# Patient Record
Sex: Male | Born: 1950 | Race: White | Hispanic: No | Marital: Married | Smoking: Never smoker
Health system: Southern US, Community
[De-identification: ages and names within clinical notes are randomized; demographics above are authoritative.]

## PROBLEM LIST (undated history)

## (undated) DIAGNOSIS — G709 Myoneural disorder, unspecified: Secondary | ICD-10-CM

## (undated) DIAGNOSIS — I10 Essential (primary) hypertension: Secondary | ICD-10-CM

## (undated) DIAGNOSIS — E871 Hypo-osmolality and hyponatremia: Secondary | ICD-10-CM

## (undated) DIAGNOSIS — M1990 Unspecified osteoarthritis, unspecified site: Secondary | ICD-10-CM

## (undated) DIAGNOSIS — M21371 Foot drop, right foot: Secondary | ICD-10-CM

## (undated) DIAGNOSIS — G912 (Idiopathic) normal pressure hydrocephalus: Secondary | ICD-10-CM

## (undated) DIAGNOSIS — E7800 Pure hypercholesterolemia, unspecified: Secondary | ICD-10-CM

## (undated) DIAGNOSIS — M21372 Foot drop, left foot: Secondary | ICD-10-CM

## (undated) DIAGNOSIS — H269 Unspecified cataract: Secondary | ICD-10-CM

## (undated) DIAGNOSIS — K219 Gastro-esophageal reflux disease without esophagitis: Secondary | ICD-10-CM

## (undated) DIAGNOSIS — N400 Enlarged prostate without lower urinary tract symptoms: Secondary | ICD-10-CM

## (undated) DIAGNOSIS — C9110 Chronic lymphocytic leukemia of B-cell type not having achieved remission: Secondary | ICD-10-CM

## (undated) DIAGNOSIS — E109 Type 1 diabetes mellitus without complications: Secondary | ICD-10-CM

## (undated) DIAGNOSIS — E119 Type 2 diabetes mellitus without complications: Secondary | ICD-10-CM

## (undated) HISTORY — PX: CYSTOSCOPY WITH INSERTION OF UROLIFT: SHX6678

## (undated) HISTORY — PX: BACK SURGERY: SHX140

## (undated) HISTORY — PX: EYE SURGERY: SHX253

## (undated) HISTORY — PX: LUMBAR FUSION: SHX111

## (undated) HISTORY — DX: Unspecified cataract: H26.9

## (undated) HISTORY — PX: APPENDECTOMY: SHX54

## (undated) HISTORY — DX: Myoneural disorder, unspecified: G70.9

## (undated) HISTORY — PX: SPINE SURGERY: SHX786

## (undated) HISTORY — PX: CATARACT EXTRACTION: SUR2

## (undated) HISTORY — PX: BRAIN SURGERY: SHX531

## (undated) HISTORY — PX: CYST REMOVAL NECK: SHX6281

## (undated) HISTORY — PX: VENTRICULOPERITONEAL SHUNT: SHX204

---

## 2000-09-17 ENCOUNTER — Encounter: Admission: RE | Admit: 2000-09-17 | Discharge: 2000-12-16 | Payer: Self-pay | Admitting: *Deleted

## 2001-06-26 ENCOUNTER — Emergency Department (HOSPITAL_COMMUNITY): Admission: EM | Admit: 2001-06-26 | Discharge: 2001-06-27 | Payer: Self-pay | Admitting: Emergency Medicine

## 2002-09-04 ENCOUNTER — Encounter: Payer: Self-pay | Admitting: Emergency Medicine

## 2002-09-04 ENCOUNTER — Emergency Department (HOSPITAL_COMMUNITY): Admission: EM | Admit: 2002-09-04 | Discharge: 2002-09-05 | Payer: Self-pay | Admitting: Emergency Medicine

## 2002-09-27 ENCOUNTER — Encounter: Admission: RE | Admit: 2002-09-27 | Discharge: 2002-12-26 | Payer: Self-pay | Admitting: Family Medicine

## 2002-10-09 ENCOUNTER — Ambulatory Visit (HOSPITAL_COMMUNITY): Admission: RE | Admit: 2002-10-09 | Discharge: 2002-10-09 | Payer: Self-pay | Admitting: Neurological Surgery

## 2002-10-09 ENCOUNTER — Encounter: Payer: Self-pay | Admitting: Neurological Surgery

## 2003-01-09 ENCOUNTER — Ambulatory Visit (HOSPITAL_COMMUNITY): Admission: RE | Admit: 2003-01-09 | Discharge: 2003-01-09 | Payer: Self-pay | Admitting: Neurological Surgery

## 2003-01-09 ENCOUNTER — Encounter: Payer: Self-pay | Admitting: Neurological Surgery

## 2004-04-20 DIAGNOSIS — G912 (Idiopathic) normal pressure hydrocephalus: Secondary | ICD-10-CM

## 2004-04-20 HISTORY — DX: (Idiopathic) normal pressure hydrocephalus: G91.2

## 2004-04-20 HISTORY — PX: VENTRICULOPERITONEAL SHUNT: SHX204

## 2004-05-21 ENCOUNTER — Ambulatory Visit (HOSPITAL_COMMUNITY): Admission: RE | Admit: 2004-05-21 | Discharge: 2004-05-21 | Payer: Self-pay | Admitting: Gastroenterology

## 2004-10-29 ENCOUNTER — Inpatient Hospital Stay (HOSPITAL_COMMUNITY): Admission: EM | Admit: 2004-10-29 | Discharge: 2004-11-01 | Payer: Self-pay | Admitting: Emergency Medicine

## 2004-11-09 ENCOUNTER — Inpatient Hospital Stay (HOSPITAL_COMMUNITY): Admission: EM | Admit: 2004-11-09 | Discharge: 2004-11-18 | Payer: Self-pay | Admitting: Emergency Medicine

## 2006-09-13 ENCOUNTER — Emergency Department (HOSPITAL_COMMUNITY): Admission: EM | Admit: 2006-09-13 | Discharge: 2006-09-13 | Payer: Self-pay | Admitting: Emergency Medicine

## 2009-04-05 ENCOUNTER — Inpatient Hospital Stay (HOSPITAL_COMMUNITY): Admission: RE | Admit: 2009-04-05 | Discharge: 2009-04-09 | Payer: Self-pay | Admitting: Neurological Surgery

## 2009-04-10 ENCOUNTER — Inpatient Hospital Stay (HOSPITAL_COMMUNITY): Admission: EM | Admit: 2009-04-10 | Discharge: 2009-04-12 | Payer: Self-pay | Admitting: Emergency Medicine

## 2010-02-10 ENCOUNTER — Emergency Department (HOSPITAL_COMMUNITY): Admission: EM | Admit: 2010-02-10 | Discharge: 2010-02-11 | Payer: Self-pay | Admitting: Emergency Medicine

## 2010-05-23 ENCOUNTER — Emergency Department (HOSPITAL_COMMUNITY)
Admission: EM | Admit: 2010-05-23 | Discharge: 2010-05-24 | Disposition: A | Discharge status: Home / Self Care | Payer: 59 | Attending: Emergency Medicine | Admitting: Emergency Medicine

## 2010-05-23 ENCOUNTER — Emergency Department (HOSPITAL_COMMUNITY): Payer: 59

## 2010-05-23 DIAGNOSIS — R6883 Chills (without fever): Secondary | ICD-10-CM | POA: Insufficient documentation

## 2010-05-23 DIAGNOSIS — I10 Essential (primary) hypertension: Secondary | ICD-10-CM | POA: Insufficient documentation

## 2010-05-23 DIAGNOSIS — E7800 Pure hypercholesterolemia, unspecified: Secondary | ICD-10-CM | POA: Insufficient documentation

## 2010-05-23 DIAGNOSIS — R112 Nausea with vomiting, unspecified: Secondary | ICD-10-CM | POA: Insufficient documentation

## 2010-05-23 DIAGNOSIS — E119 Type 2 diabetes mellitus without complications: Secondary | ICD-10-CM | POA: Insufficient documentation

## 2010-05-23 DIAGNOSIS — Z982 Presence of cerebrospinal fluid drainage device: Secondary | ICD-10-CM | POA: Insufficient documentation

## 2010-05-23 DIAGNOSIS — R42 Dizziness and giddiness: Secondary | ICD-10-CM | POA: Insufficient documentation

## 2010-05-23 LAB — DIFFERENTIAL
Basophils Absolute: 0 10*3/uL (ref 0.0–0.1)
Lymphocytes Relative: 14 % (ref 12–46)

## 2010-05-23 LAB — POCT CARDIAC MARKERS
CKMB, poc: 6.2 ng/mL (ref 1.0–8.0)
Myoglobin, poc: 91.4 ng/mL (ref 12–200)
Troponin i, poc: <0.05 ng/mL (ref 0.00–0.09)

## 2010-05-23 LAB — GLUCOSE, CAPILLARY
Glucose-Capillary: 170 mg/dL — ABNORMAL HIGH (ref 70–99)
Glucose-Capillary: 100 mg/dL — ABNORMAL HIGH (ref 70–99)

## 2010-05-23 LAB — COMPREHENSIVE METABOLIC PANEL
Alkaline Phosphatase: 43 U/L (ref 39–117)
Chloride: 101 mEq/L (ref 96–112)
Potassium: 4 mEq/L (ref 3.5–5.1)
Sodium: 135 mEq/L (ref 135–145)
Total Bilirubin: 0.4 mg/dL (ref 0.3–1.2)

## 2010-05-23 LAB — CBC
MCV: 90.3 fL (ref 78.0–100.0)
RDW: 12.3 % (ref 11.5–15.5)
WBC: 7.7 10*3/uL (ref 4.0–10.5)

## 2010-05-26 LAB — POCT I-STAT, CHEM 8
Chloride: 102 mEq/L (ref 96–112)
HCT: 39 % (ref 39.0–52.0)
Hemoglobin: 13.3 g/dL (ref 13.0–17.0)

## 2010-07-02 LAB — CSF CULTURE W GRAM STAIN

## 2010-07-02 LAB — PROTEIN, CSF: Total  Protein, CSF: 142 mg/dL — ABNORMAL HIGH (ref 15–45)

## 2010-07-02 LAB — CSF CELL COUNT WITH DIFFERENTIAL
RBC Count, CSF: 330 /mm3 — ABNORMAL HIGH
WBC, CSF: 1 /mm3 (ref 0–5)
WBC, CSF: 4 /mm3 (ref 0–5)

## 2010-07-02 LAB — BASIC METABOLIC PANEL
CO2: 25 mEq/L (ref 19–32)
Chloride: 100 mEq/L (ref 96–112)
GFR calc Af Amer: >60 mL/min (ref >60)
Potassium: 4.2 mEq/L (ref 3.5–5.1)
Sodium: 132 mEq/L — ABNORMAL LOW (ref 135–145)

## 2010-07-02 LAB — GLUCOSE, CSF: Glucose, CSF: 59 mg/dL (ref 43–76)

## 2010-07-02 LAB — CBC
Hemoglobin: 12.6 g/dL — ABNORMAL LOW (ref 13.0–17.0)
MCH: 31.6 pg (ref 26.0–34.0)
MCV: 91.5 fL (ref 78.0–100.0)
RBC: 3.99 MIL/uL — ABNORMAL LOW (ref 4.22–5.81)

## 2010-07-02 LAB — DIFFERENTIAL
Eosinophils Absolute: 0 10*3/uL (ref 0.0–0.7)
Eosinophils Relative: 0 % (ref 0–5)
Lymphocytes Relative: 4 % — ABNORMAL LOW (ref 12–46)
Lymphs Abs: 0.6 10*3/uL — ABNORMAL LOW (ref 0.7–4.0)
Monocytes Relative: 3 % (ref 3–12)

## 2010-07-02 LAB — GLUCOSE, CAPILLARY: Glucose-Capillary: 124 mg/dL — ABNORMAL HIGH (ref 70–99)

## 2010-07-21 LAB — COMPREHENSIVE METABOLIC PANEL
ALT: 25 U/L (ref 0–53)
AST: 25 U/L (ref 0–37)
AST: 30 U/L (ref 0–37)
Albumin: 2.6 g/dL — ABNORMAL LOW (ref 3.5–5.2)
Alkaline Phosphatase: 41 U/L (ref 39–117)
CO2: 29 mEq/L (ref 19–32)
Calcium: 8 mg/dL — ABNORMAL LOW (ref 8.4–10.5)
Chloride: 89 mEq/L — ABNORMAL LOW (ref 96–112)
Creatinine, Ser: 1 mg/dL (ref 0.4–1.5)
GFR calc Af Amer: >60 mL/min (ref >60)
GFR calc Af Amer: >60 mL/min (ref >60)
Glucose, Bld: 160 mg/dL — ABNORMAL HIGH (ref 70–99)
Potassium: 4.1 mEq/L (ref 3.5–5.1)
Potassium: 4.2 mEq/L (ref 3.5–5.1)
Sodium: 138 mEq/L (ref 135–145)
Total Bilirubin: 1.1 mg/dL (ref 0.3–1.2)
Total Protein: 4.7 g/dL — ABNORMAL LOW (ref 6.0–8.3)
Total Protein: 5.4 g/dL — ABNORMAL LOW (ref 6.0–8.3)

## 2010-07-21 LAB — BASIC METABOLIC PANEL
CO2: 28 mEq/L (ref 19–32)
Calcium: 7.7 mg/dL — ABNORMAL LOW (ref 8.4–10.5)
Creatinine, Ser: 1 mg/dL (ref 0.4–1.5)
GFR calc Af Amer: >60 mL/min (ref >60)
GFR calc non Af Amer: >60 mL/min (ref >60)
Sodium: 131 mEq/L — ABNORMAL LOW (ref 135–145)

## 2010-07-21 LAB — GLUCOSE, CAPILLARY
Glucose-Capillary: 119 mg/dL — ABNORMAL HIGH (ref 70–99)
Glucose-Capillary: 119 mg/dL — ABNORMAL HIGH (ref 70–99)
Glucose-Capillary: 130 mg/dL — ABNORMAL HIGH (ref 70–99)
Glucose-Capillary: 133 mg/dL — ABNORMAL HIGH (ref 70–99)
Glucose-Capillary: 136 mg/dL — ABNORMAL HIGH (ref 70–99)
Glucose-Capillary: 138 mg/dL — ABNORMAL HIGH (ref 70–99)
Glucose-Capillary: 139 mg/dL — ABNORMAL HIGH (ref 70–99)
Glucose-Capillary: 147 mg/dL — ABNORMAL HIGH (ref 70–99)
Glucose-Capillary: 149 mg/dL — ABNORMAL HIGH (ref 70–99)
Glucose-Capillary: 154 mg/dL — ABNORMAL HIGH (ref 70–99)
Glucose-Capillary: 167 mg/dL — ABNORMAL HIGH (ref 70–99)
Glucose-Capillary: 167 mg/dL — ABNORMAL HIGH (ref 70–99)
Glucose-Capillary: 179 mg/dL — ABNORMAL HIGH (ref 70–99)
Glucose-Capillary: 187 mg/dL — ABNORMAL HIGH (ref 70–99)
Glucose-Capillary: 253 mg/dL — ABNORMAL HIGH (ref 70–99)
Glucose-Capillary: 301 mg/dL — ABNORMAL HIGH (ref 70–99)
Glucose-Capillary: 317 mg/dL — ABNORMAL HIGH (ref 70–99)
Glucose-Capillary: 323 mg/dL — ABNORMAL HIGH (ref 70–99)

## 2010-07-21 LAB — CBC
Hemoglobin: 7.6 g/dL — ABNORMAL LOW (ref 13.0–17.0)
MCHC: 34.4 g/dL (ref 30.0–36.0)
MCHC: 34.4 g/dL (ref 30.0–36.0)
MCV: 94.8 fL (ref 78.0–100.0)
MCV: 95.5 fL (ref 78.0–100.0)
Platelets: 155 10*3/uL (ref 150–400)
Platelets: 171 10*3/uL (ref 150–400)
RBC: 2.32 MIL/uL — ABNORMAL LOW (ref 4.22–5.81)
RDW: 12.2 % (ref 11.5–15.5)
WBC: 9 10*3/uL (ref 4.0–10.5)
WBC: 9.2 10*3/uL (ref 4.0–10.5)

## 2010-07-21 LAB — CARDIAC PANEL(CRET KIN+CKTOT+MB+TROPI)
CK, MB: 2.7 ng/mL (ref 0.3–4.0)
Relative Index: 1.2 (ref 0.0–2.5)
Troponin I: 0.02 ng/mL (ref 0.00–0.06)
Troponin I: 0.03 ng/mL (ref 0.00–0.06)

## 2010-07-21 LAB — DIFFERENTIAL
Basophils Absolute: 0 10*3/uL (ref 0.0–0.1)
Eosinophils Relative: 0 % (ref 0–5)
Lymphocytes Relative: 5 % — ABNORMAL LOW (ref 12–46)
Monocytes Absolute: 0.6 10*3/uL (ref 0.1–1.0)
Monocytes Relative: 7 % (ref 3–12)

## 2010-07-21 LAB — POCT I-STAT GLUCOSE
Glucose, Bld: 84 mg/dL (ref 70–99)
Glucose, Bld: 86 mg/dL (ref 70–99)
Operator id: 173791
Operator id: 173791
Operator id: 177181

## 2010-07-21 LAB — POCT I-STAT 4, (NA,K, GLUC, HGB,HCT)
Glucose, Bld: 294 mg/dL — ABNORMAL HIGH (ref 70–99)
HCT: 28 % — ABNORMAL LOW (ref 39.0–52.0)
Hemoglobin: 9.5 g/dL — ABNORMAL LOW (ref 13.0–17.0)
Potassium: 4.4 mEq/L (ref 3.5–5.1)
Sodium: 133 mEq/L — ABNORMAL LOW (ref 135–145)

## 2010-07-21 LAB — CK TOTAL AND CKMB (NOT AT ARMC): CK, MB: 3.5 ng/mL (ref 0.3–4.0)

## 2010-07-21 LAB — CULTURE, BLOOD (ROUTINE X 2): Culture: NO GROWTH

## 2010-07-21 LAB — URINALYSIS, ROUTINE W REFLEX MICROSCOPIC
Ketones, ur: >80 mg/dL — AB
Nitrite: NEGATIVE
Protein, ur: NEGATIVE mg/dL

## 2010-07-22 LAB — BASIC METABOLIC PANEL
CO2: 29 mEq/L (ref 19–32)
Chloride: 99 mEq/L (ref 96–112)
Creatinine, Ser: 0.99 mg/dL (ref 0.4–1.5)
GFR calc Af Amer: >60 mL/min (ref >60)
Potassium: 4.4 mEq/L (ref 3.5–5.1)

## 2010-07-22 LAB — CBC
HCT: 40.2 % (ref 39.0–52.0)
Hemoglobin: 14.1 g/dL (ref 13.0–17.0)
MCHC: 35 g/dL (ref 30.0–36.0)
MCV: 94.7 fL (ref 78.0–100.0)
RBC: 4.24 MIL/uL (ref 4.22–5.81)
WBC: 7.3 10*3/uL (ref 4.0–10.5)

## 2010-07-22 LAB — ABO/RH: ABO/RH(D): A POS

## 2010-09-05 NOTE — Consult Note (Signed)
NAME:  MALCOMB, GANGEMI NO.:  [id number]   MEDICAL RECORD NO.:  [id number]          PATIENT TYPE:  INP   LOCATION:  3021                         FACILITY:  [facility]   PHYSICIAN:  Cristi Loron, M.D.DATE OF BIRTH:  [birth year]   DATE OF CONSULTATION:  DATE OF DISCHARGE:                                   CONSULTATION   CHIEF COMPLAINT:  Headaches, nausea and vomiting.   HISTORY OF PRESENT ILLNESS:  The patient is a 60 year old white male who has  a history of migraine headaches.  He had an almost typical migraine headache  today, but it seems somewhat more severe.  He has had nausea and vomiting,  so much so that he presented to the emergency department.  He was treated  with medications and feels somewhat better now.  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  Presently,  the patient tells me he feels a little better but he still has a significant  headache.  He has had some dry heaves, no seizures.  The patient has a  known history of hydrocephalus and this has been followed by [name].  He had a scan back in September of 2004.   PAST MEDICAL HISTORY:  1.  Hypertension.  2.  Lumbar surgery.  3.  Diabetes mellitus.  4.  What sounds like a peroneal neuropathy.  5.  Remote history of appendicitis.  6.  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   PAST SURGICAL HISTORY:  1.  Two level in L4-5 and 5-1 ray cage fusion by Dr. Danielle Dess between five and      10 years ago.  2.  He has also had surgery.  3.  Appendectomy.  4.  Vitrectomy for detached retina.   MEDICATIONS PRIOR TO ADMISSION:  1.  Insulin pump.  2.  Minipress daily.  3.  Altace daily.  4.  Zocor daily.  He does not know the doses of these medications.   ALLERGIES:  No known drug allergies.   FAMILY HISTORY:  Noncontributory.   SOCIAL HISTORY:  Patient is  married.   REVIEW OF SYSTEMS:  Negative except as above.   PHYSICAL EXAMINATION:  GENERAL APPEARANCE:  A pleasant 60 year old white  male who complains of a headache.  HEENT:  Normocephalic and atraumatic.  He has a prostatic left eye.  His  right pupil is round and reactive.  His right extraocular muscle is intact.  Oropharynx benign.  Uvula benign.  NECK:  Supple.  There are no masses, deformities, JVD, tracheal deviation.  CHEST:  Thorax is symmetric.  LUNGS:  Clear to auscultation.  CARDIOVASCULAR:  Regular rate and rhythm.  ABDOMEN:  Soft and nontender.  EXTREMITIES:  No obvious deformities.  NEUROLOGIC:  The patient is alert and oriented x3.  Cranial nerves II-XII  are examined bilaterally and grossly normal except he is blind in his left  eye secondary to his vitrectomy as above.  His motor strength is 5/5  in  biceps, triceps hand grip, psoas, quadriceps, gastrocnemius.  He has some  weakness of extensor hallucis longus at approximately 3 to 4+/5.  Sensory  exam demonstrates no gross abnormalities.  Cerebellar function is intact to  rapid alternating movements of the upper extremities bilaterally.  Deep  tendon reflexes are symmetric.   IMAGING STUDIES:  I reviewed the patient's cranial CT scan performed without  contrast at 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   ASSESSMENT/PLAN:  1.  Hydrocephalus.  I discussed procedure with the patient and his wife and      I have answered their questions.  I have recommended that we admit him      and I will let Dr. Danielle Dess know that he has been admitted and he can      decide what the best course of action for hydrocephalus is; i.e., when      to put a shunt in.  2.  Migraine headaches.  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      Cristi Loron, M.D.  Electronically Signed     JDJ/MEDQ  D:  10/30/2004  T:  10/30/2004  Job:  [id number]

## 2010-09-05 NOTE — Op Note (Signed)
NAME:  Christopher Conner, HUMBER NO.:  [id number]   MEDICAL RECORD NO.:  [id number]          PATIENT TYPE:  INP   LOCATION:  3110                         FACILITY:  [facility]   PHYSICIAN:  [name], M.D.DATE OF BIRTH:  [birth year]   DATE OF PROCEDURE:  11/09/2004  DATE OF DISCHARGE:                                 OPERATIVE REPORT   PREOPERATIVE DIAGNOSIS:  Hydrocephalus.   POSTOPERATIVE DIAGNOSIS:  Hydrocephalus.   PROCEDURE:  Reprogramming of Codman programmable ventriculoperitoneal shunt.   SURGEON:  The patient is a 60 year old man 10 days status post creation of a  right occipital ventriculoperitoneal shunt by [name]. It was set  at an opening pressure of 110 mmHg. The patient presented today with shunt  malfunction. CT scan revealed hydrocephalus and a decision was made to  reprogram the shunt. The patient earlier had undergone ventriculoperitoneal  shunt tapping.   PROCEDURE IN DETAIL:  At the patient's bedside, the reservoir valve assembly  of the shunt was identified. The shunt reprogrammer was set to reprogram the  shunt to open at 90 mmHg. The device was positioned over the valve in the  proper direction of flow and reprogrammed to open at 90 mmHg. The procedure  was tolerated well.       RWN/MEDQ  D:  11/09/2004  T:  11/10/2004  Job:  [id number]

## 2010-09-05 NOTE — Op Note (Signed)
NAME:  Christopher Conner, POUND NO.:  [id number]   MEDICAL RECORD NO.:  [id number]          PATIENT TYPE:  INP   LOCATION:  3110                         FACILITY:  [facility]   PHYSICIAN:  [name], M.D.DATE OF BIRTH:  [birth year]   DATE OF PROCEDURE:  11/09/2004  DATE OF DISCHARGE:                                 OPERATIVE REPORT   PREOPERATIVE DIAGNOSIS:  Hydrocephalus.   POSTOPERATIVE DIAGNOSIS:  Hydrocephalus.   DATE OF PROCEDURE:  Tapping of ventriculoperitoneal shunt.   SURGEON:  [name], M.D.   INDICATIONS:  The patient is a 60 year old status post right occipital  ventriculoperitoneal shunting by [name]. The patient has developed  headaches, nausea, and vomiting over the past 18 hours. CT scan shows  prominent ventricular system with hydrocephalus. Decision was made to  proceed with tapping of the shunt to decompress the ventricular system and  also obtain CSF for laboratories.   PROCEDURE:  After the patient's bedside is placed in the left side down  lateral position, the valve was identified and the overlying scalp was  prepped with Betadine solution and draped in a sterile fashion. A 23-gauge  butterfly was gently passed into the shunt reservoir and we were able to  easily aspirate cerebrospinal fluid. A total of 40 mL of cerebrospinal fluid  was gently aspirated. It had a light pink and mildly xanthochromic  appearance. The fluid was sent for glucose, protein, cell count, Gram stain,  and culture and sensitivity, and the results are pending. Accu-Chek done at  the time of the procedure was 268. The procedure was tolerated well and  subsequently the patient's condition improved dramatically with relief of  both his headache and resolution of his vomiting.       RWN/MEDQ  D:  11/09/2004  T:  11/10/2004  Job:  [id number]

## 2010-09-05 NOTE — Op Note (Signed)
NAME:  Christopher Conner, Christopher Conner NO.:  [id number]   MEDICAL RECORD NO.:  [id number]          PATIENT TYPE:  INP   LOCATION:  3110                         FACILITY:  [facility]   PHYSICIAN:  [name], M.D.DATE OF BIRTH:  [birth year]   DATE OF PROCEDURE:  11/10/2004  DATE OF DISCHARGE:                                 OPERATIVE REPORT   PREOPERATIVE DIAGNOSIS:  Ventriculoperitoneal shunt malfunction.   POSTOPERATIVE DIAGNOSIS:  Ventriculoperitoneal shunt malfunction.   PROCEDURE:  Revision of distal ventriculoperitoneal shunt catheter.   CO-SURGEONS:  [name], M.D. and Violeta Gelinas, M.D.   ANESTHESIA:  General endotracheal.   INDICATIONS:  The patient is a 60 year old man 10 days status post creation  of a right occipital ventriculoperitoneal shunt by [name]. The  patient had been doing well up until about 30 hours prior to surgery when he  began to have difficulties with headaches, nausea, and vomiting. The patient  presented and was evaluated in the emergency room. The shunt was tapped and  good flow was encountered. CSF showed no white blood cells. Gram stain  showed no organisms. Abdominal x-ray showed the distal catheter to be coiled  up near the abdominal incision and it is suspected that the catheter had  migrated out of the peritoneal cavity and into the extraperitoneal space  which lead to the shunt failure.   Dr. Danielle Dess and Dr. Franky Macho who had done the creation of the  ventriculoperitoneal shunt had found a lot of scarring at the initial  surgery and, therefore, I consulted Dr. Violeta Gelinas from the general  surgery service for assistance in this shunt revision and, in particular,  exposure of the peritoneal cavity.   PROCEDURE IN DETAIL:  The patient was brought to the operating room and  placed under general endotracheal anesthesia. The right scalp, neck, chest,  and abdomen were prepped with Betadine soap and solution and draped  in a  sterile fashion. The previous right abdominal incision was reopened  carefully dissecting through the subcuticular and subcutaneous tissue. The  shunt catheter was identified. We opened the Vicryl sutures that had closed  the anterior rectus sheath and we identified the posterior rectus sheath and  we could see the coiled distal tubing. This was brought out through our  exposure. Good CSF flow was seen from the end of the distal catheter. We  then closed the posterior rectus sheath opening with interrupted 2-0 Vicryl  sutures and then a new opening through the posterior rectus sheath was  created. Dissection was carried down to the peritoneum which was carefully  opened and the testis identified we clearly had the peritoneal cavity  exposed. A 3-0 silk pursestring suture was placed around the opening and  then we gently passed the distal catheter into the peritoneal cavity. The  pursestring suture was gently tied and secured. Then, we closed the anterior  rectus sheath with interrupted 2-0 undyed Vicryl sutures. The subcutaneous  layer was closed with interrupted inverted 2-0 Vicryl sutures. The  subcuticular was closed with interrupted inverted 3-0 Vicryl sutures. The  skin was  closed with surgical staples. The wound was dressed with Adaptic  and sterile gauze  and Hypafix. The procedure was tolerated well.  The estimated blood loss was  less than 25 mL. Sponge count was correct. Following surgery, the patient is  to be reversed from anesthetic, extubated, and transferred to the intensive  care unit for further care.       RWN/MEDQ  D:  11/10/2004  T:  11/10/2004  Job:  [id number]

## 2010-09-05 NOTE — Op Note (Signed)
NAME:  Christopher Conner, Christopher Conner                ACCOUNT NO.:  [id number]   MEDICAL RECORD NO.:  [id number]          PATIENT TYPE:  AMB   LOCATION:  ENDO                         FACILITY:  [facility]   PHYSICIAN:  Anselmo Rod, M.D.  DATE OF BIRTH:  Jan 13, 1951   DATE OF PROCEDURE:  05/21/2004  DATE OF DISCHARGE:                                 OPERATIVE REPORT   PROCEDURE PERFORMED:  Screening colonoscopy.   ENDOSCOPIST:  Charna Elizabeth, M.D.   INSTRUMENT USED:  Olympus video colonoscope.   INDICATIONS FOR PROCEDURE:  The patient is a 60 year old white male with a  history of brittle diabetes undergoing a screening colonoscopy.  The patient  has had recent change in bowel habits with worsening constipation and  occasional bright red blood per rectum.  Rule out colonic polyps, masses,  etc.   PREPROCEDURE PREPARATION:  Informed consent was procured from the patient.  The patient was fasted for eight hours prior to the procedure and prepped  with a bottle of magnesium citrate and a gallon of GoLYTELY the night prior  to the procedure.  The risks and benefits of the procedure including a 10%  miss rate for colon polyps or cancers was discussed with the patient as  well.   PREPROCEDURE PHYSICAL:  The patient had stable vital signs.  Neck supple.  Chest clear to auscultation.  S1 and S2 regular.  Abdomen soft with port  present for insulin pump on the anterior abdominal wall.   DESCRIPTION OF PROCEDURE:  The patient was placed in left lateral decubitus  position and sedated with 75 mg of Demerol and 7.5 mg of Versed in slow  incremental doses.  Once the patient was adequately sedated and maintained  on low flow oxygen and continuous cardiac monitoring, the Olympus video  colonoscope was advanced from the rectum to the cecum with extreme  difficulty as the patient had a very poor prep.  The patient's position was  changed from the left lateral to the supine and the right lateral position  with gentle  application of abdominal pressure to reach the cecum.  Multiple  washes were done.  However, there was a large amount of solid stool in the  cecal base and right side of the colon and the procedure had to be aborted  at this point with plans to reprep and redo the procedure in the morning.  Today the patient will be maintained on clear liquids all day and reprepped  tonight for Dr. Elnoria Howard to repeat a colonoscopy tomorrow.  The patient tolerated the procedure well without complication.   IMPRESSION:  Incomplete colonoscopy due to a poor prep.   RECOMMENDATIONS:  Reprep and redo in AM [to be done by Dr. Elnoria Howard      JNM/MEDQ  D:  05/21/2004  T:  05/21/2004  Job:  [id number]   cc:   Teena Irani. Arlyce Dice, M.D.  [address]  [place]  [place] [address]  Fax: [phone]

## 2010-09-05 NOTE — H&P (Signed)
NAME:  Christopher Conner, Christopher Conner NO.:  [id number]   MEDICAL RECORD NO.:  [id number]          PATIENT TYPE:  INP   LOCATION:  3110                         FACILITY:  [facility]   PHYSICIAN:  [name], M.D.DATE OF BIRTH:  April 24, 1950   DATE OF ADMISSION:  11/09/2004  DATE OF DISCHARGE:                                HISTORY & PHYSICAL   HISTORY OF PRESENT ILLNESS:  The patient is a 60 year old right-handed white  male who is a patient of Stefani Dama, M.D.  He is 10 days status post  creation of a right occipital ventricular peritoneal shunt by Dr. Danielle Dess.  He had seen Dr. Danielle Dess 4 days ago for suture removal and had been doing  well.   Last night, the patient developed bitemporal headache.  The pain was worse  this morning and he developed nausea and vomiting.  His wife contacted me  and I recommended an evaluation in the emergency room.  Orders were called  in.   The patient was found in the emergency room, writhing in pain and vomiting.  After a quick evaluation, his shunt was tapped for 40 mL of cerebrospinal  fluid and the headache and vomiting resolved.   Details of his history preceding the ventricular peritoneal shunting are  included in Cristi Loron, M.D. admission note from October 29, 2004.   PAST MEDICAL HISTORY:  Notable for insulin-dependent diabetes mellitus since  age 59 (41 years ago), history of hypertension for 20 years, history of  hypercholesterolemia for 10 years.  There is apparently no history of  myocardial infarction, cancer, stroke, peptic ulcer disease, or lung  disease.   PAST SURGICAL HISTORY:  Ventricular peritoneal shunting done by Dr. Danielle Dess  10 days ago.  Also right shoulder surgery.  Appendectomy and an L5 Bronson Curb  procedure with an L5-S1 fusion by Dr. Danielle Dess in 1997.   ALLERGIES:  He denies allergies to medications.   ADMISSION MEDICATIONS:  1.  Insulin pump which the patient regulates.  2.  Norvasc 10 mg q.p.m.  3.   Minipress 5 mg b.i.d.  4.  Altace 10 mg q.p.m.  5.  Zocor 40 mg q.p.m.  6.  Ibuprofen three tablets three to four times a day.  7.  Aspirin 81 mg daily.   FAMILY HISTORY:  His mother is age 11 with lung disease, she is a heavy  smoker.  Father died at age 31 of complications of non-insulin dependent  diabetes mellitus.   SOCIAL HISTORY:  The patient is married.  He works as an Art gallery manager for [employer].  He does not smoke, drink alcoholic beverages, or have a  history of substance abuse.   REVIEW OF SYSTEMS:  Notable for those described in his history of present  illness and past medical history, but is notable for a prosthetic left eye.  Otherwise unremarkable.   PHYSICAL EXAMINATION:  GENERAL:  A well-developed, well-nourished, white  male who is much more comfortable after his shunt tap.  VITAL SIGNS:  Temperature 97.8, pulse 88, blood pressure 166/76, respiratory  rate 18.  LUNGS:  Clear to auscultation.  He has symmetrical expiratory excursion.  HEART:  Regular rate and rhythm with S1 and S2.  There is no murmur.  ABDOMEN:  Soft, nondistended, nontender.  Bowel sounds are present.  INTEGUMENT:  A well-healed right occipital as well as right upper quadrant  abdominal incisions.  EXTREMITIES:  No cyanosis, clubbing, or edema.  NEUROLOGY:  Mental status; the patient is awake, alert, fully oriented.  Following commands.  Speech is full and she has good comprehension.  Cranial  nerves show prosthetic left eye.  Right pupil is 1.5 mm, round and reactive  to light.  The extraocular movements of the right eye are intact.  Facial  movement is symmetrical.  Hearing is present bilaterally.  Palate movement  is symmetrical.  Shoulder shrug is symmetrical and tongue is midline.  Motor  examination shows 5/5 strength in the upper and lower extremities.  He has  no drift of the upper extremities.  Sensation is intact to pinprick  throughout the extremities.  Reflexes are  symmetrical, bit diminished.  Toes  are downgoing.  Gait and stance were not tested.   DIAGNOSTIC STUDIES:  CT scan of the brain without contrast shows  ventriculomegaly with right occipital ventricular catheter.  The overall  appearance is similar to the CT scan done on October 29, 2004.   LABORATORY DATA:  Done on arrival to the emergency room today showed white  blood cell count of 7.5, hemoglobin 13.9, hematocrit 40.3, platelet count  362.  Sodium 132, potassium 4.5, and glucose 232.   IMPRESSION:  Shunt malfunction, rule out shunt infection.   PLAN:  Shunt tap (done).  Reprogram of the shunt.  It is currently set at an  opening pressure of 110 mmHg.  The patient will be admitted to the  neurosurgical intensive care unit for neuro checks. He will be kept NPO  until his status stabilizes.  The patient will manage his own insulin pump.       RWN/MEDQ  D:  11/09/2004  T:  11/09/2004  Job:  [id number]

## 2010-09-05 NOTE — Op Note (Signed)
NAME:  Christopher Conner, Christopher Conner NO.:  [id number]   MEDICAL RECORD NO.:  [id number]          PATIENT TYPE:  INP   LOCATION:  3015                         FACILITY:  [facility]   PHYSICIAN:  Stefani Dama, M.D.  DATE OF BIRTH:  04-Dec-1950   DATE OF PROCEDURE:  11/17/2004  DATE OF DISCHARGE:  11/18/2004                                 OPERATIVE REPORT   PREOPERATIVE DIAGNOSIS:  Communicating hydrocephalus, status post  ventriculoperitoneal shunt placement.   POSTOPERATIVE DIAGNOSIS:  Communicating hydrocephalus, status post  ventriculoperitoneal shunt placement.   PROCEDURE:  Adjustment of ventriculoperitoneal shunt pressure.   PROCEDURE:  The patient had a Codman ventriculoperitoneal shunt placed on  July 23.  His shunt pressure was adjusted initially at 90 mmH2O.  This was  readjusted externally to 110 mmH2O without difficulty.  No confirmatory x-rays were obtained.       HJE/MEDQ  D:  11/18/2004  T:  11/19/2004  Job:  [id number]

## 2010-09-05 NOTE — Discharge Summary (Signed)
NAMEMarland Conner  [name], RASH NO.:  [id number]   MEDICAL RECORD NO.:  [id number]          PATIENT TYPE:  INP   LOCATION:  3021                         FACILITY:  [facility]   PHYSICIAN:  Stefani Dama, M.D.  DATE OF BIRTH:  06-28-1950   DATE OF ADMISSION:  10/29/2004  DATE OF DISCHARGE:                                 DISCHARGE SUMMARY   ANTICIPATED DATE OF DISCHARGE:  November 01, 2004.   ADMITTING DIAGNOSIS:  Acute communicating hydrocephalus.   ASSESSMENT:  Acute communicating hydrocephalus.   DISCHARGE CONDITION:  The condition on discharge was improving.   OPERATION PERFORMED:  Insertion of right parietal ventriculoperitoneal  shunt.   HOSPITAL COURSE:  [name] is a 60 year old individual who has had  significant problems with headaches.  He previously had a finding of  hydrocephalus, which caused significant headache, and seemed to resolve on  its own.  He was watched carefully.  He had some ventriculomegaly, but was  asymptomatic.  The patient had done well in the recent past until he  developed substantial nausea and vomiting; and, he was found to have acute  hydrocephalus again.  There is no known history of any underlying infection.  CT scan confirmed the presence of hydrocephalus.   Then the patient was taken to the operating room on the evening October 30, 2004 where he underwent placement of right parietal ventriculoperitoneal  shunt.  This was undertaken without difficulty.  Postoperatively the patient  has felt well.  He is ambulatory.  He is not having any substantial  difficulties.   DISPOSITION AND DISCHARGE MEDICATION:  The patient is discharged to home a  with prescription for Vicodin as needed for pain.   FOLLOW UP:  The patient will be seen in the office in a week's time for some  suture removal from the posterior parietal scalp.   Of note, the patient also has a history of diabetes mellitus.  He manages  his insulin quite well.  He also  has some underlying hypertension.   As noted the condition on discharge is improving.   DISCHARGE DIAGNOSES:  1.  Acute communicating hydrocephalus.  2.  Diabetes mellitus.  3.  Hypertension.       HJE/MEDQ  D:  10/31/2004  T:  11/01/2004  Job:  [id number]

## 2010-09-05 NOTE — Discharge Summary (Signed)
NAMEMarland Kitchen  ZARIF, RATHJE NO.:  [id number]   MEDICAL RECORD NO.:  [id number]          PATIENT TYPE:  INP   LOCATION:  3015                         FACILITY:  [facility]   PHYSICIAN:  Stefani Dama, M.D.  DATE OF BIRTH:  11-Feb-1951   DATE OF ADMISSION:  11/09/2004  DATE OF DISCHARGE:  11/18/2004                                 DISCHARGE SUMMARY   ADMISSION DIAGNOSIS:  Ventriculoperitoneal shunt failure.   DISCHARGE/FINAL DIAGNOSIS:  Ventriculoperitoneal shunt failure.   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 He underwent placement of  ventriculoperitoneal shunt with distal catheter placement in the right upper  quadrant. The patient seemed to do well for a period of about two weeks and  then fairly acutely he developed recurrent symptoms of headache, nausea, and  vomiting, and CT scan again demonstrated that he had a markedly distended  ventricular system with a well-placed right parietal ventriculoperitoneal  shunt catheter.  Shunt series, however, demonstrated that he had coiling of  the distal catheter in the right upper quadrant. It had suspected that the  catheter had migrated its way out of the peritoneum. The shunt had revealed  no evidence of any white cells, protein and glucose levels were completely  within the limits of normal. The patient underwent revision of the distal  catheter on the 24th with the help of general surgery. Through the same skin  incision in the right upper quadrant a second opening into the peritoneal  cavity was performed by Dr. Gabrielle Dare. Janee Morn. A catheter  was inserted into  that area. Fluid from the second wound was obtained and this was cultured  out and did not reveal growth of any organisms. Postoperatively the patient  complained of substantial amounts of pain both in his abdomen and across his  left shoulder. He was nauseated for the first three days postoperatively.  Follow-up scan demonstrated the fact that the patient has well decompressed  ventricular system. It should be noted that prior to the surgical  intervention the pressure in his shunt valve was decreased to 90 mm of  water. During the postoperative period he continued to complain of  substantial episodic headaches and intermittent pain in the abdomen and also  in the region of the left shoulder. He was ambulated during this period of  time. He had some significant problems with constipation which were  difficult to overcome with the use of laxatives, but over a period of time  he seemed to be improving slowly. Two days prior to his discharge, however,  it was felt that his pressure may be a little bit too low for him and his  pressure in the valve was changed to 110 mm of water. This did  seem to  improve his situation such that by the date of discharge the patient had  been ambulating well, was tolerating a regular diet, complaining of less  abdominal pain, and complained also minimally of headache symptoms which  were not nearly as severe as they had been a few days earlier. His incisions  were clean and dry. His staples had been removed from his abdominal wound  and at the current time he is  discharged home with a prescription for Darvocet p.r.n. pain.  He has been  advised regarding a bowel regimen, to be seen in the office in three weeks  time for further follow-up. CT scan of the brain performed on November 17, 2004,  revealed the presence of well-decompressed ventricular system with well-  placed catheter.       HJE/MEDQ  D:  11/18/2004  T:  11/18/2004  Job:   [id number]

## 2010-09-05 NOTE — Op Note (Signed)
NAME:  Christopher Conner, BOTTGER NO.:  [id number]   MEDICAL RECORD NO.:  [id number]          PATIENT TYPE:  INP   LOCATION:  3021                         FACILITY:  [facility]   PHYSICIAN:  Stefani Dama, M.D.  DATE OF BIRTH:  24-Apr-1950   DATE OF PROCEDURE:  10/30/2004  DATE OF DISCHARGE:                                 OPERATIVE REPORT   PREOPERATIVE DIAGNOSIS:  Communicating hydrocephalus.   POSTOPERATIVE DIAGNOSIS:  Communicating hydrocephalus.   PROCEDURE:  Right parieto-occipital ventriculoperitoneal shunt.   SURGEON:  Stefani Dama, M.D.   INDICATIONS:  [name] is a 60 year old individual who has had episodic  communicating hydrocephalus.  He had an episode a few years ago; however,  this seemed to resolve spontaneously and the patient had done well.  He  developed nausea and vomiting, and was found to have enlarged ventricles on  his most recent CT scan.  He is taken to the operating room to undergo  placement of a right parieto-occipital ventriculoperitoneal shunt.   PROCEDURE:  The patient was brought to the operating room and placed on the  table in a supine position.  After a smooth induction of general  endotracheal anesthesia, he had his head shaved on the right parieto-  occipital region.  The area 4 cm posterior to the ear and 3 cm above the ear  was marked and this area was infiltrated with lidocaine without epinephrine.  A vertical incision was created in this region and dissection was taken down  to the bone.  The bone was perforated using a high-speed air drill.  Then an  incision was created in the right upper quadrant and this was taken down to  the rectus sheath.  The rectus sheath was opened, the rectus muscle was  divided and then the posterior rectus sheath was opened.  The underlying  peritoneal cavity was noted be significantly loculated and dissection in the  cavity ultimately revealed the peritoneal contents.  The 2 openings were  then  connected by a subcutaneously tunneling catheter down into the anterior  chest wall.  A second incision was created on the anterior chest wall and  the ventriculoperitoneal catheter was passed from the cephalad portion down  to the chest wall; the second shunt passing was performed down to the  peritoneal opening and shunt catheter was passed into this area.  The dura  was then perforated with a 15 blade and a ventricular catheter was inserted  to a depth of 5 cm, at which point spinal fluid was obtained under  significant pressure.  The catheter was threaded into a total depth of 10 cm  and then the system was connected to the shunt valve.  The valve was pumped  to be primed and then the distal catheter was allowed to drain and it  drained cerebrospinal fluid spontaneously.  The system was then secured with  a 3-0 silk suture to secure the ventricular catheter to the shunt valve and  then the distal catheter was placed into the peritoneal cavity and the  posterior peritoneal wall was closed with silk  sutures.  The subcutaneous  tissues were closed with 2-0 Vicryl in an interrupted fashion, 3-0 Vicryl  was used subcuticularly and the skin was closed with nylon in the scalp  over the shunt insertion site.  The wounds were then dressed with Dermabond  in the right upper quadrant incision and Tegaderm dressings were placed on  the neck and on the cranial incisions.  The patient was returned to the  recovery room in stable condition.       HJE/MEDQ  D:  10/30/2004  T:  10/31/2004  Job:  [id number]

## 2011-02-06 ENCOUNTER — Other Ambulatory Visit: Payer: Self-pay | Admitting: Internal Medicine

## 2011-02-06 DIAGNOSIS — R42 Dizziness and giddiness: Secondary | ICD-10-CM

## 2011-02-12 ENCOUNTER — Other Ambulatory Visit: Payer: 59

## 2011-02-26 ENCOUNTER — Other Ambulatory Visit: Payer: 59

## 2011-03-04 ENCOUNTER — Other Ambulatory Visit: Payer: Self-pay | Admitting: Neurological Surgery

## 2011-03-04 DIAGNOSIS — R42 Dizziness and giddiness: Secondary | ICD-10-CM

## 2011-03-04 DIAGNOSIS — M47812 Spondylosis without myelopathy or radiculopathy, cervical region: Secondary | ICD-10-CM

## 2011-03-04 DIAGNOSIS — G919 Hydrocephalus, unspecified: Secondary | ICD-10-CM

## 2011-03-11 ENCOUNTER — Ambulatory Visit
Admission: RE | Admit: 2011-03-11 | Discharge: 2011-03-11 | Disposition: A | Discharge status: Home / Self Care | Payer: 59 | Source: Ambulatory Visit | Attending: Neurological Surgery | Admitting: Neurological Surgery

## 2011-03-11 ENCOUNTER — Other Ambulatory Visit: Payer: 59

## 2011-03-11 DIAGNOSIS — G919 Hydrocephalus, unspecified: Secondary | ICD-10-CM

## 2011-03-11 DIAGNOSIS — M47812 Spondylosis without myelopathy or radiculopathy, cervical region: Secondary | ICD-10-CM

## 2011-03-11 DIAGNOSIS — R42 Dizziness and giddiness: Secondary | ICD-10-CM

## 2014-04-09 ENCOUNTER — Observation Stay (HOSPITAL_COMMUNITY)
Admission: EM | Admit: 2014-04-09 | Discharge: 2014-04-10 | Disposition: A | Discharge status: Home / Self Care | Payer: 59 | Attending: Internal Medicine | Admitting: Internal Medicine

## 2014-04-09 ENCOUNTER — Encounter (HOSPITAL_COMMUNITY): Payer: Self-pay | Admitting: *Deleted

## 2014-04-09 ENCOUNTER — Emergency Department (HOSPITAL_COMMUNITY): Payer: 59

## 2014-04-09 DIAGNOSIS — K219 Gastro-esophageal reflux disease without esophagitis: Secondary | ICD-10-CM | POA: Insufficient documentation

## 2014-04-09 DIAGNOSIS — Z794 Long term (current) use of insulin: Secondary | ICD-10-CM | POA: Insufficient documentation

## 2014-04-09 DIAGNOSIS — R112 Nausea with vomiting, unspecified: Secondary | ICD-10-CM

## 2014-04-09 DIAGNOSIS — I10 Essential (primary) hypertension: Secondary | ICD-10-CM | POA: Insufficient documentation

## 2014-04-09 DIAGNOSIS — E785 Hyperlipidemia, unspecified: Secondary | ICD-10-CM | POA: Insufficient documentation

## 2014-04-09 DIAGNOSIS — E871 Hypo-osmolality and hyponatremia: Secondary | ICD-10-CM | POA: Diagnosis present

## 2014-04-09 DIAGNOSIS — M542 Cervicalgia: Secondary | ICD-10-CM

## 2014-04-09 DIAGNOSIS — R42 Dizziness and giddiness: Secondary | ICD-10-CM | POA: Insufficient documentation

## 2014-04-09 DIAGNOSIS — E119 Type 2 diabetes mellitus without complications: Secondary | ICD-10-CM | POA: Diagnosis not present

## 2014-04-09 DIAGNOSIS — G912 (Idiopathic) normal pressure hydrocephalus: Secondary | ICD-10-CM | POA: Diagnosis not present

## 2014-04-09 DIAGNOSIS — Z982 Presence of cerebrospinal fluid drainage device: Secondary | ICD-10-CM | POA: Diagnosis not present

## 2014-04-09 HISTORY — DX: Pure hypercholesterolemia, unspecified: E78.00

## 2014-04-09 HISTORY — DX: Gastro-esophageal reflux disease without esophagitis: K21.9

## 2014-04-09 HISTORY — DX: (Idiopathic) normal pressure hydrocephalus: G91.2

## 2014-04-09 HISTORY — DX: Type 2 diabetes mellitus without complications: E11.9

## 2014-04-09 HISTORY — DX: Essential (primary) hypertension: I10

## 2014-04-09 HISTORY — DX: Hypo-osmolality and hyponatremia: E87.1

## 2014-04-09 LAB — COMPREHENSIVE METABOLIC PANEL
ALT: 27 U/L (ref 0–53)
ANION GAP: 14 (ref 5–15)
AST: 34 U/L (ref 0–37)
Albumin: 4.1 g/dL (ref 3.5–5.2)
Alkaline Phosphatase: 58 U/L (ref 39–117)
BUN: 26 mg/dL — ABNORMAL HIGH (ref 6–23)
CO2: 24 mEq/L (ref 19–32)
Calcium: 9.4 mg/dL (ref 8.4–10.5)
Chloride: 90 mEq/L — ABNORMAL LOW (ref 96–112)
Creatinine, Ser: 0.83 mg/dL (ref 0.50–1.35)
GFR calc non Af Amer: >90 mL/min (ref >90)
GLUCOSE: 167 mg/dL — AB (ref 70–99)
Potassium: 4.3 mEq/L (ref 3.7–5.3)
SODIUM: 128 meq/L — AB (ref 137–147)
TOTAL PROTEIN: 7.1 g/dL (ref 6.0–8.3)
Total Bilirubin: 0.5 mg/dL (ref 0.3–1.2)

## 2014-04-09 LAB — LIPASE, BLOOD: Lipase: 20 U/L (ref 11–59)

## 2014-04-09 LAB — CBC WITH DIFFERENTIAL/PLATELET
BASOS PCT: 0 % (ref 0–1)
Basophils Absolute: 0 10*3/uL (ref 0.0–0.1)
EOS ABS: 0.1 10*3/uL (ref 0.0–0.7)
Eosinophils Relative: 1 % (ref 0–5)
HCT: 37.9 % — ABNORMAL LOW (ref 39.0–52.0)
Hemoglobin: 13.2 g/dL (ref 13.0–17.0)
Lymphocytes Relative: 34 % (ref 12–46)
Lymphs Abs: 5.1 10*3/uL — ABNORMAL HIGH (ref 0.7–4.0)
MCH: 31.8 pg (ref 26.0–34.0)
MCHC: 34.8 g/dL (ref 30.0–36.0)
MCV: 91.3 fL (ref 78.0–100.0)
MONOS PCT: 5 % (ref 3–12)
Monocytes Absolute: 0.7 10*3/uL (ref 0.1–1.0)
NEUTROS PCT: 60 % (ref 43–77)
Neutro Abs: 9 10*3/uL — ABNORMAL HIGH (ref 1.7–7.7)
PLATELETS: 268 10*3/uL (ref 150–400)
RBC: 4.15 MIL/uL — ABNORMAL LOW (ref 4.22–5.81)
RDW: 12.3 % (ref 11.5–15.5)
WBC: 14.8 10*3/uL — ABNORMAL HIGH (ref 4.0–10.5)

## 2014-04-09 LAB — URINALYSIS, ROUTINE W REFLEX MICROSCOPIC
Bilirubin Urine: NEGATIVE
Glucose, UA: NEGATIVE mg/dL
Hgb urine dipstick: NEGATIVE
Ketones, ur: 40 mg/dL — AB
Leukocytes, UA: NEGATIVE
Nitrite: NEGATIVE
PROTEIN: NEGATIVE mg/dL
Specific Gravity, Urine: 1.04 — ABNORMAL HIGH (ref 1.005–1.030)
Urobilinogen, UA: 0.2 mg/dL (ref 0.0–1.0)
pH: 5 (ref 5.0–8.0)

## 2014-04-09 LAB — TROPONIN I: Troponin I: <0.3 ng/mL (ref <0.30)

## 2014-04-09 LAB — CBG MONITORING, ED: Glucose-Capillary: 147 mg/dL — ABNORMAL HIGH (ref 70–99)

## 2014-04-09 MED ORDER — IOHEXOL 350 MG/ML SOLN
80.0000 mL | Freq: Once | INTRAVENOUS | Status: AC | PRN
  Administered 2014-04-09: 80 mL via INTRAVENOUS

## 2014-04-09 MED ORDER — ONDANSETRON HCL 4 MG/2ML IJ SOLN
4.0000 mg | Freq: Once | INTRAMUSCULAR | Status: AC
  Administered 2014-04-09: 4 mg via INTRAVENOUS
  Filled 2014-04-09: qty 2

## 2014-04-09 MED ORDER — MECLIZINE HCL 25 MG PO TABS
25.0000 mg | ORAL_TABLET | Freq: Once | ORAL | Status: AC
  Administered 2014-04-09: 25 mg via ORAL
  Filled 2014-04-09: qty 1

## 2014-04-09 MED ORDER — LORAZEPAM 2 MG/ML IJ SOLN
1.0000 mg | Freq: Once | INTRAMUSCULAR | Status: AC
  Administered 2014-04-09: 1 mg via INTRAVENOUS
  Filled 2014-04-09: qty 1

## 2014-04-09 MED ORDER — SODIUM CHLORIDE 0.9 % IV BOLUS (SEPSIS)
1000.0000 mL | Freq: Once | INTRAVENOUS | Status: AC
  Administered 2014-04-09: 1000 mL via INTRAVENOUS

## 2014-04-09 MED ORDER — ONDANSETRON 4 MG PO TBDP
8.0000 mg | ORAL_TABLET | Freq: Once | ORAL | Status: AC
  Administered 2014-04-09: 8 mg via ORAL
  Filled 2014-04-09: qty 2

## 2014-04-09 NOTE — ED Notes (Signed)
Pt completed home blood glucose testing.CBG 192; pt self administered insulin via pump. Zofran given for nausea

## 2014-04-09 NOTE — ED Notes (Signed)
Pt ambulated in room. Pt able to make 4-5 steps with assistance. Pt unsteady when ambulating. Assisted back to bed.

## 2014-04-09 NOTE — ED Notes (Signed)
Pt reports not feeling any better after fluids. Awaiting CT results.

## 2014-04-09 NOTE — Progress Notes (Signed)
Patient is aware he needs to make an appointment in AM with [facility]. Pt has a Codman shunt that will require being re calibrated. This was ok'ed by Dr Joya Salm and all this information has been conveyed to pt and ordering PAC , Marissa in ER.

## 2014-04-09 NOTE — ED Notes (Signed)
Pt in MRI; unable to lay on back due to worsening nausea. Ativan given per order in MRI. MRI tech attempting to complete scan at this time. Pt appeared pale and clammy

## 2014-04-09 NOTE — ED Notes (Signed)
Pt reports onset this am of n/v. Denies diarrhea or abd pain. Pt reports feeling lightheaded and has sensitivity to light. Has fever/chills. cbg 147 at triage.

## 2014-04-09 NOTE — ED Notes (Signed)
Dr. Goldston at bedside.  

## 2014-04-09 NOTE — ED Notes (Signed)
Marissa PA aware of need for removal of insulin pump and sensor; Dr.Goldston to speak to patient in regards to equipment being removed

## 2014-04-09 NOTE — ED Notes (Signed)
[name], pt's wife, updated on plan of care. Pt provided permission to discuss care with patient

## 2014-04-09 NOTE — ED Notes (Signed)
Pt was actively vomiting at triage, given zofran, warm blanket and tissues.

## 2014-04-09 NOTE — ED Provider Notes (Signed)
CSN: [id number]     Arrival date & time 04/09/14  1554 History   First MD Initiated Contact with Patient 04/09/14 1651     Chief Complaint  Patient presents with  . Emesis  . Fever     (Consider location/radiation/quality/duration/timing/severity/associated sxs/prior Treatment) The history is provided by the patient. No language interpreter was used.  Christopher Conner is a 63 year old male with past medical history of type 1 diabetes, cholesterol, hypertension presenting to the ED with dizziness and vomiting that started today. Patient reported that he felt just fine yesterday. Reported that approximately 10:00 AM this morning he started to have chills, reported that he was unable to focus. 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 Reported that when he is walking he feels completely unbalanced as if he is going to fall, reports that he needs to grab onto something to keep him steady. Stated that approximately 12:00 PM he was laying down and started to have nausea-reported that he started to have episodes of emesis approximately 5-6. Stated he had stomach contents the beginning but started to have dark brown emesis the last 3 times an episode of emesis. Reported that he feels unsteady and unstable, reported lightheaded. Reported generalized weakness. Stated he has been experiencing neck pain that started this morning. Denied blurred vision, sudden loss of vision, fever, unilateral weakness, numbness, tingling, difficulty breathing, chest pain, shortness of breath, back pain, headache, abdominal pain, hematochezia, melena, hematuria, dysuria, diarrhea, head injury, travels. PCP Dr. Alyson Ingles  Past Medical History  Diagnosis Date  . Diabetes mellitus without complication   . Hypertension   . High cholesterol   . Normal pressure hydrocephalus   . Hyponatremia   . GERD (gastroesophageal reflux disease)    Past Surgical History  Procedure Laterality Date   . Ventriculoperitoneal shunt    . Back surgery    . Lumbar fusion    . Appendectomy    . Cyst removal neck    . Cataract extraction      left eye   Family History  Problem Relation Age of Onset  . Hypertension Mother   . Hyperlipidemia Mother   . Diabetes Mother   . Diabetes Father    History  Substance Use Topics  . Smoking status: Never Smoker   . Smokeless tobacco: Not on file  . Alcohol Use: No    Review of Systems  Constitutional: Positive for chills and fatigue. Negative for fever and diaphoresis.  Eyes: Positive for visual disturbance.  Respiratory: Negative for cough, chest tightness and shortness of breath.   Cardiovascular: Negative for chest pain.  Gastrointestinal: Positive for nausea and vomiting. Negative for abdominal pain, diarrhea, constipation, blood in stool and anal bleeding.  Musculoskeletal: Positive for neck pain. Negative for back pain and neck stiffness.  Neurological: Positive for dizziness and light-headedness. Negative for weakness and headaches.      Allergies  Review of patient's allergies indicates no known allergies.  Home Medications   Prior to Admission medications   Medication Sig Start Date End Date Taking? Authorizing Provider  amLODipine (NORVASC) 10 MG tablet Take 10 mg by mouth daily.   Yes Historical Provider, MD  amoxicillin (AMOXIL) 500 MG capsule Take 500 mg by mouth 2 (two) times daily. For 3 days. On 2 of therapy   Yes Historical Provider, MD  HYDROcodone-acetaminophen (NORCO/VICODIN) 5-325 MG per tablet Take 1 tablet by mouth every 6 (six) hours as needed. pain 04/07/14  Yes Historical Provider,  MD  insulin lispro (HUMALOG) 100 UNIT/ML injection Inject into the skin 3 (three) times daily before meals. Cont pump   Yes Historical Provider, MD  lisinopril-hydrochlorothiazide (PRINZIDE,ZESTORETIC) 20-12.5 MG per tablet Take 2 tablets by mouth daily.   Yes Historical Provider, MD  pravastatin (PRAVACHOL) 40 MG tablet Take 40 mg  by mouth daily.   Yes Historical Provider, MD   BP 129/61 mmHg  Pulse 79  Temp(Src) 98.4 F (36.9 C) (Oral)  Resp 17  SpO2 95% Physical Exam  Constitutional: He is oriented to person, place, and time. He appears well-developed and well-nourished. No distress.  HENT:  Head: Normocephalic and atraumatic.  Negative trismus  Eyes: Conjunctivae and EOM are normal. Pupils are equal, round, and reactive to light. Right eye exhibits no discharge. Left eye exhibits no discharge.  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 Neck supple. No tracheal deviation present.  Negative neck stiffness Negative nuchal rigidity  Negative cervical lymphadenopathy Negative meningeal signs   Cardiovascular: Normal rate, regular rhythm and normal heart sounds.   Pulses:      Radial pulses are 2+ on the right side, and 2+ on the left side.       Dorsalis pedis pulses are 2+ on the right side, and 2+ on the left side.  Pulmonary/Chest: Effort normal and breath sounds normal. No respiratory distress. He has no wheezes. He has no rales.  Abdominal: Soft. Bowel sounds are normal. He exhibits no distension. There is no tenderness. There is no rebound and no guarding.  Musculoskeletal: Normal range of motion. He exhibits no edema or tenderness.  Full ROM to upper and lower extremities without difficulty noted, negative ataxia noted.  Lymphadenopathy:    He has no cervical adenopathy.  Neurological: He is alert and oriented to person, place, and time. No cranial nerve deficit. He exhibits normal muscle tone. Coordination normal.  Cranial nerves III-XII grossly intact Strength 5+/5+ to upper and lower extremities bilaterally with resistance applied, equal distribution noted Equal grip strength bilaterally Negative facial droop Negative slurred speech Negative aphasia Negative arm drift Fine motor skills intact Patient is able to bring  finger to nose bilaterally without difficulty or ataxia Heel to knee down shin normal bilaterally Gait noted to have sway, unsteady Patient follows commands well Patient responds to questions appropriately  Skin: Skin is warm and dry. No rash noted. He is not diaphoretic. No erythema.  Psychiatric: He has a normal mood and affect. His behavior is normal. Thought content normal.  Nursing note and vitals reviewed.   ED Course  Procedures (including critical care time)  Results for orders placed or performed during the hospital encounter of 04/09/14  CBC with Differential  Result Value Ref Range   WBC 14.8 (H) 4.0 - 10.5 K/uL   RBC 4.15 (L) 4.22 - 5.81 MIL/uL   Hemoglobin 13.2 13.0 - 17.0 g/dL   HCT 37.9 (L) 39.0 - 52.0 %   MCV 91.3 78.0 - 100.0 fL   MCH 31.8 26.0 - 34.0 pg   MCHC 34.8 30.0 - 36.0 g/dL   RDW 12.3 11.5 - 15.5 %   Platelets 268 150 - 400 K/uL   Neutrophils Relative % 60 43 - 77 %   Neutro Abs 9.0 (H) 1.7 - 7.7 K/uL   Lymphocytes Relative 34 12 - 46 %   Lymphs Abs 5.1 (H) 0.7 - 4.0 K/uL   Monocytes Relative 5 3 - 12 %  Monocytes Absolute 0.7 0.1 - 1.0 K/uL   Eosinophils Relative 1 0 - 5 %   Eosinophils Absolute 0.1 0.0 - 0.7 K/uL   Basophils Relative 0 0 - 1 %   Basophils Absolute 0.0 0.0 - 0.1 K/uL  Comprehensive metabolic panel  Result Value Ref Range   Sodium 128 (L) 137 - 147 mEq/L   Potassium 4.3 3.7 - 5.3 mEq/L   Chloride 90 (L) 96 - 112 mEq/L   CO2 24 19 - 32 mEq/L   Glucose, Bld 167 (H) 70 - 99 mg/dL   BUN 26 (H) 6 - 23 mg/dL   Creatinine, Ser 0.83 0.50 - 1.35 mg/dL   Calcium 9.4 8.4 - 10.5 mg/dL   Total Protein 7.1 6.0 - 8.3 g/dL   Albumin 4.1 3.5 - 5.2 g/dL   AST 34 0 - 37 U/L   ALT 27 0 - 53 U/L   Alkaline Phosphatase 58 39 - 117 U/L   Total Bilirubin 0.5 0.3 - 1.2 mg/dL   GFR calc non Af Amer >90 >90 mL/min   GFR calc Af Amer >90 >90 mL/min   Anion gap 14 5 - 15  Lipase, blood  Result Value Ref Range   Lipase 20 11 - 59 U/L  Urinalysis,  Routine w reflex microscopic  Result Value Ref Range   Color, Urine YELLOW YELLOW   APPearance CLEAR CLEAR   Specific Gravity, Urine 1.040 (H) 1.005 - 1.030   pH 5.0 5.0 - 8.0   Glucose, UA NEGATIVE NEGATIVE mg/dL   Hgb urine dipstick NEGATIVE NEGATIVE   Bilirubin Urine NEGATIVE NEGATIVE   Ketones, ur 40 (A) NEGATIVE mg/dL   Protein, ur NEGATIVE NEGATIVE mg/dL   Urobilinogen, UA 0.2 0.0 - 1.0 mg/dL   Nitrite NEGATIVE NEGATIVE   Leukocytes, UA NEGATIVE NEGATIVE  Troponin I  Result Value Ref Range   Troponin I <0.30 <0.30 ng/mL  CBG monitoring, ED  Result Value Ref Range   Glucose-Capillary 147 (H) 70 - 99 mg/dL   Comment 1 Notify RN     Labs Review Labs Reviewed  CBC WITH DIFFERENTIAL - Abnormal; Notable for the following:    WBC 14.8 (*)    RBC 4.15 (*)    HCT 37.9 (*)    Neutro Abs 9.0 (*)    Lymphs Abs 5.1 (*)    All other components within normal limits  COMPREHENSIVE METABOLIC PANEL - Abnormal; Notable for the following:    Sodium 128 (*)    Chloride 90 (*)    Glucose, Bld 167 (*)    BUN 26 (*)    All other components within normal limits  URINALYSIS, ROUTINE W REFLEX MICROSCOPIC - Abnormal; Notable for the following:    Specific Gravity, Urine 1.040 (*)    Ketones, ur 40 (*)    All other components within normal limits  CBG MONITORING, ED - Abnormal; Notable for the following:    Glucose-Capillary 147 (*)    All other components within normal limits  LIPASE, BLOOD  TROPONIN I  TROPONIN I  TROPONIN I  TROPONIN I    Imaging Review Ct Angio Head W/cm &/or Wo Cm  04/09/2014   CLINICAL DATA:  63 year old hypertensive diabetic male with history of hydrocephalus post shunt presenting with dizziness and generalized weakness with nausea and vomiting since this morning. Initial encounter.  EXAM: CT ANGIOGRAPHY HEAD AND NECK  TECHNIQUE: Multidetector CT imaging of the head and neck was performed using the standard protocol during bolus administration of  intravenous  contrast. Multiplanar CT image reconstructions and MIPs were obtained to evaluate the vascular anatomy. Carotid stenosis measurements (when applicable) are obtained utilizing NASCET criteria, using the distal internal carotid diameter as the denominator.  CONTRAST:  24mL OMNIPAQUE IOHEXOL 350 MG/ML SOLN  COMPARISON:  03/11/2011 MR brain and 05/23/2010 CT.  FINDINGS: CT HEAD  Brain: No intracranial hemorrhage.  Ventricular shunt catheter enters from the right parietal-occipital region extending through the right lateral ventricle with the tip at the level of the anterior septum pellucidum to the right of midline. The right lateral ventricle is slit-like but unchanged from the prior examination. Size of the left ventricles unchanged.  No intracranial enhancing lesion.  No CT evidence of large acute infarct. If infarct is of high clinical concern (particularly posterior fossa infarct) MR may be considered.  Paranasal sinuses: Clear.  Orbits: Left ptysis bulbi.  CTA NECK  Aortic arch: Aberrant right subclavian artery with calcification and mild to moderate narrowing proximal aspect of the right subclavian artery. Origin of the great vessels not entirely included on present exam.  Right carotid system: Moderate calcified plaque right carotid bifurcation with less than 50% diameter stenosis proximal right internal carotid artery.  Left carotid system: Mild calcified plaque left carotid bifurcation without significant stenosis.  Vertebral arteries: No significant stenosis dominant left vertebral artery.  The right vertebral artery origin appears to arise from lateral aspect of the right common carotid artery. Marked narrowing proximal right vertebral artery.  No neck mass or adenopathy noted. No apical lung lesion detected. Cervical spondylotic changes most prominent C4-5.  CTA HEAD  Anterior circulation: Calcified internal carotid artery cavernous segment bilaterally with mild narrowing. No significant stenosis of medium  or large size vessels. Branch vessel irregularity.  Posterior circulation: Moderate narrowing right vertebral artery. Left vertebral artery dominant in size. Narrowed irregular basilar artery. The small caliber of the basilar artery is partially explained by the fetal type contribution to the posterior cerebral arteries. Branch vessel irregularity.  Venous sinuses: Patent  IMPRESSION:  CT HEAD  No intracranial hemorrhage.  Ventricular shunt catheter enters extending through the right lateral ventricle. The right lateral ventricle is slit-like but unchanged from the prior examination. Size of the left ventricle is unchanged.  No intracranial enhancing lesion.  No CT evidence of large acute infarct. If infarct is of high clinical concern (particularly posterior fossa infarct) MR may be considered.  CTA NECK  Aberrant right subclavian artery with calcification and mild to moderate narrowing proximal aspect of the right subclavian artery.  Right carotid system: Moderate calcified plaque right carotid bifurcation with less than 50% diameter stenosis proximal right internal carotid artery.  Left carotid system: Mild calcified plaque left carotid bifurcation without significant stenosis.  No significant stenosis of the dominant left vertebral artery.  The right vertebral artery origin appears to arise from lateral aspect of the right common carotid artery. Marked narrowing proximal right vertebral artery.  CTA HEAD  Anterior circulation: Calcified internal carotid artery cavernous segment bilaterally with mild narrowing. No significant stenosis of medium or large size vessels. Branch vessel irregularity.  Posterior circulation: Moderate narrowing right vertebral artery. Left vertebral artery dominant in size. Narrowed irregular basilar artery. The small caliber of the basilar artery is partially explained by the fetal type contribution to the posterior cerebral arteries. Branch vessel irregularity.   Electronically Signed    By: Chauncey Cruel M.D.   On: 04/09/2014 20:01   Ct Angio Neck W/cm &/or Wo/cm  04/09/2014   CLINICAL  DATA:  63 year old hypertensive diabetic male with history of hydrocephalus post shunt presenting with dizziness and generalized weakness with nausea and vomiting since this morning. Initial encounter.  EXAM: CT ANGIOGRAPHY HEAD AND NECK  TECHNIQUE: Multidetector CT imaging of the head and neck was performed using the standard protocol during bolus administration of intravenous contrast. Multiplanar CT image reconstructions and MIPs were obtained to evaluate the vascular anatomy. Carotid stenosis measurements (when applicable) are obtained utilizing NASCET criteria, using the distal internal carotid diameter as the denominator.  CONTRAST:  84mL OMNIPAQUE IOHEXOL 350 MG/ML SOLN  COMPARISON:  03/11/2011 MR brain and 05/23/2010 CT.  FINDINGS: CT HEAD  Brain: No intracranial hemorrhage.  Ventricular shunt catheter enters from the right parietal-occipital region extending through the right lateral ventricle with the tip at the level of the anterior septum pellucidum to the right of midline. The right lateral ventricle is slit-like but unchanged from the prior examination. Size of the left ventricles unchanged.  No intracranial enhancing lesion.  No CT evidence of large acute infarct. If infarct is of high clinical concern (particularly posterior fossa infarct) MR may be considered.  Paranasal sinuses: Clear.  Orbits: Left ptysis bulbi.  CTA NECK  Aortic arch: Aberrant right subclavian artery with calcification and mild to moderate narrowing proximal aspect of the right subclavian artery. Origin of the great vessels not entirely included on present exam.  Right carotid system: Moderate calcified plaque right carotid bifurcation with less than 50% diameter stenosis proximal right internal carotid artery.  Left carotid system: Mild calcified plaque left carotid bifurcation without significant stenosis.  Vertebral  arteries: No significant stenosis dominant left vertebral artery.  The right vertebral artery origin appears to arise from lateral aspect of the right common carotid artery. Marked narrowing proximal right vertebral artery.  No neck mass or adenopathy noted. No apical lung lesion detected. Cervical spondylotic changes most prominent C4-5.  CTA HEAD  Anterior circulation: Calcified internal carotid artery cavernous segment bilaterally with mild narrowing. No significant stenosis of medium or large size vessels. Branch vessel irregularity.  Posterior circulation: Moderate narrowing right vertebral artery. Left vertebral artery dominant in size. Narrowed irregular basilar artery. The small caliber of the basilar artery is partially explained by the fetal type contribution to the posterior cerebral arteries. Branch vessel irregularity.  Venous sinuses: Patent  IMPRESSION:  CT HEAD  No intracranial hemorrhage.  Ventricular shunt catheter enters extending through the right lateral ventricle. The right lateral ventricle is slit-like but unchanged from the prior examination. Size of the left ventricle is unchanged.  No intracranial enhancing lesion.  No CT evidence of large acute infarct. If infarct is of high clinical concern (particularly posterior fossa infarct) MR may be considered.  CTA NECK  Aberrant right subclavian artery with calcification and mild to moderate narrowing proximal aspect of the right subclavian artery.  Right carotid system: Moderate calcified plaque right carotid bifurcation with less than 50% diameter stenosis proximal right internal carotid artery.  Left carotid system: Mild calcified plaque left carotid bifurcation without significant stenosis.  No significant stenosis of the dominant left vertebral artery.  The right vertebral artery origin appears to arise from lateral aspect of the right common carotid artery. Marked narrowing proximal right vertebral artery.  CTA HEAD  Anterior circulation:  Calcified internal carotid artery cavernous segment bilaterally with mild narrowing. No significant stenosis of medium or large size vessels. Branch vessel irregularity.  Posterior circulation: Moderate narrowing right vertebral artery. Left vertebral artery dominant in size. Narrowed irregular basilar  artery. The small caliber of the basilar artery is partially explained by the fetal type contribution to the posterior cerebral arteries. Branch vessel irregularity.   Electronically Signed   By: Chauncey Cruel M.D.   On: 04/09/2014 20:01   Mr Brain Wo Contrast (neuro Protocol)  04/09/2014   CLINICAL DATA:  Acute onset dizziness and vomiting, history of hydrocephalus and shunt.  EXAM: MRI HEAD WITHOUT CONTRAST  TECHNIQUE: Multiplanar, multiecho pulse sequences of the brain and surrounding structures were obtained without intravenous contrast.  COMPARISON:  CT of the head April 09, 2014 at 1831 hr and MRI of the brain March 11, 2011  FINDINGS: No reduced diffusion to suggest acute ischemia. No susceptibility artifact to suggest hemorrhage. No midline shift or mass effect. Ventriculoperitoneal shunt via RIGHT occipital burr hole, distal tip at the level the RIGHT lateral ventricle/ foramen of Monro, slightly RIGHT lateral ventricle is unchanged. No hydrocephalus. A few scattered subcentimeter supratentorial white matter T2 hyperintensities are similar.  No abnormal extra-axial fluid collections. Normal major intracranial vascular flow voids at the skull.  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 Status post RIGHT ocular on lens implant. Mild paranasal sinus mucosal thickening without air-fluid levels. Mastoid air cells are well aerated. No abnormal sellar expansion. No cerebellar tonsillar ectopia.  IMPRESSION: No acute intracranial process, specifically no acute ischemia nor hydrocephalus, similar appearance of ventricles. Ventriculoperitoneal shunt in situ.  Mild white matter changes can be seen with  chronic small vessel ischemic disease.   Electronically Signed   By: Elon Alas   On: 04/09/2014 22:57     EKG Interpretation   Date/Time:  Monday April 09 2014 17:38:22 EST Ventricular Rate:  93 PR Interval:  182 QRS Duration: 74 QT Interval:  350 QTC Calculation: 435 R Axis:   44 Text Interpretation:  Sinus rhythm Anterior infarct, old Confirmed by  Jeneen Rinks  MD, [place] ([id number]) on 04/09/2014 5:50:51 PM       5:43 PM This provider spoke with attending physician, Dr. Tyron Russell, discussed case in great detail. As per physician, recommended CT angiogram of head and neck to be performed with to rule out vertebral dissection.   8:08 PM CT angiograms of head and neck reviewed with attending physician. Patient seen and assessed by attending physician who recommends MRI of head to be performed.   8:29 PM Patient to be taken to MRI. Patient has pump - stated that there is nothing metal in the body.  Patient reported that he is not feeling any better after the Zofran, fluids and Antivert. Patient reported that the pump and the sensor are not metal and that he is able to attach and reattach these equipments.   8:57 PM This provider ordered MRI and was made aware that the patient had a VP shunt. This provider spoke with patient, secondary to this information not being placed in patient's history. Patient reported that had a VP shunt placed by Dr. Ellene Route in 2006. MRI tech spoke with Dr. Joya Salm who is on-call for neurosurgery who cleared patient for MRI, reported the patient will need the shunt to be recalibrated with an approximately 72 hours. Reported that patient, if discharged can have recalibration performed as an outpatient.   9:22 PM This provider was made aware that the patient cannot lay down flat secondary to nausea - stated that patient cannot lay down flat for MRI. Discussed case with attending physician, Dr. Tyron Russell - recommended ativan 1 mg to be administered via IV.   9:29  PM This provider and attending physician spoke with Dr. Janann Colonel regarding case. As per physician, reported that since CT angiogram of neck and head are negative and if MRI is negative patient to be admitted to internal medicinists regarding continuous dizziness and emesis without relief while in ED setting. Stated that if neurology is needed they can be consulted and happy to help.   11:35 PM Patient reassessed. Discussed imaging in great detail. Patient reported that he does not feel any better. Ambulated patient and continues to have ataxic gait and staggering with walking. Discussed examination with attending who agrees to admission for treatment.   11:47 PM This provider spoke with Dr. Georges Mouse, [facility] - discussed case, labs, imaging, vitals, ED course in great detail. Patient to be admitted to telemetry observation.  11:59 PM This provider spoke with Dr. Joya Salm, Neurosurgery on-call. Discussed that patient is to be admitted for dizziness and that MRI was performed and VP shunt needs to be recalibrated. As per physician, reported that Dr. Ellene Route will need to be called in the morning tomorrow.   12:21 AM This provider spoke with Dr. Georges Mouse, discussed with physician that Dr. Joya Salm recommended that Dr. Ellene Route be called in the morning for the shunt to be recalibrated. Dr. Georges Mouse understood.   MDM   Final diagnoses:  Neck pain  Dizziness  S/P VP shunt  Non-intractable vomiting with nausea, vomiting of unspecified type    Medications  aspirin EC tablet 325 mg (not administered)  atorvastatin (LIPITOR) tablet 80 mg (not administered)  pantoprazole (PROTONIX) EC tablet 40 mg (not administered)  lisinopril (PRINIVIL,ZESTRIL) tablet 40 mg (not administered)  atenolol (TENORMIN) tablet 12.5 mg (not administered)  ondansetron (ZOFRAN-ODT) disintegrating tablet 8 mg (8 mg Oral Given 04/09/14 1609)  sodium chloride 0.9 % bolus 1,000 mL (0 mLs Intravenous Stopped 04/09/14 1920)  ondansetron  (ZOFRAN) injection 4 mg (4 mg Intravenous Given 04/09/14 1756)  iohexol (OMNIPAQUE) 350 MG/ML injection 80 mL (80 mLs Intravenous Contrast Given 04/09/14 1829)  meclizine (ANTIVERT) tablet 25 mg (25 mg Oral Given 04/09/14 2007)  sodium chloride 0.9 % bolus 1,000 mL (0 mLs Intravenous Stopped 04/09/14 2233)  LORazepam (ATIVAN) injection 1 mg (1 mg Intravenous Given 04/09/14 2146)    Filed Vitals:   04/09/14 2234 04/09/14 2300 04/09/14 2330 04/09/14 2345  BP: 137/58 127/52 117/57 129/61  Pulse: 85 83 81 79  Temp:      TempSrc:      Resp: 18 18 16 17   SpO2: 95% 96% 92% 95%   EKG noted sinus rhythm with a heart rate of 93 bpm. Troponin negative elevation. CBC noted elevated white blood cell count of 14.8 with elevated neutrophils of 9.0. CMP noted low sodium of 128, Loretta of 90. Glucose 167. BUN elevated at 26. Negative elevated anion gap-14.0 mg/L. Lipase negative elevation. CBG 147. Urinalysis negative for infection. CTA neck unremarkable. CTA head unremarkable. MRI of brain negative for acute intracranial process-no acute ischemia nor hydrocephalus. VP shunt in place. Patient presenting to the ED with sudden onset of dizziness. CT angiogram of head and neck unremarkable. MRI of brain unremarkable. Negative findings of stroke, acute hydrocephalus, hemorrhage. Patient given IV fluids and IV medications while in the ED setting without relief. Patient seen and assessed in the ED setting by attending physician. As per physician agrees to plan of admission for symptomatic treatment. Discussed case with [facility] - patient to be admitted to Telemetry floor for observation. Neurosurgery consulted regarding recalibration of VP  shunt - Dr. Ellene Route to be consulted for recalibration to be performed. Discussed plan for admission with patient who agrees to plan of care. Patient stable for transfer.  Jamse Mead, PA-C 04/10/14 0041  Jamse Mead, PA-C 04/10/14 6606  Ephraim Hamburger,  MD 04/18/14 [phone]

## 2014-04-09 NOTE — ED Notes (Signed)
PA at bedside.

## 2014-04-09 NOTE — ED Notes (Signed)
Pt reports movement increases dizziness. Has small frontal pressure headache. Pt ambulated with PA in room; pt appears off balance. Assisted to bed without difficulty. Pt also reports having two teeth extractions on Friday. On Saturday, pt had some dizziness when ambulating; reports falling at home and hit L leg on stairs. Abrasion noted. Denies LOC. Dark red/brown emesis noted in emesis bag. Denies black or tarry stools. Denies GU issues

## 2014-04-09 NOTE — ED Notes (Signed)
Pt c/o NV since 10 am. Denies diarrhea. Reports generalized weakness, lightheadedness and chills since onset of nausea. Hypoactive bowel sounds noted. Pt is wearing insulin pump on R side and blood glucose sensor on the L side of abdomen. Pt reports blood glucose levels have been controlled; expressed concerns of hyperglycemia. Pt shivering on assessment.

## 2014-04-09 NOTE — ED Notes (Signed)
Pt remains in MRI 

## 2014-04-09 NOTE — ED Notes (Signed)
Pt expressing concerns for removing insulin pump and sensor attached to pump. MRI contacted in regards to situation, states pump and sensor must be removed prior to MRI

## 2014-04-09 NOTE — ED Notes (Signed)
Urinal at bedside. Pt aware of need for urine sample 

## 2014-04-10 ENCOUNTER — Encounter (HOSPITAL_COMMUNITY): Payer: Self-pay | Admitting: Internal Medicine

## 2014-04-10 DIAGNOSIS — I10 Essential (primary) hypertension: Secondary | ICD-10-CM | POA: Diagnosis present

## 2014-04-10 DIAGNOSIS — R42 Dizziness and giddiness: Secondary | ICD-10-CM

## 2014-04-10 DIAGNOSIS — E871 Hypo-osmolality and hyponatremia: Secondary | ICD-10-CM | POA: Diagnosis present

## 2014-04-10 DIAGNOSIS — G912 (Idiopathic) normal pressure hydrocephalus: Secondary | ICD-10-CM

## 2014-04-10 DIAGNOSIS — R072 Precordial pain: Secondary | ICD-10-CM

## 2014-04-10 DIAGNOSIS — E119 Type 2 diabetes mellitus without complications: Secondary | ICD-10-CM

## 2014-04-10 LAB — COMPREHENSIVE METABOLIC PANEL
ALT: 23 U/L (ref 0–53)
ANION GAP: 7 (ref 5–15)
AST: 26 U/L (ref 0–37)
Albumin: 3.2 g/dL — ABNORMAL LOW (ref 3.5–5.2)
Alkaline Phosphatase: 43 U/L (ref 39–117)
BILIRUBIN TOTAL: 0.8 mg/dL (ref 0.3–1.2)
BUN: 18 mg/dL (ref 6–23)
CHLORIDE: 97 meq/L (ref 96–112)
CO2: 24 mmol/L (ref 19–32)
CREATININE: 0.85 mg/dL (ref 0.50–1.35)
Calcium: 8.3 mg/dL — ABNORMAL LOW (ref 8.4–10.5)
GFR calc Af Amer: >90 mL/min (ref >90)
GFR calc non Af Amer: >90 mL/min (ref >90)
Glucose, Bld: 187 mg/dL — ABNORMAL HIGH (ref 70–99)
Potassium: 3.9 mmol/L (ref 3.5–5.1)
Sodium: 128 mmol/L — ABNORMAL LOW (ref 135–145)
TOTAL PROTEIN: 5.3 g/dL — AB (ref 6.0–8.3)

## 2014-04-10 LAB — CBC
HEMATOCRIT: 33.4 % — AB (ref 39.0–52.0)
Hemoglobin: 11.5 g/dL — ABNORMAL LOW (ref 13.0–17.0)
MCH: 31.2 pg (ref 26.0–34.0)
MCHC: 34.4 g/dL (ref 30.0–36.0)
MCV: 90.5 fL (ref 78.0–100.0)
PLATELETS: 239 10*3/uL (ref 150–400)
RBC: 3.69 MIL/uL — ABNORMAL LOW (ref 4.22–5.81)
RDW: 12.3 % (ref 11.5–15.5)
WBC: 10.7 10*3/uL — ABNORMAL HIGH (ref 4.0–10.5)

## 2014-04-10 LAB — TROPONIN I
TROPONIN I: 0.04 ng/mL — AB (ref <0.031)
TROPONIN I: 0.05 ng/mL — AB (ref <0.031)
Troponin I: 0.04 ng/mL — ABNORMAL HIGH (ref <0.031)
Troponin I: 0.05 ng/mL — ABNORMAL HIGH (ref <0.031)

## 2014-04-10 LAB — HEMOGLOBIN A1C
Hgb A1c MFr Bld: 6.7 % — ABNORMAL HIGH (ref <5.7)
MEAN PLASMA GLUCOSE: 146 mg/dL — AB (ref <117)

## 2014-04-10 LAB — GLUCOSE, CAPILLARY
GLUCOSE-CAPILLARY: 164 mg/dL — AB (ref 70–99)
GLUCOSE-CAPILLARY: 201 mg/dL — AB (ref 70–99)
Glucose-Capillary: 182 mg/dL — ABNORMAL HIGH (ref 70–99)
Glucose-Capillary: 210 mg/dL — ABNORMAL HIGH (ref 70–99)
Glucose-Capillary: 183 mg/dL — ABNORMAL HIGH (ref 70–99)
Glucose-Capillary: 148 mg/dL — ABNORMAL HIGH (ref 70–99)
Glucose-Capillary: 133 mg/dL — ABNORMAL HIGH (ref 70–99)

## 2014-04-10 LAB — SODIUM, URINE, RANDOM: Sodium, Ur: 76 mmol/L

## 2014-04-10 LAB — OSMOLALITY, URINE: Osmolality, Ur: 867 mOsm/kg (ref 390–1090)

## 2014-04-10 LAB — TSH: TSH: 1.193 u[IU]/mL (ref 0.350–4.500)

## 2014-04-10 LAB — OSMOLALITY: OSMOLALITY: 277 mosm/kg (ref 275–300)

## 2014-04-10 MED ORDER — AMLODIPINE BESYLATE 10 MG PO TABS
10.0000 mg | ORAL_TABLET | Freq: Every day | ORAL | Status: DC
  Administered 2014-04-10: 10 mg via ORAL
  Filled 2014-04-10: qty 1

## 2014-04-10 MED ORDER — LISINOPRIL 40 MG PO TABS: 40.0000 mg | ORAL_TABLET | Freq: Every day | ORAL | Status: DC

## 2014-04-10 MED ORDER — AMOXICILLIN 500 MG PO CAPS
500.0000 mg | ORAL_CAPSULE | Freq: Two times a day (BID) | ORAL | Status: AC
  Administered 2014-04-10 (×2): 500 mg via ORAL
  Filled 2014-04-10 (×2): qty 1

## 2014-04-10 MED ORDER — PNEUMOCOCCAL VAC POLYVALENT 25 MCG/0.5ML IJ INJ: 0.5000 mL | INJECTION | INTRAMUSCULAR | Status: DC

## 2014-04-10 MED ORDER — ATORVASTATIN CALCIUM 80 MG PO TABS
80.0000 mg | ORAL_TABLET | Freq: Every day | ORAL | Status: DC
  Administered 2014-04-10: 80 mg via ORAL
  Filled 2014-04-10: qty 1

## 2014-04-10 MED ORDER — SODIUM CHLORIDE 0.9 % IJ SOLN: 3.0000 mL | Freq: Two times a day (BID) | INTRAMUSCULAR | Status: DC

## 2014-04-10 MED ORDER — ATENOLOL 25 MG PO TABS
12.5000 mg | ORAL_TABLET | Freq: Every day | ORAL | Status: DC
  Administered 2014-04-10: 12.5 mg via ORAL
  Filled 2014-04-10: qty 1

## 2014-04-10 MED ORDER — ASPIRIN EC 325 MG PO TBEC
325.0000 mg | DELAYED_RELEASE_TABLET | Freq: Every day | ORAL | Status: DC
  Administered 2014-04-10: 325 mg via ORAL
  Filled 2014-04-10: qty 1

## 2014-04-10 MED ORDER — ACETAMINOPHEN 650 MG RE SUPP: 650.0000 mg | Freq: Four times a day (QID) | RECTAL | Status: DC | PRN

## 2014-04-10 MED ORDER — PRAVASTATIN SODIUM 40 MG PO TABS: 40.0000 mg | ORAL_TABLET | Freq: Every day | ORAL | Status: DC

## 2014-04-10 MED ORDER — SODIUM CHLORIDE 0.9 % IV SOLN
INTRAVENOUS | Status: DC
Start: 2014-04-10 — End: 2014-04-10
  Administered 2014-04-10: 03:00:00 via INTRAVENOUS

## 2014-04-10 MED ORDER — ACETAMINOPHEN 325 MG PO TABS
650.0000 mg | ORAL_TABLET | Freq: Four times a day (QID) | ORAL | Status: DC | PRN
  Administered 2014-04-10: 650 mg via ORAL
  Filled 2014-04-10: qty 2

## 2014-04-10 MED ORDER — HYDROCODONE-ACETAMINOPHEN 5-325 MG PO TABS: 1.0000 | ORAL_TABLET | Freq: Four times a day (QID) | ORAL | Status: DC | PRN

## 2014-04-10 MED ORDER — INSULIN ASPART 100 UNIT/ML ~~LOC~~ SOLN: 0.0000 [IU] | Freq: Every day | SUBCUTANEOUS | Status: DC

## 2014-04-10 MED ORDER — ENOXAPARIN SODIUM 40 MG/0.4ML ~~LOC~~ SOLN
40.0000 mg | SUBCUTANEOUS | Status: DC
  Filled 2014-04-10: qty 0.4

## 2014-04-10 MED ORDER — PANTOPRAZOLE SODIUM 40 MG PO TBEC
40.0000 mg | DELAYED_RELEASE_TABLET | Freq: Every day | ORAL | Status: DC
  Administered 2014-04-10 (×2): 40 mg via ORAL
  Filled 2014-04-10 (×2): qty 1

## 2014-04-10 MED ORDER — LISINOPRIL 20 MG PO TABS
40.0000 mg | ORAL_TABLET | Freq: Every day | ORAL | Status: DC
  Administered 2014-04-10: 40 mg via ORAL
  Filled 2014-04-10: qty 2

## 2014-04-10 MED ORDER — INSULIN ASPART 100 UNIT/ML ~~LOC~~ SOLN: 0.0000 [IU] | Freq: Three times a day (TID) | SUBCUTANEOUS | Status: DC

## 2014-04-10 NOTE — ED Notes (Signed)
Admitting MD at bedside.

## 2014-04-10 NOTE — Progress Notes (Signed)
UR completed 

## 2014-04-10 NOTE — Progress Notes (Signed)
D/C orders received, pt for D/C home today.  IV and telemetry D/C.  Rx and D/C instructions given with verbalized understanding.  Family at bedside to assist with D/C.  Staff brought pt downstairs via wheelchair.  

## 2014-04-10 NOTE — Discharge Summary (Addendum)
Discharge Summary  Christopher Conner VPX:[id number] DOB: 1950/12/26  PCP: Thressa Sheller, MD  Admit date: 04/09/2014 Discharge date: 04/10/2014  Time spent: 25 minutes  Recommendations for Outpatient Follow-up:  1. Medication change: Lisinopril/HCTZ being discontinued and replaced with lisinopril 40 mg by mouth daily 2. Patient will follow-up with his primary physician, Dr. Trilby Drummer in a few weeks as scheduled. At that time, a repeat sodium level will be checked to see if there is improvement and if this is correlated with improvement in symptoms.  Also recommend follow-up troponin level to be checked. See below.  Discharge Diagnoses:  Active Hospital Problems   Diagnosis Date Noted  . 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 Hyponatremia 04/10/2014  . Diabetes 04/10/2014  . Hypertension 04/10/2014  . Dizziness 04/09/2014    Resolved Hospital Problems   Diagnosis Date Noted Date Resolved  No resolved problems to display.    Discharge Condition: Improved, being discharged home  Diet recommendation: Low-sodium  Filed Weights   04/10/14 0128 04/10/14 0500  Weight: 82.6 kg (182 lb 1.6 oz) 82.6 kg (182 lb 1.6 oz)    History of present illness:  Patient is a 63 year old male with past history of diabetes mellitus and hyperlipidemia and hyponatremia in the past he started having episode of dizziness and gait instability on the morning of 12/21. He came into the emergency room for evaluation and CT and MRI were negative for acute process. Patient was admitted for further workup. He also has a history of a  VP shunt which after MRI there was concern whether it needs to be recalibrated. Neurosurgery was called with plans to see him the following day.   Hospital Course:  Active Problems:   Dizziness: Patient's dizziness seem to be somewhat better. He was not having any abnormality of gait and symptoms seemed to be somewhat improved. His sodium was noted to be slightly low at  128 and despite IV fluids, will change the following day. Suspect that this may be related to patient's hydrochlorothiazide, a medication that has been known to do this. HCTZ has been discontinued and plan we did continue patient on lisinopril. He'll follow up with his primary care physician which has [place] scheduled in the next few weeks that time repeat sodium level can be checked and see if an improvement in sodium as well as an improvement in symptoms are correlated. If not, patient may benefit from a outpatient neurology evaluation for these spells. In the interim, echocardiogram done was normal   Normal pressure hydrocephalus: Patient seen by neurosurgery who performed recalibration without issue   Hyponatremia: As above   Diabetes: Blood sugars have remained stable, patient continued on insulin pump   Hypertension: Stable. Medication changes above.  Elevated troponins: Troponin levels checked and patient had borderline elevated levels of 0.04-0.05 with no further elevations. Patient has been chest pain-free without any complaints since admission. EKG unrevealing. Suspect this is more elevated lab level. PCP to follow up   Procedures:  Echocardiogram done 12/22: Normal    recalibration of VP shunt  Consultations:  Neurosurgery-Elsner   Discharge Exam: BP 129/58 mmHg  Pulse 76  Temp(Src) 98.1 F (36.7 C) (Oral)  Resp 18  Ht 5\' 10"  (1.778 m)  Wt 82.6 kg (182 lb 1.6 oz)  BMI 26.13 kg/m2  SpO2 97%  General: Alert and oriented 3, no acute distress Cardiovascular: Regular rate and rhythm, S1-S2  Respiratory: Clear to auscultation bilaterally   Discharge Instructions You were cared for by a hospitalist during  your hospital stay. If you have any questions about your discharge medications or the care you received while you were in the hospital after you are discharged, you can call the unit and asked to speak with the hospitalist on call if the hospitalist that took care of you is  not available. Once you are discharged, your primary care physician will handle any further medical issues. Please note that NO REFILLS for any discharge medications will be authorized once you are discharged, as it is imperative that you return to your primary care physician (or establish a relationship with a primary care physician if you do not have one) for your aftercare needs so that they can reassess your need for medications and monitor your lab values.  Discharge Instructions    Diet - low sodium heart healthy    Complete by:  As directed      Increase activity slowly    Complete by:  As directed             Medication List    STOP taking these medications        lisinopril-hydrochlorothiazide 20-12.5 MG per tablet  Commonly known as:  PRINZIDE,ZESTORETIC      TAKE these medications        amLODipine 10 MG tablet  Commonly known as:  NORVASC  Take 10 mg by mouth daily.     amoxicillin 500 MG capsule  Commonly known as:  AMOXIL  Take 500 mg by mouth 2 (two) times daily. For 3 days. On 2 of therapy     HYDROcodone-acetaminophen 5-325 MG per tablet  Commonly known as:  NORCO/VICODIN  Take 1 tablet by mouth every 6 (six) hours as needed. pain     insulin lispro 100 UNIT/ML injection  Commonly known as:  HUMALOG  Inject into the skin 3 (three) times daily before meals. Cont pump     lisinopril 40 MG tablet  Commonly known as:  PRINIVIL,ZESTRIL  Take 1 tablet (40 mg total) by mouth daily.     pravastatin 40 MG tablet  Commonly known as:  PRAVACHOL  Take 40 mg by mouth daily.       No Known Allergies    The results of significant diagnostics from this hospitalization (including imaging, microbiology, ancillary and laboratory) are listed below for reference.    Significant Diagnostic Studies: Ct Angio Head W/cm &/or Wo Cm  04/09/2014   CLINICAL DATA:  63 year old hypertensive diabetic male with history of hydrocephalus post shunt presenting with dizziness and  generalized weakness with nausea and vomiting since this morning. Initial encounter.  EXAM: CT ANGIOGRAPHY HEAD AND NECK  TECHNIQUE: Multidetector CT imaging of the head and neck was performed using the standard protocol during bolus administration of intravenous contrast. Multiplanar CT image reconstructions and MIPs were obtained to evaluate the vascular anatomy. Carotid stenosis measurements (when applicable) are obtained utilizing NASCET criteria, using the distal internal carotid diameter as the denominator.  CONTRAST:  34mL OMNIPAQUE IOHEXOL 350 MG/ML SOLN  COMPARISON:  03/11/2011 MR brain and 05/23/2010 CT.  FINDINGS: CT HEAD  Brain: No intracranial hemorrhage.  Ventricular shunt catheter enters from the right parietal-occipital region extending through the right lateral ventricle with the tip at the level of the anterior septum pellucidum to the right of midline. The right lateral ventricle is slit-like but unchanged from the prior examination. Size of the left ventricles unchanged.  No intracranial enhancing lesion.  No CT evidence of large acute infarct. If infarct is of high  clinical concern (particularly posterior fossa infarct) MR may be considered.  Paranasal sinuses: Clear.  Orbits: Left ptysis bulbi.  CTA NECK  Aortic arch: Aberrant right subclavian artery with calcification and mild to moderate narrowing proximal aspect of the right subclavian artery. Origin of the great vessels not entirely included on present exam.  Right carotid system: Moderate calcified plaque right carotid bifurcation with less than 50% diameter stenosis proximal right internal carotid artery.  Left carotid system: Mild calcified plaque left carotid bifurcation without significant stenosis.  Vertebral arteries: No significant stenosis dominant left vertebral artery.  The right vertebral artery origin appears to arise from lateral aspect of the right common carotid artery. Marked narrowing proximal right vertebral artery.  No  neck mass or adenopathy noted. No apical lung lesion detected. Cervical spondylotic changes most prominent C4-5.  CTA HEAD  Anterior circulation: Calcified internal carotid artery cavernous segment bilaterally with mild narrowing. No significant stenosis of medium or large size vessels. Branch vessel irregularity.  Posterior circulation: Moderate narrowing right vertebral artery. Left vertebral artery dominant in size. Narrowed irregular basilar artery. The small caliber of the basilar artery is partially explained by the fetal type contribution to the posterior cerebral arteries. Branch vessel irregularity.  Venous sinuses: Patent  IMPRESSION:  CT HEAD  No intracranial hemorrhage.  Ventricular shunt catheter enters extending through the right lateral ventricle. The right lateral ventricle is slit-like but unchanged from the prior examination. Size of the left ventricle is unchanged.  No intracranial enhancing lesion.  No CT evidence of large acute infarct. If infarct is of high clinical concern (particularly posterior fossa infarct) MR may be considered.  CTA NECK  Aberrant right subclavian artery with calcification and mild to moderate narrowing proximal aspect of the right subclavian artery.  Right carotid system: Moderate calcified plaque right carotid bifurcation with less than 50% diameter stenosis proximal right internal carotid artery.  Left carotid system: Mild calcified plaque left carotid bifurcation without significant stenosis.  No significant stenosis of the dominant left vertebral artery.  The right vertebral artery origin appears to arise from lateral aspect of the right common carotid artery. Marked narrowing proximal right vertebral artery.  CTA HEAD  Anterior circulation: Calcified internal carotid artery cavernous segment bilaterally with mild narrowing. No significant stenosis of medium or large size vessels. Branch vessel irregularity.  Posterior circulation: Moderate narrowing right vertebral  artery. Left vertebral artery dominant in size. Narrowed irregular basilar artery. The small caliber of the basilar artery is partially explained by the fetal type contribution to the posterior cerebral arteries. Branch vessel irregularity.   Electronically Signed   By: Chauncey Cruel M.D.   On: 04/09/2014 20:01   Ct Angio Neck W/cm &/or Wo/cm  04/09/2014   CLINICAL DATA:  63 year old hypertensive diabetic male with history of hydrocephalus post shunt presenting with dizziness and generalized weakness with nausea and vomiting since this morning. Initial encounter.  EXAM: CT ANGIOGRAPHY HEAD AND NECK  TECHNIQUE: Multidetector CT imaging of the head and neck was performed using the standard protocol during bolus administration of intravenous contrast. Multiplanar CT image reconstructions and MIPs were obtained to evaluate the vascular anatomy. Carotid stenosis measurements (when applicable) are obtained utilizing NASCET criteria, using the distal internal carotid diameter as the denominator.  CONTRAST:  68mL OMNIPAQUE IOHEXOL 350 MG/ML SOLN  COMPARISON:  03/11/2011 MR brain and 05/23/2010 CT.  FINDINGS: CT HEAD  Brain: No intracranial hemorrhage.  Ventricular shunt catheter enters from the right parietal-occipital region extending through the right lateral  ventricle with the tip at the level of the anterior septum pellucidum to the right of midline. The right lateral ventricle is slit-like but unchanged from the prior examination. Size of the left ventricles unchanged.  No intracranial enhancing lesion.  No CT evidence of large acute infarct. If infarct is of high clinical concern (particularly posterior fossa infarct) MR may be considered.  Paranasal sinuses: Clear.  Orbits: Left ptysis bulbi.  CTA NECK  Aortic arch: Aberrant right subclavian artery with calcification and mild to moderate narrowing proximal aspect of the right subclavian artery. Origin of the great vessels not entirely included on present exam.   Right carotid system: Moderate calcified plaque right carotid bifurcation with less than 50% diameter stenosis proximal right internal carotid artery.  Left carotid system: Mild calcified plaque left carotid bifurcation without significant stenosis.  Vertebral arteries: No significant stenosis dominant left vertebral artery.  The right vertebral artery origin appears to arise from lateral aspect of the right common carotid artery. Marked narrowing proximal right vertebral artery.  No neck mass or adenopathy noted. No apical lung lesion detected. Cervical spondylotic changes most prominent C4-5.  CTA HEAD  Anterior circulation: Calcified internal carotid artery cavernous segment bilaterally with mild narrowing. No significant stenosis of medium or large size vessels. Branch vessel irregularity.  Posterior circulation: Moderate narrowing right vertebral artery. Left vertebral artery dominant in size. Narrowed irregular basilar artery. The small caliber of the basilar artery is partially explained by the fetal type contribution to the posterior cerebral arteries. Branch vessel irregularity.  Venous sinuses: Patent  IMPRESSION:  CT HEAD  No intracranial hemorrhage.  Ventricular shunt catheter enters extending through the right lateral ventricle. The right lateral ventricle is slit-like but unchanged from the prior examination. Size of the left ventricle is unchanged.  No intracranial enhancing lesion.  No CT evidence of large acute infarct. If infarct is of high clinical concern (particularly posterior fossa infarct) MR may be considered.  CTA NECK  Aberrant right subclavian artery with calcification and mild to moderate narrowing proximal aspect of the right subclavian artery.  Right carotid system: Moderate calcified plaque right carotid bifurcation with less than 50% diameter stenosis proximal right internal carotid artery.  Left carotid system: Mild calcified plaque left carotid bifurcation without significant  stenosis.  No significant stenosis of the dominant left vertebral artery.  The right vertebral artery origin appears to arise from lateral aspect of the right common carotid artery. Marked narrowing proximal right vertebral artery.  CTA HEAD  Anterior circulation: Calcified internal carotid artery cavernous segment bilaterally with mild narrowing. No significant stenosis of medium or large size vessels. Branch vessel irregularity.  Posterior circulation: Moderate narrowing right vertebral artery. Left vertebral artery dominant in size. Narrowed irregular basilar artery. The small caliber of the basilar artery is partially explained by the fetal type contribution to the posterior cerebral arteries. Branch vessel irregularity.   Electronically Signed   By: Chauncey Cruel M.D.   On: 04/09/2014 20:01   Mr Brain Wo Contrast (neuro Protocol)  04/09/2014   CLINICAL DATA:  Acute onset dizziness and vomiting, history of hydrocephalus and shunt.  EXAM: MRI HEAD WITHOUT CONTRAST  TECHNIQUE: Multiplanar, multiecho pulse sequences of the brain and surrounding structures were obtained without intravenous contrast.  COMPARISON:  CT of the head April 09, 2014 at 1831 hr and MRI of the brain March 11, 2011  FINDINGS: No reduced diffusion to suggest acute ischemia. No susceptibility artifact to suggest hemorrhage. No midline shift or mass effect.  Ventriculoperitoneal shunt via RIGHT occipital burr hole, distal tip at the level the RIGHT lateral ventricle/ foramen of Monro, slightly RIGHT lateral ventricle is unchanged. No hydrocephalus. A few scattered subcentimeter supratentorial white matter T2 hyperintensities are similar.  No abnormal extra-axial fluid collections. Normal major intracranial vascular flow voids at the skull.  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 Status post RIGHT ocular on lens implant. Mild paranasal sinus mucosal thickening without air-fluid levels. Mastoid air cells are well aerated. No abnormal  sellar expansion. No cerebellar tonsillar ectopia.  IMPRESSION: No acute intracranial process, specifically no acute ischemia nor hydrocephalus, similar appearance of ventricles. Ventriculoperitoneal shunt in situ.  Mild white matter changes can be seen with chronic small vessel ischemic disease.   Electronically Signed   By: Elon Alas   On: 04/09/2014 22:57    Microbiology: No results found for this or any previous visit (from the past 240 hour(s)).   Labs: Basic Metabolic Panel:  Recent Labs Lab 04/09/14 1624 04/10/14 0656  NA 128* 128*  K 4.3 3.9  CL 90* 97  CO2 24 24  GLUCOSE 167* 187*  BUN 26* 18  CREATININE 0.83 0.85  CALCIUM 9.4 8.3*   Liver Function Tests:  Recent Labs Lab 04/09/14 1624 04/10/14 0656  AST 34 26  ALT 27 23  ALKPHOS 58 43  BILITOT 0.5 0.8  PROT 7.1 5.3*  ALBUMIN 4.1 3.2*    Recent Labs Lab 04/09/14 1624  LIPASE 20   No results for input(s): AMMONIA in the last 168 hours. CBC:  Recent Labs Lab 04/09/14 1624 04/10/14 0656  WBC 14.8* 10.7*  NEUTROABS 9.0*  --   HGB 13.2 11.5*  HCT 37.9* 33.4*  MCV 91.3 90.5  PLT 268 239   Cardiac Enzymes:  Recent Labs Lab 04/09/14 1624 04/10/14 0031 04/10/14 0656 04/10/14 1152  TROPONINI <0.30 0.04* 0.04* 0.05*   BNP: BNP (last 3 results) No results for input(s): PROBNP in the last 8760 hours. CBG:  Recent Labs Lab 04/10/14 0250 04/10/14 0640 04/10/14 0845 04/10/14 1208 04/10/14 1628  GLUCAP 183* 164* 148* 201* 133*       Signed:  Caroll Weinheimer K  [facility] 04/10/2014, 5:21 PM

## 2014-04-10 NOTE — ED Notes (Signed)
Transporting patient to new room assignment. 

## 2014-04-10 NOTE — Progress Notes (Signed)
Called to get report from ED and nurse will call back Ruben Gottron, RN  04/10/2014 12:35 AM

## 2014-04-10 NOTE — Progress Notes (Signed)
Talked to MD about whether to let pt. Operate insulin pump or go by sliding scale ordered. She stated that she would put in diet orders and will let him operate his pump. Also stated that she would pass along that both are ordered and need clarification as to what the plan is. Ruben Gottron, RN  04/10/2014 3:57 AM

## 2014-04-10 NOTE — H&P (Addendum)
Christopher Conner is an 63 y.o. male.    Pcp: Thressa Sheller Neurosurgeon:  Kristeen Miss  Chief Complaint: dizziness HPI: 63 yo male with dm2, htn, hyperlipidemia, hyponatremia apparently c/o dizziness (not vertigo), and gait instability starting about 10 am 04/09/2014.  Eyes ache. And slight nausea/vomitting.   Pt denies headache, cp, palp, sob, diarrhea, abd pain, brbpr, black stool.  Pt denies focal weakness numbness, tingling.  Pt presented to ED for evaluation of dizziness and CT brain and MRI brain were negative for any acute process.  Pt will be admitted for observation of dizziness.     Past Medical History  Diagnosis Date  . Diabetes mellitus without complication   . Hypertension   . High cholesterol   . Normal pressure hydrocephalus   . Hyponatremia   . GERD (gastroesophageal reflux disease)     Past Surgical History  Procedure Laterality Date  . Ventriculoperitoneal shunt    . Back surgery    . Lumbar fusion    . Appendectomy    . Cyst removal neck    . Cataract extraction      left eye    Family History  Problem Relation Age of Onset  . Hypertension Mother   . Hyperlipidemia Mother   . Diabetes Mother   . Diabetes Father    Social History:  reports that he has never smoked. He does not have any smokeless tobacco history on file. He reports that he does not drink alcohol or use illicit drugs.  Allergies: No Known Allergies   (Not in a hospital admission)  Results for orders placed or performed during the hospital encounter of 04/09/14 (from the past 48 hour(s))  CBG monitoring, ED     Status: Abnormal   Collection Time: 04/09/14  4:00 PM  Result Value Ref Range   Glucose-Capillary 147 (H) 70 - 99 mg/dL   Comment 1 Notify RN   CBC with Differential     Status: Abnormal   Collection Time: 04/09/14  4:24 PM  Result Value Ref Range   WBC 14.8 (H) 4.0 - 10.5 K/uL   RBC 4.15 (L) 4.22 - 5.81 MIL/uL   Hemoglobin 13.2 13.0 - 17.0 g/dL   HCT 37.9 (L) 39.0 -  52.0 %   MCV 91.3 78.0 - 100.0 fL   MCH 31.8 26.0 - 34.0 pg   MCHC 34.8 30.0 - 36.0 g/dL   RDW 12.3 11.5 - 15.5 %   Platelets 268 150 - 400 K/uL   Neutrophils Relative % 60 43 - 77 %   Neutro Abs 9.0 (H) 1.7 - 7.7 K/uL   Lymphocytes Relative 34 12 - 46 %   Lymphs Abs 5.1 (H) 0.7 - 4.0 K/uL   Monocytes Relative 5 3 - 12 %   Monocytes Absolute 0.7 0.1 - 1.0 K/uL   Eosinophils Relative 1 0 - 5 %   Eosinophils Absolute 0.1 0.0 - 0.7 K/uL   Basophils Relative 0 0 - 1 %   Basophils Absolute 0.0 0.0 - 0.1 K/uL  Comprehensive metabolic panel     Status: Abnormal   Collection Time: 04/09/14  4:24 PM  Result Value Ref Range   Sodium 128 (L) 137 - 147 mEq/L   Potassium 4.3 3.7 - 5.3 mEq/L   Chloride 90 (L) 96 - 112 mEq/L   CO2 24 19 - 32 mEq/L   Glucose, Bld 167 (H) 70 - 99 mg/dL   BUN 26 (H) 6 - 23 mg/dL   Creatinine, Ser  0.83 0.50 - 1.35 mg/dL   Calcium 9.4 8.4 - 10.5 mg/dL   Total Protein 7.1 6.0 - 8.3 g/dL   Albumin 4.1 3.5 - 5.2 g/dL   AST 34 0 - 37 U/L   ALT 27 0 - 53 U/L   Alkaline Phosphatase 58 39 - 117 U/L   Total Bilirubin 0.5 0.3 - 1.2 mg/dL   GFR calc non Af Amer >90 >90 mL/min   GFR calc Af Amer >90 >90 mL/min    Comment: (NOTE) The eGFR has been calculated using the CKD EPI equation. This calculation has not been validated in all clinical situations. eGFR's persistently <90 mL/min signify possible Chronic Kidney Disease.    Anion gap 14 5 - 15  Lipase, blood     Status: None   Collection Time: 04/09/14  4:24 PM  Result Value Ref Range   Lipase 20 11 - 59 U/L  Troponin I     Status: None   Collection Time: 04/09/14  4:24 PM  Result Value Ref Range   Troponin I <0.30 <0.30 ng/mL    Comment:        Due to the release kinetics of cTnI, a negative result within the first hours of the onset of symptoms does not rule out myocardial infarction with certainty. If myocardial infarction is still suspected, repeat the test at appropriate intervals.   Urinalysis,  Routine w reflex microscopic     Status: Abnormal   Collection Time: 04/09/14  7:47 PM  Result Value Ref Range   Color, Urine YELLOW YELLOW   APPearance CLEAR CLEAR   Specific Gravity, Urine 1.040 (H) 1.005 - 1.030   pH 5.0 5.0 - 8.0   Glucose, UA NEGATIVE NEGATIVE mg/dL   Hgb urine dipstick NEGATIVE NEGATIVE   Bilirubin Urine NEGATIVE NEGATIVE   Ketones, ur 40 (A) NEGATIVE mg/dL   Protein, ur NEGATIVE NEGATIVE mg/dL   Urobilinogen, UA 0.2 0.0 - 1.0 mg/dL   Nitrite NEGATIVE NEGATIVE   Leukocytes, UA NEGATIVE NEGATIVE    Comment: MICROSCOPIC NOT DONE ON URINES WITH NEGATIVE PROTEIN, BLOOD, LEUKOCYTES, NITRITE, OR GLUCOSE <1000 mg/dL.   Ct Angio Head W/cm &/or Wo Cm  04/09/2014   CLINICAL DATA:  63 year old hypertensive diabetic male with history of hydrocephalus post shunt presenting with dizziness and generalized weakness with nausea and vomiting since this morning. Initial encounter.  EXAM: CT ANGIOGRAPHY HEAD AND NECK  TECHNIQUE: Multidetector CT imaging of the head and neck was performed using the standard protocol during bolus administration of intravenous contrast. Multiplanar CT image reconstructions and MIPs were obtained to evaluate the vascular anatomy. Carotid stenosis measurements (when applicable) are obtained utilizing NASCET criteria, using the distal internal carotid diameter as the denominator.  CONTRAST:  68m OMNIPAQUE IOHEXOL 350 MG/ML SOLN  COMPARISON:  03/11/2011 MR brain and 05/23/2010 CT.  FINDINGS: CT HEAD  Brain: No intracranial hemorrhage.  Ventricular shunt catheter enters from the right parietal-occipital region extending through the right lateral ventricle with the tip at the level of the anterior septum pellucidum to the right of midline. The right lateral ventricle is slit-like but unchanged from the prior examination. Size of the left ventricles unchanged.  No intracranial enhancing lesion.  No CT evidence of large acute infarct. If infarct is of high clinical  concern (particularly posterior fossa infarct) MR may be considered.  Paranasal sinuses: Clear.  Orbits: Left ptysis bulbi.  CTA NECK  Aortic arch: Aberrant right subclavian artery with calcification and mild to moderate narrowing proximal aspect  of the right subclavian artery. Origin of the great vessels not entirely included on present exam.  Right carotid system: Moderate calcified plaque right carotid bifurcation with less than 50% diameter stenosis proximal right internal carotid artery.  Left carotid system: Mild calcified plaque left carotid bifurcation without significant stenosis.  Vertebral arteries: No significant stenosis dominant left vertebral artery.  The right vertebral artery origin appears to arise from lateral aspect of the right common carotid artery. Marked narrowing proximal right vertebral artery.  No neck mass or adenopathy noted. No apical lung lesion detected. Cervical spondylotic changes most prominent C4-5.  CTA HEAD  Anterior circulation: Calcified internal carotid artery cavernous segment bilaterally with mild narrowing. No significant stenosis of medium or large size vessels. Branch vessel irregularity.  Posterior circulation: Moderate narrowing right vertebral artery. Left vertebral artery dominant in size. Narrowed irregular basilar artery. The small caliber of the basilar artery is partially explained by the fetal type contribution to the posterior cerebral arteries. Branch vessel irregularity.  Venous sinuses: Patent  IMPRESSION:  CT HEAD  No intracranial hemorrhage.  Ventricular shunt catheter enters extending through the right lateral ventricle. The right lateral ventricle is slit-like but unchanged from the prior examination. Size of the left ventricle is unchanged.  No intracranial enhancing lesion.  No CT evidence of large acute infarct. If infarct is of high clinical concern (particularly posterior fossa infarct) MR may be considered.  CTA NECK  Aberrant right subclavian  artery with calcification and mild to moderate narrowing proximal aspect of the right subclavian artery.  Right carotid system: Moderate calcified plaque right carotid bifurcation with less than 50% diameter stenosis proximal right internal carotid artery.  Left carotid system: Mild calcified plaque left carotid bifurcation without significant stenosis.  No significant stenosis of the dominant left vertebral artery.  The right vertebral artery origin appears to arise from lateral aspect of the right common carotid artery. Marked narrowing proximal right vertebral artery.  CTA HEAD  Anterior circulation: Calcified internal carotid artery cavernous segment bilaterally with mild narrowing. No significant stenosis of medium or large size vessels. Branch vessel irregularity.  Posterior circulation: Moderate narrowing right vertebral artery. Left vertebral artery dominant in size. Narrowed irregular basilar artery. The small caliber of the basilar artery is partially explained by the fetal type contribution to the posterior cerebral arteries. Branch vessel irregularity.   Electronically Signed   By: Chauncey Cruel M.D.   On: 04/09/2014 20:01   Ct Angio Neck W/cm &/or Wo/cm  04/09/2014   CLINICAL DATA:  63 year old hypertensive diabetic male with history of hydrocephalus post shunt presenting with dizziness and generalized weakness with nausea and vomiting since this morning. Initial encounter.  EXAM: CT ANGIOGRAPHY HEAD AND NECK  TECHNIQUE: Multidetector CT imaging of the head and neck was performed using the standard protocol during bolus administration of intravenous contrast. Multiplanar CT image reconstructions and MIPs were obtained to evaluate the vascular anatomy. Carotid stenosis measurements (when applicable) are obtained utilizing NASCET criteria, using the distal internal carotid diameter as the denominator.  CONTRAST:  67m OMNIPAQUE IOHEXOL 350 MG/ML SOLN  COMPARISON:  03/11/2011 MR brain and 05/23/2010 CT.   FINDINGS: CT HEAD  Brain: No intracranial hemorrhage.  Ventricular shunt catheter enters from the right parietal-occipital region extending through the right lateral ventricle with the tip at the level of the anterior septum pellucidum to the right of midline. The right lateral ventricle is slit-like but unchanged from the prior examination. Size of the left ventricles unchanged.  No intracranial  enhancing lesion.  No CT evidence of large acute infarct. If infarct is of high clinical concern (particularly posterior fossa infarct) MR may be considered.  Paranasal sinuses: Clear.  Orbits: Left ptysis bulbi.  CTA NECK  Aortic arch: Aberrant right subclavian artery with calcification and mild to moderate narrowing proximal aspect of the right subclavian artery. Origin of the great vessels not entirely included on present exam.  Right carotid system: Moderate calcified plaque right carotid bifurcation with less than 50% diameter stenosis proximal right internal carotid artery.  Left carotid system: Mild calcified plaque left carotid bifurcation without significant stenosis.  Vertebral arteries: No significant stenosis dominant left vertebral artery.  The right vertebral artery origin appears to arise from lateral aspect of the right common carotid artery. Marked narrowing proximal right vertebral artery.  No neck mass or adenopathy noted. No apical lung lesion detected. Cervical spondylotic changes most prominent C4-5.  CTA HEAD  Anterior circulation: Calcified internal carotid artery cavernous segment bilaterally with mild narrowing. No significant stenosis of medium or large size vessels. Branch vessel irregularity.  Posterior circulation: Moderate narrowing right vertebral artery. Left vertebral artery dominant in size. Narrowed irregular basilar artery. The small caliber of the basilar artery is partially explained by the fetal type contribution to the posterior cerebral arteries. Branch vessel irregularity.  Venous  sinuses: Patent  IMPRESSION:  CT HEAD  No intracranial hemorrhage.  Ventricular shunt catheter enters extending through the right lateral ventricle. The right lateral ventricle is slit-like but unchanged from the prior examination. Size of the left ventricle is unchanged.  No intracranial enhancing lesion.  No CT evidence of large acute infarct. If infarct is of high clinical concern (particularly posterior fossa infarct) MR may be considered.  CTA NECK  Aberrant right subclavian artery with calcification and mild to moderate narrowing proximal aspect of the right subclavian artery.  Right carotid system: Moderate calcified plaque right carotid bifurcation with less than 50% diameter stenosis proximal right internal carotid artery.  Left carotid system: Mild calcified plaque left carotid bifurcation without significant stenosis.  No significant stenosis of the dominant left vertebral artery.  The right vertebral artery origin appears to arise from lateral aspect of the right common carotid artery. Marked narrowing proximal right vertebral artery.  CTA HEAD  Anterior circulation: Calcified internal carotid artery cavernous segment bilaterally with mild narrowing. No significant stenosis of medium or large size vessels. Branch vessel irregularity.  Posterior circulation: Moderate narrowing right vertebral artery. Left vertebral artery dominant in size. Narrowed irregular basilar artery. The small caliber of the basilar artery is partially explained by the fetal type contribution to the posterior cerebral arteries. Branch vessel irregularity.   Electronically Signed   By: Chauncey Cruel M.D.   On: 04/09/2014 20:01   Mr Brain Wo Contrast (neuro Protocol)  04/09/2014   CLINICAL DATA:  Acute onset dizziness and vomiting, history of hydrocephalus and shunt.  EXAM: MRI HEAD WITHOUT CONTRAST  TECHNIQUE: Multiplanar, multiecho pulse sequences of the brain and surrounding structures were obtained without intravenous  contrast.  COMPARISON:  CT of the head April 09, 2014 at 1831 hr and MRI of the brain March 11, 2011  FINDINGS: No reduced diffusion to suggest acute ischemia. No susceptibility artifact to suggest hemorrhage. No midline shift or mass effect. Ventriculoperitoneal shunt via RIGHT occipital burr hole, distal tip at the level the RIGHT lateral ventricle/ foramen of Monro, slightly RIGHT lateral ventricle is unchanged. No hydrocephalus. A few scattered subcentimeter supratentorial white matter T2 hyperintensities are similar.  No abnormal extra-axial fluid collections. Normal major intracranial vascular flow voids at the skull.  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 Status post RIGHT ocular on lens implant. Mild paranasal sinus mucosal thickening without air-fluid levels. Mastoid air cells are well aerated. No abnormal sellar expansion. No cerebellar tonsillar ectopia.  IMPRESSION: No acute intracranial process, specifically no acute ischemia nor hydrocephalus, similar appearance of ventricles. Ventriculoperitoneal shunt in situ.  Mild white matter changes can be seen with chronic small vessel ischemic disease.   Electronically Signed   By: Elon Alas   On: 04/09/2014 22:57    Review of Systems  Constitutional: Negative for fever, chills, weight loss, malaise/fatigue and diaphoresis.  HENT: Negative for congestion, ear discharge, ear pain, hearing loss, nosebleeds, sore throat and tinnitus.   Eyes: Negative for blurred vision, double vision, photophobia, pain, discharge and redness.  Respiratory: Negative for cough, hemoptysis, sputum production, shortness of breath, wheezing and stridor.   Cardiovascular: Positive for chest pain. Negative for palpitations, orthopnea, claudication, leg swelling and PND.       Chest pain, left sided, twinge, lasting seconds only,   Gastrointestinal: Positive for heartburn. Negative for nausea, vomiting, abdominal pain, diarrhea, constipation, blood in stool  and melena.  Genitourinary: Negative for dysuria, urgency, frequency, hematuria and flank pain.  Musculoskeletal: Negative for myalgias, back pain, joint pain, falls and neck pain.  Skin: Negative for itching and rash.  Neurological: Positive for dizziness. Negative for tingling, tremors, sensory change, speech change, focal weakness, seizures, loss of consciousness, weakness and headaches.  Endo/Heme/Allergies: Negative for environmental allergies and polydipsia. Does not bruise/bleed easily.  Psychiatric/Behavioral: Negative for depression, suicidal ideas, hallucinations, memory loss and substance abuse. The patient is not nervous/anxious and does not have insomnia.     Blood pressure 129/61, pulse 79, temperature 98.4 F (36.9 C), temperature source Oral, resp. rate 17, SpO2 95 %. Physical Exam  Constitutional: He is oriented to person, place, and time. He appears well-developed and well-nourished.  HENT:  Head: Normocephalic and atraumatic.  Mouth/Throat: No oropharyngeal exudate.  Eyes: Conjunctivae and EOM are normal. Pupils are equal, round, and reactive to light. Right eye exhibits no discharge. Left eye exhibits no discharge. No scleral icterus.  Neck: Normal range of motion. Neck supple. No JVD present. No tracheal deviation present. No thyromegaly present.  Cardiovascular: Normal rate and regular rhythm.  Exam reveals no gallop and no friction rub.   No murmur heard. Respiratory: Effort normal and breath sounds normal. No stridor. No respiratory distress. He has no wheezes. He has no rales. He exhibits no tenderness.  GI: Soft. Bowel sounds are normal. He exhibits no distension. There is no tenderness. There is no rebound and no guarding.  Musculoskeletal: Normal range of motion. He exhibits no edema or tenderness.  Lymphadenopathy:    He has no cervical adenopathy.  Neurological: He is alert and oriented to person, place, and time. He has normal reflexes. He displays normal  reflexes. No cranial nerve deficit. He exhibits normal muscle tone. Coordination normal.  Skin: Skin is warm and dry. No rash noted. No erythema. No pallor.  Psychiatric: He has a normal mood and affect. His behavior is normal. Judgment and thought content normal.     Assessment/Plan Dizziness ? Secondary to nph, vs hyponatremia Telemetry Check carotid ultrasound Check echo Please call Dr. Ellene Route in the AM to adjust his shunt,  Per Dr. Joya Salm who spoke with ED.   Hyponatremia Check tsh cortisol, serum osm, urine sodium , urine osm Might  be due to hydrochlorothiazide.   Hypertension Stop hydrochlorothiazide Try atenolol 12.2m po qday Continue with lisinopril Cont with amlodipine  Dm2: fsbs ac and qhs, iss  CP Sounds atypical in that lasting only seconds Tele + acid reflux,  Cycle cardiac markers Check echo  Christopher Conner, S[place]12/22/2015, 12:11 AM

## 2014-04-10 NOTE — Procedures (Signed)
[name] is a 63 year old individual who is well-known to my service. He has had communicating hydrocephalus for a number of years and has had a ventriculoperitoneal shunt during that time. Every 5 years or so he has an episode where he becomes violently ill generally this is short-lived. The shunt function is always brought into question. Yesterday he had one such episode where he became violently ill and he was seen in the emergency room he underwent an MRI study this requires reprogramming of the ventriculoperitoneal shunt is a Codman adjustable shunt.  Procedure reprogramming of Codman ventriculoperitoneal shunt. The shunt was reprogrammed with a Codman transcutaneous programmer at to drain an opening pressure of 90 mm of water without difficulty.

## 2014-04-10 NOTE — Progress Notes (Signed)
Echo Lab  2D Echocardiogram completed.  Gould, RDCS 04/10/2014 3:26 PM

## 2014-04-11 LAB — CORTISOL: CORTISOL PLASMA: 15.5 ug/dL

## 2015-05-27 ENCOUNTER — Other Ambulatory Visit (HOSPITAL_COMMUNITY)
Admission: RE | Admit: 2015-05-27 | Discharge: 2015-05-27 | Disposition: A | Discharge status: Home / Self Care | Payer: BLUE CROSS/BLUE SHIELD | Source: Ambulatory Visit | Attending: Hematology | Admitting: Hematology

## 2015-05-27 ENCOUNTER — Telehealth: Payer: Self-pay | Admitting: Hematology

## 2015-05-27 ENCOUNTER — Encounter: Payer: Self-pay | Admitting: Hematology

## 2015-05-27 ENCOUNTER — Ambulatory Visit (HOSPITAL_BASED_OUTPATIENT_CLINIC_OR_DEPARTMENT_OTHER): Payer: BLUE CROSS/BLUE SHIELD | Admitting: Hematology

## 2015-05-27 ENCOUNTER — Ambulatory Visit (HOSPITAL_BASED_OUTPATIENT_CLINIC_OR_DEPARTMENT_OTHER): Payer: BLUE CROSS/BLUE SHIELD

## 2015-05-27 VITALS — BP 154/62 | HR 72 | Temp 98.1°F | Resp 18 | Ht 70.0 in | Wt 191.3 lb

## 2015-05-27 DIAGNOSIS — D72820 Lymphocytosis (symptomatic): Secondary | ICD-10-CM

## 2015-05-27 DIAGNOSIS — K5900 Constipation, unspecified: Secondary | ICD-10-CM

## 2015-05-27 LAB — CBC & DIFF AND RETIC
BASO%: 0.2 % (ref 0.0–2.0)
Basophils Absolute: 0 10*3/uL (ref 0.0–0.1)
EOS%: 0.7 % (ref 0.0–7.0)
Eosinophils Absolute: 0.1 10*3/uL (ref 0.0–0.5)
HCT: 38.1 % — ABNORMAL LOW (ref 38.4–49.9)
HGB: 13.3 g/dL (ref 13.0–17.1)
Immature Retic Fract: 3.6 % (ref 3.00–10.60)
LYMPH#: 8.8 10*3/uL — AB (ref 0.9–3.3)
LYMPH%: 58.4 % — ABNORMAL HIGH (ref 14.0–49.0)
MCH: 32.2 pg (ref 27.2–33.4)
MCHC: 34.9 g/dL (ref 32.0–36.0)
MCV: 92.3 fL (ref 79.3–98.0)
MONO#: 0.7 10*3/uL (ref 0.1–0.9)
MONO%: 4.4 % (ref 0.0–14.0)
NEUT#: 5.5 10*3/uL (ref 1.5–6.5)
NEUT%: 36.3 % — ABNORMAL LOW (ref 39.0–75.0)
NRBC: 0 % (ref 0–0)
Platelets: 294 10*3/uL (ref 140–400)
RBC: 4.13 10*6/uL — ABNORMAL LOW (ref 4.20–5.82)
RDW: 12.6 % (ref 11.0–14.6)
RETIC %: 1.57 % (ref 0.80–1.80)
Retic Ct Abs: 64.84 10*3/uL (ref 34.80–93.90)
WBC: 15 10*3/uL — AB (ref 4.0–10.3)

## 2015-05-27 LAB — COMPREHENSIVE METABOLIC PANEL
ALBUMIN: 4.2 g/dL (ref 3.5–5.0)
ALK PHOS: 53 U/L (ref 40–150)
ALT: 31 U/L (ref 0–55)
AST: 33 U/L (ref 5–34)
Anion Gap: 9 mEq/L (ref 3–11)
BUN: 17.8 mg/dL (ref 7.0–26.0)
CHLORIDE: 99 meq/L (ref 98–109)
CO2: 26 mEq/L (ref 22–29)
Calcium: 9.2 mg/dL (ref 8.4–10.4)
Creatinine: 0.9 mg/dL (ref 0.7–1.3)
EGFR: 85 mL/min/{1.73_m2} — AB (ref >90)
Glucose: 122 mg/dl (ref 70–140)
POTASSIUM: 4.2 meq/L (ref 3.5–5.1)
SODIUM: 134 meq/L — AB (ref 136–145)
Total Bilirubin: 0.36 mg/dL (ref 0.20–1.20)
Total Protein: 7.1 g/dL (ref 6.4–8.3)

## 2015-05-27 LAB — CHCC SMEAR

## 2015-05-27 LAB — LACTATE DEHYDROGENASE: LDH: 190 U/L (ref 125–245)

## 2015-05-27 LAB — TECHNOLOGIST REVIEW

## 2015-05-27 NOTE — Telephone Encounter (Signed)
Pt confirmed appt for this afternoon, pt wants to the md to be aware of an xray that was completed on in connection with dx on Friday

## 2015-05-27 NOTE — Telephone Encounter (Signed)
per po fto sch pt appt-gave pt copy of avs-sent back to lab °

## 2015-05-28 LAB — SEDIMENTATION RATE: Sedimentation Rate-Westergren: 2 mm/hr (ref 0–30)

## 2015-05-28 LAB — HEPATITIS C ANTIBODY: Hep C Virus Ab: <0.1 s/co ratio (ref 0.0–0.9)

## 2015-05-28 LAB — HIV ANTIBODY (ROUTINE TESTING W REFLEX): HIV Screen 4th Generation wRfx: NONREACTIVE

## 2015-05-28 LAB — TSH: TSH: 1.245 m[IU]/L (ref 0.320–4.118)

## 2015-06-03 LAB — FLOW CYTOMETRY

## 2015-06-05 ENCOUNTER — Telehealth: Payer: Self-pay | Admitting: Hematology

## 2015-06-05 ENCOUNTER — Encounter: Payer: Self-pay | Admitting: Hematology

## 2015-06-05 ENCOUNTER — Ambulatory Visit (HOSPITAL_BASED_OUTPATIENT_CLINIC_OR_DEPARTMENT_OTHER): Payer: BLUE CROSS/BLUE SHIELD | Admitting: Hematology

## 2015-06-05 VITALS — BP 156/65 | HR 71 | Temp 97.8°F | Resp 16

## 2015-06-05 DIAGNOSIS — K5900 Constipation, unspecified: Secondary | ICD-10-CM | POA: Diagnosis not present

## 2015-06-05 DIAGNOSIS — D72820 Lymphocytosis (symptomatic): Secondary | ICD-10-CM

## 2015-06-05 DIAGNOSIS — C9110 Chronic lymphocytic leukemia of B-cell type not having achieved remission: Secondary | ICD-10-CM | POA: Diagnosis not present

## 2015-06-05 NOTE — Telephone Encounter (Signed)
per pof to sch pt appt-gave pt copy of avs °

## 2015-06-09 NOTE — Progress Notes (Signed)
[name]    HEMATOLOGY/ONCOLOGY CONSULTATION NOTE  Date of Service: .05/27/2015   Patient Care Team: Thressa Sheller, MD as PCP - General (Internal Medicine)  CHIEF COMPLAINTS/PURPOSE OF CONSULTATION:  lymphocytosis  HISTORY OF PRESENTING ILLNESS:   Christopher Conner is a wonderful 65 y.o. male who has been referred to [place] by Dr .Thressa Sheller, MD  for evaluation and management of lymphocytosis.  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 The patient was noted to have a WBC count of 11.6k in January of 2016 with 55% lymphocytes.  Subsequent labs and July 2016 showed total WBC count of 13k with about 60% lymphocytes and then on 05/17/2015 he was noted to have total WBC count of 14.2k with 68% lymphocytes which eventually lea to a hematology referral. Patient notes no fevers or chills.  Has had some intermittent night sweats for about a year and a half ( not associated with overt hypoglycemia].  Notes no enlarged lymph nodes.  No unexpected weight loss. His labs did not show significant anemia or thrombocytopenia. Hemoglobin of 12.4 with platelets of 310k. Patient notes that since earlier in January 2017 he has noted new onset constipation and has tried several different laxatives he was then started on Linzess. He reports having had an abdominal x-ray that did not show overt obstruction.  He has been scheduled for a colonoscopy later in February 2017 for further evaluation.    MEDICAL HISTORY:  Past Medical History  Diagnosis Date  . Diabetes mellitus without complication ([place])   . Hypertension   . High cholesterol   . Normal pressure hydrocephalus   . Hyponatremia   . GERD (gastroesophageal reflux disease)     SURGICAL HISTORY: Past Surgical History  Procedure Laterality Date  .  Ventriculoperitoneal shunt    . Back surgery    . Lumbar fusion    . Appendectomy    . Cyst removal neck    . Cataract extraction      left eye    SOCIAL HISTORY: Social History   Social History  . Marital Status: Married    Spouse Name: [name]  . Number of Children: [name]  . Years of Education: [name]   Occupational History  . systems Counsellor (employer)   Social History Main Topics  . Smoking status: Never Smoker   . Smokeless tobacco: Not on file  . Alcohol Use: No  . Drug Use: No  . Sexual Activity: Not on file   Other Topics Concern  . Not on file   Social History Narrative    FAMILY HISTORY: Family History  Problem Relation Age of Onset  . Hypertension Mother   . Hyperlipidemia Mother   . Diabetes Mother   . Diabetes Father     ALLERGIES:  has No Known Allergies.  MEDICATIONS:  Current Outpatient Prescriptions  Medication Sig Dispense Refill  . Alpha-Lipoic Acid 600 MG CAPS Take by mouth.    [name] amLODipine (NORVASC) 10 MG tablet Take 10 mg by mouth daily.    [name] aspirin 81 MG tablet Take 81 mg by mouth daily.    . insulin lispro (HUMALOG) 100 UNIT/ML injection Inject into the skin 3 (three) times daily before meals. Cont pump    . lisinopril (PRINIVIL,ZESTRIL) 40 MG tablet Take 1 tablet (40 mg total) by mouth  daily. (Patient taking differently: Take 12.5 mg by mouth daily. Take 2 tablets by mouth daily) 30 tablet 0  . pravastatin (PRAVACHOL) 40 MG tablet Take 40 mg by mouth daily.     No current facility-administered medications for this visit.    REVIEW OF SYSTEMS:    10 Point review of Systems was done is negative except as noted above.  PHYSICAL EXAMINATION: ECOG PERFORMANCE STATUS: 1 - Symptomatic but completely ambulatory  . Filed Vitals:   05/27/15 1413  BP: 154/62  Pulse: 72  Temp: 98.1 F (36.7 C)  Resp: 18   Filed Weights   05/27/15 1413  Weight: 191 lb 4.8 oz (86.773 kg)   .Body mass index is 27.45  kg/(m^2).  GENERAL:alert, in no acute distress and comfortable SKIN: skin color, texture, turgor are normal, no rashes or significant lesions EYES: normal, conjunctiva are pink and non-injected, sclera clear OROPHARYNX:no exudate, no erythema and lips, buccal mucosa, and tongue normal  NECK: supple, no JVD, thyroid normal size, non-tender, without nodularity LYMPH:  no palpable lymphadenopathy in the cervical, axillary or inguinal LUNGS: clear to auscultation with normal respiratory effort HEART: regular rate & rhythm,  no murmurs and no lower extremity edema ABDOMEN: abdomen soft, non-tender, normoactive bowel sounds  Musculoskeletal: no cyanosis of digits and no clubbing  PSYCH: alert & oriented x 3 with fluent speech NEURO: no focal motor/sensory deficits  LABORATORY DATA:  I have reviewed the data as listed  . CBC Latest Ref Rng 05/27/2015 04/10/2014 04/09/2014  WBC 4.0 - 10.3 10e3/uL 15.0(H) 10.7(H) 14.8(H)  Hemoglobin 13.0 - 17.1 g/dL 13.3 11.5(L) 13.2  Hematocrit 38.4 - 49.9 % 38.1(L) 33.4(L) 37.9(L)  Platelets 140 - 400 10e3/uL 294 239 268   . CBC    Component Value Date/Time   WBC 15.0* 05/27/2015 1555   WBC 10.7* 04/10/2014 0656   RBC 4.13* 05/27/2015 1555   RBC 3.69* 04/10/2014 0656   HGB 13.3 05/27/2015 1555   HGB 11.5* 04/10/2014 0656   HCT 38.1* 05/27/2015 1555   HCT 33.4* 04/10/2014 0656   PLT 294 05/27/2015 1555   PLT 239 04/10/2014 0656   MCV 92.3 05/27/2015 1555   MCV 90.5 04/10/2014 0656   MCH 32.2 05/27/2015 1555   MCH 31.2 04/10/2014 0656   MCHC 34.9 05/27/2015 1555   MCHC 34.4 04/10/2014 0656   RDW 12.6 05/27/2015 1555   RDW 12.3 04/10/2014 0656   LYMPHSABS 8.8* 05/27/2015 1555   LYMPHSABS 5.1* 04/09/2014 1624   MONOABS 0.7 05/27/2015 1555   MONOABS 0.7 04/09/2014 1624   EOSABS 0.1 05/27/2015 1555   EOSABS 0.1 04/09/2014 1624   BASOSABS 0.0 05/27/2015 1555   BASOSABS 0.0 04/09/2014 1624     CMP Latest Ref Rng 05/27/2015 04/10/2014 04/09/2014   Glucose 70 - 140 mg/dl 122 187(H) 167(H)  BUN 7.0 - 26.0 mg/dL 17.8 18 26(H)  Creatinine 0.7 - 1.3 mg/dL 0.9 0.85 0.83  Sodium 136 - 145 mEq/L 134(L) 128(L) 128(L)  Potassium 3.5 - 5.1 mEq/L 4.2 3.9 4.3  Chloride 96 - 112 mEq/L - 97 90(L)  CO2 22 - 29 mEq/L 26 24 24   Calcium 8.4 - 10.4 mg/dL 9.2 8.3(L) 9.4  Total Protein 6.4 - 8.3 g/dL 7.1 5.3(L) 7.1  Total Bilirubin 0.20 - 1.20 mg/dL 0.36 0.8 0.5  Alkaline Phos 40 - 150 U/L 53 43 58  AST 5 - 34 U/L 33 26 34  ALT 0 - 55 U/L 31 23 27    Peripheral blood smear: (personally reiewed  by me) Normocytic red cells, no increased schistocytes or micro-spherocytes.  Adequate platelets.  Increased mature appearing lymphocytes.  No increased blasts.  Some smudge cells noted.  Concerning for possible CLL.  RADIOGRAPHIC STUDIES: I have personally reviewed the radiological images as listed and agreed with the findings in the report. No results found.  ASSESSMENT & PLAN:   Christopher Conner is a very pleasant 65 year old Caucasian gentleman with  1) Increasing lymphocytosis for atleast about 1 year possibly longer. No evidence of chronic infections or inflammation. Overall picture and peripheral blood smear suggestive of CLL. Absolute lymphocyte count of 8.8k with nl Hgb and platelets. Mild intermitent night sweats but no palpable LNadenopathy or overt hepatosplenomegaly. Plan -peripheral blood flow cytometry due to concern for CLL -peripheral blood smear reviewed -HIV, hep C, LDH, TSH,sed rate. -further management based on results of diagnostic testing. -in the absence of cytopenias or overtly bothersome constitutional symptoms if this does turn out to be CLL it might not immediately require treatment. -discussed with patient the plan of testing and he is agreeable with this plan  2) New onset constipation. Being workup by his PCP. On laxative and Linzess to address this Plan -patient notes that he has been scheduled for a colonoscopy - plz forward [place]  a copy of these results -might consider CT abd/pelvis to evaluate for other possible structural issues that might be causing constipation. Patient has VP shunt that could conceivably cause adhesions and partial bowel obstruction. -further workup as per PCP.  3) HTN/DM2/Dyslipidimia -continue f/u with PCP.    [name] Orders Placed This Encounter  Procedures  . CBC & Diff and Retic    Standing Status: Future     Number of Occurrences: 1     Standing Expiration Date: 06/30/2016  . Comprehensive metabolic panel    Standing Status: Future     Number of Occurrences: 1     Standing Expiration Date: 05/26/2016  . Smear    Standing Status: Future     Number of Occurrences: 1     Standing Expiration Date: 05/26/2016  . Lactate dehydrogenase (LDH) - [facility]    Standing Status: Future     Number of Occurrences: 1     Standing Expiration Date: 05/26/2016  . TSH    Standing Status: Future     Number of Occurrences: 1     Standing Expiration Date: 05/26/2016  . Hepatitis C antibody    Standing Status: Future     Number of Occurrences: 1     Standing Expiration Date: 05/26/2016  . HIV antibody (with reflex)    Standing Status: Future     Number of Occurrences: 1     Standing Expiration Date: 05/26/2016  . Lactate dehydrogenase (LDH) - [facility]    Standing Status: Future     Number of Occurrences: 1     Standing Expiration Date: 05/26/2016  . Flow Cytometry    Lymphocytosis r/o lymphoproliferative disorder.    Standing Status: Future     Number of Occurrences: 1     Standing Expiration Date: 05/26/2016  . Sedimentation rate    Standing Status: Future     Number of Occurrences: 1     Standing Expiration Date: 05/26/2016     All of the patients questions were answered to his apparent satisfaction. The patient knows to call the clinic with any problems, questions or concerns.  I spent 55 minutes counseling the patient face to face. The total time spent in the appointment was 34  minutes and more than 50% was on  counseling and direct patient cares.    Sullivan Lone MD [place] AAHIVMS [facility] Hematology/Oncology Physician [facility]  (Office):       [phone] (Work cell):  [phone] (Fax):           [phone]

## 2015-06-09 NOTE — Progress Notes (Signed)
Christopher Conner    HEMATOLOGY/ONCOLOGY CLINIC NOTE  Date of Service: .06/05/2015   Patient Care Team: Christopher Sheller, MD as PCP - General (Internal Medicine)  CHIEF COMPLAINTS: f/u for lymphocytosis  HISTORY OF PRESENTING ILLNESS: plz see initial consultation for details on initial presentation  INTERVAL HISTORY  Christopher Conner is here for his scheduled followup.  He notes that he has stopped taking several office over-the-counter supplements and this has helped improve his night sweats.  Notes no other acute new symptoms.  He still notes that he is bothered by constipation.  Notes that he to [facility] for this upcoming colonoscopy prep but does not feel that it is clearing his bowels. No new lymphadenopathy.  No other new concerns in the interim since his last visit.    MEDICAL HISTORY:  Past Medical History  Diagnosis Date  . Diabetes mellitus without complication ([place])   . Hypertension   . High cholesterol   . Normal pressure hydrocephalus   . Hyponatremia   . GERD (gastroesophageal reflux disease)     SURGICAL HISTORY: Past Surgical History  Procedure Laterality Date  . Ventriculoperitoneal shunt    . Back surgery    . Lumbar fusion    . Appendectomy    . Cyst removal neck    . Cataract extraction      left eye    SOCIAL HISTORY: Social History   Social History  . Marital Status: Married    Spouse Name: [name]  . Number of Children: [name]  . Years of Education: [name]   Occupational History  . systems Counsellor (employer)   Social History Main Topics  . Smoking status: Never Smoker   . Smokeless tobacco: Not on file  . Alcohol Use: No  . Drug Use: No  . Sexual Activity: Not on file   Other Topics Concern  . Not on file   Social History Narrative    FAMILY HISTORY: Family History  Problem Relation Age of Onset  . Hypertension Mother   . Hyperlipidemia Mother   . Diabetes Mother   . Diabetes Father     ALLERGIES:  has No Known  Allergies.  MEDICATIONS:  Current Outpatient Prescriptions  Medication Sig Dispense Refill  . Alpha-Lipoic Acid 600 MG CAPS Take by mouth.    Christopher Conner amLODipine (NORVASC) 10 MG tablet Take 10 mg by mouth daily.    Christopher Conner aspirin 81 MG tablet Take 81 mg by mouth daily.    . insulin lispro (HUMALOG) 100 UNIT/ML injection Inject into the skin 3 (three) times daily before meals. Cont pump    . lisinopril (PRINIVIL,ZESTRIL) 40 MG tablet Take 1 tablet (40 mg total) by mouth daily. (Patient taking differently: Take 12.5 mg by mouth daily. Take 2 tablets by mouth daily) 30 tablet 0  . pravastatin (PRAVACHOL) 40 MG tablet Take 40 mg by mouth daily.     No current facility-administered medications for this visit.    REVIEW OF SYSTEMS:    10 Point review of Systems was done is negative except as noted above.  PHYSICAL EXAMINATION: ECOG PERFORMANCE STATUS: 1 - Symptomatic but completely ambulatory  . Filed Vitals:   06/05/15 1013  BP: 156/65  Pulse: 71  Temp: 97.8 F (36.6 C)  Resp: 16   There were no vitals filed for this visit. .There is no weight on file to calculate BMI.  GENERAL:alert, in no acute distress and comfortable SKIN: skin color, texture, turgor are normal, no rashes or  significant lesions EYES: normal, conjunctiva are pink and non-injected, sclera clear OROPHARYNX:no exudate, no erythema and lips, buccal mucosa, and tongue normal  NECK: supple, no JVD, thyroid normal size, non-tender, without nodularity LYMPH:  no palpable lymphadenopathy in the cervical, axillary or inguinal LUNGS: clear to auscultation with normal respiratory effort HEART: regular rate & rhythm,  no murmurs and no lower extremity edema ABDOMEN: abdomen soft, non-tender, normoactive bowel sounds ,no palpable hepatosplenomegaly Musculoskeletal: no cyanosis of digits and no clubbing  PSYCH: alert & oriented x 3 with fluent speech NEURO: no focal motor/sensory deficits  LABORATORY DATA:  I have reviewed the  data as listed  . CBC Latest Ref Rng 05/27/2015 04/10/2014 04/09/2014  WBC 4.0 - 10.3 10e3/uL 15.0(H) 10.7(H) 14.8(H)  Hemoglobin 13.0 - 17.1 g/dL 13.3 11.5(L) 13.2  Hematocrit 38.4 - 49.9 % 38.1(L) 33.4(L) 37.9(L)  Platelets 140 - 400 10e3/uL 294 239 268   . CBC    Component Value Date/Time   WBC 15.0* 05/27/2015 1555   WBC 10.7* 04/10/2014 0656   RBC 4.13* 05/27/2015 1555   RBC 3.69* 04/10/2014 0656   HGB 13.3 05/27/2015 1555   HGB 11.5* 04/10/2014 0656   HCT 38.1* 05/27/2015 1555   HCT 33.4* 04/10/2014 0656   PLT 294 05/27/2015 1555   PLT 239 04/10/2014 0656   MCV 92.3 05/27/2015 1555   MCV 90.5 04/10/2014 0656   MCH 32.2 05/27/2015 1555   MCH 31.2 04/10/2014 0656   MCHC 34.9 05/27/2015 1555   MCHC 34.4 04/10/2014 0656   RDW 12.6 05/27/2015 1555   RDW 12.3 04/10/2014 0656   LYMPHSABS 8.8* 05/27/2015 1555   LYMPHSABS 5.1* 04/09/2014 1624   MONOABS 0.7 05/27/2015 1555   MONOABS 0.7 04/09/2014 1624   EOSABS 0.1 05/27/2015 1555   EOSABS 0.1 04/09/2014 1624   BASOSABS 0.0 05/27/2015 1555   BASOSABS 0.0 04/09/2014 1624     CMP Latest Ref Rng 05/27/2015 04/10/2014 04/09/2014  Glucose 70 - 140 mg/dl 122 187(H) 167(H)  BUN 7.0 - 26.0 mg/dL 17.8 18 26(H)  Creatinine 0.7 - 1.3 mg/dL 0.9 0.85 0.83  Sodium 136 - 145 mEq/L 134(L) 128(L) 128(L)  Potassium 3.5 - 5.1 mEq/L 4.2 3.9 4.3  Chloride 96 - 112 mEq/L - 97 90(L)  CO2 22 - 29 mEq/L 26 24 24   Calcium 8.4 - 10.4 mg/dL 9.2 8.3(L) 9.4  Total Protein 6.4 - 8.3 g/dL 7.1 5.3(L) 7.1  Total Bilirubin 0.20 - 1.20 mg/dL 0.36 0.8 0.5  Alkaline Phos 40 - 150 U/L 53 43 58  AST 5 - 34 U/L 33 26 34  ALT 0 - 55 U/L 31 23 27    Component     Latest Ref Rng 05/27/2015  LDH     125 - 245 U/L 190  TSH     0.320 - 4.118 m(IU)/L 1.245  Hep C Virus Ab     0.0 - 0.9 s/co ratio <0.1  HIV     Non Reactive Non Reactive  Sed Rate     0 - 30 mm/hr 2       RADIOGRAPHIC STUDIES: I have personally reviewed the radiological images as  listed and agreed with the findings in the report. No results found.  ASSESSMENT & PLAN:   Mr Biedron is a very pleasant 65 year old Caucasian gentleman with  1) Rai Stage 0 CLL with lymphocytosis with no overt LNadenopathy or palpable hepatosplenomegaly. Absolute lymphocyte count of 8.8k with about 79 percent clonal lymphocytes on flow cytometry consistent with CLL. nl  Hgb and platelets. Mild intermitent night sweats but these have improved with cessation of some of his OTC nutritional supplements. HIV and Hep C neg TSH wnl Sed rate and LDH WNL Plan -We spent 35 min. Discussing the patient's diagnosis, staging, natural history of CLL, indications for treatment, prognostic factors and answering all his questions. -in the absence of cytopenias or overtly bothersome constitutional symptoms or bothersome LNpathy or hepatosplenomegaly there are no clear indications to treat his CLL at this time. -we discussed the  Signs/symptoms of progression to look for including manifestation of a possible Richter's transformation. - he is agreeable to [place] testing a peripheral blood CLL FISH prognostic panel prior to his next clinic visit. -we will reassess his clinical status , blood counts and CLL FISH panel in about 3-4 months.   2) New onset constipation. Being workup by his PCP. On laxative and Linzess to address this Plan -patient notes that he has been scheduled for a colonoscopy - plz forward [place] a copy of these results -might consider CT abd/pelvis to evaluate for other possible structural issues that might be causing constipation. Patient has VP shunt that could conceivably cause adhesions and partial bowel obstruction. -further workup as per PCP.  RTC with Dr Irene Limbo in 3-4 months with cbc, cmp, LDH and CLL FISH prognostic panel. Earlier if any new concerns or concerning findings on colonoscopy/abdominal imaging as part of workup for constipation.   . Orders Placed This Encounter  Procedures  . CBC  & Diff and Retic    Standing Status: Future     Number of Occurrences:      Standing Expiration Date: 07/09/2016  . Comprehensive metabolic panel    Standing Status: Future     Number of Occurrences:      Standing Expiration Date: 06/04/2016  . FISH, CLL Prognostic Panel    Standing Status: Future     Number of Occurrences:      Standing Expiration Date: 06/04/2016  . Lactate dehydrogenase (LDH) - [facility]    Standing Status: Future     Number of Occurrences:      Standing Expiration Date: 06/08/2016     All of the patients questions were answered to his apparent satisfaction. The patient knows to call the clinic with any problems, questions or concerns.  I spent 25 minutes counseling the patient face to face. The total time spent in the appointment was 30 minutes and more than 50% was on counseling and direct patient cares.    Sullivan Lone MD [place] AAHIVMS [facility] Hematology/Oncology Physician [facility]  (Office):       [phone] (Work cell):  [phone] (Fax):           [phone]

## 2015-06-10 ENCOUNTER — Ambulatory Visit: Payer: BLUE CROSS/BLUE SHIELD | Admitting: Hematology

## 2015-06-12 ENCOUNTER — Other Ambulatory Visit: Payer: Self-pay | Admitting: Gastroenterology

## 2015-06-12 DIAGNOSIS — K5909 Other constipation: Secondary | ICD-10-CM

## 2015-06-12 DIAGNOSIS — R194 Change in bowel habit: Secondary | ICD-10-CM

## 2015-06-13 ENCOUNTER — Ambulatory Visit
Admission: RE | Admit: 2015-06-13 | Discharge: 2015-06-13 | Disposition: A | Discharge status: Home / Self Care | Payer: BLUE CROSS/BLUE SHIELD | Source: Ambulatory Visit | Attending: Gastroenterology | Admitting: Gastroenterology

## 2015-06-13 DIAGNOSIS — K5909 Other constipation: Secondary | ICD-10-CM

## 2015-06-13 DIAGNOSIS — R194 Change in bowel habit: Secondary | ICD-10-CM

## 2015-06-13 MED ORDER — IOPAMIDOL (ISOVUE-300) INJECTION 61%
100.0000 mL | Freq: Once | INTRAVENOUS | Status: AC | PRN
  Administered 2015-06-13: 100 mL via INTRAVENOUS

## 2015-07-03 ENCOUNTER — Encounter: Payer: Self-pay | Admitting: Hematology

## 2015-07-03 NOTE — Progress Notes (Signed)
Patient called in with a billing concern from an EOB he received. He states he has not received an actual bill. Advised patient once he does, there is a number on the bill to contact billing to explain further and in detail and can help him set up payment arrangements as he discussed. Patient agreed and has my number for any additional financial questions or concerns.

## 2015-08-15 ENCOUNTER — Emergency Department (HOSPITAL_COMMUNITY): Payer: BLUE CROSS/BLUE SHIELD

## 2015-08-15 ENCOUNTER — Encounter (HOSPITAL_COMMUNITY): Payer: Self-pay | Admitting: *Deleted

## 2015-08-15 ENCOUNTER — Emergency Department (HOSPITAL_COMMUNITY)
Admission: EM | Admit: 2015-08-15 | Discharge: 2015-08-16 | Disposition: A | Discharge status: Home / Self Care | Payer: BLUE CROSS/BLUE SHIELD | Attending: Emergency Medicine | Admitting: Emergency Medicine

## 2015-08-15 DIAGNOSIS — R51 Headache: Secondary | ICD-10-CM | POA: Insufficient documentation

## 2015-08-15 DIAGNOSIS — Z7982 Long term (current) use of aspirin: Secondary | ICD-10-CM | POA: Diagnosis not present

## 2015-08-15 DIAGNOSIS — I10 Essential (primary) hypertension: Secondary | ICD-10-CM | POA: Insufficient documentation

## 2015-08-15 DIAGNOSIS — R112 Nausea with vomiting, unspecified: Secondary | ICD-10-CM | POA: Diagnosis present

## 2015-08-15 DIAGNOSIS — E7800 Pure hypercholesterolemia, unspecified: Secondary | ICD-10-CM | POA: Diagnosis not present

## 2015-08-15 DIAGNOSIS — B349 Viral infection, unspecified: Secondary | ICD-10-CM | POA: Insufficient documentation

## 2015-08-15 DIAGNOSIS — Z79899 Other long term (current) drug therapy: Secondary | ICD-10-CM | POA: Diagnosis not present

## 2015-08-15 DIAGNOSIS — Z856 Personal history of leukemia: Secondary | ICD-10-CM | POA: Diagnosis not present

## 2015-08-15 DIAGNOSIS — Z8669 Personal history of other diseases of the nervous system and sense organs: Secondary | ICD-10-CM | POA: Insufficient documentation

## 2015-08-15 DIAGNOSIS — Z794 Long term (current) use of insulin: Secondary | ICD-10-CM | POA: Insufficient documentation

## 2015-08-15 DIAGNOSIS — K5909 Other constipation: Secondary | ICD-10-CM | POA: Insufficient documentation

## 2015-08-15 DIAGNOSIS — E119 Type 2 diabetes mellitus without complications: Secondary | ICD-10-CM | POA: Insufficient documentation

## 2015-08-15 HISTORY — DX: Chronic lymphocytic leukemia of B-cell type not having achieved remission: C91.10

## 2015-08-15 LAB — COMPREHENSIVE METABOLIC PANEL
ALT: 40 U/L (ref 17–63)
ANION GAP: 14 (ref 5–15)
AST: 48 U/L — AB (ref 15–41)
Albumin: 4.2 g/dL (ref 3.5–5.0)
Alkaline Phosphatase: 52 U/L (ref 38–126)
BUN: 22 mg/dL — AB (ref 6–20)
CHLORIDE: 89 mmol/L — AB (ref 101–111)
CO2: 22 mmol/L (ref 22–32)
Calcium: 9.6 mg/dL (ref 8.9–10.3)
Creatinine, Ser: 1.05 mg/dL (ref 0.61–1.24)
GFR calc Af Amer: >60 mL/min (ref >60)
Glucose, Bld: 242 mg/dL — ABNORMAL HIGH (ref 65–99)
POTASSIUM: 4.3 mmol/L (ref 3.5–5.1)
Sodium: 125 mmol/L — ABNORMAL LOW (ref 135–145)
Total Bilirubin: 1.2 mg/dL (ref 0.3–1.2)
Total Protein: 7 g/dL (ref 6.5–8.1)

## 2015-08-15 LAB — CBC
HEMATOCRIT: 39.4 % (ref 39.0–52.0)
HEMOGLOBIN: 13.9 g/dL (ref 13.0–17.0)
MCH: 31.9 pg (ref 26.0–34.0)
MCHC: 35.3 g/dL (ref 30.0–36.0)
MCV: 90.4 fL (ref 78.0–100.0)
Platelets: 307 10*3/uL (ref 150–400)
RBC: 4.36 MIL/uL (ref 4.22–5.81)
RDW: 12 % (ref 11.5–15.5)
WBC: 16.7 10*3/uL — AB (ref 4.0–10.5)

## 2015-08-15 LAB — I-STAT TROPONIN, ED: Troponin i, poc: 0 ng/mL (ref 0.00–0.08)

## 2015-08-15 LAB — CBG MONITORING, ED: Glucose-Capillary: 229 mg/dL — ABNORMAL HIGH (ref 65–99)

## 2015-08-15 LAB — LIPASE, BLOOD: LIPASE: 22 U/L (ref 11–51)

## 2015-08-15 MED ORDER — ONDANSETRON 4 MG PO TBDP
4.0000 mg | ORAL_TABLET | Freq: Once | ORAL | Status: AC | PRN
  Administered 2015-08-15: 4 mg via ORAL

## 2015-08-15 MED ORDER — ONDANSETRON 4 MG PO TBDP
ORAL_TABLET | ORAL | Status: AC
  Filled 2015-08-15: qty 1

## 2015-08-15 MED ORDER — SODIUM CHLORIDE 0.9 % IV SOLN
INTRAVENOUS | Status: DC
  Administered 2015-08-15: via INTRAVENOUS

## 2015-08-15 MED ORDER — KETOROLAC TROMETHAMINE 30 MG/ML IJ SOLN
30.0000 mg | Freq: Once | INTRAMUSCULAR | Status: AC
  Administered 2015-08-15: 30 mg via INTRAVENOUS
  Filled 2015-08-15: qty 1

## 2015-08-15 MED ORDER — SODIUM CHLORIDE 0.9 % IV BOLUS (SEPSIS)
1000.0000 mL | Freq: Once | INTRAVENOUS | Status: AC
  Administered 2015-08-15: 1000 mL via INTRAVENOUS

## 2015-08-15 MED ORDER — ONDANSETRON HCL 4 MG/2ML IJ SOLN
4.0000 mg | Freq: Once | INTRAMUSCULAR | Status: AC
Start: 2015-08-15 — End: 2015-08-15
  Administered 2015-08-15: 4 mg via INTRAVENOUS
  Filled 2015-08-15: qty 2

## 2015-08-15 NOTE — ED Provider Notes (Addendum)
CSN: [id number]     Arrival date & time 08/15/15  1921 History   By signing my name below, I, Christopher Conner, attest that this documentation has been prepared under the direction and in the presence of Winna Golla, MD.  Electronically Signed: Forrestine Conner, ED Scribe. 08/15/2015. 11:18 PM.   Chief Complaint  Patient presents with  . Emesis   Patient is a 65 y.o. male presenting with vomiting. The history is provided by the patient. No language interpreter was used.  Emesis Severity:  Moderate Duration:  5 hours Timing:  Constant Quality:  Stomach contents Progression:  Unchanged Chronicity:  New Relieved by:  None tried Ineffective treatments:  None tried Associated symptoms: abdominal pain and myalgias   Associated symptoms: no diarrhea and no headaches   Risk factors: diabetes   Risk factors: no sick contacts     HPI Comments: Christopher Conner is a 65 y.o. male with a PMHx of DM, HTN, high cholesterol, and GERD who presents to the Emergency Department complaining of constant, ongoing vomiting with associated nausea onset 6:00 PM this evening. Several episodes of vomiting reported. Pt states vomiting is exacerbated with movement without any alleviating factors. He also reports photophobia, abdominal cramping, dizziness, myalgias, and chills. No OTC medications or home remedies attempted prior to arrival. No recent fever, rhinorrhea, chest pain, diarrhea, or shortness of breath. No known sick contacts, however, pt states he goes to the gym every morning. No known allergies to medications.  PCP: Thressa Sheller, MD    Past Medical History  Diagnosis Date  . Diabetes mellitus without complication ([place])   . Hypertension   . High cholesterol   . Normal pressure hydrocephalus   . Hyponatremia   . GERD (gastroesophageal reflux disease)   . Chronic lymphocytic leukemia ([place])    Past Surgical History  Procedure Laterality Date  . Ventriculoperitoneal shunt    . Back surgery    .  Lumbar fusion    . Appendectomy    . Cyst removal neck    . Cataract extraction      left eye   Family History  Problem Relation Age of Onset  . Hypertension Mother   . Hyperlipidemia Mother   . Diabetes Mother   . Diabetes Father    Social History  Substance Use Topics  . Smoking status: Never Smoker   . Smokeless tobacco: None  . Alcohol Use: No    Review of Systems  Constitutional: Negative for fever and diaphoresis.  Respiratory: Positive for cough. Negative for shortness of breath.   Cardiovascular: Negative for chest pain.  Gastrointestinal: Positive for nausea, vomiting and abdominal pain. Negative for diarrhea.  Musculoskeletal: Positive for myalgias. Negative for neck pain and neck stiffness.  Neurological: Negative for dizziness, seizures, facial asymmetry, numbness and headaches.  Psychiatric/Behavioral: Negative for confusion.  All other systems reviewed and are negative.     Allergies  Review of patient's allergies indicates no known allergies.  Home Medications   Prior to Admission medications   Medication Sig Start Date End Date Taking? Authorizing Provider  Alpha-Lipoic Acid 600 MG CAPS Take by mouth.    Historical Provider, MD  amLODipine (NORVASC) 10 MG tablet Take 10 mg by mouth daily.    Historical Provider, MD  aspirin 81 MG tablet Take 81 mg by mouth daily.    Historical Provider, MD  insulin lispro (HUMALOG) 100 UNIT/ML injection Inject into the skin 3 (three) times daily before meals. Cont pump    Historical Provider,  MD  lisinopril (PRINIVIL,ZESTRIL) 40 MG tablet Take 1 tablet (40 mg total) by mouth daily. Patient taking differently: Take 12.5 mg by mouth daily. Take 2 tablets by mouth daily 04/10/14   Annita Brod, MD  pravastatin (PRAVACHOL) 40 MG tablet Take 40 mg by mouth daily.    Historical Provider, MD   Triage Vitals: BP 173/71 mmHg  Pulse 101  Temp(Src) 97.9 F (36.6 C) (Oral)  Resp 16  Wt 188 lb (85.276 kg)  SpO2 100%    Physical Exam  Constitutional: He is oriented to person, place, and time. He appears well-developed and well-nourished. No distress.  Well appearing  HENT:  Head: Normocephalic and atraumatic.  Mouth/Throat: Oropharynx is clear and moist. No oropharyngeal exudate.  Eyes: EOM are normal. Pupils are equal, round, and reactive to light.  Neck: Normal range of motion. Neck supple.  No bruits noted no menigismus  Cardiovascular: Normal rate, regular rhythm, normal heart sounds and intact distal pulses.   Pulmonary/Chest: Effort normal and breath sounds normal. No stridor. No respiratory distress. He has no wheezes. He has no rales.  Abdominal: Soft. Bowel sounds are normal. He exhibits no distension. There is no tenderness. There is no rebound and no guarding.  Good bowel sounds   Musculoskeletal: Normal range of motion. He exhibits no edema or tenderness.  Lymphadenopathy:    He has no cervical adenopathy.  Neurological: He is alert and oriented to person, place, and time. He has normal reflexes. No cranial nerve deficit.  Skin: Skin is warm and dry. He is not diaphoretic.  Psychiatric: He has a normal mood and affect. Judgment normal.  Nursing note and vitals reviewed.   ED Course  Procedures (including critical care time)  DIAGNOSTIC STUDIES: Oxygen Saturation is 100% on RA, Normal by my interpretation.    COORDINATION OF CARE: 11:03 PM- Will order blood work, EKG, and urinalysis. Will give Zofran and fluids. Discussed treatment plan with pt at bedside and pt agreed to plan.     Labs Review Labs Reviewed  COMPREHENSIVE METABOLIC PANEL - Abnormal; Notable for the following:    Sodium 125 (*)    Chloride 89 (*)    Glucose, Bld 242 (*)    BUN 22 (*)    AST 48 (*)    All other components within normal limits  CBC - Abnormal; Notable for the following:    WBC 16.7 (*)    All other components within normal limits  CBG MONITORING, ED - Abnormal; Notable for the following:     Glucose-Capillary 229 (*)    All other components within normal limits  LIPASE, BLOOD  URINALYSIS, ROUTINE W REFLEX MICROSCOPIC (NOT AT [facility])  I-STAT TROPOININ, ED    Imaging Review No results found. I have personally reviewed and evaluated these images and lab results as part of my medical decision-making.   EKG Interpretation None      MDM   Final diagnoses:  None     Date: 08/16/2015  Rate:95  Rhythm: normal sinus rhythm  QRS Axis: normal  Intervals: normal  ST/T Wave abnormalities: normal  Conduction Disutrbances: none  Narrative Interpretation: unremarkable      Filed Vitals:   08/15/15 2003 08/15/15 2358  BP: 173/71 117/80  Pulse:  91  Temp:  98.6 F (37 C)  Resp:  18    Results for orders placed or performed during the hospital encounter of 08/15/15  Lipase, blood  Result Value Ref Range   Lipase 22 11 -  51 U/L  Comprehensive metabolic panel  Result Value Ref Range   Sodium 125 (L) 135 - 145 mmol/L   Potassium 4.3 3.5 - 5.1 mmol/L   Chloride 89 (L) 101 - 111 mmol/L   CO2 22 22 - 32 mmol/L   Glucose, Bld 242 (H) 65 - 99 mg/dL   BUN 22 (H) 6 - 20 mg/dL   Creatinine, Ser 1.05 0.61 - 1.24 mg/dL   Calcium 9.6 8.9 - 10.3 mg/dL   Total Protein 7.0 6.5 - 8.1 g/dL   Albumin 4.2 3.5 - 5.0 g/dL   AST 48 (H) 15 - 41 U/L   ALT 40 17 - 63 U/L   Alkaline Phosphatase 52 38 - 126 U/L   Total Bilirubin 1.2 0.3 - 1.2 mg/dL   GFR calc non Af Amer >60 >60 mL/min   GFR calc Af Amer >60 >60 mL/min   Anion gap 14 5 - 15  CBC  Result Value Ref Range   WBC 16.7 (H) 4.0 - 10.5 K/uL   RBC 4.36 4.22 - 5.81 MIL/uL   Hemoglobin 13.9 13.0 - 17.0 g/dL   HCT 39.4 39.0 - 52.0 %   MCV 90.4 78.0 - 100.0 fL   MCH 31.9 26.0 - 34.0 pg   MCHC 35.3 30.0 - 36.0 g/dL   RDW 12.0 11.5 - 15.5 %   Platelets 307 150 - 400 K/uL  CBG monitoring, ED  Result Value Ref Range   Glucose-Capillary 229 (H) 65 - 99 mg/dL  I-stat troponin, ED  Result Value Ref Range   Troponin i, poc  0.00 0.00 - 0.08 ng/mL   Comment 3            Results for orders placed or performed during the hospital encounter of 08/15/15  Lipase, blood  Result Value Ref Range   Lipase 22 11 - 51 U/L  Comprehensive metabolic panel  Result Value Ref Range   Sodium 125 (L) 135 - 145 mmol/L   Potassium 4.3 3.5 - 5.1 mmol/L   Chloride 89 (L) 101 - 111 mmol/L   CO2 22 22 - 32 mmol/L   Glucose, Bld 242 (H) 65 - 99 mg/dL   BUN 22 (H) 6 - 20 mg/dL   Creatinine, Ser 1.05 0.61 - 1.24 mg/dL   Calcium 9.6 8.9 - 10.3 mg/dL   Total Protein 7.0 6.5 - 8.1 g/dL   Albumin 4.2 3.5 - 5.0 g/dL   AST 48 (H) 15 - 41 U/L   ALT 40 17 - 63 U/L   Alkaline Phosphatase 52 38 - 126 U/L   Total Bilirubin 1.2 0.3 - 1.2 mg/dL   GFR calc non Af Amer >60 >60 mL/min   GFR calc Af Amer >60 >60 mL/min   Anion gap 14 5 - 15  CBC  Result Value Ref Range   WBC 16.7 (H) 4.0 - 10.5 K/uL   RBC 4.36 4.22 - 5.81 MIL/uL   Hemoglobin 13.9 13.0 - 17.0 g/dL   HCT 39.4 39.0 - 52.0 %   MCV 90.4 78.0 - 100.0 fL   MCH 31.9 26.0 - 34.0 pg   MCHC 35.3 30.0 - 36.0 g/dL   RDW 12.0 11.5 - 15.5 %   Platelets 307 150 - 400 K/uL  Urinalysis, Routine w reflex microscopic (not at [facility])  Result Value Ref Range   Color, Urine YELLOW YELLOW   APPearance CLEAR CLEAR   Specific Gravity, Urine 1.029 1.005 - 1.030   pH 5.5 5.0 - 8.0  Glucose, UA >1000 (A) NEGATIVE mg/dL   Hgb urine dipstick NEGATIVE NEGATIVE   Bilirubin Urine NEGATIVE NEGATIVE   Ketones, ur >80 (A) NEGATIVE mg/dL   Protein, ur NEGATIVE NEGATIVE mg/dL   Nitrite NEGATIVE NEGATIVE   Leukocytes, UA NEGATIVE NEGATIVE  Urine microscopic-add on  Result Value Ref Range   Squamous Epithelial / LPF 0-5 (A) NONE SEEN   WBC, UA NONE SEEN 0 - 5 WBC/hpf   RBC / HPF 0-5 0 - 5 RBC/hpf   Bacteria, UA RARE (A) NONE SEEN   Casts HYALINE CASTS (A) NEGATIVE  CBG monitoring, ED  Result Value Ref Range   Glucose-Capillary 229 (H) 65 - 99 mg/dL  I-stat troponin, ED  Result Value Ref Range    Troponin i, poc 0.00 0.00 - 0.08 ng/mL   Comment 3           Dg Neck Soft Tissue  08/16/2015  CLINICAL DATA:  Vomiting and hydrocephalus. EXAM: NECK SOFT TISSUES - 1+ VIEW COMPARISON:  None. FINDINGS: There is no evidence of retropharyngeal soft tissue swelling or epiglottic enlargement. The cervical airway is unremarkable and no radio-opaque foreign body identified. Ventriculoperitoneal shunt on the right with continuous tubing in the neck. The cranial portion is seen on head CT. IMPRESSION: Ventriculoperitoneal shunt with intact visible tubing. Electronically Signed   By: Monte Fantasia M.D.   On: 08/16/2015 02:25   Ct Head Wo Contrast  08/16/2015  CLINICAL DATA:  Vomiting and hydrocephalus EXAM: CT HEAD WITHOUT CONTRAST TECHNIQUE: Contiguous axial images were obtained from the base of the skull through the vertex without intravenous contrast. COMPARISON:  06/10/2013 FINDINGS: Skull and Sinuses:Ventriculoperitoneal shunt from a right posterior approach with tip near the midline after traversing the right lateral ventricle. The visualized tubing is intact and there is no fluid around the extracranial portion. No fracture or destructive process.  Visualized sinuses are clear Visualized orbits: Left enucleation with prosthesis. Right cataract resection Brain: No evidence of acute infarction, hemorrhage, hydrocephalus, or mass lesion/mass effect. Stable preferential drainage or coaptation of the right lateral ventricle. Stable volume of the normal sized other ventricles. Intracranial atherosclerotic calcification. IMPRESSION: 1. No acute finding or change from prior. 2. Ventriculoperitoneal shunt with no ventriculomegaly and stable ventricular volume. Electronically Signed   By: Monte Fantasia M.D.   On: 08/16/2015 02:28   Dg Abd Acute W/chest  08/16/2015  CLINICAL DATA:  Vomiting EXAM: DG ABDOMEN ACUTE W/ 1V CHEST COMPARISON:  05/24/2015 FINDINGS: Normal heart size and mediastinal contours. No acute  infiltrate or edema. No effusion or pneumothorax. No acute osseous findings. Large stool volume without obstruction or impaction. No evidence of pneumoperitoneum. Ventriculoperitoneal shunt catheter with tip in the right lower quadrant. Where seen there is no discontinuity. IMPRESSION: 1. Prominent stool retention without obstruction or rectal impaction. 2. Negative chest. Electronically Signed   By: Monte Fantasia M.D.   On: 08/16/2015 00:02   Filed Vitals:   08/16/15 0245 08/16/15 0300  BP: 143/68 142/60  Pulse: 88 85  Temp: 98.7 F (37.1 C)   Resp: 16    Medications  0.9 %  sodium chloride infusion ( Intravenous Stopped 08/16/15 0215)  ondansetron (ZOFRAN-ODT) disintegrating tablet 4 mg (4 mg Oral Given 08/15/15 2008)  sodium chloride 0.9 % bolus 1,000 mL (0 mLs Intravenous Stopped 08/15/15 2358)  ondansetron (ZOFRAN) injection 4 mg (4 mg Intravenous Given 08/15/15 2331)  ketorolac (TORADOL) 30 MG/ML injection 30 mg (30 mg Intravenous Given 08/15/15 2331)  ondansetron (ZOFRAN) injection 4 mg (  4 mg Intravenous Given 08/16/15 0242)  sodium chloride 0.9 % bolus 1,000 mL (1,000 mLs Intravenous New Bag/Given 08/16/15 0346)   PO challenged successfully in the ED No shunt malfunctions.  No signs of infection.  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  Is constipated.  Not on any meds for same.  Vomiting and myalgias are consistent with viral illness.  Patient is known to be constipated but states having BMs no signs of obstruction or obstipation.  Start Miralax BID x 7 days then daily.  Follow up with your PMD for recheck and your GI specialist.  Strict return precautions given  I personally performed the services described in this documentation, which was scribed in my presence. The recorded information has been reviewed and is accurate.     Veatrice Kells, MD 08/16/15 0443  Tiny Chaudhary, MD 08/16/15 [phone]

## 2015-08-15 NOTE — ED Notes (Addendum)
Pt is vomiting in triage.  Pt began feeling sick this am and vomiting began around 6pm.  No abdominal pain or fever with this.  Pt is reporting nausea, vomiting and dizziness as well as sensitivity to light, no HA and wife notes that he is a diabetic and last CBG was 154 around 6pm.

## 2015-08-16 ENCOUNTER — Emergency Department (HOSPITAL_COMMUNITY): Payer: BLUE CROSS/BLUE SHIELD

## 2015-08-16 ENCOUNTER — Encounter (HOSPITAL_COMMUNITY): Payer: Self-pay | Admitting: Emergency Medicine

## 2015-08-16 LAB — URINALYSIS, ROUTINE W REFLEX MICROSCOPIC
Bilirubin Urine: NEGATIVE
Glucose, UA: >1000 mg/dL — AB
Hgb urine dipstick: NEGATIVE
LEUKOCYTES UA: NEGATIVE
NITRITE: NEGATIVE
PROTEIN: NEGATIVE mg/dL
Specific Gravity, Urine: 1.029 (ref 1.005–1.030)
pH: 5.5 (ref 5.0–8.0)

## 2015-08-16 LAB — URINE MICROSCOPIC-ADD ON: WBC UA: NONE SEEN WBC/hpf (ref 0–5)

## 2015-08-16 MED ORDER — DICYCLOMINE HCL 10 MG/ML IM SOLN: 20.0000 mg | Freq: Once | INTRAMUSCULAR | Status: DC

## 2015-08-16 MED ORDER — POLYETHYLENE GLYCOL 3350 17 GM/SCOOP PO POWD: 17.0000 g | Freq: Every day | ORAL | Status: DC

## 2015-08-16 MED ORDER — ONDANSETRON HCL 4 MG/2ML IJ SOLN
4.0000 mg | Freq: Once | INTRAMUSCULAR | Status: AC
  Administered 2015-08-16: 4 mg via INTRAVENOUS
  Filled 2015-08-16: qty 2

## 2015-08-16 MED ORDER — ONDANSETRON 8 MG PO TBDP: ORAL_TABLET | ORAL | Status: DC

## 2015-08-16 MED ORDER — SODIUM CHLORIDE 0.9 % IV BOLUS (SEPSIS)
1000.0000 mL | Freq: Once | INTRAVENOUS | Status: AC
  Administered 2015-08-16: 1000 mL via INTRAVENOUS

## 2015-08-16 MED ORDER — KETOROLAC TROMETHAMINE 60 MG/2ML IM SOLN: 60.0000 mg | Freq: Once | INTRAMUSCULAR | Status: DC

## 2015-08-16 NOTE — ED Notes (Signed)
Pt given gingerale for PO challenge 

## 2015-08-16 NOTE — ED Notes (Signed)
Patient transported to X-ray 

## 2015-08-16 NOTE — Discharge Instructions (Signed)
Constipation, Adult Constipation is when a person:  Poops (has a bowel movement) less than 3 times a week.  Has a hard time pooping.  Has poop that is dry, hard, or bigger than normal. HOME CARE   Eat foods with a lot of fiber in them. This includes fruits, vegetables, beans, and whole grains such as brown rice.  Avoid fatty foods and foods with a lot of sugar. This includes french fries, hamburgers, cookies, candy, and soda.  If you are not getting enough fiber from food, take products with added fiber in them (supplements).  Drink enough fluid to keep your pee (urine) clear or pale yellow.  Exercise on a regular basis, or as told by your doctor.  Go to the restroom when you feel like you need to poop. Do not hold it.  Only take medicine as told by your doctor. Do not take medicines that help you poop (laxatives) without talking to your doctor first. GET HELP RIGHT AWAY IF:   You have bright red blood in your poop (stool).  Your constipation lasts more than 4 days or gets worse.  You have belly (abdominal) or butt (rectal) pain.  You have thin poop (as thin as a pencil).  You lose weight, and it cannot be explained. MAKE SURE YOU:   Understand these instructions.  Will watch your condition.  Will get help right away if you are not doing well or get worse.   This information is not intended to replace advice given to you by your health care provider. Make sure you discuss any questions you have with your health care provider.   Document Released: 09/23/2007 Document Revised: 04/27/2014 Document Reviewed: 01/16/2013 Elsevier Interactive Patient Education 2016 [employer]  

## 2015-10-03 ENCOUNTER — Other Ambulatory Visit (HOSPITAL_BASED_OUTPATIENT_CLINIC_OR_DEPARTMENT_OTHER): Payer: BLUE CROSS/BLUE SHIELD

## 2015-10-03 DIAGNOSIS — C9110 Chronic lymphocytic leukemia of B-cell type not having achieved remission: Secondary | ICD-10-CM | POA: Diagnosis not present

## 2015-10-03 LAB — CBC & DIFF AND RETIC
BASO%: 0.2 % (ref 0.0–2.0)
Basophils Absolute: 0 10*3/uL (ref 0.0–0.1)
EOS%: 0.8 % (ref 0.0–7.0)
Eosinophils Absolute: 0.1 10*3/uL (ref 0.0–0.5)
HEMATOCRIT: 36.8 % — AB (ref 38.4–49.9)
HGB: 12.9 g/dL — ABNORMAL LOW (ref 13.0–17.1)
Immature Retic Fract: 2.6 % — ABNORMAL LOW (ref 3.00–10.60)
LYMPH#: 9.2 10*3/uL — AB (ref 0.9–3.3)
LYMPH%: 54 % — AB (ref 14.0–49.0)
MCH: 32.7 pg (ref 27.2–33.4)
MCHC: 35.1 g/dL (ref 32.0–36.0)
MCV: 93.4 fL (ref 79.3–98.0)
MONO#: 0.8 10*3/uL (ref 0.1–0.9)
MONO%: 4.4 % (ref 0.0–14.0)
NEUT%: 40.6 % (ref 39.0–75.0)
NEUTROS ABS: 6.9 10*3/uL — AB (ref 1.5–6.5)
PLATELETS: 295 10*3/uL (ref 140–400)
RBC: 3.94 10*6/uL — AB (ref 4.20–5.82)
RDW: 12.7 % (ref 11.0–14.6)
RETIC %: 1.55 % (ref 0.80–1.80)
RETIC CT ABS: 61.07 10*3/uL (ref 34.80–93.90)
WBC: 17 10*3/uL — AB (ref 4.0–10.3)
nRBC: 0 % (ref 0–0)

## 2015-10-03 LAB — COMPREHENSIVE METABOLIC PANEL
ALT: 36 U/L (ref 0–55)
ANION GAP: 8 meq/L (ref 3–11)
AST: 49 U/L — ABNORMAL HIGH (ref 5–34)
Albumin: 3.9 g/dL (ref 3.5–5.0)
Alkaline Phosphatase: 53 U/L (ref 40–150)
BUN: 29.5 mg/dL — ABNORMAL HIGH (ref 7.0–26.0)
CALCIUM: 9.2 mg/dL (ref 8.4–10.4)
CHLORIDE: 99 meq/L (ref 98–109)
CO2: 27 meq/L (ref 22–29)
Creatinine: 1.3 mg/dL (ref 0.7–1.3)
EGFR: 58 mL/min/{1.73_m2} — ABNORMAL LOW (ref >90)
Glucose: 75 mg/dl (ref 70–140)
POTASSIUM: 4.4 meq/L (ref 3.5–5.1)
Sodium: 134 mEq/L — ABNORMAL LOW (ref 136–145)
Total Bilirubin: 0.35 mg/dL (ref 0.20–1.20)
Total Protein: 6.6 g/dL (ref 6.4–8.3)

## 2015-10-03 LAB — LACTATE DEHYDROGENASE: LDH: 200 U/L (ref 125–245)

## 2015-10-14 ENCOUNTER — Telehealth: Payer: Self-pay | Admitting: Hematology

## 2015-10-14 ENCOUNTER — Ambulatory Visit (HOSPITAL_BASED_OUTPATIENT_CLINIC_OR_DEPARTMENT_OTHER): Payer: BLUE CROSS/BLUE SHIELD | Admitting: Hematology

## 2015-10-14 ENCOUNTER — Encounter: Payer: Self-pay | Admitting: Hematology

## 2015-10-14 VITALS — BP 157/62 | HR 68 | Temp 98.4°F | Resp 18 | Ht 70.0 in | Wt 194.6 lb

## 2015-10-14 DIAGNOSIS — C9110 Chronic lymphocytic leukemia of B-cell type not having achieved remission: Secondary | ICD-10-CM | POA: Diagnosis not present

## 2015-10-14 DIAGNOSIS — D72820 Lymphocytosis (symptomatic): Secondary | ICD-10-CM | POA: Diagnosis not present

## 2015-10-14 DIAGNOSIS — K5900 Constipation, unspecified: Secondary | ICD-10-CM

## 2015-10-14 NOTE — Telephone Encounter (Signed)
Gave and printed appt sched and avs fo rpt for OCT °

## 2015-10-14 NOTE — Progress Notes (Signed)
[name]    HEMATOLOGY/ONCOLOGY CLINIC NOTE  Date of Service: .10/14/2015    Patient Care Team: Thressa Sheller, MD as PCP - General (Internal Medicine)  CHIEF COMPLAINTS: f/u for lymphocytosis  HISTORY OF PRESENTING ILLNESS: plz see initial consultation for details on initial presentation  INTERVAL HISTORY  Christopher. Christopher Conner is here for his scheduled followup.  He notes no other acute new symptoms. Has been feeling well. We'll night sweats. No fevers or chills. Has gained about 6 pounds. No new enlarged lymph nodes. CBC is stable today. He had some questions after reading some educational material online enhancing radius which helped him answer completely.   MEDICAL HISTORY:  Past Medical History  Diagnosis Date  . Diabetes mellitus without complication ([place])   . Hypertension   . High cholesterol   . Normal pressure hydrocephalus   . Hyponatremia   . GERD (gastroesophageal reflux disease)   . Chronic lymphocytic leukemia ([place])     SURGICAL HISTORY: Past Surgical History  Procedure Laterality Date  . Ventriculoperitoneal shunt    . Back surgery    . Lumbar fusion    . Appendectomy    . Cyst removal neck    . Cataract extraction      left eye    SOCIAL HISTORY: Social History   Social History  . Marital Status: Married    Spouse Name: [name]  . Number of Children: [name]  . Years of Education: [name]   Occupational History  . systems Counsellor (employer)   Social History Main Topics  . Smoking status: Never Smoker   . Smokeless tobacco: Not on file  . Alcohol Use: No  . Drug Use: No  . Sexual Activity: Not on file   Other Topics Concern  . Not on file   Social History Narrative    FAMILY HISTORY: Family History  Problem Relation Age of Onset  . Hypertension Mother   . Hyperlipidemia Mother   . Diabetes Mother   . Diabetes Father     ALLERGIES:  has No Known Allergies.  MEDICATIONS:  Current Outpatient Prescriptions  Medication  Sig Dispense Refill  . amLODipine (NORVASC) 10 MG tablet Take 10 mg by mouth every evening.     [name] aspirin 81 MG tablet Take 81 mg by mouth daily.    . insulin lispro (HUMALOG) 100 UNIT/ML injection Inject into the skin 3 (three) times daily before meals. Cont pump    . lisinopril-hydrochlorothiazide (PRINZIDE,ZESTORETIC) 20-12.5 MG tablet Take 2 tablets by mouth daily.    . ondansetron (ZOFRAN ODT) 8 MG disintegrating tablet 8mg  ODT q6 hours prn nausea 8 tablet 0  . polyethylene glycol powder (MIRALAX) powder Take 17 g by mouth daily. 255 g 0  . Polyvinyl Alcohol-Povidone (REFRESH OP) Apply 1-2 drops to eye every 3 (three) hours as needed (dryness).    . pravastatin (PRAVACHOL) 40 MG tablet Take 40 mg by mouth every evening.      No current facility-administered medications for this visit.    REVIEW OF SYSTEMS:    10 Point review of Systems was done is negative except as noted above.  PHYSICAL EXAMINATION: ECOG PERFORMANCE STATUS: 1 - Symptomatic but completely ambulatory  . Filed Vitals:   10/14/15 1302  BP: 157/62  Pulse: 68  Temp: 98.4 F (36.9 C)  Resp: 18   Filed Weights   10/14/15 1302  Weight: 194 lb 9.6 oz (88.27 kg)   .Body mass index is 27.92 kg/(m^2).  GENERAL:alert,  in no acute distress and comfortable SKIN: skin color, texture, turgor are normal, no rashes or significant lesions EYES: normal, conjunctiva are pink and non-injected, sclera clear OROPHARYNX:no exudate, no erythema and lips, buccal mucosa, and tongue normal  NECK: supple, no JVD, thyroid normal size, non-tender, without nodularity LYMPH:  no palpable lymphadenopathy in the cervical, axillary or inguinal LUNGS: clear to auscultation with normal respiratory effort HEART: regular rate & rhythm,  no murmurs and no lower extremity edema ABDOMEN: abdomen soft, non-tender, normoactive bowel sounds ,no palpable hepatosplenomegaly Musculoskeletal: no cyanosis of digits and no clubbing  PSYCH: alert &  oriented x 3 with fluent speech NEURO: no focal motor/sensory deficits  LABORATORY DATA:  I have reviewed the data as listed  . CBC Latest Ref Rng 10/03/2015 08/15/2015 05/27/2015  WBC 4.0 - 10.3 10e3/uL 17.0(H) 16.7(H) 15.0(H)  Hemoglobin 13.0 - 17.1 g/dL 12.9(L) 13.9 13.3  Hematocrit 38.4 - 49.9 % 36.8(L) 39.4 38.1(L)  Platelets 140 - 400 10e3/uL 295 307 294   . CBC    Component Value Date/Time   WBC 17.0* 10/03/2015 1248   WBC 16.7* 08/15/2015 2019   RBC 3.94* 10/03/2015 1248   RBC 4.36 08/15/2015 2019   HGB 12.9* 10/03/2015 1248   HGB 13.9 08/15/2015 2019   HCT 36.8* 10/03/2015 1248   HCT 39.4 08/15/2015 2019   PLT 295 10/03/2015 1248   PLT 307 08/15/2015 2019   MCV 93.4 10/03/2015 1248   MCV 90.4 08/15/2015 2019   MCH 32.7 10/03/2015 1248   MCH 31.9 08/15/2015 2019   MCHC 35.1 10/03/2015 1248   MCHC 35.3 08/15/2015 2019   RDW 12.7 10/03/2015 1248   RDW 12.0 08/15/2015 2019   LYMPHSABS 9.2* 10/03/2015 1248   LYMPHSABS 5.1* 04/09/2014 1624   MONOABS 0.8 10/03/2015 1248   MONOABS 0.7 04/09/2014 1624   EOSABS 0.1 10/03/2015 1248   EOSABS 0.1 04/09/2014 1624   BASOSABS 0.0 10/03/2015 1248   BASOSABS 0.0 04/09/2014 1624     CMP Latest Ref Rng 10/03/2015 08/15/2015 05/27/2015  Glucose 70 - 140 mg/dl 75 242(H) 122  BUN 7.0 - 26.0 mg/dL 29.5(H) 22(H) 17.8  Creatinine 0.7 - 1.3 mg/dL 1.3 1.05 0.9  Sodium 136 - 145 mEq/L 134(L) 125(L) 134(L)  Potassium 3.5 - 5.1 mEq/L 4.4 4.3 4.2  Chloride 101 - 111 mmol/L - 89(L) -  CO2 22 - 29 mEq/L 27 22 26   Calcium 8.4 - 10.4 mg/dL 9.2 9.6 9.2  Total Protein 6.4 - 8.3 g/dL 6.6 7.0 7.1  Total Bilirubin 0.20 - 1.20 mg/dL 0.35 1.2 0.36  Alkaline Phos 40 - 150 U/L 53 52 53  AST 5 - 34 U/L 49(H) 48(H) 33  ALT 0 - 55 U/L 36 40 31  . Lab Results  Component Value Date   LDH 200 10/03/2015         RADIOGRAPHIC STUDIES: I have personally reviewed the radiological images as listed and agreed with the findings in the report.  CT  abdomen pelvis 06/13/2015  IMPRESSION: 1. Moderate fecal burden throughout the colon, compatible with the reported clinical history of constipation. No obstructing lesion identified. 2. Two poorly defined hypervascular liver lesions, as discussed above. 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 Further characterization with nonemergent MRI of the abdomen with and without IV gadolinium is recommended in the near future. 3. No lymphadenopathy noted in the abdomen or pelvis. 4. No acute findings in the abdomen or pelvis. 5.  Extensive atherosclerosis. 6. Additional incidental findings, as above.   Electronically Signed  By: Vinnie Langton M.D.  On: 06/13/2015 13:18 ASSESSMENT & PLAN:   Christopher Conner is a very pleasant 65 year old Caucasian gentleman with  1) Rai Stage 0 CLL with lymphocytosis with no overt LNadenopathy or palpable hepatosplenomegaly. On presentation Absolute lymphocyte count of 8.8k with about 79 percent clonal lymphocytes on flow cytometry consistent with CLL. nl Hgb and platelets.  Today on labs the patient's CBC is stable. Absolute lymphocyte count is slightly increased to 9.2k  No constitutional symptoms. No new lymphadenopathy. No significant anemia or thrombocytopenia. Plan -Lab results discussed with the patient. -No indication for treatment of his CLL at this time -peripheral blood CLL FISH prognostic panel sent out today   2)Constipation. Being workup by his PCP. On laxatives and Linzess to address this. Patient notes that he has been working with Dr. Collene Mares on his laxative regimen and that his constipation is much improved . He had a CT of the abdomen and pelvis on 06/13/2015  which showed no evidence of obstructing lesion. Moderate fecal burden throughout the colon. No lymphadenopathy in the abdomen or pelvis.  Plan -Patient reports that he has been scheduled for  a colonoscopy .  RTC with Dr Irene Limbo in 4 months with cbc, cmp. Earlier if any new concerns or concerning findings on colonoscopy/abdominal imaging as part of workup for constipation.   . Orders Placed This Encounter  Procedures  . CBC & Diff and Retic    Standing Status: Future     Number of Occurrences:      Standing Expiration Date: 10/13/2016  . Comprehensive metabolic panel    Standing Status: Future     Number of Occurrences:      Standing Expiration Date: 10/13/2016     All of the patients questions were answered to his apparent satisfaction. The patient knows to call the clinic with any problems, questions or concerns.  I spent 20 minutes counseling the patient face to face. The total time spent in the appointment was 25 minutes and more than 50% was on counseling and direct patient cares.    Sullivan Lone MD [place] AAHIVMS [facility] Hematology/Oncology Physician [facility]  (Office):       [phone] (Work cell):  [phone] (Fax):           [phone]

## 2015-10-15 ENCOUNTER — Other Ambulatory Visit: Payer: Self-pay | Admitting: *Deleted

## 2015-11-13 LAB — FISH,CLL PROGNOSTIC PANEL

## 2016-01-21 DIAGNOSIS — E1021 Type 1 diabetes mellitus with diabetic nephropathy: Secondary | ICD-10-CM | POA: Diagnosis not present

## 2016-01-21 DIAGNOSIS — Z23 Encounter for immunization: Secondary | ICD-10-CM | POA: Diagnosis not present

## 2016-01-21 DIAGNOSIS — I129 Hypertensive chronic kidney disease with stage 1 through stage 4 chronic kidney disease, or unspecified chronic kidney disease: Secondary | ICD-10-CM | POA: Diagnosis not present

## 2016-01-21 DIAGNOSIS — E785 Hyperlipidemia, unspecified: Secondary | ICD-10-CM | POA: Diagnosis not present

## 2016-02-03 ENCOUNTER — Telehealth: Payer: Self-pay | Admitting: Medical Oncology

## 2016-02-03 NOTE — Telephone Encounter (Signed)
Confirmed appts . 

## 2016-02-07 ENCOUNTER — Other Ambulatory Visit (HOSPITAL_BASED_OUTPATIENT_CLINIC_OR_DEPARTMENT_OTHER): Payer: Medicare Other

## 2016-02-07 DIAGNOSIS — C9110 Chronic lymphocytic leukemia of B-cell type not having achieved remission: Secondary | ICD-10-CM | POA: Diagnosis not present

## 2016-02-07 LAB — CBC & DIFF AND RETIC
BASO%: 0.2 % (ref 0.0–2.0)
BASOS ABS: 0 10*3/uL (ref 0.0–0.1)
EOS ABS: 0.2 10*3/uL (ref 0.0–0.5)
EOS%: 1 % (ref 0.0–7.0)
HEMATOCRIT: 37.4 % — AB (ref 38.4–49.9)
HEMOGLOBIN: 13 g/dL (ref 13.0–17.1)
Immature Retic Fract: 1.4 % — ABNORMAL LOW (ref 3.00–10.60)
LYMPH%: 59.7 % — AB (ref 14.0–49.0)
MCH: 32.2 pg (ref 27.2–33.4)
MCHC: 34.8 g/dL (ref 32.0–36.0)
MCV: 92.6 fL (ref 79.3–98.0)
MONO#: 0.9 10*3/uL (ref 0.1–0.9)
MONO%: 4.4 % (ref 0.0–14.0)
NEUT#: 6.9 10*3/uL — ABNORMAL HIGH (ref 1.5–6.5)
NEUT%: 34.7 % — AB (ref 39.0–75.0)
PLATELETS: 288 10*3/uL (ref 140–400)
RBC: 4.04 10*6/uL — ABNORMAL LOW (ref 4.20–5.82)
RDW: 12.6 % (ref 11.0–14.6)
Retic %: 1.44 % (ref 0.80–1.80)
Retic Ct Abs: 58.18 10*3/uL (ref 34.80–93.90)
WBC: 19.7 10*3/uL — ABNORMAL HIGH (ref 4.0–10.3)
lymph#: 11.8 10*3/uL — ABNORMAL HIGH (ref 0.9–3.3)

## 2016-02-07 LAB — COMPREHENSIVE METABOLIC PANEL
ALBUMIN: 4 g/dL (ref 3.5–5.0)
ALK PHOS: 63 U/L (ref 40–150)
ALT: 41 U/L (ref 0–55)
AST: 49 U/L — AB (ref 5–34)
Anion Gap: 10 mEq/L (ref 3–11)
BUN: 28.3 mg/dL — AB (ref 7.0–26.0)
CALCIUM: 9.5 mg/dL (ref 8.4–10.4)
CO2: 25 mEq/L (ref 22–29)
Chloride: 99 mEq/L (ref 98–109)
Creatinine: 1.3 mg/dL (ref 0.7–1.3)
EGFR: 57 mL/min/{1.73_m2} — AB (ref >90)
Glucose: 97 mg/dl (ref 70–140)
POTASSIUM: 4.4 meq/L (ref 3.5–5.1)
Sodium: 134 mEq/L — ABNORMAL LOW (ref 136–145)
Total Bilirubin: 0.35 mg/dL (ref 0.20–1.20)
Total Protein: 6.9 g/dL (ref 6.4–8.3)

## 2016-02-10 ENCOUNTER — Encounter: Payer: Self-pay | Admitting: Hematology

## 2016-02-10 ENCOUNTER — Telehealth: Payer: Self-pay | Admitting: Hematology

## 2016-02-10 ENCOUNTER — Ambulatory Visit: Payer: BLUE CROSS/BLUE SHIELD | Admitting: Hematology

## 2016-02-10 ENCOUNTER — Other Ambulatory Visit: Payer: BLUE CROSS/BLUE SHIELD

## 2016-02-10 ENCOUNTER — Ambulatory Visit (HOSPITAL_BASED_OUTPATIENT_CLINIC_OR_DEPARTMENT_OTHER): Payer: Medicare Other | Admitting: Hematology

## 2016-02-10 VITALS — BP 132/52 | HR 66 | Temp 97.7°F | Resp 18 | Ht 70.0 in | Wt 192.9 lb

## 2016-02-10 DIAGNOSIS — K5900 Constipation, unspecified: Secondary | ICD-10-CM

## 2016-02-10 DIAGNOSIS — D72820 Lymphocytosis (symptomatic): Secondary | ICD-10-CM | POA: Diagnosis not present

## 2016-02-10 DIAGNOSIS — C9110 Chronic lymphocytic leukemia of B-cell type not having achieved remission: Secondary | ICD-10-CM

## 2016-02-10 NOTE — Telephone Encounter (Signed)
AVS report and appointment schedule given to patient, per 02/10/16 los.

## 2016-02-24 DIAGNOSIS — I129 Hypertensive chronic kidney disease with stage 1 through stage 4 chronic kidney disease, or unspecified chronic kidney disease: Secondary | ICD-10-CM | POA: Diagnosis not present

## 2016-04-17 NOTE — Progress Notes (Signed)
[name]    HEMATOLOGY/ONCOLOGY CLINIC NOTE  Date of Service: .02/10/2016  Patient Care Team: Thressa Sheller, MD as PCP - General (Internal Medicine)  CHIEF COMPLAINTS: f/u for CLL  HISTORY OF PRESENTING ILLNESS: plz see initial consultation for details on initial presentation  INTERVAL HISTORY  Christopher Conner is here for his scheduled followup.  He notes no other acute new symptoms. Has been feeling well. No reported night sweats. No fevers or chills. No new enlarged lymph nodes. CBC is stable today.    MEDICAL HISTORY:  Past Medical History:  Diagnosis Date  . Chronic lymphocytic leukemia ([place])   . Diabetes mellitus without complication ([place])   . GERD (gastroesophageal reflux disease)   . High cholesterol   . Hypertension   . Hyponatremia   . Normal pressure hydrocephalus     SURGICAL HISTORY: Past Surgical History:  Procedure Laterality Date  . APPENDECTOMY    . BACK SURGERY    . CATARACT EXTRACTION     left eye  . CYST REMOVAL NECK    . LUMBAR FUSION    . VENTRICULOPERITONEAL SHUNT      SOCIAL HISTORY: Social History   Social History  . Marital status: Married    Spouse name: [name]  . Number of children: [name]  . Years of education: [name]   Occupational History  . systems Counsellor (employer)   Social History Main Topics  . Smoking status: Never Smoker  . Smokeless tobacco: Not on file  . Alcohol use No  . Drug use: No  . Sexual activity: Not on file   Other Topics Concern  . Not on file   Social History Narrative  . No narrative on file    FAMILY HISTORY: Family History  Problem Relation Age of Onset  . Hypertension Mother   . Hyperlipidemia Mother   . Diabetes Mother   . Diabetes Father     ALLERGIES:  has No Known Allergies.  MEDICATIONS:  Current Outpatient Prescriptions  Medication Sig Dispense Refill  . Insulin Aspart (NOVOLOG [place]) Inject into the skin.    [name] aspirin 81 MG tablet Take 81 mg by mouth daily.    [name]  lisinopril-hydrochlorothiazide (PRINZIDE,ZESTORETIC) 20-12.5 MG tablet Take 2 tablets by mouth daily.    . ondansetron (ZOFRAN ODT) 8 MG disintegrating tablet 8mg  ODT q6 hours prn nausea 8 tablet 0  . polyethylene glycol powder (MIRALAX) powder Take 17 g by mouth daily. 255 g 0  . Polyvinyl Alcohol-Povidone (REFRESH OP) Apply 1-2 drops to eye every 3 (three) hours as needed (dryness).    . pravastatin (PRAVACHOL) 40 MG tablet Take 40 mg by mouth every evening.      No current facility-administered medications for this visit.     REVIEW OF SYSTEMS:    10 Point review of Systems was done is negative except as noted above.  PHYSICAL EXAMINATION: ECOG PERFORMANCE STATUS: 1 - Symptomatic but completely ambulatory  . Vitals:   02/10/16 1335  BP: (!) 132/52  Pulse: 66  Resp: 18  Temp: 97.7 F (36.5 C)   Filed Weights   02/10/16 1335  Weight: 192 lb 14.4 oz (87.5 kg)   .Body mass index is 27.68 kg/m.  GENERAL:alert, in no acute distress and comfortable SKIN: skin color, texture, turgor are normal, no rashes or significant lesions EYES: normal, conjunctiva are pink and non-injected, sclera clear OROPHARYNX:no exudate, no erythema and lips, buccal mucosa, and tongue normal  NECK: supple, no JVD, thyroid  normal size, non-tender, without nodularity LYMPH:  no palpable lymphadenopathy in the cervical, axillary or inguinal LUNGS: clear to auscultation with normal respiratory effort HEART: regular rate & rhythm,  no murmurs and no lower extremity edema ABDOMEN: abdomen soft, non-tender, normoactive bowel sounds ,no palpable hepatosplenomegaly Musculoskeletal: no cyanosis of digits and no clubbing  PSYCH: alert & oriented x 3 with fluent speech NEURO: no focal motor/sensory deficits  LABORATORY DATA:  I have reviewed the data as listed  . CBC Latest Ref Rng & Units 02/07/2016 10/03/2015 08/15/2015  WBC 4.0 - 10.3 10e3/uL 19.7(H) 17.0(H) 16.7(H)  Hemoglobin 13.0 - 17.1 g/dL 13.0  12.9(L) 13.9  Hematocrit 38.4 - 49.9 % 37.4(L) 36.8(L) 39.4  Platelets 140 - 400 10e3/uL 288 295 307   . CBC    Component Value Date/Time   WBC 19.7 (H) 02/07/2016 1324   WBC 16.7 (H) 08/15/2015 2019   RBC 4.04 (L) 02/07/2016 1324   RBC 4.36 08/15/2015 2019   HGB 13.0 02/07/2016 1324   HCT 37.4 (L) 02/07/2016 1324   PLT 288 02/07/2016 1324   MCV 92.6 02/07/2016 1324   MCH 32.2 02/07/2016 1324   MCH 31.9 08/15/2015 2019   MCHC 34.8 02/07/2016 1324   MCHC 35.3 08/15/2015 2019   RDW 12.6 02/07/2016 1324   LYMPHSABS 11.8 (H) 02/07/2016 1324   MONOABS 0.9 02/07/2016 1324   EOSABS 0.2 02/07/2016 1324   BASOSABS 0.0 02/07/2016 1324     CMP Latest Ref Rng & Units 02/07/2016 10/03/2015 08/15/2015  Glucose 70 - 140 mg/dl 97 75 242(H)  BUN 7.0 - 26.0 mg/dL 28.3(H) 29.5(H) 22(H)  Creatinine 0.7 - 1.3 mg/dL 1.3 1.3 1.05  Sodium 136 - 145 mEq/L 134(L) 134(L) 125(L)  Potassium 3.5 - 5.1 mEq/L 4.4 4.4 4.3  Chloride 101 - 111 mmol/L - - 89(L)  CO2 22 - 29 mEq/L 25 27 22   Calcium 8.4 - 10.4 mg/dL 9.5 9.2 9.6  Total Protein 6.4 - 8.3 g/dL 6.9 6.6 7.0  Total Bilirubin 0.20 - 1.20 mg/dL 0.35 0.35 1.2  Alkaline Phos 40 - 150 U/L 63 53 52  AST 5 - 34 U/L 49(H) 49(H) 48(H)  ALT 0 - 55 U/L 41 36 40  . Lab Results  Component Value Date   LDH 200 10/03/2015         ASSESSMENT & PLAN:   Christopher Conner is a very pleasant 65 year old Caucasian gentleman with  1) Rai Stage 0 CLL with lymphocytosis with no overt LNadenopathy or palpable hepatosplenomegaly. On presentation Absolute lymphocyte count of 8.8k with about 79 percent clonal lymphocytes on flow cytometry consistent with CLL. nl Hgb and platelets.  Today on labs the patient's CBC is stable with hgb of 13, plt 288k and WBC count of 19.7k with Absolute lymphocyte count is slightly increased to 11.8k (from 9.2k)  CLL prognostic FISH panel neg for commonly tested mutations. No constitutional symptoms. No new lymphadenopathy. Plan -Lab  results discussed with the patient. -No indication for treatment of his CLL at this time -Return in about 6 months (around 08/10/2016) for labs, appointment with Dr Irene Limbo  2)Constipation. Being workup by his PCP. On laxatives and Linzess to address this. Patient notes that he has been working with Dr. Collene Mares on his laxative regimen and that his constipation is much improved . He had a CT of the abdomen and pelvis on 06/13/2015  which showed no evidence of obstructing lesion. Moderate fecal burden throughout the colon. No lymphadenopathy in the abdomen or pelvis.  Plan -Patient reports that he has been scheduled for a colonoscopy .  RTC with Dr Irene Limbo in 6 months with cbc, cmp. Earlier if any new concerns . [name] Orders Placed This Encounter  Procedures  . CBC & Diff and Retic    Standing Status:   Future    Standing Expiration Date:   02/09/2017  . Comprehensive metabolic panel    Standing Status:   Future    Standing Expiration Date:   02/09/2017  . Lactate dehydrogenase    Standing Status:   Future    Standing Expiration Date:   02/09/2017     All of the patients questions were answered to his apparent satisfaction. The patient knows to call the clinic with any problems, questions or concerns.  I spent 20 minutes counseling the patient face to face. The total time spent in the appointment was 20 minutes and more than 50% was on counseling and direct patient cares.    Sullivan Lone MD [place] AAHIVMS [facility] Hematology/Oncology Physician [facility]  (Office):       [phone] (Work cell):  [phone] (Fax):           [phone]

## 2016-05-18 DIAGNOSIS — M25511 Pain in right shoulder: Secondary | ICD-10-CM | POA: Diagnosis not present

## 2016-05-25 DIAGNOSIS — H44522 Atrophy of globe, left eye: Secondary | ICD-10-CM | POA: Diagnosis not present

## 2016-05-25 DIAGNOSIS — E103593 Type 1 diabetes mellitus with proliferative diabetic retinopathy without macular edema, bilateral: Secondary | ICD-10-CM | POA: Diagnosis not present

## 2016-05-28 DIAGNOSIS — M25511 Pain in right shoulder: Secondary | ICD-10-CM | POA: Diagnosis not present

## 2016-06-01 DIAGNOSIS — M25511 Pain in right shoulder: Secondary | ICD-10-CM | POA: Diagnosis not present

## 2016-06-04 DIAGNOSIS — E785 Hyperlipidemia, unspecified: Secondary | ICD-10-CM | POA: Diagnosis not present

## 2016-06-04 DIAGNOSIS — E1021 Type 1 diabetes mellitus with diabetic nephropathy: Secondary | ICD-10-CM | POA: Diagnosis not present

## 2016-06-04 DIAGNOSIS — I129 Hypertensive chronic kidney disease with stage 1 through stage 4 chronic kidney disease, or unspecified chronic kidney disease: Secondary | ICD-10-CM | POA: Diagnosis not present

## 2016-06-11 DIAGNOSIS — C9190 Lymphoid leukemia, unspecified not having achieved remission: Secondary | ICD-10-CM | POA: Diagnosis not present

## 2016-06-11 DIAGNOSIS — E109 Type 1 diabetes mellitus without complications: Secondary | ICD-10-CM | POA: Diagnosis not present

## 2016-06-11 DIAGNOSIS — I10 Essential (primary) hypertension: Secondary | ICD-10-CM | POA: Diagnosis not present

## 2016-06-11 DIAGNOSIS — E7800 Pure hypercholesterolemia, unspecified: Secondary | ICD-10-CM | POA: Diagnosis not present

## 2016-06-16 DIAGNOSIS — Z1211 Encounter for screening for malignant neoplasm of colon: Secondary | ICD-10-CM | POA: Diagnosis not present

## 2016-06-16 DIAGNOSIS — K5901 Slow transit constipation: Secondary | ICD-10-CM | POA: Diagnosis not present

## 2016-06-18 DIAGNOSIS — M24111 Other articular cartilage disorders, right shoulder: Secondary | ICD-10-CM | POA: Diagnosis not present

## 2016-06-18 DIAGNOSIS — M958 Other specified acquired deformities of musculoskeletal system: Secondary | ICD-10-CM | POA: Diagnosis not present

## 2016-06-18 DIAGNOSIS — M75111 Incomplete rotator cuff tear or rupture of right shoulder, not specified as traumatic: Secondary | ICD-10-CM | POA: Diagnosis not present

## 2016-06-18 DIAGNOSIS — M7541 Impingement syndrome of right shoulder: Secondary | ICD-10-CM | POA: Diagnosis not present

## 2016-06-18 DIAGNOSIS — M19011 Primary osteoarthritis, right shoulder: Secondary | ICD-10-CM | POA: Diagnosis not present

## 2016-07-03 DIAGNOSIS — M25511 Pain in right shoulder: Secondary | ICD-10-CM | POA: Diagnosis not present

## 2016-07-03 DIAGNOSIS — M24111 Other articular cartilage disorders, right shoulder: Secondary | ICD-10-CM | POA: Diagnosis not present

## 2016-07-13 DIAGNOSIS — D120 Benign neoplasm of cecum: Secondary | ICD-10-CM | POA: Diagnosis not present

## 2016-07-13 DIAGNOSIS — K5900 Constipation, unspecified: Secondary | ICD-10-CM | POA: Diagnosis not present

## 2016-07-13 DIAGNOSIS — Z1211 Encounter for screening for malignant neoplasm of colon: Secondary | ICD-10-CM | POA: Diagnosis not present

## 2016-07-13 DIAGNOSIS — K6389 Other specified diseases of intestine: Secondary | ICD-10-CM | POA: Diagnosis not present

## 2016-07-13 DIAGNOSIS — K635 Polyp of colon: Secondary | ICD-10-CM | POA: Diagnosis not present

## 2016-07-27 DIAGNOSIS — I129 Hypertensive chronic kidney disease with stage 1 through stage 4 chronic kidney disease, or unspecified chronic kidney disease: Secondary | ICD-10-CM | POA: Diagnosis not present

## 2016-07-27 DIAGNOSIS — Z9641 Presence of insulin pump (external) (internal): Secondary | ICD-10-CM | POA: Diagnosis not present

## 2016-07-27 DIAGNOSIS — E1021 Type 1 diabetes mellitus with diabetic nephropathy: Secondary | ICD-10-CM | POA: Diagnosis not present

## 2016-07-27 DIAGNOSIS — E785 Hyperlipidemia, unspecified: Secondary | ICD-10-CM | POA: Diagnosis not present

## 2016-08-03 DIAGNOSIS — M25511 Pain in right shoulder: Secondary | ICD-10-CM | POA: Diagnosis not present

## 2016-08-03 DIAGNOSIS — M24111 Other articular cartilage disorders, right shoulder: Secondary | ICD-10-CM | POA: Diagnosis not present

## 2016-08-04 DIAGNOSIS — M25511 Pain in right shoulder: Secondary | ICD-10-CM | POA: Diagnosis not present

## 2016-08-04 DIAGNOSIS — M6281 Muscle weakness (generalized): Secondary | ICD-10-CM | POA: Diagnosis not present

## 2016-08-04 DIAGNOSIS — M25611 Stiffness of right shoulder, not elsewhere classified: Secondary | ICD-10-CM | POA: Diagnosis not present

## 2016-08-10 ENCOUNTER — Other Ambulatory Visit (HOSPITAL_BASED_OUTPATIENT_CLINIC_OR_DEPARTMENT_OTHER): Payer: Medicare Other

## 2016-08-10 ENCOUNTER — Encounter: Payer: Self-pay | Admitting: Hematology

## 2016-08-10 ENCOUNTER — Ambulatory Visit (HOSPITAL_BASED_OUTPATIENT_CLINIC_OR_DEPARTMENT_OTHER): Payer: Medicare Other | Admitting: Hematology

## 2016-08-10 ENCOUNTER — Telehealth: Payer: Self-pay | Admitting: Hematology

## 2016-08-10 VITALS — BP 133/57 | HR 66 | Temp 97.7°F | Resp 16 | Wt 188.1 lb

## 2016-08-10 DIAGNOSIS — D72820 Lymphocytosis (symptomatic): Secondary | ICD-10-CM

## 2016-08-10 DIAGNOSIS — C9110 Chronic lymphocytic leukemia of B-cell type not having achieved remission: Secondary | ICD-10-CM | POA: Diagnosis not present

## 2016-08-10 DIAGNOSIS — K5900 Constipation, unspecified: Secondary | ICD-10-CM

## 2016-08-10 LAB — CBC & DIFF AND RETIC
BASO%: 0.2 % (ref 0.0–2.0)
Basophils Absolute: 0 10*3/uL (ref 0.0–0.1)
EOS%: 1.2 % (ref 0.0–7.0)
Eosinophils Absolute: 0.2 10*3/uL (ref 0.0–0.5)
HCT: 40.5 % (ref 38.4–49.9)
HGB: 13.8 g/dL (ref 13.0–17.1)
IMMATURE RETIC FRACT: 1.5 % — AB (ref 3.00–10.60)
LYMPH#: 11.1 10*3/uL — AB (ref 0.9–3.3)
LYMPH%: 68.4 % — ABNORMAL HIGH (ref 14.0–49.0)
MCH: 32.5 pg (ref 27.2–33.4)
MCHC: 34.1 g/dL (ref 32.0–36.0)
MCV: 95.3 fL (ref 79.3–98.0)
MONO#: 0.6 10*3/uL (ref 0.1–0.9)
MONO%: 3.8 % (ref 0.0–14.0)
NEUT%: 26.4 % — ABNORMAL LOW (ref 39.0–75.0)
NEUTROS ABS: 4.3 10*3/uL (ref 1.5–6.5)
PLATELETS: 273 10*3/uL (ref 140–400)
RBC: 4.25 10*6/uL (ref 4.20–5.82)
RDW: 13 % (ref 11.0–14.6)
RETIC CT ABS: 53.13 10*3/uL (ref 34.80–93.90)
Retic %: 1.25 % (ref 0.80–1.80)
WBC: 16.2 10*3/uL — ABNORMAL HIGH (ref 4.0–10.3)

## 2016-08-10 LAB — COMPREHENSIVE METABOLIC PANEL
ALT: 33 U/L (ref 0–55)
ANION GAP: 8 meq/L (ref 3–11)
AST: 48 U/L — ABNORMAL HIGH (ref 5–34)
Albumin: 4.2 g/dL (ref 3.5–5.0)
Alkaline Phosphatase: 56 U/L (ref 40–150)
BILIRUBIN TOTAL: 0.38 mg/dL (ref 0.20–1.20)
BUN: 29.2 mg/dL — ABNORMAL HIGH (ref 7.0–26.0)
CHLORIDE: 104 meq/L (ref 98–109)
CO2: 30 mEq/L — ABNORMAL HIGH (ref 22–29)
CREATININE: 1.1 mg/dL (ref 0.7–1.3)
Calcium: 9.6 mg/dL (ref 8.4–10.4)
EGFR: 71 mL/min/{1.73_m2} — AB (ref >90)
GLUCOSE: 87 mg/dL (ref 70–140)
Potassium: 4.5 mEq/L (ref 3.5–5.1)
SODIUM: 142 meq/L (ref 136–145)
TOTAL PROTEIN: 7 g/dL (ref 6.4–8.3)

## 2016-08-10 LAB — LACTATE DEHYDROGENASE: LDH: 189 U/L (ref 125–245)

## 2016-08-10 NOTE — Telephone Encounter (Signed)
Appointments scheduled per 08/10/16 los. Patient was given a copy of the AVS report and appointment schedule, per 08/10/16 los. °

## 2016-08-10 NOTE — Progress Notes (Signed)
[name]    HEMATOLOGY/ONCOLOGY CLINIC NOTE  Date of Service: .08/10/2016  Patient Care Team: Thressa Sheller, MD as PCP - General (Internal Medicine)  CHIEF COMPLAINTS: f/u for CLL  HISTORY OF PRESENTING ILLNESS: plz see initial consultation for details on initial presentation  INTERVAL HISTORY  Christopher Conner is here for his scheduled followup for CLL.  He notes no other acute new symptoms. Has been feeling well. No reported night sweats. No fevers or chills. No new enlarged lymph nodes. CBC is stable today with no significant change in his lymphocyte count. He recently had right shoulder rotator cuff surgery and he is healing well from it. He notes that he did have a colonoscopy with Dr. Collene Mares and that this did not show any concerning findings.   MEDICAL HISTORY:  Past Medical History:  Diagnosis Date  . Chronic lymphocytic leukemia ([place])   . Diabetes mellitus without complication ([place])   . GERD (gastroesophageal reflux disease)   . High cholesterol   . Hypertension   . Hyponatremia   . Normal pressure hydrocephalus     SURGICAL HISTORY: Past Surgical History:  Procedure Laterality Date  . APPENDECTOMY    . BACK SURGERY    . CATARACT EXTRACTION     left eye  . CYST REMOVAL NECK    . LUMBAR FUSION    . VENTRICULOPERITONEAL SHUNT      SOCIAL HISTORY: Social History   Social History  . Marital status: Married    Spouse name: [name]  . Number of children: [name]  . Years of education: [name]   Occupational History  . systems Counsellor (employer)   Social History Main Topics  . Smoking status: Never Smoker  . Smokeless tobacco: Never Used  . Alcohol use No  . Drug use: No  . Sexual activity: Not on file   Other Topics Concern  . Not on file   Social History Narrative  . No narrative on file    FAMILY HISTORY: Family History  Problem Relation Age of Onset  . Hypertension Mother   . Hyperlipidemia Mother   . Diabetes Mother   . Diabetes  Father     ALLERGIES:  has No Known Allergies.  MEDICATIONS:  Current Outpatient Prescriptions  Medication Sig Dispense Refill  . amLODipine (NORVASC) 10 MG tablet TK 1 T PO QD  2  . aspirin 81 MG tablet Take 81 mg by mouth daily.    [name] lisinopril-hydrochlorothiazide (PRINZIDE,ZESTORETIC) 20-12.5 MG tablet Take 2 tablets by mouth daily.    [name] NOVOLOG 100 UNIT/ML injection INJECT UP TO 100 UNITS DAILY MAXIMUM DOSE WITH INSULIN PUMP DAILY  1  . ondansetron (ZOFRAN ODT) 8 MG disintegrating tablet 8mg  ODT q6 hours prn nausea 8 tablet 0  . polyethylene glycol powder (MIRALAX) powder Take 17 g by mouth daily. 255 g 0  . Polyvinyl Alcohol-Povidone (REFRESH OP) Apply 1-2 drops to eye every 3 (three) hours as needed (dryness).    . pravastatin (PRAVACHOL) 40 MG tablet Take 40 mg by mouth every evening.      No current facility-administered medications for this visit.     REVIEW OF SYSTEMS:    10 Point review of Systems was done is negative except as noted above.  PHYSICAL EXAMINATION: ECOG PERFORMANCE STATUS: 1 - Symptomatic but completely ambulatory  . Vitals:   08/10/16 1326  BP: (!) 133/57  Pulse: 66  Resp: 16  Temp: 97.7 F (36.5 C)   Filed Weights  08/10/16 1326  Weight: 188 lb 2 oz (85.3 kg)   .Body mass index is 26.99 kg/m.  GENERAL:alert, in no acute distress and comfortable SKIN: skin color, texture, turgor are normal, no rashes or significant lesions EYES: normal, conjunctiva are pink and non-injected, sclera clear OROPHARYNX:no exudate, no erythema and lips, buccal mucosa, and tongue normal  NECK: supple, no JVD, thyroid normal size, non-tender, without nodularity LYMPH:  no palpable lymphadenopathy in the cervical, axillary or inguinal LUNGS: clear to auscultation with normal respiratory effort HEART: regular rate & rhythm,  no murmurs and no lower extremity edema ABDOMEN: abdomen soft, non-tender, normoactive bowel sounds ,no palpable  hepatosplenomegaly Musculoskeletal: no cyanosis of digits and no clubbing  PSYCH: alert & oriented x 3 with fluent speech NEURO: no focal motor/sensory deficits  LABORATORY DATA:  I have reviewed the data as listed  . CBC Latest Ref Rng & Units 08/10/2016 02/07/2016 10/03/2015  WBC 4.0 - 10.3 10e3/uL 16.2(H) 19.7(H) 17.0(H)  Hemoglobin 13.0 - 17.1 g/dL 13.8 13.0 12.9(L)  Hematocrit 38.4 - 49.9 % 40.5 37.4(L) 36.8(L)  Platelets 140 - 400 10e3/uL 273 288 295   . CBC    Component Value Date/Time   WBC 16.2 (H) 08/10/2016 1312   WBC 16.7 (H) 08/15/2015 2019   RBC 4.25 08/10/2016 1312   RBC 4.36 08/15/2015 2019   HGB 13.8 08/10/2016 1312   HCT 40.5 08/10/2016 1312   PLT 273 08/10/2016 1312   MCV 95.3 08/10/2016 1312   MCH 32.5 08/10/2016 1312   MCH 31.9 08/15/2015 2019   MCHC 34.1 08/10/2016 1312   MCHC 35.3 08/15/2015 2019   RDW 13.0 08/10/2016 1312   LYMPHSABS 11.1 (H) 08/10/2016 1312   MONOABS 0.6 08/10/2016 1312   EOSABS 0.2 08/10/2016 1312   BASOSABS 0.0 08/10/2016 1312     CMP Latest Ref Rng & Units 08/10/2016 02/07/2016 10/03/2015  Glucose 70 - 140 mg/dl 87 97 75  BUN 7.0 - 26.0 mg/dL 29.2(H) 28.3(H) 29.5(H)  Creatinine 0.7 - 1.3 mg/dL 1.1 1.3 1.3  Sodium 136 - 145 mEq/L 142 134(L) 134(L)  Potassium 3.5 - 5.1 mEq/L 4.5 4.4 4.4  Chloride 101 - 111 mmol/L - - -  CO2 22 - 29 mEq/L 30(H) 25 27  Calcium 8.4 - 10.4 mg/dL 9.6 9.5 9.2  Total Protein 6.4 - 8.3 g/dL 7.0 6.9 6.6  Total Bilirubin 0.20 - 1.20 mg/dL 0.38 0.35 0.35  Alkaline Phos 40 - 150 U/L 56 63 53  AST 5 - 34 U/L 48(H) 49(H) 49(H)  ALT 0 - 55 U/L 33 41 36  . Lab Results  Component Value Date   LDH 189 08/10/2016         ASSESSMENT & PLAN:   Christopher Conner is a very pleasant 66 year old Caucasian gentleman with  1) Rai Stage 0 CLL with lymphocytosis with no overt LNadenopathy or palpable hepatosplenomegaly. On presentation Absolute lymphocyte count of 8.8k with about 79 percent clonal lymphocytes on  flow cytometry consistent with CLL. nl Hgb and platelets.  Today on labs the patient's CBC is stable with hgb of 13.8, plt 273k and WBC count of 16.2 down from 19.7k with Absolute lymphocyte count stable at 11.2k down from 11.8k CLL prognostic FISH panel neg for commonly tested mutations. No constitutional symptoms. No new lymphadenopathy. Plan -Lab results discussed with the patient. -No indication for treatment of his CLL at this time -Return in about 6 months with labs with Dr Irene Limbo   2)Constipation. Being workup by his PCP. On  laxatives and Linzess to address this. Patient notes that he has been working with Dr. Collene Mares on his laxative regimen and that his constipation is much improved . He had a CT of the abdomen and pelvis on 06/13/2015  which showed no evidence of obstructing lesion. Moderate fecal burden throughout the colon. No lymphadenopathy in the abdomen or pelvis.  Colonoscopy was unrevealing for any concerning findings as per patient.  RTC with Dr Irene Limbo in 6 months with cbc, cmp and LDH. Earlier if any new concerns . [name] Orders Placed This Encounter  Procedures  . CBC & Diff and Retic    Standing Status:   Future    Standing Expiration Date:   08/10/2017  . Comprehensive metabolic panel    Standing Status:   Future    Standing Expiration Date:   08/10/2017  . Lactate dehydrogenase    Standing Status:   Future    Standing Expiration Date:   08/10/2017     All of the patients questions were answered to his apparent satisfaction. The patient knows to call the clinic with any problems, questions or concerns.  I spent 15 minutes counseling the patient face to face. The total time spent in the appointment was 20 minutes and more than 50% was on counseling and direct patient cares.    Sullivan Lone MD [place] AAHIVMS [facility] Hematology/Oncology Physician [facility]  (Office):       [phone] (Work cell):  [phone] (Fax):           [phone]

## 2016-08-11 DIAGNOSIS — M25511 Pain in right shoulder: Secondary | ICD-10-CM | POA: Diagnosis not present

## 2016-08-11 DIAGNOSIS — M6281 Muscle weakness (generalized): Secondary | ICD-10-CM | POA: Diagnosis not present

## 2016-08-11 DIAGNOSIS — M25611 Stiffness of right shoulder, not elsewhere classified: Secondary | ICD-10-CM | POA: Diagnosis not present

## 2016-08-17 DIAGNOSIS — M6281 Muscle weakness (generalized): Secondary | ICD-10-CM | POA: Diagnosis not present

## 2016-08-17 DIAGNOSIS — M25611 Stiffness of right shoulder, not elsewhere classified: Secondary | ICD-10-CM | POA: Diagnosis not present

## 2016-08-17 DIAGNOSIS — M25511 Pain in right shoulder: Secondary | ICD-10-CM | POA: Diagnosis not present

## 2016-08-25 DIAGNOSIS — M25611 Stiffness of right shoulder, not elsewhere classified: Secondary | ICD-10-CM | POA: Diagnosis not present

## 2016-08-25 DIAGNOSIS — M25511 Pain in right shoulder: Secondary | ICD-10-CM | POA: Diagnosis not present

## 2016-08-25 DIAGNOSIS — M6281 Muscle weakness (generalized): Secondary | ICD-10-CM | POA: Diagnosis not present

## 2016-08-31 DIAGNOSIS — M24111 Other articular cartilage disorders, right shoulder: Secondary | ICD-10-CM | POA: Diagnosis not present

## 2016-10-12 DIAGNOSIS — M24111 Other articular cartilage disorders, right shoulder: Secondary | ICD-10-CM | POA: Diagnosis not present

## 2016-12-29 DIAGNOSIS — E7800 Pure hypercholesterolemia, unspecified: Secondary | ICD-10-CM | POA: Diagnosis not present

## 2016-12-29 DIAGNOSIS — I10 Essential (primary) hypertension: Secondary | ICD-10-CM | POA: Diagnosis not present

## 2016-12-29 DIAGNOSIS — E1021 Type 1 diabetes mellitus with diabetic nephropathy: Secondary | ICD-10-CM | POA: Diagnosis not present

## 2016-12-29 DIAGNOSIS — Z0000 Encounter for general adult medical examination without abnormal findings: Secondary | ICD-10-CM | POA: Diagnosis not present

## 2017-01-05 DIAGNOSIS — I700 Atherosclerosis of aorta: Secondary | ICD-10-CM | POA: Diagnosis not present

## 2017-01-05 DIAGNOSIS — N182 Chronic kidney disease, stage 2 (mild): Secondary | ICD-10-CM | POA: Diagnosis not present

## 2017-01-05 DIAGNOSIS — R319 Hematuria, unspecified: Secondary | ICD-10-CM | POA: Diagnosis not present

## 2017-01-05 DIAGNOSIS — E1021 Type 1 diabetes mellitus with diabetic nephropathy: Secondary | ICD-10-CM | POA: Diagnosis not present

## 2017-01-05 DIAGNOSIS — N390 Urinary tract infection, site not specified: Secondary | ICD-10-CM | POA: Diagnosis not present

## 2017-01-05 DIAGNOSIS — I129 Hypertensive chronic kidney disease with stage 1 through stage 4 chronic kidney disease, or unspecified chronic kidney disease: Secondary | ICD-10-CM | POA: Diagnosis not present

## 2017-01-05 DIAGNOSIS — Z23 Encounter for immunization: Secondary | ICD-10-CM | POA: Diagnosis not present

## 2017-01-05 DIAGNOSIS — R002 Palpitations: Secondary | ICD-10-CM | POA: Diagnosis not present

## 2017-01-05 DIAGNOSIS — E785 Hyperlipidemia, unspecified: Secondary | ICD-10-CM | POA: Diagnosis not present

## 2017-01-18 ENCOUNTER — Emergency Department (HOSPITAL_COMMUNITY): Payer: Medicare Other

## 2017-01-18 ENCOUNTER — Emergency Department (HOSPITAL_COMMUNITY)
Admission: EM | Admit: 2017-01-18 | Discharge: 2017-01-18 | Disposition: A | Discharge status: Home / Self Care | Payer: Medicare Other | Attending: Emergency Medicine | Admitting: Emergency Medicine

## 2017-01-18 ENCOUNTER — Encounter (HOSPITAL_COMMUNITY): Payer: Self-pay | Admitting: *Deleted

## 2017-01-18 DIAGNOSIS — I10 Essential (primary) hypertension: Secondary | ICD-10-CM | POA: Diagnosis not present

## 2017-01-18 DIAGNOSIS — Z7982 Long term (current) use of aspirin: Secondary | ICD-10-CM | POA: Insufficient documentation

## 2017-01-18 DIAGNOSIS — E119 Type 2 diabetes mellitus without complications: Secondary | ICD-10-CM | POA: Diagnosis not present

## 2017-01-18 DIAGNOSIS — Z794 Long term (current) use of insulin: Secondary | ICD-10-CM | POA: Insufficient documentation

## 2017-01-18 DIAGNOSIS — Z79899 Other long term (current) drug therapy: Secondary | ICD-10-CM | POA: Insufficient documentation

## 2017-01-18 DIAGNOSIS — R42 Dizziness and giddiness: Secondary | ICD-10-CM | POA: Diagnosis not present

## 2017-01-18 HISTORY — DX: Chronic lymphocytic leukemia of B-cell type not having achieved remission: C91.10

## 2017-01-18 LAB — CBC
HCT: 40.4 % (ref 39.0–52.0)
HEMOGLOBIN: 14 g/dL (ref 13.0–17.0)
MCH: 32.3 pg (ref 26.0–34.0)
MCHC: 34.7 g/dL (ref 30.0–36.0)
MCV: 93.3 fL (ref 78.0–100.0)
Platelets: 302 10*3/uL (ref 150–400)
RBC: 4.33 MIL/uL (ref 4.22–5.81)
RDW: 12.4 % (ref 11.5–15.5)
WBC: 20.4 10*3/uL — AB (ref 4.0–10.5)

## 2017-01-18 LAB — BASIC METABOLIC PANEL
ANION GAP: 12 (ref 5–15)
BUN: 23 mg/dL — ABNORMAL HIGH (ref 6–20)
CALCIUM: 9.2 mg/dL (ref 8.9–10.3)
CO2: 24 mmol/L (ref 22–32)
CREATININE: 1.02 mg/dL (ref 0.61–1.24)
Chloride: 97 mmol/L — ABNORMAL LOW (ref 101–111)
GFR calc Af Amer: >60 mL/min (ref >60)
Glucose, Bld: 133 mg/dL — ABNORMAL HIGH (ref 65–99)
Potassium: 5 mmol/L (ref 3.5–5.1)
SODIUM: 133 mmol/L — AB (ref 135–145)

## 2017-01-18 LAB — CBG MONITORING, ED: Glucose-Capillary: 134 mg/dL — ABNORMAL HIGH (ref 65–99)

## 2017-01-18 MED ORDER — MECLIZINE HCL 25 MG PO TABS: 25.0000 mg | ORAL_TABLET | Freq: Three times a day (TID) | ORAL | 0 refills | Status: DC | PRN

## 2017-01-18 MED ORDER — DIPHENHYDRAMINE HCL 50 MG/ML IJ SOLN
25.0000 mg | Freq: Once | INTRAMUSCULAR | Status: AC
  Administered 2017-01-18: 25 mg via INTRAVENOUS
  Filled 2017-01-18: qty 1

## 2017-01-18 MED ORDER — MECLIZINE HCL 25 MG PO TABS
25.0000 mg | ORAL_TABLET | Freq: Once | ORAL | Status: AC
  Administered 2017-01-18: 25 mg via ORAL
  Filled 2017-01-18: qty 1

## 2017-01-18 MED ORDER — PROCHLORPERAZINE EDISYLATE 5 MG/ML IJ SOLN
10.0000 mg | Freq: Once | INTRAMUSCULAR | Status: AC
  Administered 2017-01-18: 10 mg via INTRAVENOUS
  Filled 2017-01-18: qty 2

## 2017-01-18 MED ORDER — SODIUM CHLORIDE 0.9 % IV BOLUS (SEPSIS)
1000.0000 mL | Freq: Once | INTRAVENOUS | Status: AC
  Administered 2017-01-18: 1000 mL via INTRAVENOUS

## 2017-01-18 NOTE — Discharge Instructions (Signed)
Take the meclizine regularly for the next couple days. Then take it as needed. Return for inability to walk or sudden worsening symptoms.

## 2017-01-18 NOTE — ED Notes (Signed)
Ambulated pt.down hallway (to staff bathroom) and back at 17:12. Pt. Stated that he felt "a lot better" than earlier, but showed some unsteadiness. He did not complain of dizziness, but did take his time getting up out of the bed, and also used the hallway side rails to steady himself. He also stated that his pupils were taking a while to adjust to the light, but it was still "better than earlier" and that "they usually take a while to adapt".

## 2017-01-18 NOTE — ED Triage Notes (Signed)
Pt states he was normal last nite and then this am woke up with dizziness, weakness, and vomiting.  Pt feels like his eyes are just moving even with them shut and sensitive to light.  Pt is diabetic with insulin pump, has active VP shunt, and CLL.

## 2017-01-18 NOTE — ED Provider Notes (Signed)
[facility] DEPT Provider Note   CSN: [id number] Arrival date & time: 01/18/17  1357     History   Chief Complaint Chief Complaint  Patient presents with  . Dizziness  . Weakness  . Emesis    HPI Christopher Conner is a 66 y.o. male.  66 yo M with a chief complaint of vertigo. Patient woke up this morning and felt like the room was spinning around him. Whenever he moved his head or his eyes it made him severely dizzy to the point where he had to vomit. He has been lying still for half a day and has not improved. He denies any change in his hearing denies head injury denies neck pain or fever. He has had this a few times in the past. 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 He has severe photophobia with this as well.   The history is provided by the patient and the spouse.  Illness  This is a new problem. The current episode started 6 to 12 hours ago. The problem occurs constantly. The problem has not changed since onset.Pertinent negatives include no chest pain, no abdominal pain, no headaches and no shortness of breath. Exacerbated by: head movement, eye movement. Relieved by: lying still. He has tried nothing for the symptoms. The treatment provided no relief.    Past Medical History:  Diagnosis Date  . Chronic lymphocytic leukemia ([place])   . CLL (chronic lymphocytic leukemia) ([place])   . Diabetes mellitus without complication ([place])   . GERD (gastroesophageal reflux disease)   . High cholesterol   . Hypertension   . Hyponatremia   . Normal pressure hydrocephalus     Patient Active Problem List   Diagnosis Date Noted  . CLL (chronic lymphocytic leukemia) ([place]) 06/05/2015  . Lymphocytosis 05/27/2015  . 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 Hyponatremia 04/10/2014  . Diabetes ([place]) 04/10/2014  . Hypertension 04/10/2014  . Dizziness 04/09/2014    Past Surgical History:  Procedure Laterality Date  . APPENDECTOMY    . BACK SURGERY    . CATARACT  EXTRACTION     left eye  . CYST REMOVAL NECK    . LUMBAR FUSION    . VENTRICULOPERITONEAL SHUNT         Home Medications    Prior to Admission medications   Medication Sig Start Date End Date Taking? Authorizing Provider  amLODipine (NORVASC) 10 MG tablet TK 1 T PO QD 07/20/16  Yes [provider]  aspirin 81 MG tablet Take 81 mg by mouth daily.   Yes [provider]  lisinopril-hydrochlorothiazide (PRINZIDE,ZESTORETIC) 20-12.5 MG tablet Take 1 tablet by mouth daily.    Yes [provider]  NOVOLOG 100 UNIT/ML injection INJECT UP TO 100 UNITS DAILY MAXIMUM DOSE WITH INSULIN PUMP DAILY 08/05/16  Yes [provider]  ondansetron (ZOFRAN ODT) 8 MG disintegrating tablet 8mg  ODT q6 hours prn nausea 08/16/15  Yes Palumbo, April, MD  Polyvinyl Alcohol-Povidone (REFRESH OP) Apply 1-2 drops to eye every 3 (three) hours as needed (dryness).   Yes [provider]  pravastatin (PRAVACHOL) 40 MG tablet Take 40 mg by mouth every evening.    Yes [provider]  meclizine (ANTIVERT) 25 MG tablet Take 1 tablet (25 mg total) by mouth 3 (three) times daily as needed for dizziness. 01/18/17   Deno Etienne, DO    Family History Family History  Problem Relation Age of Onset  . Hypertension Mother   . Hyperlipidemia Mother   .  Diabetes Mother   . Diabetes Father     Social History Social History  Substance Use Topics  . Smoking status: Never Smoker  . Smokeless tobacco: Never Used  . Alcohol use No     Allergies   Latex   Review of Systems Review of Systems  Constitutional: Negative for chills and fever.  HENT: Negative for congestion and facial swelling.   Eyes: Positive for photophobia. Negative for discharge and visual disturbance.  Respiratory: Negative for shortness of breath.   Cardiovascular: Negative for chest pain and palpitations.  Gastrointestinal: Positive for nausea and vomiting. Negative for abdominal pain and diarrhea.    Musculoskeletal: Negative for arthralgias and myalgias.  Skin: Negative for color change and rash.  Neurological: Positive for dizziness. Negative for tremors, syncope and headaches.  Psychiatric/Behavioral: Negative for confusion and dysphoric mood.     Physical Exam Updated Vital Signs BP (!) 122/56   Pulse 84   Temp (!) 97.4 F (36.3 C) (Oral)   Resp 16   SpO2 94%   Physical Exam  Constitutional: He is oriented to person, place, and time. He appears well-developed and well-nourished.  HENT:  Head: Normocephalic and atraumatic.  Eyes: Pupils are equal, round, and reactive to light. EOM are normal.  Neck: Normal range of motion. Neck supple. No JVD present.  Cardiovascular: Normal rate and regular rhythm.  Exam reveals no gallop and no friction rub.   No murmur heard. Pulmonary/Chest: No respiratory distress. He has no wheezes.  Abdominal: He exhibits no distension and no mass. There is no tenderness. There is no rebound and no guarding.  Musculoskeletal: Normal range of motion.  Neurological: He is alert and oriented to person, place, and time. He has normal strength. No cranial nerve deficit or sensory deficit. GCS eye subscore is 4. GCS verbal subscore is 5. GCS motor subscore is 6.  Photophobia.  Limited ability to check EOM.  Limited exam due to extend of vertigo  Skin: No rash noted. No pallor.  Psychiatric: He has a normal mood and affect. His behavior is normal.  Nursing note and vitals reviewed.    ED Treatments / Results  Labs (all labs ordered are listed, but only abnormal results are displayed) Labs Reviewed  BASIC METABOLIC PANEL - Abnormal; Notable for the following:       Result Value   Sodium 133 (*)    Chloride 97 (*)    Glucose, Bld 133 (*)    BUN 23 (*)    All other components within normal limits  CBC - Abnormal; Notable for the following:    WBC 20.4 (*)    All other components within normal limits  CBG MONITORING, ED - Abnormal; Notable for  the following:    Glucose-Capillary 134 (*)    All other components within normal limits  CBG MONITORING, ED    EKG  EKG Interpretation  Date/Time:  Monday January 18 2017 15:38:02 EDT Ventricular Rate:  82 PR Interval:    QRS Duration: 74 QT Interval:  378 QTC Calculation: 442 R Axis:   59 Text Interpretation:  Sinus rhythm Anteroseptal infarct, age indeterminate No significant change since last tracing Confirmed by Deno Etienne [phone]) on 01/18/2017 3:40:57 PM       Radiology Ct Head Wo Contrast  Result Date: 01/18/2017 CLINICAL DATA:  Vertigo, blurry vision EXAM: CT HEAD WITHOUT CONTRAST TECHNIQUE: Contiguous axial images were obtained from the base of the skull through the vertex without intravenous contrast. COMPARISON:  08/16/2015 FINDINGS:  Brain: Ventriculostomy catheter is again noted on right. The right lateral ventricle is predominately decompressed. The left lateral ventricle appears within normal limits. No findings to suggest acute hemorrhage, acute infarction or space-occupying mass lesion are noted. Vascular: No hyperdense vessel or unexpected calcification. Skull: Normal. Negative for fracture or focal lesion. Sinuses/Orbits: Stable changes in the orbits bilaterally. Other: None IMPRESSION: Chronic changes without acute abnormality. 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 Electronically Signed   By: Inez Catalina M.D.   On: 01/18/2017 16:39    Procedures Procedures (including critical care time)  Medications Ordered in ED Medications  sodium chloride 0.9 % bolus 1,000 mL (0 mLs Intravenous Stopped 01/18/17 1756)  prochlorperazine (COMPAZINE) injection 10 mg (10 mg Intravenous Given 01/18/17 1536)  diphenhydrAMINE (BENADRYL) injection 25 mg (25 mg Intravenous Given 01/18/17 1536)  meclizine (ANTIVERT) tablet 25 mg (25 mg Oral Given 01/18/17 1715)     Initial Impression / Assessment and Plan / ED Course  I have reviewed the triage vital signs and the  nursing notes.  Pertinent labs & imaging results that were available during my care of the patient were reviewed by me and considered in my medical decision making (see chart for details).     66 yo M with vertigo.  With photophobia ? Atypical migraine.  Will give migraine cocktail, fluids. CT head, labs.   Significant improvement post headache cocktail. No noted nystagmus on repeat eye exam. No cerebellar signs.Will attempt to ambulate. Able to ambulate.  Discussed with patient that we are unable to rule out stroke without further imaging.  Patient feels confident that this feels much better and is very similar to his prior peripheral vertigo.  Given neuro follow up.   11:15 PM:  I have discussed the diagnosis/risks/treatment options with the patient and family and believe the pt to be eligible for discharge home to follow-up with PCP. We also discussed returning to the ED immediately if new or worsening sx occur. We discussed the sx which are most concerning (e.g., sudden worsening pain, fever, inability to tolerate by mouth) that necessitate immediate return. Medications administered to the patient during their visit and any new prescriptions provided to the patient are listed below.  Medications given during this visit Medications  sodium chloride 0.9 % bolus 1,000 mL (0 mLs Intravenous Stopped 01/18/17 1756)  prochlorperazine (COMPAZINE) injection 10 mg (10 mg Intravenous Given 01/18/17 1536)  diphenhydrAMINE (BENADRYL) injection 25 mg (25 mg Intravenous Given 01/18/17 1536)  meclizine (ANTIVERT) tablet 25 mg (25 mg Oral Given 01/18/17 1715)     The patient appears reasonably screen and/or stabilized for discharge and I doubt any other medical condition or other [facility] requiring further screening, evaluation, or treatment in the ED at this time prior to discharge.    Final Clinical Impressions(s) / ED Diagnoses   Final diagnoses:  Vertigo    New Prescriptions Discharge Medication List as  of 01/18/2017  5:29 PM    START taking these medications   Details  meclizine (ANTIVERT) 25 MG tablet Take 1 tablet (25 mg total) by mouth 3 (three) times daily as needed for dizziness., Starting Mon 01/18/2017, [place], Linna Hoff, DO 01/18/17 2316

## 2017-01-25 DIAGNOSIS — G629 Polyneuropathy, unspecified: Secondary | ICD-10-CM | POA: Diagnosis not present

## 2017-01-25 DIAGNOSIS — I10 Essential (primary) hypertension: Secondary | ICD-10-CM | POA: Diagnosis not present

## 2017-01-25 DIAGNOSIS — Z23 Encounter for immunization: Secondary | ICD-10-CM | POA: Diagnosis not present

## 2017-01-25 DIAGNOSIS — E785 Hyperlipidemia, unspecified: Secondary | ICD-10-CM | POA: Diagnosis not present

## 2017-01-25 DIAGNOSIS — E1021 Type 1 diabetes mellitus with diabetic nephropathy: Secondary | ICD-10-CM | POA: Diagnosis not present

## 2017-01-25 DIAGNOSIS — Z9641 Presence of insulin pump (external) (internal): Secondary | ICD-10-CM | POA: Diagnosis not present

## 2017-01-26 NOTE — Progress Notes (Signed)
Christopher Conner    HEMATOLOGY/ONCOLOGY CLINIC NOTE  Date of Service: 02/09/17  Patient Care Team: Thressa Sheller, MD as PCP - General (Internal Medicine)  CHIEF COMPLAINTS: f/u for CLL  HISTORY OF PRESENTING ILLNESS: plz see initial consultation for details on initial presentation  INTERVAL HISTORY  Christopher Conner is here for his scheduled followup for CLL. He reports that he is doing well overall. He is now seriously considering retirement to spend more time with his wife, Christopher Conner. 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 He reports that he will properly taper prior to retirement. He has had recent stress factors of his family.    Since he last visit to the ED he has had a CT head completed on 01/18/2017 due to vertigo-like symptoms with results showing: IMPRESSION: Chronic changes without acute abnormality. 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   On review of systems, he denies fever or chills. He reports resolved night sweats, but notes that every couple weeks he will wake up mildly damp. Denies lymphadenopathy. Denies constipation at this time and notes that it is alleviated with magnesium.     MEDICAL HISTORY:  Past Medical History:  Diagnosis Date  . Chronic lymphocytic leukemia (Christopher Conner)   . CLL (chronic lymphocytic leukemia) (Christopher Conner)   . Diabetes mellitus without complication (Christopher Conner)   . GERD (gastroesophageal reflux disease)   . High cholesterol   . Hypertension   . Hyponatremia   . Normal pressure hydrocephalus     SURGICAL HISTORY: Past Surgical History:  Procedure Laterality Date  . APPENDECTOMY    . BACK SURGERY    . CATARACT EXTRACTION     left eye  . CYST REMOVAL NECK    . LUMBAR FUSION    . VENTRICULOPERITONEAL SHUNT      SOCIAL HISTORY: Social History   Social History  . Marital status: Married    Spouse name: Christopher Conner  . Number of children: Christopher Conner  . Years of education: Christopher Conner   Occupational History  . systems Counsellor  (employer)   Social History Main Topics  . Smoking status: Never Smoker  . Smokeless tobacco: Never Used  . Alcohol use No  . Drug use: No  . Sexual activity: Not on file   Other Topics Concern  . Not on file   Social History Narrative  . No narrative on file    FAMILY HISTORY: Family History  Problem Relation Age of Onset  . Hypertension Mother   . Hyperlipidemia Mother   . Diabetes Mother   . Diabetes Father     ALLERGIES:  is allergic to latex.  MEDICATIONS:  Current Outpatient Prescriptions  Medication Sig Dispense Refill  . amLODipine (NORVASC) 10 MG tablet TK 1 T PO QD  2  . aspirin 81 MG tablet Take 81 mg by mouth daily.    Christopher Conner lisinopril-hydrochlorothiazide (PRINZIDE,ZESTORETIC) 20-12.5 MG tablet Take 1 tablet by mouth daily.     . meclizine (ANTIVERT) 25 MG tablet Take 1 tablet (25 mg total) by mouth 3 (three) times daily as needed for dizziness. 30 tablet 0  . NOVOLOG 100 UNIT/ML injection INJECT UP TO 100 UNITS DAILY MAXIMUM DOSE WITH INSULIN PUMP DAILY  1  . ondansetron (ZOFRAN ODT) 8 MG disintegrating tablet 8mg  ODT q6 hours prn nausea 8 tablet 0  . Polyvinyl Alcohol-Povidone (REFRESH OP) Apply 1-2 drops to eye every 3 (three) hours as needed (dryness).    . pravastatin (PRAVACHOL) 40 MG  tablet Take 40 mg by mouth every evening.      No current facility-administered medications for this visit.     REVIEW OF SYSTEMS:    10 Point review of Systems was done is negative except as noted above.  PHYSICAL EXAMINATION: ECOG PERFORMANCE STATUS: 1 - Symptomatic but completely ambulatory  . Vitals:   02/09/17 1338  BP: 134/64  Pulse: 67  Resp: 18  Temp: 98.5 F (36.9 C)  SpO2: 100%   Filed Weights   02/09/17 1338  Weight: 189 lb 9.6 oz (86 kg)   .Body mass index is 27.2 kg/m.  GENERAL:alert, in no acute distress and comfortable SKIN: skin color, texture, turgor are normal, no rashes or significant lesions EYES: normal, conjunctiva are pink and  non-injected, sclera clear OROPHARYNX:no exudate, no erythema and lips, buccal mucosa, and tongue normal  NECK: supple, no JVD, thyroid normal size, non-tender, without nodularity LYMPH:  no palpable lymphadenopathy in the cervical, axillary or inguinal LUNGS: clear to auscultation with normal respiratory effort HEART: regular rate & rhythm,  no murmurs and no lower extremity edema ABDOMEN: abdomen soft, non-tender, normoactive bowel sounds ,no palpable hepatosplenomegaly Musculoskeletal: no cyanosis of digits and no clubbing  PSYCH: alert & oriented x 3 with fluent speech NEURO: no focal motor/sensory deficits   LABORATORY DATA:  I have reviewed the data as listed  . CBC Latest Ref Rng & Units 02/09/2017 01/18/2017 08/10/2016  WBC 4.0 - 10.3 10e3/uL 19.6(H) 20.4(H) 16.2(H)  Hemoglobin 13.0 - 17.1 g/dL 12.9(L) 14.0 13.8  Hematocrit 38.4 - 49.9 % 38.6 40.4 40.5  Platelets 140 - 400 10e3/uL 290 302 273   . CBC    Component Value Date/Time   WBC 19.6 (H) 02/09/2017 1302   WBC 20.4 (H) 01/18/2017 1414   RBC 3.98 (L) 02/09/2017 1302   RBC 4.33 01/18/2017 1414   HGB 12.9 (L) 02/09/2017 1302   HCT 38.6 02/09/2017 1302   PLT 290 02/09/2017 1302   MCV 97.0 02/09/2017 1302   MCH 32.4 02/09/2017 1302   MCH 32.3 01/18/2017 1414   MCHC 33.4 02/09/2017 1302   MCHC 34.7 01/18/2017 1414   RDW 12.7 02/09/2017 1302   LYMPHSABS 12.9 (H) 02/09/2017 1302   MONOABS 0.7 02/09/2017 1302   EOSABS 0.2 02/09/2017 1302   BASOSABS 0.0 02/09/2017 1302     CMP Latest Ref Rng & Units 02/09/2017 01/18/2017 08/10/2016  Glucose 70 - 140 mg/dl 101 133(H) 87  BUN 7.0 - 26.0 mg/dL 23.9 23(H) 29.2(H)  Creatinine 0.7 - 1.3 mg/dL 1.1 1.02 1.1  Sodium 136 - 145 mEq/L 137 133(L) 142  Potassium 3.5 - 5.1 mEq/L 5.0 5.0 4.5  Chloride 101 - 111 mmol/L - 97(L) -  CO2 22 - 29 mEq/L 27 24 30(H)  Calcium 8.4 - 10.4 mg/dL 9.3 9.2 9.6  Total Protein 6.4 - 8.3 g/dL 6.7 - 7.0  Total Bilirubin 0.20 - 1.20 mg/dL 0.39 -  0.38  Alkaline Phos 40 - 150 U/L 54 - 56  AST 5 - 34 U/L 32 - 48(H)  ALT 0 - 55 U/L 30 - 33  . Lab Results  Component Value Date   LDH 185 02/09/2017         ASSESSMENT & PLAN:   Christopher Conner is a very pleasant 66 year old Caucasian gentleman with  1) Rai Stage 0 CLL with lymphocytosis with no overt LNadenopathy or palpable hepatosplenomegaly. On presentation Absolute lymphocyte count of 8.8k with about 79 percent clonal lymphocytes on flow cytometry consistent with  CLL. nl Hgb and platelets.  Today (02/09/2017) on the labs, the patient's hbg is at 12.9, plt improved to 290k and WBC count of 19.6 with absolute lymphocyte count at 12.9.   CLL prognostic FISH panel neg for commonly tested mutations. No constitutional symptoms. No new lymphadenopathy. Plan -Lab results discussed with the patient. -No indication for treatment of his CLL at this time -Return in about 6 months with labs with Dr Irene Limbo   2)Constipation. Being mx by his PCP. On laxatives and Linzess to address this. Patient notes that he has been working with Dr. Collene Mares on his laxative regimen and that his constipation is much improved . He had a CT of the abdomen and pelvis on 06/13/2015  which showed no evidence of obstructing lesion. Moderate fecal burden throughout the colon. No lymphadenopathy in the abdomen or pelvis.  Colonoscopy was unrevealing for any concerning findings as per patient.  RTC with Dr Irene Limbo in 6 months with cbc, cmp and LDH. Earlier if any new concerns . Christopher Conner Orders Placed This Encounter  Procedures  . CBC & Diff and Retic    Standing Status:   Future    Standing Expiration Date:   02/09/2018  . Comprehensive metabolic panel    Standing Status:   Future    Standing Expiration Date:   02/09/2018  . Lactate dehydrogenase    Standing Status:   Future    Standing Expiration Date:   02/09/2018     All of the patients questions were answered to his apparent satisfaction. The patient knows to call the  clinic with any problems, questions or concerns.  I spent 20 minutes counseling the patient face to face. The total time spent in the appointment was 25 minutes and more than 50% was on counseling and direct patient cares.    Sullivan Lone MD [place] AAHIVMS [facility] Hematology/Oncology Physician [place]  (Office):       [phone] (Work cell):  [phone] (Fax):           [phone]   This document serves as a record of services personally performed by Sullivan Lone, MD. It was created on her behalf by Steva Colder, a trained medical scribe. The creation of this record is based on the scribe's personal observations and the provider's statements to them. This document has been checked and approved by the attending provider.

## 2017-02-09 ENCOUNTER — Other Ambulatory Visit (HOSPITAL_BASED_OUTPATIENT_CLINIC_OR_DEPARTMENT_OTHER): Payer: Medicare Other

## 2017-02-09 ENCOUNTER — Ambulatory Visit (HOSPITAL_BASED_OUTPATIENT_CLINIC_OR_DEPARTMENT_OTHER): Payer: Medicare Other | Admitting: Hematology

## 2017-02-09 VITALS — BP 134/64 | HR 67 | Temp 98.5°F | Resp 18 | Ht 70.0 in | Wt 189.6 lb

## 2017-02-09 DIAGNOSIS — C9110 Chronic lymphocytic leukemia of B-cell type not having achieved remission: Secondary | ICD-10-CM

## 2017-02-09 DIAGNOSIS — K5900 Constipation, unspecified: Secondary | ICD-10-CM | POA: Diagnosis not present

## 2017-02-09 LAB — CBC & DIFF AND RETIC
BASO%: 0.2 % (ref 0.0–2.0)
BASOS ABS: 0 10*3/uL (ref 0.0–0.1)
EOS ABS: 0.2 10*3/uL (ref 0.0–0.5)
EOS%: 1.1 % (ref 0.0–7.0)
HEMATOCRIT: 38.6 % (ref 38.4–49.9)
HEMOGLOBIN: 12.9 g/dL — AB (ref 13.0–17.1)
IMMATURE RETIC FRACT: 1.1 % — AB (ref 3.00–10.60)
LYMPH#: 12.9 10*3/uL — AB (ref 0.9–3.3)
LYMPH%: 65.9 % — ABNORMAL HIGH (ref 14.0–49.0)
MCH: 32.4 pg (ref 27.2–33.4)
MCHC: 33.4 g/dL (ref 32.0–36.0)
MCV: 97 fL (ref 79.3–98.0)
MONO#: 0.7 10*3/uL (ref 0.1–0.9)
MONO%: 3.8 % (ref 0.0–14.0)
NEUT#: 5.7 10*3/uL (ref 1.5–6.5)
NEUT%: 29 % — ABNORMAL LOW (ref 39.0–75.0)
Platelets: 290 10*3/uL (ref 140–400)
RBC: 3.98 10*6/uL — ABNORMAL LOW (ref 4.20–5.82)
RDW: 12.7 % (ref 11.0–14.6)
RETIC %: 1.23 % (ref 0.80–1.80)
RETIC CT ABS: 48.95 10*3/uL (ref 34.80–93.90)
WBC: 19.6 10*3/uL — ABNORMAL HIGH (ref 4.0–10.3)

## 2017-02-09 LAB — COMPREHENSIVE METABOLIC PANEL
ALBUMIN: 4 g/dL (ref 3.5–5.0)
ALT: 30 U/L (ref 0–55)
AST: 32 U/L (ref 5–34)
Alkaline Phosphatase: 54 U/L (ref 40–150)
Anion Gap: 7 mEq/L (ref 3–11)
BILIRUBIN TOTAL: 0.39 mg/dL (ref 0.20–1.20)
BUN: 23.9 mg/dL (ref 7.0–26.0)
CO2: 27 meq/L (ref 22–29)
CREATININE: 1.1 mg/dL (ref 0.7–1.3)
Calcium: 9.3 mg/dL (ref 8.4–10.4)
Chloride: 103 mEq/L (ref 98–109)
EGFR: >60 mL/min/{1.73_m2} (ref >60)
GLUCOSE: 101 mg/dL (ref 70–140)
Potassium: 5 mEq/L (ref 3.5–5.1)
Sodium: 137 mEq/L (ref 136–145)
TOTAL PROTEIN: 6.7 g/dL (ref 6.4–8.3)

## 2017-02-09 LAB — LACTATE DEHYDROGENASE: LDH: 185 U/L (ref 125–245)

## 2017-02-15 ENCOUNTER — Telehealth: Payer: Self-pay | Admitting: Hematology

## 2017-02-15 NOTE — Telephone Encounter (Signed)
Scheduled appt per 10/23 los. Left voicemail for patient regarding this information.

## 2017-02-24 DIAGNOSIS — G629 Polyneuropathy, unspecified: Secondary | ICD-10-CM | POA: Diagnosis not present

## 2017-02-24 DIAGNOSIS — R319 Hematuria, unspecified: Secondary | ICD-10-CM | POA: Diagnosis not present

## 2017-03-04 DIAGNOSIS — I700 Atherosclerosis of aorta: Secondary | ICD-10-CM | POA: Diagnosis not present

## 2017-03-04 DIAGNOSIS — R319 Hematuria, unspecified: Secondary | ICD-10-CM | POA: Diagnosis not present

## 2017-03-04 DIAGNOSIS — I10 Essential (primary) hypertension: Secondary | ICD-10-CM | POA: Diagnosis not present

## 2017-03-04 DIAGNOSIS — E7800 Pure hypercholesterolemia, unspecified: Secondary | ICD-10-CM | POA: Diagnosis not present

## 2017-05-25 DIAGNOSIS — E103593 Type 1 diabetes mellitus with proliferative diabetic retinopathy without macular edema, bilateral: Secondary | ICD-10-CM | POA: Diagnosis not present

## 2017-05-25 DIAGNOSIS — Z961 Presence of intraocular lens: Secondary | ICD-10-CM | POA: Diagnosis not present

## 2017-05-25 DIAGNOSIS — H52201 Unspecified astigmatism, right eye: Secondary | ICD-10-CM | POA: Diagnosis not present

## 2017-07-13 DIAGNOSIS — E1021 Type 1 diabetes mellitus with diabetic nephropathy: Secondary | ICD-10-CM | POA: Diagnosis not present

## 2017-07-13 DIAGNOSIS — E785 Hyperlipidemia, unspecified: Secondary | ICD-10-CM | POA: Diagnosis not present

## 2017-07-13 DIAGNOSIS — N182 Chronic kidney disease, stage 2 (mild): Secondary | ICD-10-CM | POA: Diagnosis not present

## 2017-07-20 DIAGNOSIS — E109 Type 1 diabetes mellitus without complications: Secondary | ICD-10-CM | POA: Diagnosis not present

## 2017-07-20 DIAGNOSIS — E7800 Pure hypercholesterolemia, unspecified: Secondary | ICD-10-CM | POA: Diagnosis not present

## 2017-07-20 DIAGNOSIS — Z9641 Presence of insulin pump (external) (internal): Secondary | ICD-10-CM | POA: Diagnosis not present

## 2017-07-20 DIAGNOSIS — I10 Essential (primary) hypertension: Secondary | ICD-10-CM | POA: Diagnosis not present

## 2017-07-27 DIAGNOSIS — G479 Sleep disorder, unspecified: Secondary | ICD-10-CM | POA: Diagnosis not present

## 2017-07-27 DIAGNOSIS — I10 Essential (primary) hypertension: Secondary | ICD-10-CM | POA: Diagnosis not present

## 2017-07-27 DIAGNOSIS — E7800 Pure hypercholesterolemia, unspecified: Secondary | ICD-10-CM | POA: Diagnosis not present

## 2017-07-27 DIAGNOSIS — R079 Chest pain, unspecified: Secondary | ICD-10-CM | POA: Diagnosis not present

## 2017-07-27 DIAGNOSIS — G912 (Idiopathic) normal pressure hydrocephalus: Secondary | ICD-10-CM | POA: Diagnosis not present

## 2017-07-27 DIAGNOSIS — K210 Gastro-esophageal reflux disease with esophagitis: Secondary | ICD-10-CM | POA: Diagnosis not present

## 2017-08-15 NOTE — Progress Notes (Signed)
[name]    HEMATOLOGY/ONCOLOGY CLINIC NOTE  Date of Service: 08/16/17  Patient Care Team: Thressa Sheller, MD as PCP - General (Internal Medicine)  CHIEF COMPLAINTS: f/u for CLL  HISTORY OF PRESENTING ILLNESS: plz see initial consultation for details on initial presentation  INTERVAL HISTORY  [name] is here for his scheduled followup for CLL. The patient's last visit with Christopher Conner was on 02/09/17. The pt reports that he is doing well overall.   The pt reports no new concerns since his last visit. He notes that he has no trouble sleeping, and denies night sweats, fevers, and chills. He notes that he is trying to optimize his constipation management with Dr. Collene Mares.   Lab results today (08/16/17) of CBC, CMP, and Reticulocytes is as follows: all values are WNL except for WBC at 16.9k, RBC at 4.11, Lymphs Abs at 11.4k, AST at 44. LDH 08/16/17 is WNL at 211.   On review of systems, pt reports constipation, and denies fevers, chills, night sweats, noticing any new lumps or bumps, pain along the spine, back pain, abdominal pains, leg swelling, testicular pain or swelling, and any other symptoms.   MEDICAL HISTORY:  Past Medical History:  Diagnosis Date  . Chronic lymphocytic leukemia ([place])   . CLL (chronic lymphocytic leukemia) ([place])   . Diabetes mellitus without complication ([place])   . GERD (gastroesophageal reflux disease)   . High cholesterol   . Hypertension   . Hyponatremia   . Normal pressure hydrocephalus     SURGICAL HISTORY: Past Surgical History:  Procedure Laterality Date  . APPENDECTOMY    . BACK SURGERY    . CATARACT EXTRACTION     left eye  . CYST REMOVAL NECK    . LUMBAR FUSION    . VENTRICULOPERITONEAL SHUNT      SOCIAL HISTORY: Social History   Socioeconomic History  . Marital status: Married    Spouse name: Not on file  . Number of children: Not on file  . Years of education: Not on file  . Highest education level: Not on file  Occupational History  .  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 Financial resource strain: Not on file  . Food insecurity:    Worry: Not on file    Inability: Not on file  . Transportation needs:    Medical: Not on file    Non-medical: Not on file  Tobacco Use  . Smoking status: Never Smoker  . Smokeless tobacco: Never Used  Substance and Sexual Activity  . Alcohol use: No  . Drug use: No  . Sexual activity: Not on file  Lifestyle  . Physical activity:    Days per week: Not on file    Minutes per session: Not on file  . Stress: Not on file  Relationships  . Social connections:    Talks on phone: Not on file    Gets together: Not on file    Attends religious service: Not on file    Active member of club or organization: Not on file    Attends meetings of clubs or organizations: Not on file    Relationship status: Not on file  . Intimate partner violence:    Fear of current or ex partner: Not on file    Emotionally abused: Not on file    Physically abused: Not on file    Forced sexual activity: Not on file  Other Topics Concern  . Not on  file  Social History Narrative  . Not on file    FAMILY HISTORY: Family History  Problem Relation Age of Onset  . Hypertension Mother   . Hyperlipidemia Mother   . Diabetes Mother   . Diabetes Father     ALLERGIES:  is allergic to latex.  MEDICATIONS:  Current Outpatient Medications  Medication Sig Dispense Refill  . amLODipine (NORVASC) 10 MG tablet TK 1 T PO QD  2  . aspirin 81 MG tablet Take 81 mg by mouth daily.    [name] atorvastatin (LIPITOR) 40 MG tablet Take 40 mg by mouth daily.    [name] lisinopril-hydrochlorothiazide (PRINZIDE,ZESTORETIC) 20-12.5 MG tablet Take 1 tablet by mouth daily.     . meclizine (ANTIVERT) 25 MG tablet Take 1 tablet (25 mg total) by mouth 3 (three) times daily as needed for dizziness. 30 tablet 0  . NOVOLOG 100 UNIT/ML injection INJECT UP TO 100 UNITS DAILY MAXIMUM DOSE  WITH INSULIN PUMP DAILY  1  . ondansetron (ZOFRAN ODT) 8 MG disintegrating tablet 8mg  ODT q6 hours prn nausea 8 tablet 0  . Polyvinyl Alcohol-Povidone (REFRESH OP) Apply 1-2 drops to eye every 3 (three) hours as needed (dryness).     No current facility-administered medications for this visit.     REVIEW OF SYSTEMS:   .10 Point review of Systems was done is negative except as noted above.   PHYSICAL EXAMINATION: ECOG PERFORMANCE STATUS: 1 - Symptomatic but completely ambulatory  . Vitals:   08/16/17 1420  BP: 126/61  Pulse: 61  Resp: 17  Temp: 98.1 F (36.7 C)  SpO2: 99%   Filed Weights   08/16/17 1420  Weight: 188 lb 3.2 oz (85.4 kg)   .Body mass index is 27 kg/m. [name] GENERAL:alert, in no acute distress and comfortable SKIN: no acute rashes, no significant lesions EYES: conjunctiva are pink and non-injected, sclera anicteric OROPHARYNX: MMM, no exudates, no oropharyngeal erythema or ulceration NECK: supple, no JVD LYMPH:  no palpable lymphadenopathy in the cervical, axillary or inguinal regions LUNGS: clear to auscultation b/l with normal respiratory effort HEART: regular rate & rhythm ABDOMEN:  normoactive bowel sounds , non tender, not distended. Extremity: no pedal edema PSYCH: alert & oriented x 3 with fluent speech NEURO: no focal motor/sensory deficits  LABORATORY DATA:  I have reviewed the data as listed  . CBC Latest Ref Rng & Units 08/16/2017 02/09/2017 01/18/2017  WBC 4.0 - 10.3 K/uL 16.9(H) 19.6(H) 20.4(H)  Hemoglobin 13.0 - 17.1 g/dL 13.2 12.9(L) 14.0  Hematocrit 38.4 - 49.9 % 39.3 38.6 40.4  Platelets 140 - 400 K/uL 284 290 302   . CBC    Component Value Date/Time   WBC 16.9 (H) 08/16/2017 1405   WBC 19.6 (H) 02/09/2017 1302   WBC 20.4 (H) 01/18/2017 1414   RBC 4.11 (L) 08/16/2017 1405   RBC 4.12 (L) 08/16/2017 1405   HGB 13.2 08/16/2017 1405   HGB 12.9 (L) 02/09/2017 1302   HCT 39.3 08/16/2017 1405   HCT 38.6 02/09/2017 1302   PLT 284  08/16/2017 1405   PLT 290 02/09/2017 1302   MCV 95.6 08/16/2017 1405   MCV 97.0 02/09/2017 1302   MCH 32.0 08/16/2017 1405   MCHC 33.5 08/16/2017 1405   RDW 13.0 08/16/2017 1405   RDW 12.7 02/09/2017 1302   LYMPHSABS 11.4 (H) 08/16/2017 1405   LYMPHSABS 12.9 (H) 02/09/2017 1302   MONOABS 0.7 08/16/2017 1405   MONOABS 0.7 02/09/2017 1302   EOSABS 0.2 08/16/2017 1405  EOSABS 0.2 02/09/2017 1302   BASOSABS 0.1 08/16/2017 1405   BASOSABS 0.0 02/09/2017 1302     CMP Latest Ref Rng & Units 08/16/2017 02/09/2017 01/18/2017  Glucose 70 - 140 mg/dL 81 101 133(H)  BUN 7 - 26 mg/dL 21 23.9 23(H)  Creatinine 0.70 - 1.30 mg/dL 1.18 1.1 1.02  Sodium 136 - 145 mmol/L 138 137 133(L)  Potassium 3.5 - 5.1 mmol/L 4.8 5.0 5.0  Chloride 98 - 109 mmol/L 102 - 97(L)  CO2 22 - 29 mmol/L 29 27 24   Calcium 8.4 - 10.4 mg/dL 9.7 9.3 9.2  Total Protein 6.4 - 8.3 g/dL 6.9 6.7 -  Total Bilirubin 0.2 - 1.2 mg/dL 0.3 0.39 -  Alkaline Phos 40 - 150 U/L 58 54 -  AST 5 - 34 U/L 44(H) 32 -  ALT 0 - 55 U/L 35 30 -  . Lab Results  Component Value Date   LDH 211 08/16/2017         ASSESSMENT & PLAN:   [name] is a very pleasant 67 year old Caucasian gentleman with  1) Rai Stage 0 CLL with lymphocytosis with no overt LNadenopathy or palpable hepatosplenomegaly. On presentation Absolute lymphocyte count of 8.8k with about 79 percent clonal lymphocytes on flow cytometry consistent with CLL. nl Hgb and platelets.  Today (02/09/2017) on the labs, the patient's hbg is at 12.9, plt improved to 290k and WBC count of 19.6 with absolute lymphocyte count at 12.9.   CLL prognostic FISH panel neg for commonly tested mutations. No constitutional symptoms. No new lymphadenopathy. Plan -Discussed pt labwork today 08/16/17; Hgb improved to 13.2, and WBC improved to 16.9k, Lymphs at 11.4k. Blood counts are overall stable.  -Discussed symptoms and clinical rationale for beginning treatment with pt, which he does not  currently meet.  -I discussed with the pt that since his CLL has been very stable over the last 3 visits, I would be happy to alternate labs and visits with his PCP whom he sees every 6 months with labs.  -Pt will let me know if he develops any new or concerning symptoms. -The pt shows no clinical or lab progression of CLL at this time.  -No indication for treatment at this time.    2)Constipation. Being mx by his PCP. On laxatives and Linzess to address this. Patient notes that he has been working with Dr. Collene Mares on his laxative regimen and that his constipation is much improved . He had a CT of the abdomen and pelvis on 06/13/2015  which showed no evidence of obstructing lesion. Moderate fecal burden throughout the colon. No lymphadenopathy in the abdomen or pelvis.  Colonoscopy was unrevealing for any concerning findings as per patient.   RTC with Dr Irene Limbo in 12 months with labs F/u with PCP IN 6 months with labs  . Orders Placed This Encounter  Procedures  . CBC & Diff and Retic    Standing Status:   Future    Standing Expiration Date:   08/17/2018  . CMP ([place] only)    Standing Status:   Future    Standing Expiration Date:   08/17/2018  . Lactate dehydrogenase    Standing Status:   Future    Standing Expiration Date:   08/17/2018     All of the patients questions were answered to his apparent satisfaction. The patient knows to call the clinic with any problems, questions or concerns.  . The total time spent in the appointment was 15 minutes and more  than 50% was on counseling and direct patient cares.     Sullivan Lone MD [place] AAHIVMS [facility] Hematology/Oncology Physician [place]  (Office):       [phone] (Work cell):  [phone] (Fax):           [phone]  This document serves as a record of services personally performed by Sullivan Lone, MD. It was created on his behalf by Baldwin Jamaica, a trained medical scribe. The creation of this record is  based on the scribe's personal observations and the provider's statements to them.   .I have reviewed the above documentation for accuracy and completeness, and I agree with the above. Brunetta Genera MD MS

## 2017-08-16 ENCOUNTER — Inpatient Hospital Stay: Payer: Medicare Other

## 2017-08-16 ENCOUNTER — Encounter: Payer: Self-pay | Admitting: Hematology

## 2017-08-16 ENCOUNTER — Telehealth: Payer: Self-pay | Admitting: Hematology

## 2017-08-16 ENCOUNTER — Inpatient Hospital Stay: Payer: Medicare Other | Attending: Hematology | Admitting: Hematology

## 2017-08-16 VITALS — BP 126/61 | HR 61 | Temp 98.1°F | Resp 17 | Ht 70.0 in | Wt 188.2 lb

## 2017-08-16 DIAGNOSIS — C9110 Chronic lymphocytic leukemia of B-cell type not having achieved remission: Secondary | ICD-10-CM | POA: Diagnosis not present

## 2017-08-16 DIAGNOSIS — K5900 Constipation, unspecified: Secondary | ICD-10-CM | POA: Insufficient documentation

## 2017-08-16 LAB — CBC WITH DIFFERENTIAL (CANCER CENTER ONLY)
Basophils Absolute: 0.1 10*3/uL (ref 0.0–0.1)
Basophils Relative: 1 %
Eosinophils Absolute: 0.2 10*3/uL (ref 0.0–0.5)
Eosinophils Relative: 1 %
HCT: 39.3 % (ref 38.4–49.9)
Hemoglobin: 13.2 g/dL (ref 13.0–17.1)
Lymphocytes Relative: 67 %
Lymphs Abs: 11.4 10*3/uL — ABNORMAL HIGH (ref 0.9–3.3)
MCH: 32 pg (ref 27.2–33.4)
MCHC: 33.5 g/dL (ref 32.0–36.0)
MCV: 95.6 fL (ref 79.3–98.0)
Monocytes Absolute: 0.7 10*3/uL (ref 0.1–0.9)
Monocytes Relative: 4 %
Neutro Abs: 4.5 10*3/uL (ref 1.5–6.5)
Neutrophils Relative %: 27 %
Platelet Count: 284 10*3/uL (ref 140–400)
RBC: 4.11 MIL/uL — ABNORMAL LOW (ref 4.20–5.82)
RDW: 13 % (ref 11.0–14.6)
WBC Count: 16.9 10*3/uL — ABNORMAL HIGH (ref 4.0–10.3)

## 2017-08-16 LAB — COMPREHENSIVE METABOLIC PANEL WITH GFR
ALT: 35 U/L (ref 0–55)
AST: 44 U/L — ABNORMAL HIGH (ref 5–34)
Albumin: 4.2 g/dL (ref 3.5–5.0)
Alkaline Phosphatase: 58 U/L (ref 40–150)
Anion gap: 7 (ref 3–11)
BUN: 21 mg/dL (ref 7–26)
CO2: 29 mmol/L (ref 22–29)
Calcium: 9.7 mg/dL (ref 8.4–10.4)
Chloride: 102 mmol/L (ref 98–109)
Creatinine, Ser: 1.18 mg/dL (ref 0.70–1.30)
GFR calc Af Amer: >60 mL/min
GFR calc non Af Amer: >60 mL/min
Glucose, Bld: 81 mg/dL (ref 70–140)
Potassium: 4.8 mmol/L (ref 3.5–5.1)
Sodium: 138 mmol/L (ref 136–145)
Total Bilirubin: 0.3 mg/dL (ref 0.2–1.2)
Total Protein: 6.9 g/dL (ref 6.4–8.3)

## 2017-08-16 LAB — RETICULOCYTES
RBC.: 4.12 MIL/uL — ABNORMAL LOW (ref 4.20–5.82)
RETIC COUNT ABSOLUTE: 45.3 10*3/uL (ref 34.8–93.9)
Retic Ct Pct: 1.1 % (ref 0.8–1.8)

## 2017-08-16 LAB — LACTATE DEHYDROGENASE: LDH: 211 U/L (ref 125–245)

## 2017-08-16 NOTE — Telephone Encounter (Signed)
Appointments scheduled AVS/Calendar printed per 4/29 los °

## 2017-10-06 IMAGING — CT CT HEAD W/O CM
4 series · 16 of 47 positions shown, 18 images · non-contrast
Comparison: 08/16/2015

CLINICAL DATA: Vertigo, blurry vision

EXAM:
CT HEAD WITHOUT CONTRAST
TECHNIQUE: Contiguous axial images were obtained from the base of the skull
through the vertex without intravenous contrast.

[Series 3: head without · axial · non-contrast · 0.51mm/px · z∈[-190,-55]mm · 7 of 37 slices shown, 9 images]
[im 5/37  brain]
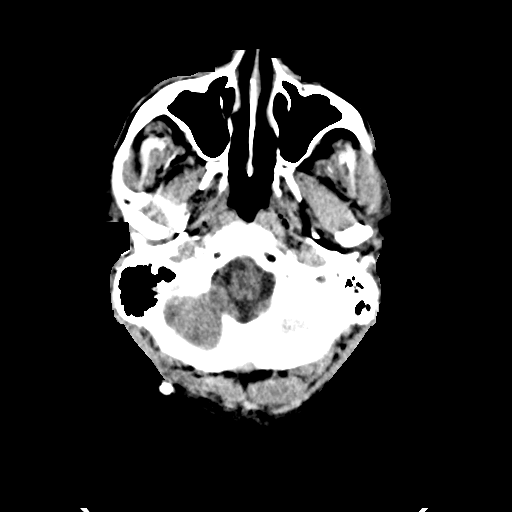
[im 5/37  bone]
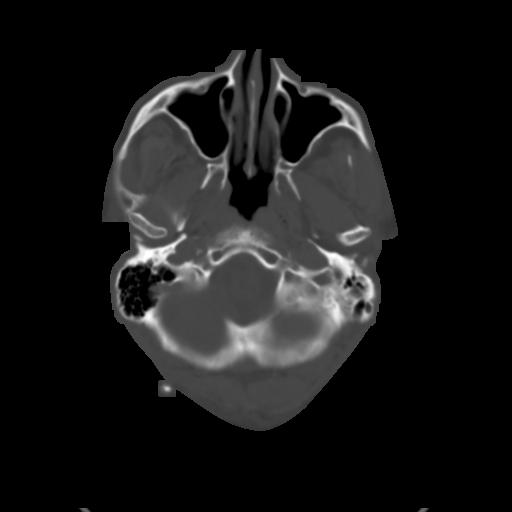
[im 10/37  brain]
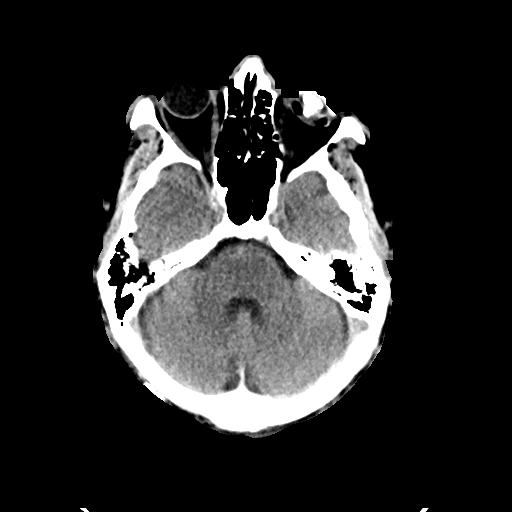
[im 14/37  brain]
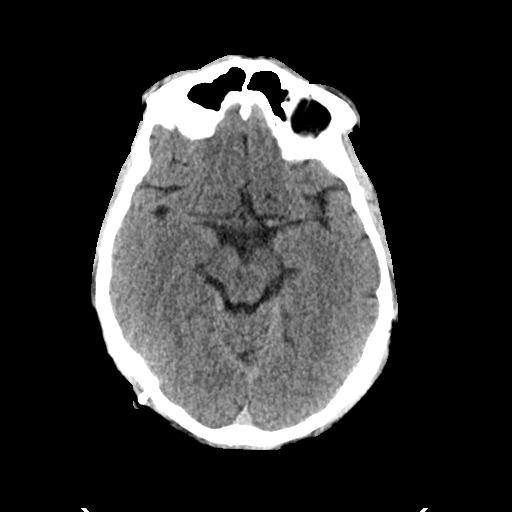
[im 19/37  brain]
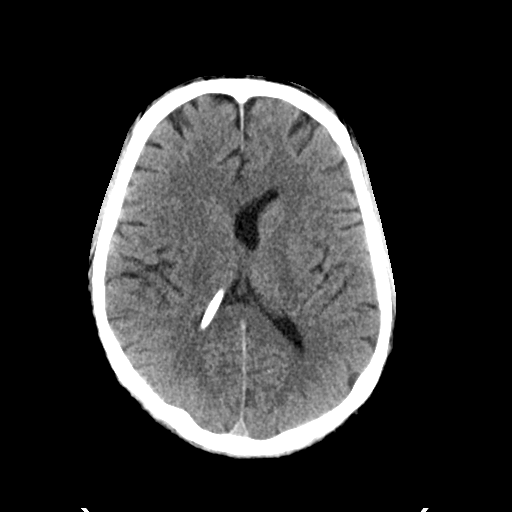
[im 23/37  brain]
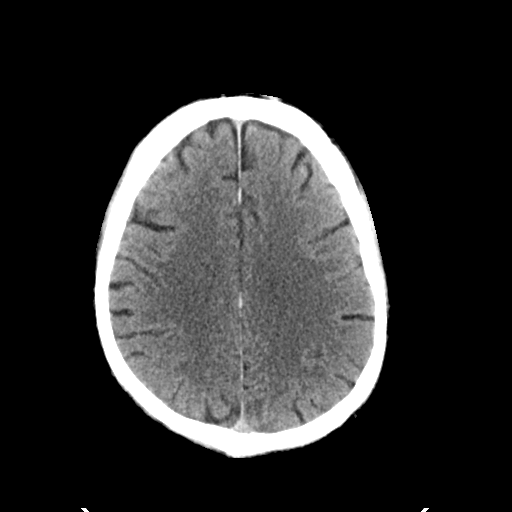
[im 23/37  bone]
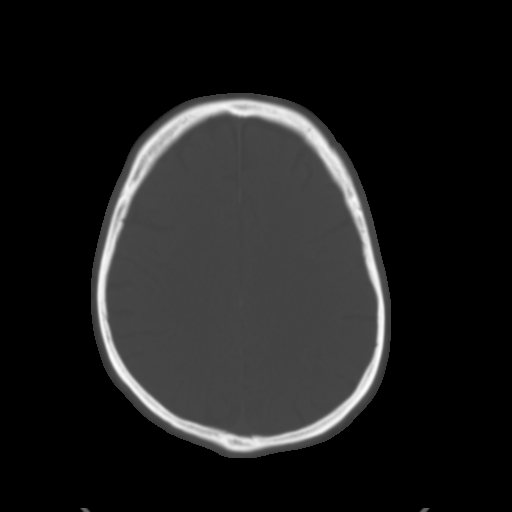
[im 28/37  brain]
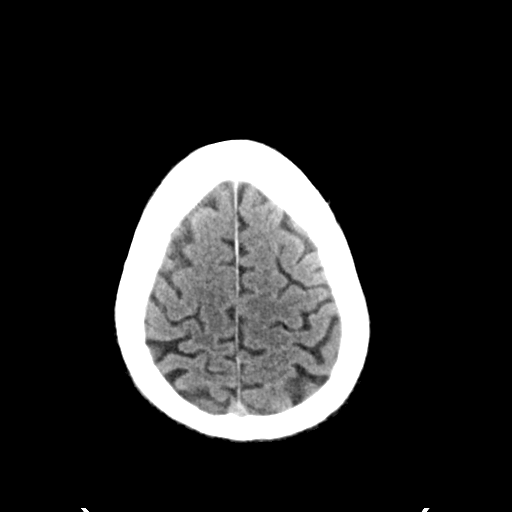
[im 32/37  brain]
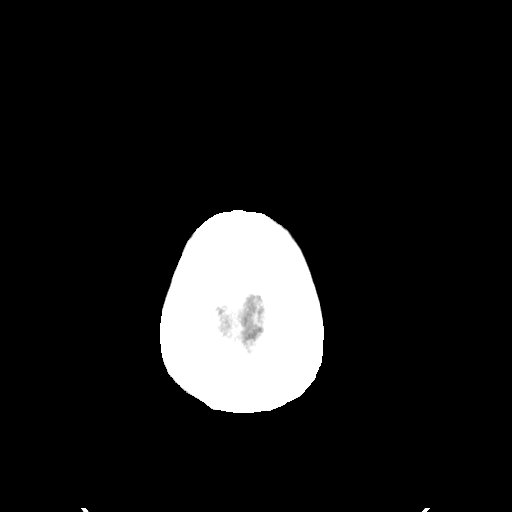

[Series 4: head bone · axial · 0.51mm/px · z∈[-192,-156]mm · 3 of 91 slices shown]
[im 10/91  bone]
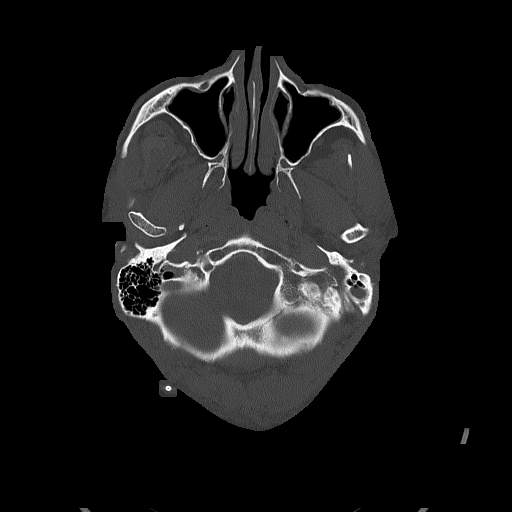
[im 19/91  bone]
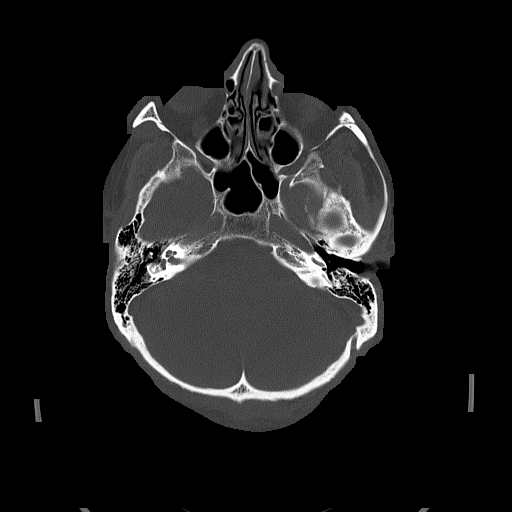
[im 28/91  bone]
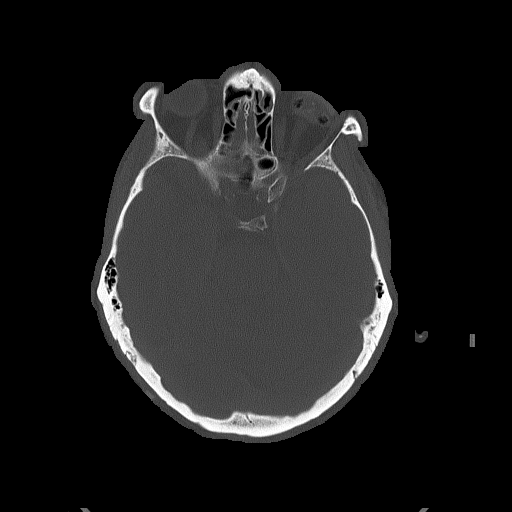

[Series 5: head without cor · coronal · non-contrast · 0.35mm/px · 3 of 78 slices shown]
[im 26/78  brain]
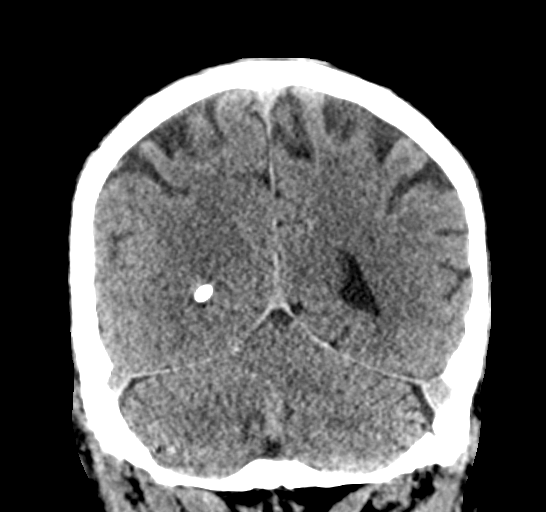
[im 35/78  brain]
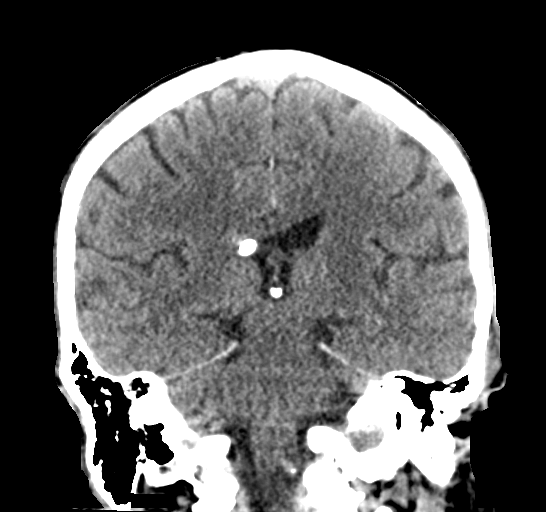
[im 43/78  brain]
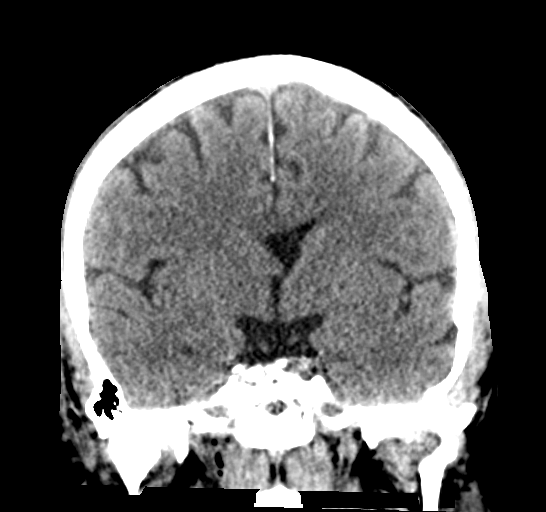

[Series 6: head without sag · sagittal · non-contrast · 0.35mm/px · 3 of 67 slices shown]
[im 23/67  brain]
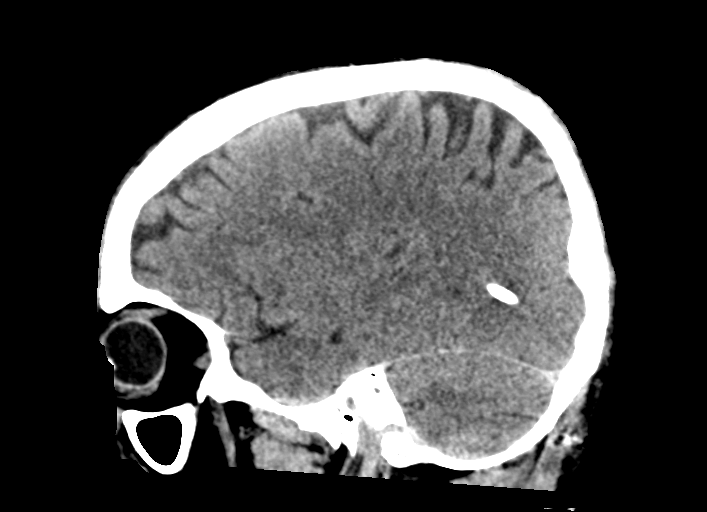
[im 34/67  brain]
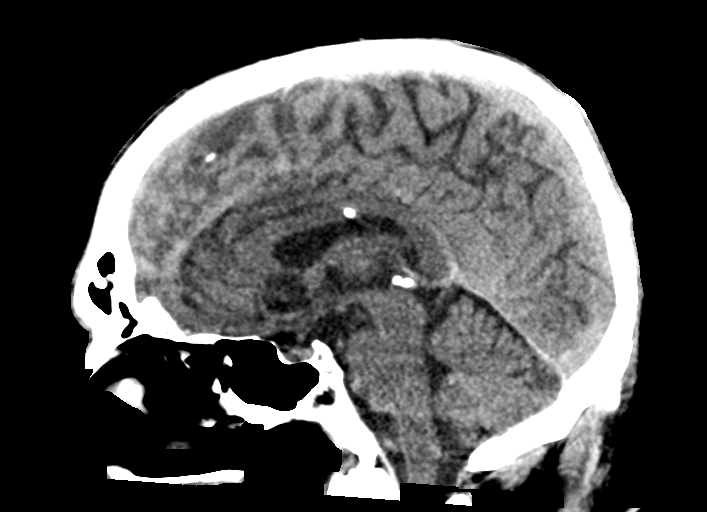
[im 45/67  brain]
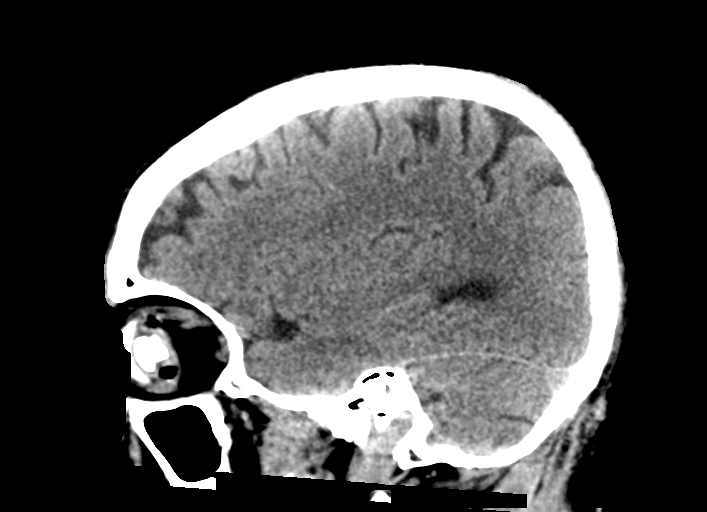

[16 of 47 positions shown; findings below may reference images not displayed]

FINDINGS: Brain: Ventriculostomy catheter is again noted on right. The right
lateral ventricle is predominately decompressed. The left lateral
ventricle appears within normal limits. No findings to suggest acute
hemorrhage, acute infarction or space-occupying mass lesion are
noted.

Vascular: No hyperdense vessel or unexpected calcification.

Skull: Normal. Negative for fracture or focal lesion.

Sinuses/Orbits: Stable changes in the orbits bilaterally.

Other: None
IMPRESSION: Chronic changes without acute abnormality.

Ventriculostomy shunt is again identified and stable.

## 2017-11-01 DIAGNOSIS — L821 Other seborrheic keratosis: Secondary | ICD-10-CM | POA: Diagnosis not present

## 2017-11-01 DIAGNOSIS — D225 Melanocytic nevi of trunk: Secondary | ICD-10-CM | POA: Diagnosis not present

## 2017-11-01 DIAGNOSIS — L570 Actinic keratosis: Secondary | ICD-10-CM | POA: Diagnosis not present

## 2017-11-01 DIAGNOSIS — L814 Other melanin hyperpigmentation: Secondary | ICD-10-CM | POA: Diagnosis not present

## 2017-11-01 DIAGNOSIS — Z872 Personal history of diseases of the skin and subcutaneous tissue: Secondary | ICD-10-CM | POA: Diagnosis not present

## 2017-11-01 DIAGNOSIS — L578 Other skin changes due to chronic exposure to nonionizing radiation: Secondary | ICD-10-CM | POA: Diagnosis not present

## 2018-01-07 DIAGNOSIS — E7800 Pure hypercholesterolemia, unspecified: Secondary | ICD-10-CM | POA: Diagnosis not present

## 2018-01-07 DIAGNOSIS — G912 (Idiopathic) normal pressure hydrocephalus: Secondary | ICD-10-CM | POA: Diagnosis not present

## 2018-01-07 DIAGNOSIS — I10 Essential (primary) hypertension: Secondary | ICD-10-CM | POA: Diagnosis not present

## 2018-01-07 DIAGNOSIS — Z23 Encounter for immunization: Secondary | ICD-10-CM | POA: Diagnosis not present

## 2018-01-07 DIAGNOSIS — Z125 Encounter for screening for malignant neoplasm of prostate: Secondary | ICD-10-CM | POA: Diagnosis not present

## 2018-01-07 DIAGNOSIS — Z0000 Encounter for general adult medical examination without abnormal findings: Secondary | ICD-10-CM | POA: Diagnosis not present

## 2018-01-12 DIAGNOSIS — E7800 Pure hypercholesterolemia, unspecified: Secondary | ICD-10-CM | POA: Diagnosis not present

## 2018-01-12 DIAGNOSIS — Z9641 Presence of insulin pump (external) (internal): Secondary | ICD-10-CM | POA: Diagnosis not present

## 2018-01-12 DIAGNOSIS — Z982 Presence of cerebrospinal fluid drainage device: Secondary | ICD-10-CM | POA: Diagnosis not present

## 2018-01-12 DIAGNOSIS — R972 Elevated prostate specific antigen [PSA]: Secondary | ICD-10-CM | POA: Diagnosis not present

## 2018-01-12 DIAGNOSIS — E1021 Type 1 diabetes mellitus with diabetic nephropathy: Secondary | ICD-10-CM | POA: Diagnosis not present

## 2018-01-12 DIAGNOSIS — I700 Atherosclerosis of aorta: Secondary | ICD-10-CM | POA: Diagnosis not present

## 2018-01-12 DIAGNOSIS — G912 (Idiopathic) normal pressure hydrocephalus: Secondary | ICD-10-CM | POA: Diagnosis not present

## 2018-01-12 DIAGNOSIS — I10 Essential (primary) hypertension: Secondary | ICD-10-CM | POA: Diagnosis not present

## 2018-01-12 DIAGNOSIS — C9190 Lymphoid leukemia, unspecified not having achieved remission: Secondary | ICD-10-CM | POA: Diagnosis not present

## 2018-02-01 DIAGNOSIS — N401 Enlarged prostate with lower urinary tract symptoms: Secondary | ICD-10-CM | POA: Diagnosis not present

## 2018-02-01 DIAGNOSIS — R972 Elevated prostate specific antigen [PSA]: Secondary | ICD-10-CM | POA: Diagnosis not present

## 2018-02-01 DIAGNOSIS — R350 Frequency of micturition: Secondary | ICD-10-CM | POA: Diagnosis not present

## 2018-02-09 DIAGNOSIS — E1021 Type 1 diabetes mellitus with diabetic nephropathy: Secondary | ICD-10-CM | POA: Diagnosis not present

## 2018-02-21 DIAGNOSIS — L578 Other skin changes due to chronic exposure to nonionizing radiation: Secondary | ICD-10-CM | POA: Diagnosis not present

## 2018-02-21 DIAGNOSIS — L570 Actinic keratosis: Secondary | ICD-10-CM | POA: Diagnosis not present

## 2018-03-03 DIAGNOSIS — Z1211 Encounter for screening for malignant neoplasm of colon: Secondary | ICD-10-CM | POA: Diagnosis not present

## 2018-03-03 DIAGNOSIS — K5900 Constipation, unspecified: Secondary | ICD-10-CM | POA: Diagnosis not present

## 2018-03-10 DIAGNOSIS — R351 Nocturia: Secondary | ICD-10-CM | POA: Diagnosis not present

## 2018-03-10 DIAGNOSIS — N401 Enlarged prostate with lower urinary tract symptoms: Secondary | ICD-10-CM | POA: Diagnosis not present

## 2018-03-10 DIAGNOSIS — R3914 Feeling of incomplete bladder emptying: Secondary | ICD-10-CM | POA: Diagnosis not present

## 2018-03-21 DIAGNOSIS — Z1211 Encounter for screening for malignant neoplasm of colon: Secondary | ICD-10-CM | POA: Diagnosis not present

## 2018-03-21 DIAGNOSIS — K5900 Constipation, unspecified: Secondary | ICD-10-CM | POA: Diagnosis not present

## 2018-04-28 DIAGNOSIS — R972 Elevated prostate specific antigen [PSA]: Secondary | ICD-10-CM | POA: Diagnosis not present

## 2018-05-03 DIAGNOSIS — L570 Actinic keratosis: Secondary | ICD-10-CM | POA: Diagnosis not present

## 2018-05-05 DIAGNOSIS — N401 Enlarged prostate with lower urinary tract symptoms: Secondary | ICD-10-CM | POA: Diagnosis not present

## 2018-05-05 DIAGNOSIS — R3914 Feeling of incomplete bladder emptying: Secondary | ICD-10-CM | POA: Diagnosis not present

## 2018-05-05 DIAGNOSIS — R972 Elevated prostate specific antigen [PSA]: Secondary | ICD-10-CM | POA: Diagnosis not present

## 2018-05-05 DIAGNOSIS — R351 Nocturia: Secondary | ICD-10-CM | POA: Diagnosis not present

## 2018-05-24 DIAGNOSIS — R351 Nocturia: Secondary | ICD-10-CM | POA: Diagnosis not present

## 2018-05-24 DIAGNOSIS — R3915 Urgency of urination: Secondary | ICD-10-CM | POA: Diagnosis not present

## 2018-05-24 DIAGNOSIS — N401 Enlarged prostate with lower urinary tract symptoms: Secondary | ICD-10-CM | POA: Diagnosis not present

## 2018-05-24 DIAGNOSIS — R3914 Feeling of incomplete bladder emptying: Secondary | ICD-10-CM | POA: Diagnosis not present

## 2018-05-27 DIAGNOSIS — E103593 Type 1 diabetes mellitus with proliferative diabetic retinopathy without macular edema, bilateral: Secondary | ICD-10-CM | POA: Diagnosis not present

## 2018-05-27 DIAGNOSIS — Z961 Presence of intraocular lens: Secondary | ICD-10-CM | POA: Diagnosis not present

## 2018-06-07 DIAGNOSIS — L570 Actinic keratosis: Secondary | ICD-10-CM | POA: Diagnosis not present

## 2018-06-16 DIAGNOSIS — N401 Enlarged prostate with lower urinary tract symptoms: Secondary | ICD-10-CM | POA: Diagnosis not present

## 2018-06-16 DIAGNOSIS — R3914 Feeling of incomplete bladder emptying: Secondary | ICD-10-CM | POA: Diagnosis not present

## 2018-06-22 DIAGNOSIS — E1021 Type 1 diabetes mellitus with diabetic nephropathy: Secondary | ICD-10-CM | POA: Diagnosis not present

## 2018-06-22 DIAGNOSIS — I10 Essential (primary) hypertension: Secondary | ICD-10-CM | POA: Diagnosis not present

## 2018-06-22 DIAGNOSIS — I700 Atherosclerosis of aorta: Secondary | ICD-10-CM | POA: Diagnosis not present

## 2018-06-22 DIAGNOSIS — E7800 Pure hypercholesterolemia, unspecified: Secondary | ICD-10-CM | POA: Diagnosis not present

## 2018-06-29 DIAGNOSIS — E1021 Type 1 diabetes mellitus with diabetic nephropathy: Secondary | ICD-10-CM | POA: Diagnosis not present

## 2018-06-29 DIAGNOSIS — E7800 Pure hypercholesterolemia, unspecified: Secondary | ICD-10-CM | POA: Diagnosis not present

## 2018-06-29 DIAGNOSIS — I10 Essential (primary) hypertension: Secondary | ICD-10-CM | POA: Diagnosis not present

## 2018-06-29 DIAGNOSIS — R972 Elevated prostate specific antigen [PSA]: Secondary | ICD-10-CM | POA: Diagnosis not present

## 2018-07-11 DIAGNOSIS — R3915 Urgency of urination: Secondary | ICD-10-CM | POA: Diagnosis not present

## 2018-07-11 DIAGNOSIS — N401 Enlarged prostate with lower urinary tract symptoms: Secondary | ICD-10-CM | POA: Diagnosis not present

## 2018-07-13 DIAGNOSIS — R3914 Feeling of incomplete bladder emptying: Secondary | ICD-10-CM | POA: Diagnosis not present

## 2018-07-13 DIAGNOSIS — N401 Enlarged prostate with lower urinary tract symptoms: Secondary | ICD-10-CM | POA: Diagnosis not present

## 2018-07-27 DIAGNOSIS — R351 Nocturia: Secondary | ICD-10-CM | POA: Diagnosis not present

## 2018-07-27 DIAGNOSIS — N401 Enlarged prostate with lower urinary tract symptoms: Secondary | ICD-10-CM | POA: Diagnosis not present

## 2018-07-27 DIAGNOSIS — R3914 Feeling of incomplete bladder emptying: Secondary | ICD-10-CM | POA: Diagnosis not present

## 2018-07-27 DIAGNOSIS — R3915 Urgency of urination: Secondary | ICD-10-CM | POA: Diagnosis not present

## 2018-08-11 DIAGNOSIS — E1021 Type 1 diabetes mellitus with diabetic nephropathy: Secondary | ICD-10-CM | POA: Diagnosis not present

## 2018-08-11 DIAGNOSIS — Z9641 Presence of insulin pump (external) (internal): Secondary | ICD-10-CM | POA: Diagnosis not present

## 2018-08-16 NOTE — Progress Notes (Signed)
HEMATOLOGY/ONCOLOGY CLINIC NOTE  Date of Service: 08/17/18  Patient Care Team:  Dr. Merrilee Seashore as PCP  CHIEF COMPLAINTS:  Follow up for CLL  HISTORY OF PRESENTING ILLNESS: plz see initial consultation for details on initial presentation  INTERVAL HISTORY  [name] is here for his scheduled followup for CLL. The patient's last visit with [place] was on 08/16/17. The pt reports that he is doing well overall.  The pt reports that he retired from work in the interim and has been enjoying his retirement very much. The pt denies any new concerns at this time. The pt notes that he has been sleeping well and endorses good energy levels. He notes that he has been moving his bowels much better. He denies fevers, chills, drenching night sweats, or unexpected weight loss.   The pt notes that his PSA was high in the last year and has been following with urology, and obtained a uro-lift procedure which he notes has been successful. He is emptying his bladder more completely and has been able to sleep through the night as a result.  Lab results today (08/17/18) of CBC w/diff Reticulocytes and CMP is as follows: all values are WNL except for WBC at 20.2k, RBC at 4.07, Lymphs abs at 13.7k, Glucose at 123, BUN at 27. 08/17/18 LDH is . Lab Results  Component Value Date   LDH 164 08/17/2018   On review of systems, pt reports good energy levels, sleeping well, moving his bowels well, and denies fevers, chills, night sweats, unexpected weight loss, new lumps or bumps, and any other symptoms.    MEDICAL HISTORY:  Past Medical History:  Diagnosis Date  . Chronic lymphocytic leukemia ([place])   . CLL (chronic lymphocytic leukemia) ([place])   . Diabetes mellitus without complication ([place])   . GERD (gastroesophageal reflux disease)   . High cholesterol   . Hypertension   . Hyponatremia   . Normal pressure hydrocephalus     SURGICAL HISTORY: Past Surgical History:  Procedure Laterality Date  .  APPENDECTOMY    . BACK SURGERY    . CATARACT EXTRACTION     left eye  . CYST REMOVAL NECK    . LUMBAR FUSION    . VENTRICULOPERITONEAL SHUNT      SOCIAL HISTORY: Social History   Socioeconomic History  . Marital status: Married    Spouse name: Not on file  . Number of children: Not on file  . Years of education: Not on file  . Highest education level: Not on file  Occupational History  . 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 Financial resource strain: Not on file  . Food insecurity:    Worry: Not on file    Inability: Not on file  . Transportation needs:    Medical: Not on file    Non-medical: Not on file  Tobacco Use  . Smoking status: Never Smoker  . Smokeless tobacco: Never Used  Substance and Sexual Activity  . Alcohol use: No  . Drug use: No  . Sexual activity: Not on file  Lifestyle  . Physical activity:    Days per week: Not on file    Minutes per session: Not on file  . Stress: Not on file  Relationships  . Social connections:    Talks on phone: Not on file    Gets together: Not on file    Attends religious service: Not on file  Active member of club or organization: Not on file    Attends meetings of clubs or organizations: Not on file    Relationship status: Not on file  . Intimate partner violence:    Fear of current or ex partner: Not on file    Emotionally abused: Not on file    Physically abused: Not on file    Forced sexual activity: Not on file  Other Topics Concern  . Not on file  Social History Narrative  . Not on file    FAMILY HISTORY: Family History  Problem Relation Age of Onset  . Hypertension Mother   . Hyperlipidemia Mother   . Diabetes Mother   . Diabetes Father     ALLERGIES:  is allergic to latex.  MEDICATIONS:  Current Outpatient Medications  Medication Sig Dispense Refill  . amLODipine (NORVASC) 10 MG tablet TK 1 T PO QD  2  . aspirin 81 MG tablet Take 81  mg by mouth daily.    [name] atorvastatin (LIPITOR) 40 MG tablet Take 40 mg by mouth daily.    [name] lisinopril-hydrochlorothiazide (PRINZIDE,ZESTORETIC) 20-12.5 MG tablet Take 1 tablet by mouth daily.     . meclizine (ANTIVERT) 25 MG tablet Take 1 tablet (25 mg total) by mouth 3 (three) times daily as needed for dizziness. 30 tablet 0  . NOVOLOG 100 UNIT/ML injection INJECT UP TO 100 UNITS DAILY MAXIMUM DOSE WITH INSULIN PUMP DAILY  1  . ondansetron (ZOFRAN ODT) 8 MG disintegrating tablet 8mg  ODT q6 hours prn nausea 8 tablet 0  . Polyvinyl Alcohol-Povidone (REFRESH OP) Apply 1-2 drops to eye every 3 (three) hours as needed (dryness).     No current facility-administered medications for this visit.     REVIEW OF SYSTEMS:    A 10+ POINT REVIEW OF SYSTEMS WAS OBTAINED including neurology, dermatology, psychiatry, cardiac, respiratory, lymph, extremities, GI, GU, Musculoskeletal, constitutional, breasts, reproductive, HEENT.  All pertinent positives are noted in the HPI.  All others are negative.   PHYSICAL EXAMINATION: ECOG PERFORMANCE STATUS: 1 - Symptomatic but completely ambulatory  Vitals:   08/17/18 1357  BP: 129/65  Pulse: 62  Resp: 18  Temp: 97.7 F (36.5 C)  SpO2: 100%   Filed Weights   08/17/18 1357  Weight: 181 lb 8 oz (82.3 kg)   .Body mass index is 26.04 kg/m.  GENERAL:alert, in no acute distress and comfortable SKIN: no acute rashes, no significant lesions EYES: conjunctiva are pink and non-injected, sclera anicteric OROPHARYNX: MMM, no exudates, no oropharyngeal erythema or ulceration NECK: supple, no JVD LYMPH:  no palpable lymphadenopathy in the cervical, axillary or inguinal regions LUNGS: clear to auscultation b/l with normal respiratory effort HEART: regular rate & rhythm ABDOMEN:  normoactive bowel sounds , non tender, not distended. No palpable hepatosplenomegaly.  Extremity: no pedal edema PSYCH: alert & oriented x 3 with fluent speech NEURO: no focal  motor/sensory deficits   LABORATORY DATA:  I have reviewed the data as listed  . CBC Latest Ref Rng & Units 08/17/2018 08/16/2017 02/09/2017  WBC 4.0 - 10.5 K/uL 20.2(H) 16.9(H) 19.6(H)  Hemoglobin 13.0 - 17.0 g/dL 13.1 13.2 12.9(L)  Hematocrit 39.0 - 52.0 % 39.6 39.3 38.6  Platelets 150 - 400 K/uL 267 284 290   . CBC    Component Value Date/Time   WBC 20.2 (H) 08/17/2018 1349   RBC 4.07 (L) 08/17/2018 1349   RBC 4.07 (L) 08/17/2018 1349   HGB 13.1 08/17/2018 1349   HGB 13.2 08/16/2017 1405  HGB 12.9 (L) 02/09/2017 1302   HCT 39.6 08/17/2018 1349   HCT 38.6 02/09/2017 1302   PLT 267 08/17/2018 1349   PLT 284 08/16/2017 1405   PLT 290 02/09/2017 1302   MCV 97.3 08/17/2018 1349   MCV 97.0 02/09/2017 1302   MCH 32.2 08/17/2018 1349   MCHC 33.1 08/17/2018 1349   RDW 12.3 08/17/2018 1349   RDW 12.7 02/09/2017 1302   LYMPHSABS 13.7 (H) 08/17/2018 1349   LYMPHSABS 12.9 (H) 02/09/2017 1302   MONOABS 0.9 08/17/2018 1349   MONOABS 0.7 02/09/2017 1302   EOSABS 0.3 08/17/2018 1349   EOSABS 0.2 02/09/2017 1302   BASOSABS 0.1 08/17/2018 1349   BASOSABS 0.0 02/09/2017 1302     CMP Latest Ref Rng & Units 08/17/2018 08/16/2017 02/09/2017  Glucose 70 - 99 mg/dL 123(H) 81 101  BUN 8 - 23 mg/dL 27(H) 21 23.9  Creatinine 0.61 - 1.24 mg/dL 1.08 1.18 1.1  Sodium 135 - 145 mmol/L 138 138 137  Potassium 3.5 - 5.1 mmol/L 4.6 4.8 5.0  Chloride 98 - 111 mmol/L 103 102 -  CO2 22 - 32 mmol/L 28 29 27   Calcium 8.9 - 10.3 mg/dL 9.1 9.7 9.3  Total Protein 6.5 - 8.1 g/dL 7.0 6.9 6.7  Total Bilirubin 0.3 - 1.2 mg/dL 0.3 0.3 0.39  Alkaline Phos 38 - 126 U/L 58 58 54  AST 15 - 41 U/L 25 44(H) 32  ALT 0 - 44 U/L 28 35 30  . Lab Results  Component Value Date   LDH 211 08/16/2017         ASSESSMENT & PLAN:   Mr Solanki is a very pleasant 68 y.o.  Caucasian gentleman with  1) Rai Stage 0 CLL with lymphocytosis with no overt LNadenopathy or palpable hepatosplenomegaly. On presentation  Absolute lymphocyte count of 8.8k with about 79 percent clonal lymphocytes on flow cytometry consistent with CLL. nl Hgb and platelets.  02/09/2017 labs, the patient's hbg is at 12.9, plt improved to 290k and WBC count of 19.6 with absolute lymphocyte count at 12.9.   CLL prognostic FISH panel neg for commonly tested mutations. No constitutional symptoms. No new lymphadenopathy.  PLAN: -Discussed pt labwork today, 08/17/18; WBC at 20.2k increased mildly from 16.9k one year ago. Lymphs abs increased mildly to 13.7k from 11.4k one year ago. No anemia, no thrombocytopenia.  -The pt shows no overt clinical or lab progression of his CLL at this time.  -No indication to initiate treatment at this time.  -Recommend continued watchful observation -Pt is aware of the concerning symptoms to be mindful for and will let me know if he develops any of these in the interim -Discussed criteria indicative of considering initiating treatment including cytopenias, constitutional symptoms, and threat of organ injury -Will be happy to continue alternating labs and visits with his PCP Dr. Merrilee Seashore, whom he sees every 6 months with labs.  -Will see the pt back in one year  2) Constipation. Being mx by his PCP. On laxatives and Linzess to address this. Patient notes that he has been working with Dr. Collene Mares on his laxative regimen and that his constipation is much improved . He had a CT of the abdomen and pelvis on 06/13/2015  which showed no evidence of obstructing lesion. Moderate fecal burden throughout the colon. No lymphadenopathy in the abdomen or pelvis.  Colonoscopy was unrevealing for any concerning findings as per patient.   RTC with Dr Irene Limbo in 12 months with labs F/u with  PCP IN 6 months with labs   Orders Placed This Encounter  Procedures  . CBC with Differential/Platelet    Standing Status:   Future    Standing Expiration Date:   09/21/2019  . CMP ([place] only)    Standing Status:    Future    Standing Expiration Date:   08/17/2019  . Lactate dehydrogenase    Standing Status:   Future    Standing Expiration Date:   08/17/2019     All of the patients questions were answered to his apparent satisfaction. The patient knows to call the clinic with any problems, questions or concerns.  The total time spent in the appt was 20 minutes and more than 50% was on counseling and direct patient cares.    Sullivan Lone MD [place] AAHIVMS [facility] Hematology/Oncology Physician [facility]  (Office):       [phone] (Work cell):  [phone] (Fax):           [phone]  I, Baldwin [place], am acting as a scribe for Dr. Sullivan Lone.   .I have reviewed the above documentation for accuracy and completeness, and I agree with the above. Brunetta Genera MD

## 2018-08-17 ENCOUNTER — Inpatient Hospital Stay (HOSPITAL_BASED_OUTPATIENT_CLINIC_OR_DEPARTMENT_OTHER): Payer: Medicare Other | Admitting: Hematology

## 2018-08-17 ENCOUNTER — Telehealth: Payer: Self-pay | Admitting: Hematology

## 2018-08-17 ENCOUNTER — Other Ambulatory Visit: Payer: Self-pay

## 2018-08-17 ENCOUNTER — Inpatient Hospital Stay: Payer: Medicare Other | Attending: Hematology

## 2018-08-17 VITALS — BP 129/65 | HR 62 | Temp 97.7°F | Resp 18 | Ht 70.0 in | Wt 181.5 lb

## 2018-08-17 DIAGNOSIS — Z79899 Other long term (current) drug therapy: Secondary | ICD-10-CM | POA: Diagnosis not present

## 2018-08-17 DIAGNOSIS — E119 Type 2 diabetes mellitus without complications: Secondary | ICD-10-CM

## 2018-08-17 DIAGNOSIS — Z7982 Long term (current) use of aspirin: Secondary | ICD-10-CM | POA: Insufficient documentation

## 2018-08-17 DIAGNOSIS — K5900 Constipation, unspecified: Secondary | ICD-10-CM

## 2018-08-17 DIAGNOSIS — I10 Essential (primary) hypertension: Secondary | ICD-10-CM | POA: Diagnosis not present

## 2018-08-17 DIAGNOSIS — E7800 Pure hypercholesterolemia, unspecified: Secondary | ICD-10-CM

## 2018-08-17 DIAGNOSIS — C9110 Chronic lymphocytic leukemia of B-cell type not having achieved remission: Secondary | ICD-10-CM | POA: Diagnosis not present

## 2018-08-17 LAB — CBC WITH DIFFERENTIAL/PLATELET
Abs Immature Granulocytes: 0.03 10*3/uL (ref 0.00–0.07)
Basophils Absolute: 0.1 10*3/uL (ref 0.0–0.1)
Basophils Relative: 0 %
Eosinophils Absolute: 0.3 10*3/uL (ref 0.0–0.5)
Eosinophils Relative: 1 %
HCT: 39.6 % (ref 39.0–52.0)
Hemoglobin: 13.1 g/dL (ref 13.0–17.0)
Immature Granulocytes: 0 %
Lymphocytes Relative: 69 %
Lymphs Abs: 13.7 10*3/uL — ABNORMAL HIGH (ref 0.7–4.0)
MCH: 32.2 pg (ref 26.0–34.0)
MCHC: 33.1 g/dL (ref 30.0–36.0)
MCV: 97.3 fL (ref 80.0–100.0)
Monocytes Absolute: 0.9 10*3/uL (ref 0.1–1.0)
Monocytes Relative: 4 %
Neutro Abs: 5.3 10*3/uL (ref 1.7–7.7)
Neutrophils Relative %: 26 %
Platelets: 267 10*3/uL (ref 150–400)
RBC: 4.07 MIL/uL — ABNORMAL LOW (ref 4.22–5.81)
RDW: 12.3 % (ref 11.5–15.5)
WBC: 20.2 10*3/uL — ABNORMAL HIGH (ref 4.0–10.5)
nRBC: 0 % (ref 0.0–0.2)

## 2018-08-17 LAB — CMP (CANCER CENTER ONLY)
ALT: 28 U/L (ref 0–44)
AST: 25 U/L (ref 15–41)
Albumin: 4.1 g/dL (ref 3.5–5.0)
Alkaline Phosphatase: 58 U/L (ref 38–126)
Anion gap: 7 (ref 5–15)
BUN: 27 mg/dL — ABNORMAL HIGH (ref 8–23)
CO2: 28 mmol/L (ref 22–32)
Calcium: 9.1 mg/dL (ref 8.9–10.3)
Chloride: 103 mmol/L (ref 98–111)
Creatinine: 1.08 mg/dL (ref 0.61–1.24)
GFR, Est AFR Am: >60 mL/min (ref >60)
GFR, Estimated: >60 mL/min (ref >60)
Glucose, Bld: 123 mg/dL — ABNORMAL HIGH (ref 70–99)
Potassium: 4.6 mmol/L (ref 3.5–5.1)
Sodium: 138 mmol/L (ref 135–145)
Total Bilirubin: 0.3 mg/dL (ref 0.3–1.2)
Total Protein: 7 g/dL (ref 6.5–8.1)

## 2018-08-17 LAB — RETICULOCYTES
Immature Retic Fract: 6 % (ref 2.3–15.9)
RBC.: 4.07 MIL/uL — ABNORMAL LOW (ref 4.22–5.81)
Retic Count, Absolute: 37.9 10*3/uL (ref 19.0–186.0)
Retic Ct Pct: 0.9 % (ref 0.4–3.1)

## 2018-08-17 LAB — LACTATE DEHYDROGENASE: LDH: 164 U/L (ref 98–192)

## 2018-08-17 NOTE — Telephone Encounter (Signed)
Scheduled appt per 4/29 los. ° °A calendar will be mailed out. °

## 2018-09-15 DIAGNOSIS — R3915 Urgency of urination: Secondary | ICD-10-CM | POA: Diagnosis not present

## 2018-09-15 DIAGNOSIS — N401 Enlarged prostate with lower urinary tract symptoms: Secondary | ICD-10-CM | POA: Diagnosis not present

## 2018-12-06 DIAGNOSIS — L814 Other melanin hyperpigmentation: Secondary | ICD-10-CM | POA: Diagnosis not present

## 2018-12-06 DIAGNOSIS — L821 Other seborrheic keratosis: Secondary | ICD-10-CM | POA: Diagnosis not present

## 2018-12-06 DIAGNOSIS — D1801 Hemangioma of skin and subcutaneous tissue: Secondary | ICD-10-CM | POA: Diagnosis not present

## 2018-12-06 DIAGNOSIS — Z872 Personal history of diseases of the skin and subcutaneous tissue: Secondary | ICD-10-CM | POA: Diagnosis not present

## 2018-12-06 DIAGNOSIS — L570 Actinic keratosis: Secondary | ICD-10-CM | POA: Diagnosis not present

## 2018-12-06 DIAGNOSIS — D225 Melanocytic nevi of trunk: Secondary | ICD-10-CM | POA: Diagnosis not present

## 2019-01-24 DIAGNOSIS — Z7189 Other specified counseling: Secondary | ICD-10-CM | POA: Diagnosis not present

## 2019-01-24 DIAGNOSIS — E1021 Type 1 diabetes mellitus with diabetic nephropathy: Secondary | ICD-10-CM | POA: Diagnosis not present

## 2019-01-24 DIAGNOSIS — Z23 Encounter for immunization: Secondary | ICD-10-CM | POA: Diagnosis not present

## 2019-01-24 DIAGNOSIS — I10 Essential (primary) hypertension: Secondary | ICD-10-CM | POA: Diagnosis not present

## 2019-01-24 DIAGNOSIS — E7800 Pure hypercholesterolemia, unspecified: Secondary | ICD-10-CM | POA: Diagnosis not present

## 2019-01-24 DIAGNOSIS — Z0000 Encounter for general adult medical examination without abnormal findings: Secondary | ICD-10-CM | POA: Diagnosis not present

## 2019-01-27 DIAGNOSIS — I10 Essential (primary) hypertension: Secondary | ICD-10-CM | POA: Diagnosis not present

## 2019-01-27 DIAGNOSIS — I700 Atherosclerosis of aorta: Secondary | ICD-10-CM | POA: Diagnosis not present

## 2019-01-27 DIAGNOSIS — E1021 Type 1 diabetes mellitus with diabetic nephropathy: Secondary | ICD-10-CM | POA: Diagnosis not present

## 2019-01-27 DIAGNOSIS — Z7189 Other specified counseling: Secondary | ICD-10-CM | POA: Diagnosis not present

## 2019-01-27 DIAGNOSIS — Z982 Presence of cerebrospinal fluid drainage device: Secondary | ICD-10-CM | POA: Diagnosis not present

## 2019-01-27 DIAGNOSIS — R29898 Other symptoms and signs involving the musculoskeletal system: Secondary | ICD-10-CM | POA: Diagnosis not present

## 2019-01-27 DIAGNOSIS — G912 (Idiopathic) normal pressure hydrocephalus: Secondary | ICD-10-CM | POA: Diagnosis not present

## 2019-01-27 DIAGNOSIS — E7800 Pure hypercholesterolemia, unspecified: Secondary | ICD-10-CM | POA: Diagnosis not present

## 2019-01-27 DIAGNOSIS — C9190 Lymphoid leukemia, unspecified not having achieved remission: Secondary | ICD-10-CM | POA: Diagnosis not present

## 2019-02-07 DIAGNOSIS — R531 Weakness: Secondary | ICD-10-CM | POA: Diagnosis not present

## 2019-02-07 DIAGNOSIS — R2689 Other abnormalities of gait and mobility: Secondary | ICD-10-CM | POA: Diagnosis not present

## 2019-02-10 DIAGNOSIS — R2689 Other abnormalities of gait and mobility: Secondary | ICD-10-CM | POA: Diagnosis not present

## 2019-02-10 DIAGNOSIS — R531 Weakness: Secondary | ICD-10-CM | POA: Diagnosis not present

## 2019-02-14 DIAGNOSIS — R531 Weakness: Secondary | ICD-10-CM | POA: Diagnosis not present

## 2019-02-14 DIAGNOSIS — R2689 Other abnormalities of gait and mobility: Secondary | ICD-10-CM | POA: Diagnosis not present

## 2019-02-17 DIAGNOSIS — R531 Weakness: Secondary | ICD-10-CM | POA: Diagnosis not present

## 2019-02-17 DIAGNOSIS — R2689 Other abnormalities of gait and mobility: Secondary | ICD-10-CM | POA: Diagnosis not present

## 2019-02-21 DIAGNOSIS — R2689 Other abnormalities of gait and mobility: Secondary | ICD-10-CM | POA: Diagnosis not present

## 2019-02-21 DIAGNOSIS — R531 Weakness: Secondary | ICD-10-CM | POA: Diagnosis not present

## 2019-02-24 DIAGNOSIS — Z6825 Body mass index (BMI) 25.0-25.9, adult: Secondary | ICD-10-CM | POA: Diagnosis not present

## 2019-02-24 DIAGNOSIS — R29818 Other symptoms and signs involving the nervous system: Secondary | ICD-10-CM | POA: Diagnosis not present

## 2019-02-24 DIAGNOSIS — I10 Essential (primary) hypertension: Secondary | ICD-10-CM | POA: Diagnosis not present

## 2019-02-28 DIAGNOSIS — R531 Weakness: Secondary | ICD-10-CM | POA: Diagnosis not present

## 2019-02-28 DIAGNOSIS — R2689 Other abnormalities of gait and mobility: Secondary | ICD-10-CM | POA: Diagnosis not present

## 2019-03-02 DIAGNOSIS — Z9641 Presence of insulin pump (external) (internal): Secondary | ICD-10-CM | POA: Diagnosis not present

## 2019-03-02 DIAGNOSIS — E7800 Pure hypercholesterolemia, unspecified: Secondary | ICD-10-CM | POA: Diagnosis not present

## 2019-03-02 DIAGNOSIS — E1021 Type 1 diabetes mellitus with diabetic nephropathy: Secondary | ICD-10-CM | POA: Diagnosis not present

## 2019-03-02 DIAGNOSIS — I10 Essential (primary) hypertension: Secondary | ICD-10-CM | POA: Diagnosis not present

## 2019-03-03 DIAGNOSIS — R531 Weakness: Secondary | ICD-10-CM | POA: Diagnosis not present

## 2019-03-03 DIAGNOSIS — R2689 Other abnormalities of gait and mobility: Secondary | ICD-10-CM | POA: Diagnosis not present

## 2019-03-07 DIAGNOSIS — R29818 Other symptoms and signs involving the nervous system: Secondary | ICD-10-CM | POA: Diagnosis not present

## 2019-03-07 DIAGNOSIS — R2689 Other abnormalities of gait and mobility: Secondary | ICD-10-CM | POA: Diagnosis not present

## 2019-03-07 DIAGNOSIS — G919 Hydrocephalus, unspecified: Secondary | ICD-10-CM | POA: Diagnosis not present

## 2019-03-07 DIAGNOSIS — R519 Headache, unspecified: Secondary | ICD-10-CM | POA: Diagnosis not present

## 2019-03-07 DIAGNOSIS — R531 Weakness: Secondary | ICD-10-CM | POA: Diagnosis not present

## 2019-03-10 DIAGNOSIS — R2689 Other abnormalities of gait and mobility: Secondary | ICD-10-CM | POA: Diagnosis not present

## 2019-03-10 DIAGNOSIS — N401 Enlarged prostate with lower urinary tract symptoms: Secondary | ICD-10-CM | POA: Diagnosis not present

## 2019-03-10 DIAGNOSIS — R531 Weakness: Secondary | ICD-10-CM | POA: Diagnosis not present

## 2019-03-10 DIAGNOSIS — R3915 Urgency of urination: Secondary | ICD-10-CM | POA: Diagnosis not present

## 2019-03-15 ENCOUNTER — Ambulatory Visit (INDEPENDENT_AMBULATORY_CARE_PROVIDER_SITE_OTHER): Payer: Medicare Other | Admitting: Neurology

## 2019-03-15 ENCOUNTER — Other Ambulatory Visit: Payer: Self-pay

## 2019-03-15 DIAGNOSIS — H5319 Other subjective visual disturbances: Secondary | ICD-10-CM

## 2019-03-15 DIAGNOSIS — R2990 Unspecified symptoms and signs involving the nervous system: Secondary | ICD-10-CM | POA: Diagnosis not present

## 2019-03-15 NOTE — Procedures (Signed)
    History:  Christopher Conner is a 68 year old gentleman with a history of hydrocephalus, status post VP shunt.  He has recently had episodes of altered vision involving the right eye.  The episodes have been repeated a number of times, the episodes are sometimes associated with a sensation of movement.  The patient is being evaluated for these events.  This is a routine EEG.  No skull defects are noted.  Medications include Prinzide, insulin, Norvasc, meclizine, aspirin, and Lipitor.  EEG classification: Normal awake  Description of the recording: The background rhythms of this recording consists of a fairly well modulated medium amplitude alpha rhythm of 8 Hz that is reactive to eye opening and closure. As the record progresses, the patient appears to remain in the waking state throughout the recording. Photic stimulation was performed, resulting in a bilateral and symmetric photic driving response. Hyperventilation was also performed, resulting in a minimal buildup of the background rhythm activities without significant slowing seen. At no time during the recording does there appear to be evidence of spike or spike wave discharges or evidence of focal slowing. EKG monitor shows no evidence of cardiac rhythm abnormalities with a heart rate of 66.  Impression: This is a normal EEG recording in the waking state. No evidence of ictal or interictal discharges are seen.

## 2019-03-21 DIAGNOSIS — R2689 Other abnormalities of gait and mobility: Secondary | ICD-10-CM | POA: Diagnosis not present

## 2019-03-21 DIAGNOSIS — R531 Weakness: Secondary | ICD-10-CM | POA: Diagnosis not present

## 2019-03-22 DIAGNOSIS — R29818 Other symptoms and signs involving the nervous system: Secondary | ICD-10-CM | POA: Diagnosis not present

## 2019-03-22 DIAGNOSIS — G919 Hydrocephalus, unspecified: Secondary | ICD-10-CM | POA: Diagnosis not present

## 2019-06-02 DIAGNOSIS — E103593 Type 1 diabetes mellitus with proliferative diabetic retinopathy without macular edema, bilateral: Secondary | ICD-10-CM | POA: Diagnosis not present

## 2019-06-02 DIAGNOSIS — H52201 Unspecified astigmatism, right eye: Secondary | ICD-10-CM | POA: Diagnosis not present

## 2019-06-02 DIAGNOSIS — Z961 Presence of intraocular lens: Secondary | ICD-10-CM | POA: Diagnosis not present

## 2019-06-03 DIAGNOSIS — Z23 Encounter for immunization: Secondary | ICD-10-CM | POA: Diagnosis not present

## 2019-07-01 DIAGNOSIS — Z23 Encounter for immunization: Secondary | ICD-10-CM | POA: Diagnosis not present

## 2019-08-16 NOTE — Progress Notes (Signed)
HEMATOLOGY/ONCOLOGY CLINIC NOTE  Date of Service: 08/17/19  Patient Care Team:  Dr. Merrilee Seashore as PCP  CHIEF COMPLAINTS:  Follow up for CLL  HISTORY OF PRESENTING ILLNESS: plz see initial consultation for details on initial presentation  INTERVAL HISTORY  Christopher Conner is here for his scheduled followup for CLL. The patient's last visit with [place] was on 08/17/2018. The pt reports that he is doing well overall.  The pt reports he is good. Pt has gotten both doses of COVID19 vaccine. He has had no new concerns medically over the last year. Pt has been active outside but not able to go to the gym. He has had steady energy. When pt goes to sleep he has some night sweats because he doesn't wake up when he gets to warm and when he wakes up he is sweating. If he only sleeping with a sheet he is usually fine though.   Lab results today (08/17/19) of CBC w/diff and CMP is as follows: all values are WNL except for WBC at 20.5K, RBC at 3.95, Hemoglobin at 12.8, Lymphs Abs at 13.2K, BUN at 32 08/17/19 of LDH at 187: WNL  On review of systems, pt denies fever, chills, night sweats, unexpected weight loss, new lumps/bumps, constipation, abdominal pain  and any other symptoms.    MEDICAL HISTORY:  Past Medical History:  Diagnosis Date  . Chronic lymphocytic leukemia ([place])   . CLL (chronic lymphocytic leukemia) ([place])   . Diabetes mellitus without complication ([place])   . GERD (gastroesophageal reflux disease)   . High cholesterol   . Hypertension   . Hyponatremia   . Normal pressure hydrocephalus     SURGICAL HISTORY: Past Surgical History:  Procedure Laterality Date  . APPENDECTOMY    . BACK SURGERY    . CATARACT EXTRACTION     left eye  . CYST REMOVAL NECK    . LUMBAR FUSION    . VENTRICULOPERITONEAL SHUNT      SOCIAL HISTORY: Social History   Socioeconomic History  . Marital status: Married    Spouse name: Not on file  . Number of children: Not on file  . Years of  education: Not on file  . Highest education level: Not on file  Occupational History  . 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 Smoking status: Never Smoker  . Smokeless tobacco: Never Used  Substance and Sexual Activity  . Alcohol use: No  . Drug use: No  . Sexual activity: Not on file  Other Topics Concern  . Not on file  Social History Narrative  . Not on file   Social Determinants of Health   Financial Resource Strain:   . Difficulty of Paying Living Expenses:   Food Insecurity:   . Worried About Charity fundraiser in the Last Year:   . Arboriculturist in the Last Year:   Transportation Needs:   . Film/video editor (Medical):   [name] Lack of Transportation (Non-Medical):   Physical Activity:   . Days of Exercise per Week:   . Minutes of Exercise per Session:   Stress:   . Feeling of Stress :   Social Connections:   . Frequency of Communication with Friends and Family:   . Frequency of Social Gatherings with Friends and Family:   . Attends Religious Services:   . Active Member of Clubs or Organizations:   . Attends Archivist Meetings:   .  Marital Status:   Intimate Partner Violence:   . Fear of Current or Ex-Partner:   . Emotionally Abused:   [name] Physically Abused:   . Sexually Abused:     FAMILY HISTORY: Family History  Problem Relation Age of Onset  . Hypertension Mother   . Hyperlipidemia Mother   . Diabetes Mother   . Diabetes Father     ALLERGIES:  is allergic to latex.  MEDICATIONS:  Current Outpatient Medications  Medication Sig Dispense Refill  . amLODipine (NORVASC) 10 MG tablet TK 1 T PO QD  2  . aspirin 81 MG tablet Take 81 mg by mouth daily.    [name] atorvastatin (LIPITOR) 40 MG tablet Take 40 mg by mouth daily.    [name] lisinopril-hydrochlorothiazide (PRINZIDE,ZESTORETIC) 20-12.5 MG tablet Take 1 tablet by mouth daily.     . meclizine (ANTIVERT) 25 MG tablet Take 1 tablet (25 mg  total) by mouth 3 (three) times daily as needed for dizziness. 30 tablet 0  . NOVOLOG 100 UNIT/ML injection INJECT UP TO 100 UNITS DAILY MAXIMUM DOSE WITH INSULIN PUMP DAILY  1  . ondansetron (ZOFRAN ODT) 8 MG disintegrating tablet 8mg  ODT q6 hours prn nausea 8 tablet 0  . Polyvinyl Alcohol-Povidone (REFRESH OP) Apply 1-2 drops to eye every 3 (three) hours as needed (dryness).     No current facility-administered medications for this visit.    REVIEW OF SYSTEMS:   A 10+ POINT REVIEW OF SYSTEMS WAS OBTAINED including neurology, dermatology, psychiatry, cardiac, respiratory, lymph, extremities, GI, GU, Musculoskeletal, constitutional, breasts, reproductive, HEENT.  All pertinent positives are noted in the HPI.  All others are negative.   PHYSICAL EXAMINATION: ECOG PERFORMANCE STATUS: 1 - Symptomatic but completely ambulatory  Vitals:   08/17/19 1400  BP: 137/62  Pulse: 68  Resp: 17  Temp: 98.9 F (37.2 C)  SpO2: 100%   Filed Weights   08/17/19 1400  Weight: 178 lb 8 oz (81 kg)   .Body mass index is 25.61 kg/m.   GENERAL:alert, in no acute distress and comfortable SKIN: no acute rashes, no significant lesions EYES: conjunctiva are pink and non-injected, sclera anicteric OROPHARYNX: MMM, no exudates, no oropharyngeal erythema or ulceration NECK: supple, no JVD LYMPH:  no palpable lymphadenopathy in the cervical, axillary or inguinal regions LUNGS: clear to auscultation b/l with normal respiratory effort HEART: regular rate & rhythm ABDOMEN:  normoactive bowel sounds , non tender, not distended. Extremity: no pedal edema PSYCH: alert & oriented x 3 with fluent speech NEURO: no focal motor/sensory deficits  LABORATORY DATA:  I have reviewed the data as listed  . CBC Latest Ref Rng & Units 08/17/2019 08/17/2018 08/16/2017  WBC 4.0 - 10.5 K/uL 20.5(H) 20.2(H) 16.9(H)  Hemoglobin 13.0 - 17.0 g/dL 12.8(L) 13.1 13.2  Hematocrit 39.0 - 52.0 % 39.1 39.6 39.3  Platelets 150 - 400  K/uL 309 267 284   . CBC    Component Value Date/Time   WBC 20.5 (H) 08/17/2019 1336   RBC 3.95 (L) 08/17/2019 1336   HGB 12.8 (L) 08/17/2019 1336   HGB 13.2 08/16/2017 1405   HGB 12.9 (L) 02/09/2017 1302   HCT 39.1 08/17/2019 1336   HCT 38.6 02/09/2017 1302   PLT 309 08/17/2019 1336   PLT 284 08/16/2017 1405   PLT 290 02/09/2017 1302   MCV 99.0 08/17/2019 1336   MCV 97.0 02/09/2017 1302   MCH 32.4 08/17/2019 1336   MCHC 32.7 08/17/2019 1336   RDW 12.5 08/17/2019 1336   RDW  12.7 02/09/2017 1302   LYMPHSABS 13.2 (H) 08/17/2019 1336   LYMPHSABS 12.9 (H) 02/09/2017 1302   MONOABS 0.8 08/17/2019 1336   MONOABS 0.7 02/09/2017 1302   EOSABS 0.2 08/17/2019 1336   EOSABS 0.2 02/09/2017 1302   BASOSABS 0.1 08/17/2019 1336   BASOSABS 0.0 02/09/2017 1302     CMP Latest Ref Rng & Units 08/17/2019 08/17/2018 08/16/2017  Glucose 70 - 99 mg/dL 84 123(H) 81  BUN 8 - 23 mg/dL 32(H) 27(H) 21  Creatinine 0.61 - 1.24 mg/dL 1.08 1.08 1.18  Sodium 135 - 145 mmol/L 138 138 138  Potassium 3.5 - 5.1 mmol/L 4.7 4.6 4.8  Chloride 98 - 111 mmol/L 102 103 102  CO2 22 - 32 mmol/L 28 28 29   Calcium 8.9 - 10.3 mg/dL 9.0 9.1 9.7  Total Protein 6.5 - 8.1 g/dL 6.6 7.0 6.9  Total Bilirubin 0.3 - 1.2 mg/dL 0.4 0.3 0.3  Alkaline Phos 38 - 126 U/L 52 58 58  AST 15 - 41 U/L 31 25 44(H)  ALT 0 - 44 U/L 33 28 35  . Lab Results  Component Value Date   LDH 187 08/17/2019         ASSESSMENT & PLAN:   Mr Dashnaw is a very pleasant 69 y.o.  Caucasian gentleman with  1) Rai Stage 0 CLL with lymphocytosis with no overt LNadenopathy or palpable hepatosplenomegaly. On presentation Absolute lymphocyte count of 8.8k with about 79 percent clonal lymphocytes on flow cytometry consistent with CLL. nl Hgb and platelets.  02/09/2017 labs, the patient's hbg is at 12.9, plt improved to 290k and WBC count of 19.6 with absolute lymphocyte count at 12.9.   CLL prognostic FISH panel neg for commonly tested  mutations. No constitutional symptoms. No new lymphadenopathy.  PLAN: -Discussed pt labwork today, 08/17/19; of CBC w/diff and CMP is as follows: all values are WNL except for WBC at 20.5K, RBC at 3.95, Hemoglobin at 12.8, Lymphs Abs at 13.2K, BUN at 32 -Discussed 08/17/19 of LDH at 187: WNL -Advised on night sweats -not likely related to CLL  -The pt shows no overt clinical or lab progression of his CLL at this time.  -No indication to initiate treatment at this time.  -Recommend continued watchful observation -Pt is aware of the concerning symptoms to be mindful for and will let me know if he develops any of these in the interim -Discussed criteria indicative of considering initiating treatment including cytopenias, constitutional symptoms, and threat of organ injury -Continue to f/u with PCP -Will see the pt back in one year  2) Constipation. Being mx by his PCP. On laxatives and Linzess to address this. Patient notes that he has been working with Dr. Collene Mares on his laxative regimen and that his constipation is much improved . He had a CT of the abdomen and pelvis on 06/13/2015  which showed no evidence of obstructing lesion. Moderate fecal burden throughout the colon. No lymphadenopathy in the abdomen or pelvis.  Colonoscopy was unrevealing for any concerning findings as per patient.  FOLLOW UP: RTC with Dr Irene Limbo with labs in 12 months  Orders Placed This Encounter  Procedures  . CBC with Differential/Platelet    Standing Status:   Future    Standing Expiration Date:   09/20/2020  . CMP ([place] only)    Standing Status:   Future    Standing Expiration Date:   08/16/2020  . Lactate dehydrogenase    Standing Status:   Future  Standing Expiration Date:   08/16/2020   The total time spent in the appt was 20 minutes and more than 50% was on counseling and direct patient cares.  All of the patient's questions were answered with apparent satisfaction. The patient knows to call the  clinic with any problems, questions or concerns.  Sullivan Lone MD [place] AAHIVMS [facility] Hematology/Oncology Physician [facility]  (Office):       [phone] (Work cell):  [phone] (Fax):           [phone]  I, Dawayne Cirri am acting as a scribe for Dr. Sullivan Lone.   .I have reviewed the above documentation for accuracy and completeness, and I agree with the above. Brunetta Genera MD

## 2019-08-17 ENCOUNTER — Other Ambulatory Visit: Payer: Self-pay

## 2019-08-17 ENCOUNTER — Inpatient Hospital Stay (HOSPITAL_BASED_OUTPATIENT_CLINIC_OR_DEPARTMENT_OTHER): Payer: Medicare Other | Admitting: Hematology

## 2019-08-17 ENCOUNTER — Inpatient Hospital Stay: Payer: Medicare Other | Attending: Hematology

## 2019-08-17 VITALS — BP 137/62 | HR 68 | Temp 98.9°F | Resp 17 | Ht 70.0 in | Wt 178.5 lb

## 2019-08-17 DIAGNOSIS — K5900 Constipation, unspecified: Secondary | ICD-10-CM | POA: Diagnosis not present

## 2019-08-17 DIAGNOSIS — C9110 Chronic lymphocytic leukemia of B-cell type not having achieved remission: Secondary | ICD-10-CM | POA: Diagnosis not present

## 2019-08-17 LAB — CBC WITH DIFFERENTIAL/PLATELET
Abs Immature Granulocytes: 0.04 10*3/uL (ref 0.00–0.07)
Basophils Absolute: 0.1 10*3/uL (ref 0.0–0.1)
Basophils Relative: 0 %
Eosinophils Absolute: 0.2 10*3/uL (ref 0.0–0.5)
Eosinophils Relative: 1 %
HCT: 39.1 % (ref 39.0–52.0)
Hemoglobin: 12.8 g/dL — ABNORMAL LOW (ref 13.0–17.0)
Immature Granulocytes: 0 %
Lymphocytes Relative: 64 %
Lymphs Abs: 13.2 10*3/uL — ABNORMAL HIGH (ref 0.7–4.0)
MCH: 32.4 pg (ref 26.0–34.0)
MCHC: 32.7 g/dL (ref 30.0–36.0)
MCV: 99 fL (ref 80.0–100.0)
Monocytes Absolute: 0.8 10*3/uL (ref 0.1–1.0)
Monocytes Relative: 4 %
Neutro Abs: 6.3 10*3/uL (ref 1.7–7.7)
Neutrophils Relative %: 31 %
Platelets: 309 10*3/uL (ref 150–400)
RBC: 3.95 MIL/uL — ABNORMAL LOW (ref 4.22–5.81)
RDW: 12.5 % (ref 11.5–15.5)
WBC: 20.5 10*3/uL — ABNORMAL HIGH (ref 4.0–10.5)
nRBC: 0 % (ref 0.0–0.2)

## 2019-08-17 LAB — CMP (CANCER CENTER ONLY)
ALT: 33 U/L (ref 0–44)
AST: 31 U/L (ref 15–41)
Albumin: 4 g/dL (ref 3.5–5.0)
Alkaline Phosphatase: 52 U/L (ref 38–126)
Anion gap: 8 (ref 5–15)
BUN: 32 mg/dL — ABNORMAL HIGH (ref 8–23)
CO2: 28 mmol/L (ref 22–32)
Calcium: 9 mg/dL (ref 8.9–10.3)
Chloride: 102 mmol/L (ref 98–111)
Creatinine: 1.08 mg/dL (ref 0.61–1.24)
GFR, Est AFR Am: >60 mL/min (ref >60)
GFR, Estimated: >60 mL/min (ref >60)
Glucose, Bld: 84 mg/dL (ref 70–99)
Potassium: 4.7 mmol/L (ref 3.5–5.1)
Sodium: 138 mmol/L (ref 135–145)
Total Bilirubin: 0.4 mg/dL (ref 0.3–1.2)
Total Protein: 6.6 g/dL (ref 6.5–8.1)

## 2019-08-17 LAB — LACTATE DEHYDROGENASE: LDH: 187 U/L (ref 98–192)

## 2019-08-18 ENCOUNTER — Telehealth: Payer: Self-pay | Admitting: Hematology

## 2019-08-18 NOTE — Telephone Encounter (Signed)
Scheduled per 04/29 los, patient has been called and notified.

## 2019-08-25 DIAGNOSIS — N401 Enlarged prostate with lower urinary tract symptoms: Secondary | ICD-10-CM | POA: Diagnosis not present

## 2019-08-29 DIAGNOSIS — E1021 Type 1 diabetes mellitus with diabetic nephropathy: Secondary | ICD-10-CM | POA: Diagnosis not present

## 2019-08-29 DIAGNOSIS — E7800 Pure hypercholesterolemia, unspecified: Secondary | ICD-10-CM | POA: Diagnosis not present

## 2019-08-29 DIAGNOSIS — K5901 Slow transit constipation: Secondary | ICD-10-CM | POA: Diagnosis not present

## 2019-08-29 DIAGNOSIS — Z9641 Presence of insulin pump (external) (internal): Secondary | ICD-10-CM | POA: Diagnosis not present

## 2019-08-29 DIAGNOSIS — Z1211 Encounter for screening for malignant neoplasm of colon: Secondary | ICD-10-CM | POA: Diagnosis not present

## 2019-08-29 DIAGNOSIS — I10 Essential (primary) hypertension: Secondary | ICD-10-CM | POA: Diagnosis not present

## 2019-09-01 DIAGNOSIS — N401 Enlarged prostate with lower urinary tract symptoms: Secondary | ICD-10-CM | POA: Diagnosis not present

## 2019-09-01 DIAGNOSIS — R3915 Urgency of urination: Secondary | ICD-10-CM | POA: Diagnosis not present

## 2019-09-04 DIAGNOSIS — Z1212 Encounter for screening for malignant neoplasm of rectum: Secondary | ICD-10-CM | POA: Diagnosis not present

## 2019-09-04 DIAGNOSIS — Z1211 Encounter for screening for malignant neoplasm of colon: Secondary | ICD-10-CM | POA: Diagnosis not present

## 2019-09-05 ENCOUNTER — Other Ambulatory Visit: Payer: Self-pay | Admitting: Urology

## 2019-09-09 LAB — COLOGUARD: COLOGUARD: NEGATIVE

## 2019-09-19 DIAGNOSIS — M545 Low back pain: Secondary | ICD-10-CM | POA: Diagnosis not present

## 2019-09-19 DIAGNOSIS — M810 Age-related osteoporosis without current pathological fracture: Secondary | ICD-10-CM | POA: Diagnosis not present

## 2019-09-19 DIAGNOSIS — M8580 Other specified disorders of bone density and structure, unspecified site: Secondary | ICD-10-CM | POA: Diagnosis not present

## 2019-10-09 ENCOUNTER — Encounter (HOSPITAL_BASED_OUTPATIENT_CLINIC_OR_DEPARTMENT_OTHER): Payer: Self-pay | Admitting: Urology

## 2019-10-09 NOTE — Progress Notes (Signed)
Spoke w/ via phone for pre-op interview---PT Lab needs dos----    I stat 8, ekg           COVID test ------10-10-2019 at [facility] at -------530 am 10-12-2019 NPO after ------midnight Medications to take morning of surgery -----insulin pump at basal rate Diabetic medication -----n/a Patient Special Instructions -----bring insulin pump supplies, has right side tandem insulin pump, has sensor on left abdomen with no needle in it. Pre-Op special Istructions -----none Patient verbalized understanding of instructions that were given at this phone interview. Patient denies shortness of breath, chest pain, fever, cough a this phone interview.

## 2019-10-10 ENCOUNTER — Other Ambulatory Visit (HOSPITAL_COMMUNITY): Payer: Medicare Other

## 2019-10-10 ENCOUNTER — Other Ambulatory Visit (HOSPITAL_COMMUNITY)
Admission: RE | Admit: 2019-10-10 | Discharge: 2019-10-10 | Disposition: A | Discharge status: Home / Self Care | Payer: Medicare Other | Source: Ambulatory Visit | Attending: Urology | Admitting: Urology

## 2019-10-10 DIAGNOSIS — Z01812 Encounter for preprocedural laboratory examination: Secondary | ICD-10-CM | POA: Diagnosis present

## 2019-10-10 DIAGNOSIS — Z20822 Contact with and (suspected) exposure to covid-19: Secondary | ICD-10-CM | POA: Insufficient documentation

## 2019-10-10 LAB — SARS CORONAVIRUS 2 (TAT 6-24 HRS): SARS Coronavirus 2: NEGATIVE

## 2019-10-11 NOTE — Anesthesia Preprocedure Evaluation (Addendum)
Anesthesia Evaluation  Patient identified by MRN, date of birth, ID band Patient awake    Reviewed: Allergy & Precautions, NPO status , Patient's Chart, lab work & pertinent test results  Airway Mallampati: II  TM Distance: >3 FB Neck ROM: Full    Dental  (+) Teeth Intact, Dental Advisory Given   Pulmonary neg pulmonary ROS,    Pulmonary exam normal breath sounds clear to auscultation       Cardiovascular hypertension, Pt. on medications Normal cardiovascular exam Rhythm:Regular Rate:Normal     Neuro/Psych Normal pressure hydrocephalus s/p VP Shunt negative psych ROS   GI/Hepatic negative GI ROS, Neg liver ROS,   Endo/Other  diabetes (Insulin pump), Type 1, Insulin Dependent  Renal/GU negative Renal ROS   BENIGN PROSTATIC HYPERPLASIA WITH URGENCY    Musculoskeletal  (+) Arthritis ,   Abdominal   Peds  Hematology negative hematology ROS (+) CLL   Anesthesia Other Findings   Reproductive/Obstetrics                            Anesthesia Physical Anesthesia Plan  ASA: III  Anesthesia Plan: General   Post-op Pain Management:    Induction: Intravenous  PONV Risk Score and Plan: 3 and Midazolam, Dexamethasone and Ondansetron  Airway Management Planned: LMA  Additional Equipment:   Intra-op Plan:   Post-operative Plan: Extubation in OR  Informed Consent: I have reviewed the patients History and Physical, chart, labs and discussed the procedure including the risks, benefits and alternatives for the proposed anesthesia with the patient or authorized representative who has indicated his/her understanding and acceptance.     Dental advisory given  Plan Discussed with: CRNA  Anesthesia Plan Comments:        Anesthesia Quick Evaluation

## 2019-10-12 ENCOUNTER — Ambulatory Visit (HOSPITAL_BASED_OUTPATIENT_CLINIC_OR_DEPARTMENT_OTHER)
Admission: RE | Admit: 2019-10-12 | Discharge: 2019-10-12 | Disposition: A | Discharge status: Home / Self Care | Payer: Medicare Other | Attending: Urology | Admitting: Urology

## 2019-10-12 ENCOUNTER — Ambulatory Visit (HOSPITAL_BASED_OUTPATIENT_CLINIC_OR_DEPARTMENT_OTHER): Payer: Medicare Other | Admitting: Anesthesiology

## 2019-10-12 ENCOUNTER — Encounter (HOSPITAL_BASED_OUTPATIENT_CLINIC_OR_DEPARTMENT_OTHER): Payer: Self-pay | Admitting: Urology

## 2019-10-12 ENCOUNTER — Encounter (HOSPITAL_BASED_OUTPATIENT_CLINIC_OR_DEPARTMENT_OTHER)
Admission: RE | Disposition: A | Discharge status: Home / Self Care | Payer: Self-pay | Source: Home / Self Care | Attending: Urology

## 2019-10-12 ENCOUNTER — Other Ambulatory Visit: Payer: Self-pay

## 2019-10-12 DIAGNOSIS — R350 Frequency of micturition: Secondary | ICD-10-CM | POA: Insufficient documentation

## 2019-10-12 DIAGNOSIS — Z8249 Family history of ischemic heart disease and other diseases of the circulatory system: Secondary | ICD-10-CM | POA: Diagnosis not present

## 2019-10-12 DIAGNOSIS — N401 Enlarged prostate with lower urinary tract symptoms: Secondary | ICD-10-CM | POA: Diagnosis not present

## 2019-10-12 DIAGNOSIS — M1990 Unspecified osteoarthritis, unspecified site: Secondary | ICD-10-CM | POA: Diagnosis not present

## 2019-10-12 DIAGNOSIS — Z981 Arthrodesis status: Secondary | ICD-10-CM | POA: Insufficient documentation

## 2019-10-12 DIAGNOSIS — N138 Other obstructive and reflux uropathy: Secondary | ICD-10-CM | POA: Insufficient documentation

## 2019-10-12 DIAGNOSIS — G912 (Idiopathic) normal pressure hydrocephalus: Secondary | ICD-10-CM | POA: Diagnosis not present

## 2019-10-12 DIAGNOSIS — Z9842 Cataract extraction status, left eye: Secondary | ICD-10-CM | POA: Diagnosis not present

## 2019-10-12 DIAGNOSIS — Z9841 Cataract extraction status, right eye: Secondary | ICD-10-CM | POA: Insufficient documentation

## 2019-10-12 DIAGNOSIS — Z982 Presence of cerebrospinal fluid drainage device: Secondary | ICD-10-CM | POA: Insufficient documentation

## 2019-10-12 DIAGNOSIS — Z91040 Latex allergy status: Secondary | ICD-10-CM | POA: Diagnosis not present

## 2019-10-12 DIAGNOSIS — E7800 Pure hypercholesterolemia, unspecified: Secondary | ICD-10-CM | POA: Insufficient documentation

## 2019-10-12 DIAGNOSIS — I10 Essential (primary) hypertension: Secondary | ICD-10-CM | POA: Insufficient documentation

## 2019-10-12 DIAGNOSIS — R3915 Urgency of urination: Secondary | ICD-10-CM | POA: Insufficient documentation

## 2019-10-12 DIAGNOSIS — Z833 Family history of diabetes mellitus: Secondary | ICD-10-CM | POA: Diagnosis not present

## 2019-10-12 DIAGNOSIS — C9110 Chronic lymphocytic leukemia of B-cell type not having achieved remission: Secondary | ICD-10-CM | POA: Insufficient documentation

## 2019-10-12 DIAGNOSIS — E109 Type 1 diabetes mellitus without complications: Secondary | ICD-10-CM | POA: Insufficient documentation

## 2019-10-12 HISTORY — PX: CYSTOSCOPY WITH INSERTION OF UROLIFT: SHX6678

## 2019-10-12 HISTORY — DX: Unspecified osteoarthritis, unspecified site: M19.90

## 2019-10-12 HISTORY — DX: Benign prostatic hyperplasia without lower urinary tract symptoms: N40.0

## 2019-10-12 HISTORY — DX: Type 1 diabetes mellitus without complications: E10.9

## 2019-10-12 LAB — POCT I-STAT, CHEM 8
BUN: 24 mg/dL — ABNORMAL HIGH (ref 8–23)
Calcium, Ion: 1.28 mmol/L (ref 1.15–1.40)
Chloride: 100 mmol/L (ref 98–111)
Creatinine, Ser: 0.9 mg/dL (ref 0.61–1.24)
Glucose, Bld: 124 mg/dL — ABNORMAL HIGH (ref 70–99)
HCT: 38 % — ABNORMAL LOW (ref 39.0–52.0)
Hemoglobin: 12.9 g/dL — ABNORMAL LOW (ref 13.0–17.0)
Potassium: 4.1 mmol/L (ref 3.5–5.1)
Sodium: 137 mmol/L (ref 135–145)
TCO2: 28 mmol/L (ref 22–32)

## 2019-10-12 LAB — GLUCOSE, CAPILLARY: Glucose-Capillary: 122 mg/dL — ABNORMAL HIGH (ref 70–99)

## 2019-10-12 SURGERY — CYSTOSCOPY WITH INSERTION OF UROLIFT
Anesthesia: General | Site: Bladder

## 2019-10-12 MED ORDER — FENTANYL CITRATE (PF) 100 MCG/2ML IJ SOLN: 25.0000 ug | INTRAMUSCULAR | Status: DC | PRN

## 2019-10-12 MED ORDER — FENTANYL CITRATE (PF) 100 MCG/2ML IJ SOLN
INTRAMUSCULAR | Status: AC
  Filled 2019-10-12: qty 2

## 2019-10-12 MED ORDER — CEFAZOLIN SODIUM-DEXTROSE 2-4 GM/100ML-% IV SOLN
2.0000 g | INTRAVENOUS | Status: AC
  Administered 2019-10-12: 2 g via INTRAVENOUS

## 2019-10-12 MED ORDER — ACETAMINOPHEN 500 MG PO TABS
1000.0000 mg | ORAL_TABLET | Freq: Once | ORAL | Status: AC
  Administered 2019-10-12: 1000 mg via ORAL

## 2019-10-12 MED ORDER — ONDANSETRON HCL 4 MG/2ML IJ SOLN
INTRAMUSCULAR | Status: AC
  Filled 2019-10-12: qty 2

## 2019-10-12 MED ORDER — LACTATED RINGERS IV SOLN: INTRAVENOUS | Status: DC

## 2019-10-12 MED ORDER — STERILE WATER FOR IRRIGATION IR SOLN
Status: DC | PRN
  Administered 2019-10-12: 3000 mL

## 2019-10-12 MED ORDER — ACETAMINOPHEN 500 MG PO TABS
ORAL_TABLET | ORAL | Status: AC
  Filled 2019-10-12: qty 2

## 2019-10-12 MED ORDER — LIDOCAINE 2% (20 MG/ML) 5 ML SYRINGE
INTRAMUSCULAR | Status: AC
  Filled 2019-10-12: qty 5

## 2019-10-12 MED ORDER — CELECOXIB 200 MG PO CAPS
ORAL_CAPSULE | ORAL | Status: AC
  Filled 2019-10-12: qty 1

## 2019-10-12 MED ORDER — EPHEDRINE SULFATE 50 MG/ML IJ SOLN
INTRAMUSCULAR | Status: DC | PRN
  Administered 2019-10-12 (×2): 20 mg via INTRAVENOUS

## 2019-10-12 MED ORDER — FENTANYL CITRATE (PF) 100 MCG/2ML IJ SOLN
INTRAMUSCULAR | Status: DC | PRN
  Administered 2019-10-12: 25 ug via INTRAVENOUS
  Administered 2019-10-12: 50 ug via INTRAVENOUS
  Administered 2019-10-12: 25 ug via INTRAVENOUS

## 2019-10-12 MED ORDER — PROPOFOL 10 MG/ML IV BOLUS
INTRAVENOUS | Status: DC | PRN
  Administered 2019-10-12: 150 mg via INTRAVENOUS
  Administered 2019-10-12: 50 mg via INTRAVENOUS

## 2019-10-12 MED ORDER — ONDANSETRON HCL 4 MG/2ML IJ SOLN
INTRAMUSCULAR | Status: DC | PRN
  Administered 2019-10-12: 4 mg via INTRAVENOUS

## 2019-10-12 MED ORDER — CELECOXIB 200 MG PO CAPS
200.0000 mg | ORAL_CAPSULE | Freq: Once | ORAL | Status: AC
  Administered 2019-10-12: 200 mg via ORAL

## 2019-10-12 MED ORDER — CEFAZOLIN SODIUM-DEXTROSE 2-4 GM/100ML-% IV SOLN
INTRAVENOUS | Status: AC
  Filled 2019-10-12: qty 100

## 2019-10-12 MED ORDER — DEXAMETHASONE SODIUM PHOSPHATE 10 MG/ML IJ SOLN
INTRAMUSCULAR | Status: AC
  Filled 2019-10-12: qty 1

## 2019-10-12 MED ORDER — ONDANSETRON HCL 4 MG/2ML IJ SOLN: 4.0000 mg | Freq: Once | INTRAMUSCULAR | Status: DC | PRN

## 2019-10-12 MED ORDER — PROPOFOL 10 MG/ML IV BOLUS
INTRAVENOUS | Status: AC
  Filled 2019-10-12: qty 40

## 2019-10-12 MED ORDER — LIDOCAINE HCL (CARDIAC) PF 100 MG/5ML IV SOSY
PREFILLED_SYRINGE | INTRAVENOUS | Status: DC | PRN
  Administered 2019-10-12: 80 mg via INTRAVENOUS

## 2019-10-12 MED ORDER — TRAMADOL HCL 50 MG PO TABS: 50.0000 mg | ORAL_TABLET | Freq: Four times a day (QID) | ORAL | 0 refills | Status: AC | PRN

## 2019-10-12 SURGICAL SUPPLY — 22 items
BAG DRAIN URO-CYSTO SKYTR STRL (DRAIN) ×3 IMPLANT
BAG DRN RND TRDRP ANRFLXCHMBR (UROLOGICAL SUPPLIES) ×1
BAG DRN UROCATH (DRAIN) ×1
BAG URINE DRAIN 2000ML AR STRL (UROLOGICAL SUPPLIES) ×3 IMPLANT
CATH FOLEY 2WAY  5CC 16FR SIL (CATHETERS) ×3
CATH FOLEY 2WAY 5CC 16FR SIL (CATHETERS) IMPLANT
CATH FOLEY 2WAY SLVR  5CC 16FR (CATHETERS)
CATH FOLEY 2WAY SLVR 5CC 16FR (CATHETERS) ×1 IMPLANT
CLOTH BEACON ORANGE TIMEOUT ST (SAFETY) ×3 IMPLANT
GLOVE BIO SURGEON STRL SZ8 (GLOVE) ×1 IMPLANT
GLOVE BIOGEL PI IND STRL 7.5 (GLOVE) IMPLANT
GLOVE BIOGEL PI INDICATOR 7.5 (GLOVE) ×4
GLOVE ORTHO TXT STRL SZ7.5 (GLOVE) ×2 IMPLANT
GOWN STRL REUS W/TWL LRG LVL3 (GOWN DISPOSABLE) ×2 IMPLANT
GOWN STRL REUS W/TWL XL LVL3 (GOWN DISPOSABLE) ×5 IMPLANT
HOLDER FOLEY CATH W/STRAP (MISCELLANEOUS) ×3 IMPLANT
IV NS IRRIG 3000ML ARTHROMATIC (IV SOLUTION) ×6 IMPLANT
MANIFOLD NEPTUNE II (INSTRUMENTS) ×3 IMPLANT
PACK CYSTO (CUSTOM PROCEDURE TRAY) ×3 IMPLANT
SYSTEM UROLIFT (Male Continence) ×12 IMPLANT
TUBE CONNECTING 12'X1/4 (SUCTIONS) ×1
TUBE CONNECTING 12X1/4 (SUCTIONS) ×2 IMPLANT

## 2019-10-12 NOTE — Transfer of Care (Signed)
Immediate Anesthesia Transfer of Care Note  Patient: Christopher Conner  Procedure(s) Performed: Procedure(s) (LRB): CYSTOSCOPY WITH INSERTION OF UROLIFT ([name])  Patient Location: PACU  Anesthesia Type: General  Level of Consciousness: awake, sedated, patient cooperative and responds to stimulation  Airway & Oxygen Therapy: Patient Spontanous Breathing and Patient connected to NC02 and soft FM   Post-op Assessment: Report given to PACU RN, Post -op Vital signs reviewed and stable and Patient moving all extremities  Post vital signs: Reviewed and stable  Complications: No apparent anesthesia complications

## 2019-10-12 NOTE — Discharge Instructions (Signed)
Indwelling Urinary Catheter Care, Adult An indwelling urinary catheter is a thin tube that is put into your bladder. The tube helps to drain pee (urine) out of your body. The tube goes in through your urethra. Your urethra is where pee comes out of your body. Your pee will come out through the catheter, then it will go into a bag (drainage bag). Take good care of your catheter so it will work well. How to wear your catheter and bag Supplies needed  Sticky tape (adhesive tape) or a leg strap.  Alcohol wipe or soap and water (if you use tape).  A clean towel (if you use tape).  Large overnight bag.  Smaller bag (leg bag). Wearing your catheter Attach your catheter to your leg with tape or a leg strap.  Make sure the catheter is not pulled tight.  If a leg strap gets wet, take it off and put on a dry strap.  If you use tape to hold the bag on your leg: 1. Use an alcohol wipe or soap and water to wash your skin where the tape made it sticky before. 2. Use a clean towel to pat-dry that skin. 3. Use new tape to make the bag stay on your leg. Wearing your bags You should have been given a large overnight bag.  You may wear the overnight bag in the day or night.  Always have the overnight bag lower than your bladder.  Do not let the bag touch the floor.  Before you go to sleep, put a clean plastic bag in a wastebasket. Then hang the overnight bag inside the wastebasket. You should also have a smaller leg bag that fits under your clothes.  Always wear the leg bag below your knee.  Do not wear your leg bag at night. How to care for your skin and catheter Supplies needed  A clean washcloth.  Water and mild soap.  A clean towel. Caring for your skin and catheter      Clean the skin around your catheter every day: 1. Wash your hands with soap and water. 2. Wet a clean washcloth in warm water and mild soap. 3. Clean the skin around your urethra.  If you are  male:  Gently spread the folds of skin around your vagina (labia).  With the washcloth in your other hand, wipe the inner side of your labia on each side. Wipe from front to back.  If you are male:  Pull back any skin that covers the end of your penis (foreskin).  With the washcloth in your other hand, wipe your penis in small circles. Start wiping at the tip of your penis, then move away from the catheter.  Move the foreskin back in place, if needed. 4. With your free hand, hold the catheter close to where it goes into your body.  Keep holding the catheter during cleaning so it does not get pulled out. 5. With the washcloth in your other hand, clean the catheter.  Only wipe downward on the catheter.  Do not wipe upward toward your body. Doing this may push germs into your urethra and cause infection. 6. Use a clean towel to pat-dry the catheter and the skin around it. Make sure to wipe off all soap. 7. Wash your hands with soap and water.  Shower every day. Do not take baths.  Do not use cream, ointment, or lotion on the area where the catheter goes into your body, unless your doctor tells you   to.  Do not use powders, sprays, or lotions on your genital area.  Check your skin around the catheter every day for signs of infection. Check for: ? Redness, swelling, or pain. ? Fluid or blood. ? Warmth. ? Pus or a bad smell. How to empty the bag Supplies needed  Rubbing alcohol.  Gauze pad or cotton ball.  Tape or a leg strap. Emptying the bag Pour the pee out of your bag when it is ?- full, or at least 2-3 times a day. Do this for your overnight bag and your leg bag. 1. Wash your hands with soap and water. 2. Separate (detach) the bag from your leg. 3. Hold the bag over the toilet or a clean pail. Keep the bag lower than your hips and bladder. This is so the pee (urine) does not go back into the tube. 4. Open the pour spout. It is at the bottom of the bag. 5. Empty the  pee into the toilet or pail. Do not let the pour spout touch any surface. 6. Put rubbing alcohol on a gauze pad or cotton ball. 7. Use the gauze pad or cotton ball to clean the pour spout. 8. Close the pour spout. 9. Attach the bag to your leg with tape or a leg strap. 10. Wash your hands with soap and water. Follow instructions for cleaning the drainage bag:  From the product maker.  As told by your doctor. How to change the bag Supplies needed  Alcohol wipes.  A clean bag.  Tape or a leg strap. Changing the bag Replace your bag when it starts to leak, smell bad, or look dirty. 1. Wash your hands with soap and water. 2. Separate the dirty bag from your leg. 3. Pinch the catheter with your fingers so that pee does not spill out. 4. Separate the catheter tube from the bag tube where these tubes connect (at the connection valve). Do not let the tubes touch any surface. 5. Clean the end of the catheter tube with an alcohol wipe. Use a different alcohol wipe to clean the end of the bag tube. 6. Connect the catheter tube to the tube of the clean bag. 7. Attach the clean bag to your leg with tape or a leg strap. Do not make the bag tight on your leg. 8. Wash your hands with soap and water. General rules   Never pull on your catheter. Never try to take it out. Doing that can hurt you.  Always wash your hands before and after you touch your catheter or bag. Use a mild, fragrance-free soap. If you do not have soap and water, use hand sanitizer.  Always make sure there are no twists or bends (kinks) in the catheter tube.  Always make sure there are no leaks in the catheter or bag.  Drink enough fluid to keep your pee pale yellow.  Do not take baths, swim, or use a hot tub.  If you are male, wipe from front to back after you poop (have a bowel movement). Contact a doctor if:  Your pee is cloudy.  Your pee smells worse than usual.  Your catheter gets clogged.  Your catheter  leaks.  Your bladder feels full. Get help right away if:  You have redness, swelling, or pain where the catheter goes into your body.  You have fluid, blood, pus, or a bad smell coming from the area where the catheter goes into your body.  Your skin feels warm where   the catheter goes into your body.  You have a fever.  You have pain in your: ? Belly (abdomen). ? Legs. ? Lower back. ? Bladder.  You see blood in the catheter.  Your pee is pink or red.  You feel sick to your stomach (nauseous).  You throw up (vomit).  You have chills.  Your pee is not draining into the bag.  Your catheter gets pulled out. Summary  An indwelling urinary catheter is a thin tube that is placed into the bladder to help drain pee (urine) out of the body.  The catheter is placed into the part of the body that drains pee from the bladder (urethra).  Taking good care of your catheter will keep it working properly and help prevent problems.  Always wash your hands before and after touching your catheter or bag.  Never pull on your catheter or try to take it out. This information is not intended to replace advice given to you by your health care provider. Make sure you discuss any questions you have with your health care provider. Document Revised: 07/29/2018 Document Reviewed: 11/20/2016 Elsevier Patient Education  2020 [employer]  CYSTOSCOPY HOME CARE INSTRUCTIONS  Activity: Rest for the remainder of the day.  Do not drive or operate equipment today.  You may resume normal activities in one to two days as instructed by your physician.   Meals: Drink plenty of liquids and eat light foods such as gelatin or soup this evening.  You may return to a normal meal plan tomorrow.  Return to Work: You may return to work in one to two days or as instructed by your physician.  Special Instructions / Symptoms: Call your physician if any of these symptoms occur:   -persistent or heavy  bleeding  -bleeding which continues after first few urination  -large blood clots that are difficult to pass  -urine stream diminishes or stops completely  -fever equal to or higher than 101 degrees Farenheit.  -cloudy urine with a strong, foul odor  -severe pain  Females should always wipe from front to back after elimination.  You may feel some burning pain when you urinate.  This should disappear with time.  Applying moist heat to the lower abdomen or a hot tub bath may help relieve the pain. \  Follow-Up / Date of Return Visit to Your Physician: as instructed Call for an appointment to arrange follow-up.  Patient Signature:  ________________________________________________________  Nurse's Signature:  ________________________________________________________    Post Anesthesia Home Care Instructions  Activity: Get plenty of rest for the remainder of the day. A responsible individual must stay with you for 24 hours following the procedure.  For the next 24 hours, DO NOT: -Drive a car -Operate machinery -Drink alcoholic beverages -Take any medication unless instructed by your physician -Make any legal decisions or sign important papers.  Meals: Start with liquid foods such as gelatin or soup. Progress to regular foods as tolerated. Avoid greasy, spicy, heavy foods. If nausea and/or vomiting occur, drink only clear liquids until the nausea and/or vomiting subsides. Call your physician if vomiting continues.  Special Instructions/Symptoms: Your throat may feel dry or sore from the anesthesia or the breathing tube placed in your throat during surgery. If this causes discomfort, gargle with warm salt water. The discomfort should disappear within 24 hours.  If you had a scopolamine patch placed behind your ear for the management of post- operative nausea and/or vomiting:  1. The medication in the patch   is effective for 72 hours, after which it should be removed.  Wrap patch in a  tissue and discard in the trash. Wash hands thoroughly with soap and water. 2. You may remove the patch earlier than 72 hours if you experience unpleasant side effects which may include dry mouth, dizziness or visual disturbances. 3. Avoid touching the patch. Wash your hands with soap and water after contact with the patch.       

## 2019-10-12 NOTE — Op Note (Signed)
   PREOPERATIVE DIAGNOSIS: Benign prostatic hypertrophy with bladder outlet obstruction.  POSTOPERATIVE DIAGNOSIS: Benign prostatic hypertrophy with bladder outlet obstruction.  PROCEDURE: Cystoscopy with implantation of UroLift devices, 6 implants.  SURGEON: Nicolette Bang, M.D.  ANESTHESIA: General  ANTIBIOTICS: ancef  SPECIMEN: None.  DRAINS: A 16-French Foley catheter.  BLOOD LOSS: Minimal.  COMPLICATIONS: None.  INDICATIONS:The Patient is an 69 year old male with BPH and bladder outlet obstruction. He has failed medical therapy and has elected UroLift for definitive treatment.  FINDINGS OF PROCEDURE: He was taken to the operating room where a genral anesthetic was induced. He was placed in lithotomy position and was fitted with PAS hose. His perineum and genitalia were prepped with chlorhexidine, and he was draped in usual sterile fashion.  Cystoscopy was performed using the UroLift scope and 0 degree lens. Examination revealed a normal urethra. The external sphincter was intact. Prostatic urethra was approximately 4 cm in length with lateral lobe enlargement. There was also little bit of bladder neck elevation. Inspection of bladder revealed mild-to-moderate trabeculation with no tumors, stones, or inflammation. No cellules or diverticula were noted. Ureteral orifices were in their normal anatomic position effluxing clear urine.  After initial cystoscopy, the visual obturator was replaced with the first UroLift device. This was turned to the 9 o'clock position and pulled back to the veru and then slightly advanced. Pressure was then applied to the right lateral lobe and the UroLift device was deployed.  The second UroLift device was then inserted and applied to the left lateral lobe at 3 o'clock and deployed in the mid prostatic urethra. After this, there was still some apparent obstruction closer to the bladder neck. So a second level of  UroLift your left device was applied between the mid urethra and the proximal urethra providing further patency to the prostatic urethra. At this point, there was mild bleeding but the patient did have a spinal anesthetic. So it was thought that a Foley catheter was indicated. The scope was removed and a 16-French Foley catheter was inserted without difficulty. The balloon was filled with 10 mL sterile fluid, and the catheter was placed to straight drainage.  COMPLICATIONS: None   CONDITION: Stable, extubated, transferred to PACU  PLAN: The patient will be discharged home and followup in 2 days for a voiding trial.

## 2019-10-12 NOTE — H&P (Signed)
Urology Admission H&P  Chief Complaint: urinary urgency  History of Present Illness: [name] is a 69yo with a hx of BPH refractory to medical therapy who underwent urolift 4 months ago. He continued to have urinary urgency, frequency and on office cystoscopy he was found to have obstructing apical tissue. He denies dysuria or hematuria. He has issues with incomplete emptying  Past Medical History:  Diagnosis Date  . Arthritis   . BPH (benign prostatic hyperplasia)   . Chronic lymphocytic leukemia ([place]) early 2018   no current treatment  . CLL (chronic lymphocytic leukemia) ([place])   . DM type 1 (diabetes mellitus, type 1) ([facility])    type 1  . High cholesterol   . Hypertension   . Normal pressure hydrocephalus ([place]) 2006   brain shut inserted   Past Surgical History:  Procedure Laterality Date  . APPENDECTOMY  early 20's  . BACK SURGERY    . CATARACT EXTRACTION Bilateral    left eye prostheisi "shell"  . CYST REMOVAL NECK  yrs ago  . CYSTOSCOPY WITH INSERTION OF UROLIFT  april/march 2020   done in office  . LUMBAR FUSION  1998 and 2000   l5 to s1 1998, then l4 and l5 in 2000  . VENTRICULOPERITONEAL SHUNT  2006    Home Medications:  Current Facility-Administered Medications  Medication Dose Route Frequency Provider Last Rate Last Admin  . ceFAZolin (ANCEF) IVPB 2g/100 mL premix  2 g Intravenous 30 min Pre-Op Cleon Gustin, MD      . lactated ringers infusion   Intravenous Continuous Belinda Block, MD 50 mL/hr at 10/12/19 0630 New Bag at 10/12/19 0630  . sterile water for irrigation for irrigation    PRN Alyson Ingles Candee Furbish, MD   3,000 mL at 10/12/19 0708   Allergies:  Allergies  Allergen Reactions  . Latex Itching and Rash    Family History  Problem Relation Age of Onset  . Hypertension Mother   . Hyperlipidemia Mother   . Diabetes Mother   . Diabetes Father    Social History:  reports that he has never smoked. He has never used smokeless tobacco. He reports  that he does not drink alcohol and does not use drugs.  Review of Systems  Genitourinary: Positive for difficulty urinating and urgency.  All other systems reviewed and are negative.   Physical Exam:  Vital signs in last 24 hours: Temp:  [98.9 F (37.2 C)] 98.9 F (37.2 C) (06/24 0618) Pulse Rate:  [62] 62 (06/24 0618) Resp:  [18] 18 (06/24 0618) BP: (139)/(59) 139/59 (06/24 0618) SpO2:  [100 %] 100 % (06/24 0618) Weight:  [81.4 kg] 81.4 kg (06/24 0618) Physical Exam  Constitutional: He is oriented to person, place, and time.  HENT:  Head: Normocephalic and atraumatic.  Eyes: Pupils are equal, round, and reactive to light.  Cardiovascular: Normal rate and regular rhythm.  Respiratory: Effort normal. No respiratory distress.  GI: Normal appearance. He exhibits no distension.  Musculoskeletal:        General: Normal range of motion.     Cervical back: Normal range of motion and neck supple.  Neurological: He is alert and oriented to person, place, and time.  Skin: Skin is warm and dry.  Psychiatric: His behavior is normal. Mood, judgment and thought content normal.    Laboratory Data:  Results for orders placed or performed during the hospital encounter of 10/12/19 (from the past 24 hour(s))  I-STAT, chem 8  Status: Abnormal   Collection Time: 10/12/19  6:45 AM  Result Value Ref Range   Sodium 137 135 - 145 mmol/L   Potassium 4.1 3.5 - 5.1 mmol/L   Chloride 100 98 - 111 mmol/L   BUN 24 (H) 8 - 23 mg/dL   Creatinine, Ser 0.90 0.61 - 1.24 mg/dL   Glucose, Bld 124 (H) 70 - 99 mg/dL   Calcium, Ion 1.28 1.15 - 1.40 mmol/L   TCO2 28 22 - 32 mmol/L   Hemoglobin 12.9 (L) 13.0 - 17.0 g/dL   HCT 38.0 (L) 39 - 52 %   Recent Results (from the past 240 hour(s))  SARS CORONAVIRUS 2 (TAT 6-24 HRS) Nasopharyngeal Nasopharyngeal Swab     Status: None   Collection Time: 10/10/19  2:57 PM   Specimen: Nasopharyngeal Swab  Result Value Ref Range Status   SARS Coronavirus 2 NEGATIVE  NEGATIVE Final    Comment: (NOTE) SARS-CoV-2 target nucleic acids are NOT DETECTED.  The SARS-CoV-2 RNA is generally detectable in upper and lower respiratory specimens during the acute phase of infection. Negative results do not preclude SARS-CoV-2 infection, do not rule out co-infections with other pathogens, and should not be used as the sole basis for treatment or other patient management decisions. Negative results must be combined with clinical observations, patient history, and epidemiological information. The expected result is Negative.  Fact Sheet for Patients: SugarRoll.be  Fact Sheet for Healthcare Providers: https://www.woods-mathews.com/  This test is not yet approved or cleared by the [place] FDA and  has been authorized for detection and/or diagnosis of SARS-CoV-2 by FDA under an Emergency Use Authorization (EUA). This EUA will remain  in effect (meaning this test can be used) for the duration of the COVID-19 declaration under Se ction 564(b)(1) of the Act, 21 U.S.C. section 360bbb-3(b)(1), unless the authorization is terminated or revoked sooner.  Performed at [facility], [place] [address] [place], [place] [address]    Creatinine: Recent Labs    10/12/19 0645  CREATININE 0.90   Baseline Creatinine: 0.9  Impression/Assessment:  68yo with BPH with Urinary urgecny  Plan:  The risks/benefits/alternatives to Urolift was explained to the patient and he understands and wishes to proceed with surgery  Nicolette Bang 10/12/2019, 7:38 AM

## 2019-10-12 NOTE — Anesthesia Postprocedure Evaluation (Signed)
Anesthesia Post Note  Patient: Christopher Conner  Procedure(s) Performed: CYSTOSCOPY WITH INSERTION OF UROLIFT ([name] Bladder)     Patient location during evaluation: PACU Anesthesia Type: General Level of consciousness: awake and alert Pain management: pain level controlled Vital Signs Assessment: post-procedure vital signs reviewed and stable Respiratory status: spontaneous breathing, nonlabored ventilation, respiratory function stable and patient connected to nasal cannula oxygen Cardiovascular status: blood pressure returned to baseline and stable Postop Assessment: no apparent nausea or vomiting Anesthetic complications: no   No complications documented.  Last Vitals:  Vitals:   10/12/19 0845 10/12/19 0945  BP: (!) 157/61 (!) 168/70  Pulse: 83   Resp: 13 20  Temp:  (!) 36.3 C  SpO2: 95% 100%    Last Pain:  Vitals:   10/12/19 0945  TempSrc:   PainSc: 0-No pain                 Catalina Gravel

## 2019-10-12 NOTE — Anesthesia Procedure Notes (Signed)
Procedure Name: LMA Insertion Date/Time: 10/12/2019 7:48 AM Performed by: Justice Rocher, CRNA Pre-anesthesia Checklist: Patient identified, Emergency Drugs available, Suction available, Patient being monitored and Timeout performed Patient Re-evaluated:Patient Re-evaluated prior to induction Oxygen Delivery Method: Circle system utilized Preoxygenation: Pre-oxygenation with 100% oxygen Induction Type: IV induction Ventilation: Mask ventilation without difficulty LMA: LMA inserted LMA Size: 5.0 Number of attempts: 1 Airway Equipment and Method: Bite block Placement Confirmation: positive ETCO2,  breath sounds checked- equal and bilateral and CO2 detector Tube secured with: Tape Dental Injury: Teeth and Oropharynx as per pre-operative assessment

## 2019-10-13 ENCOUNTER — Encounter (HOSPITAL_BASED_OUTPATIENT_CLINIC_OR_DEPARTMENT_OTHER): Payer: Self-pay | Admitting: Urology

## 2019-10-30 DIAGNOSIS — R05 Cough: Secondary | ICD-10-CM | POA: Diagnosis not present

## 2019-10-30 DIAGNOSIS — J069 Acute upper respiratory infection, unspecified: Secondary | ICD-10-CM | POA: Diagnosis not present

## 2019-11-01 ENCOUNTER — Other Ambulatory Visit: Payer: Self-pay

## 2019-11-01 ENCOUNTER — Ambulatory Visit (INDEPENDENT_AMBULATORY_CARE_PROVIDER_SITE_OTHER): Payer: Medicare Other | Admitting: Urology

## 2019-11-01 ENCOUNTER — Encounter: Payer: Self-pay | Admitting: Urology

## 2019-11-01 VITALS — BP 139/71 | HR 71 | Temp 97.9°F | Ht 70.0 in | Wt 183.0 lb

## 2019-11-01 DIAGNOSIS — R339 Retention of urine, unspecified: Secondary | ICD-10-CM | POA: Insufficient documentation

## 2019-11-01 DIAGNOSIS — N401 Enlarged prostate with lower urinary tract symptoms: Secondary | ICD-10-CM

## 2019-11-01 LAB — BLADDER SCAN AMB NON-IMAGING: Scan Result: 168.2

## 2019-11-01 NOTE — Progress Notes (Signed)

## 2019-11-01 NOTE — Patient Instructions (Signed)

## 2019-11-01 NOTE — Progress Notes (Signed)
11/01/2019 11:12 AM   Christopher Conner 14-Jun-1950 [id number]  Referring provider: Merrilee Seashore, [address] [place],  [place] [id number]  Urinary frequency  HPI: [name] is a 69yo here for followup for BPH and urinary retention. He underwent Urolift 3 weeks ago and after foley was removed he noted worsening urinary frequency, nocturia 3-4x. He is on decongestants for the past 2 days. His urine volumes are lower volumes. He notes the urgency and urge incontinence was worse after foley removal which is now improving.    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    Surgical History: Past Surgical History:  Procedure Laterality Date   APPENDECTOMY  early 20's   BACK SURGERY     CATARACT EXTRACTION Bilateral    left eye prostheisi "shell"   CYST REMOVAL NECK  yrs ago   [place]  april/march 2020   done in office   [place] [name] 10/12/2019   Procedure: CYSTOSCOPY WITH INSERTION OF UROLIFT;  Surgeon: Cleon Gustin, MD;  Location: [facility];  Service: Urology;  Laterality: [name];   LUMBAR FUSION  1998 and 2000   l5 to s1 1998, then l4 and l5 in 2000   VENTRICULOPERITONEAL SHUNT  2006    Home Medications:  Allergies as of 11/01/2019      Reactions   Latex Itching, Rash      Medication List       Accurate as of November 01, 2019 11:12 AM. If you have any questions, ask your nurse or doctor.        amLODipine 10 MG tablet Commonly known as: NORVASC every evening.   aspirin 81 MG tablet Take 81 mg by mouth daily.   atorvastatin 40  MG tablet Commonly known as: LIPITOR Take 40 mg by mouth every evening.   azithromycin 250 MG tablet Commonly known as: ZITHROMAX Take 250 mg by mouth as directed.   brompheniramine-pseudoephedrine-DM 30-2-10 MG/5ML syrup Take 5 mLs by mouth 3 (three) times daily as needed.   Contour Next Test test strip Generic drug: glucose blood 2 (two) times daily.   fluticasone 50 MCG/ACT nasal spray Commonly known as: FLONASE Place 2 sprays into both nostrils daily.   lisinopril-hydrochlorothiazide 20-12.5 MG tablet Commonly known as: ZESTORETIC Take 1 tablet by mouth daily.   meclizine 25 MG tablet Commonly known as: ANTIVERT Take 1 tablet (25 mg total) by mouth 3 (three) times daily as needed for dizziness.   meloxicam 15 MG tablet Commonly known as: MOBIC Take 15 mg by mouth daily.   NovoLOG 100 UNIT/ML injection Generic drug: insulin aspart INJECT UP TO 100 UNITS DAILY MAXIMUM DOSE WITH INSULIN PUMP DAILY   ondansetron 8 MG disintegrating tablet Commonly known as: Zofran ODT 8mg  ODT q6 hours prn nausea   OVER THE COUNTER MEDICATION Omega 369 1 bid   OVER THE COUNTER MEDICATION Vitamin c 1000 mg daily   OVER THE COUNTER MEDICATION Vitamin d 3 2000 iu daily   OVER THE COUNTER MEDICATION probiotiotic qday   OVER THE  COUNTER MEDICATION Methyl folate 1000 mg qd   OVER THE COUNTER MEDICATION Green tea extract 400 mg qd   OVER THE COUNTER MEDICATION Astragalis root 470 mg daily   OVER THE COUNTER MEDICATION Grape seed extract 200 mg qd   OVER THE COUNTER MEDICATION Alpha lipoic acid 600mg  qd   OVER THE COUNTER MEDICATION Acetyl l-caritine 500 gm qd   OVER THE COUNTER MEDICATION reservetrol 50 mg qd   OVER THE COUNTER MEDICATION coq10 120 mg qd   OVER THE COUNTER MEDICATION b complex 100 mg qd   OVER THE COUNTER MEDICATION Calcium 630 mg qd   REFRESH OP Apply 1-2 drops to eye every 3 (three) hours as needed (dryness).   tiZANidine 2 MG  tablet Commonly known as: ZANAFLEX Take 2 mg by mouth 3 (three) times daily as needed.   traMADol 50 MG tablet Commonly known as: Ultram Take 1 tablet (50 mg total) by mouth every 6 (six) hours as needed.       Allergies:  Allergies  Allergen Reactions   Latex Itching and Rash    Family History: Family History  Problem Relation Age of Onset   Hypertension Mother    Hyperlipidemia Mother    Diabetes Mother    Diabetes Father     Social History:  reports that he has never smoked. He has never used smokeless tobacco. He reports that he does not drink alcohol and does not use drugs.  ROS: All other review of systems were reviewed and are negative except what is noted above in HPI  Physical Exam: BP 139/71    Pulse 71    Temp 97.9 F (36.6 C)    Ht 5\' 10"  (1.778 m)    Wt 183 lb (83 kg)    BMI 26.26 kg/m   Constitutional:  Alert and oriented, No acute distress. HEENT: [place] AT, moist mucus membranes.  Trachea midline, no masses. Cardiovascular: No clubbing, cyanosis, or edema. Respiratory: Normal respiratory effort, no increased work of breathing. GI: Abdomen is soft, nontender, nondistended, no abdominal masses GU: No CVA tenderness.  Lymph: No cervical or inguinal lymphadenopathy. Skin: No rashes, bruises or suspicious lesions. Neurologic: Grossly intact, no focal deficits, moving all 4 extremities. Psychiatric: Normal mood and affect.  Laboratory Data: Lab Results  Component Value Date   WBC 20.5 (H) 08/17/2019   HGB 12.9 (L) 10/12/2019   HCT 38.0 (L) 10/12/2019   MCV 99.0 08/17/2019   PLT 309 08/17/2019    Lab Results  Component Value Date   CREATININE 0.90 10/12/2019    No results found for: PSA  No results found for: TESTOSTERONE  Lab Results  Component Value Date   HGBA1C 6.7 (H) 04/10/2014    Urinalysis    Component Value Date/Time   COLORURINE YELLOW 08/16/2015 0244   APPEARANCEUR CLEAR 08/16/2015 0244   LABSPEC 1.029 08/16/2015 0244    PHURINE 5.5 08/16/2015 0244   GLUCOSEU >1000 (A) 08/16/2015 0244   HGBUR NEGATIVE 08/16/2015 0244   BILIRUBINUR NEGATIVE 08/16/2015 0244   KETONESUR >80 (A) 08/16/2015 0244   PROTEINUR NEGATIVE 08/16/2015 0244   UROBILINOGEN 0.2 04/09/2014 1947   NITRITE NEGATIVE 08/16/2015 0244   LEUKOCYTESUR NEGATIVE 08/16/2015 0244    Lab Results  Component Value Date   BACTERIA RARE (A) 08/16/2015    Pertinent Imaging:  Results for orders placed during the hospital encounter of 05/23/10  DG Abd 1 View  Narrative *RADIOLOGY REPORT*  Clinical Data: Weakness with nausea and vomiting.  ABDOMEN -  1 VIEW  Comparison: 02/10/2010.  Findings: Ventriculoperitoneal shunt tubing extends into the right mid abdomen.  There is no shunt discontinuity.  Moderate stool is noted throughout the colon.  The bowel gas pattern is nonobstructive.  There are postsurgical changes of the lower lumbar spine status post fusion.  IMPRESSION:  1.  Moderate stool throughout the colon suggesting constipation. 2.  Intact ventricular peritoneal shunt.  Original Report Authenticated By: Vivia Ewing, M.D.  No results found for this or any previous visit.  No results found for this or any previous visit.  No results found for this or any previous visit.  No results found for this or any previous visit.  No results found for this or any previous visit.  No results found for this or any previous visit.  No results found for this or any previous visit.   Assessment & Plan:    1. Benign localized prostatic hyperplasia with lower urinary tract symptoms (LUTS) -We discussed the management of the urinary urgency including beta3 therapy. We will trial gemtesa 75mg  - BLADDER SCAN AMB NON-IMAGING  2. Urine retention -resolved   No follow-ups on file.  Nicolette Bang, MD  [facility] [place]

## 2019-12-11 ENCOUNTER — Ambulatory Visit: Payer: Medicare Other | Admitting: Urology

## 2020-01-03 ENCOUNTER — Other Ambulatory Visit: Payer: Self-pay

## 2020-01-03 ENCOUNTER — Ambulatory Visit (INDEPENDENT_AMBULATORY_CARE_PROVIDER_SITE_OTHER): Payer: Medicare Other | Admitting: Urology

## 2020-01-03 ENCOUNTER — Encounter: Payer: Self-pay | Admitting: Urology

## 2020-01-03 VITALS — BP 102/57 | HR 68 | Temp 98.1°F | Ht 70.0 in | Wt 183.0 lb

## 2020-01-03 DIAGNOSIS — R339 Retention of urine, unspecified: Secondary | ICD-10-CM | POA: Diagnosis not present

## 2020-01-03 DIAGNOSIS — R350 Frequency of micturition: Secondary | ICD-10-CM | POA: Diagnosis not present

## 2020-01-03 DIAGNOSIS — N401 Enlarged prostate with lower urinary tract symptoms: Secondary | ICD-10-CM | POA: Insufficient documentation

## 2020-01-03 LAB — BLADDER SCAN AMB NON-IMAGING: Scan Result: 46

## 2020-01-03 LAB — URINALYSIS, ROUTINE W REFLEX MICROSCOPIC
Bilirubin, UA: NEGATIVE
Glucose, UA: NEGATIVE
Leukocytes,UA: NEGATIVE
Nitrite, UA: NEGATIVE
Protein,UA: NEGATIVE
RBC, UA: NEGATIVE
Specific Gravity, UA: 1.02 (ref 1.005–1.030)
Urobilinogen, Ur: 0.2 mg/dL (ref 0.2–1.0)
pH, UA: 5.5 (ref 5.0–7.5)

## 2020-01-03 NOTE — Progress Notes (Signed)
PVR= 46   Urological Symptom Review  Patient is experiencing the following symptoms: none   Review of Systems  Gastrointestinal (upper)  : Negative for upper GI symptoms  Gastrointestinal (lower) : Negative for lower GI symptoms  Constitutional : Negative for symptoms  Skin: Negative for skin symptoms  Eyes: Negative for eye symptoms  Ear/Nose/Throat : Negative for Ear/Nose/Throat symptoms  Hematologic/Lymphatic: Negative for Hematologic/Lymphatic symptoms  Cardiovascular : Negative for cardiovascular symptoms  Respiratory : Negative for respiratory symptoms  Endocrine: Negative for endocrine symptoms  Musculoskeletal: Negative for musculoskeletal symptoms  Neurological: Negative for neurological symptoms  Psychologic: Negative for psychiatric symptoms

## 2020-01-03 NOTE — Patient Instructions (Signed)

## 2020-01-03 NOTE — Progress Notes (Signed)
01/03/2020 3:45 PM   Christopher Conner 1950/12/06 [id number]  Referring provider: Merrilee Conner, [address] [place],  [place] [id number]  Followup BPH  HPI: Christopher Conner is a 69yo here for followup for BPH, urinary retention. PVR 46cc. Since last visit the urinary frequency and urgency improved significantly. He has morning frequency after a cup of coffee. No dysuria. No hematuria. Stream good.    PMH: Past Medical History:  Diagnosis Date  . Arthritis   . BPH (benign prostatic hyperplasia)   . Chronic lymphocytic leukemia ([place]) early 2018   no current treatment  . CLL (chronic lymphocytic leukemia) ([place])   . DM type 1 (diabetes mellitus, type 1) (HCC)    type 1  . High cholesterol   . Hypertension   . 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 APPENDECTOMY  early 20's  . BACK SURGERY    . CATARACT EXTRACTION Bilateral    left eye prostheisi "shell"  . CYST REMOVAL NECK  yrs ago  . CYSTOSCOPY WITH INSERTION OF UROLIFT  april/march 2020   done in office  . CYSTOSCOPY WITH INSERTION OF UROLIFT [name] 10/12/2019   Procedure: CYSTOSCOPY WITH INSERTION OF UROLIFT;  Surgeon: Christopher Gustin, MD;  Location: [facility];  Service: Urology;  Laterality: [name];  . [place] and 2000   l5 to s1 1998, then l4 and l5 in 2000  . VENTRICULOPERITONEAL SHUNT  2006    Home Medications:  Allergies as of 01/03/2020      Reactions   Latex Itching, Rash      Medication List       Accurate as of January 03, 2020  3:45 PM. If you have any questions, ask your nurse or doctor.        amLODipine 10 MG tablet Commonly known as: NORVASC every evening.   aspirin 81 MG tablet Take 81 mg by mouth daily.   atorvastatin 40 MG tablet Commonly known as: LIPITOR Take 40 mg by mouth every evening.   azithromycin 250 MG tablet Commonly known as:  ZITHROMAX Take 250 mg by mouth as directed.   brompheniramine-pseudoephedrine-DM 30-2-10 MG/5ML syrup Take 5 mLs by mouth 3 (three) times daily as needed.   Contour Next Test test strip Generic drug: glucose blood 2 (two) times daily.   fluticasone 50 MCG/ACT nasal spray Commonly known as: FLONASE Place 2 sprays into both nostrils daily.   lisinopril-hydrochlorothiazide 20-12.5 MG tablet Commonly known as: ZESTORETIC Take 1 tablet by mouth daily.   meclizine 25 MG tablet Commonly known as: ANTIVERT Take 1 tablet (25 mg total) by mouth 3 (three) times daily as needed for dizziness.   meloxicam 15 MG tablet Commonly known as: MOBIC Take 15 mg by mouth daily.   NovoLOG 100 UNIT/ML injection Generic drug: insulin aspart INJECT UP TO 100 UNITS DAILY MAXIMUM DOSE WITH INSULIN PUMP DAILY   ondansetron 8 MG disintegrating tablet Commonly known as: Zofran ODT 8mg  ODT q6 hours prn nausea   OVER THE COUNTER MEDICATION Omega 369 1 bid   OVER THE COUNTER MEDICATION Vitamin c 1000 mg daily   OVER THE COUNTER MEDICATION Vitamin d 3 2000 iu daily   OVER THE COUNTER MEDICATION probiotiotic qday   OVER THE COUNTER MEDICATION Methyl folate 1000 mg qd   OVER THE COUNTER MEDICATION Green tea extract 400 mg qd   OVER  THE COUNTER MEDICATION Astragalis root 470 mg daily   OVER THE COUNTER MEDICATION Grape seed extract 200 mg qd   OVER THE COUNTER MEDICATION Alpha lipoic acid 600mg  qd   OVER THE COUNTER MEDICATION Acetyl l-caritine 500 gm qd   OVER THE COUNTER MEDICATION reservetrol 50 mg qd   OVER THE COUNTER MEDICATION coq10 120 mg qd   OVER THE COUNTER MEDICATION b complex 100 mg qd   OVER THE COUNTER MEDICATION Calcium 630 mg qd   REFRESH OP Apply 1-2 drops to eye every 3 (three) hours as needed (dryness).   tiZANidine 2 MG tablet Commonly known as: ZANAFLEX Take 2 mg by mouth 3 (three) times daily as needed.   traMADol 50 MG tablet Commonly known as:  Ultram Take 1 tablet (50 mg total) by mouth every 6 (six) hours as needed.       Allergies:  Allergies  Allergen Reactions  . Latex Itching and Rash    Family History: Family History  Problem Relation Age of Onset  . Hypertension Mother   . Hyperlipidemia Mother   . Diabetes Mother   . Diabetes Father     Social History:  reports that he has never smoked. He has never used smokeless tobacco. He reports that he does not drink alcohol and does not use drugs.  ROS: All other review of systems were reviewed and are negative except what is noted above in HPI  Physical Exam: BP (!) 102/57   Pulse 68   Temp 98.1 F (36.7 C)   Ht 5\' 10"  (1.778 m)   Wt 183 lb (83 kg)   BMI 26.26 kg/m   Constitutional:  Alert and oriented, No acute distress. HEENT: [place] AT, moist mucus membranes.  Trachea midline, no masses. Cardiovascular: No clubbing, cyanosis, or edema. Respiratory: Normal respiratory effort, no increased work of breathing. GI: Abdomen is soft, nontender, nondistended, no abdominal masses GU: No CVA tenderness.  Lymph: No cervical or inguinal lymphadenopathy. Skin: No rashes, bruises or suspicious lesions. Neurologic: Grossly intact, no focal deficits, moving all 4 extremities. Psychiatric: Normal mood and affect.  Laboratory Data: Lab Results  Component Value Date   WBC 20.5 (H) 08/17/2019   HGB 12.9 (L) 10/12/2019   HCT 38.0 (L) 10/12/2019   MCV 99.0 08/17/2019   PLT 309 08/17/2019    Lab Results  Component Value Date   CREATININE 0.90 10/12/2019    No results found for: PSA  No results found for: TESTOSTERONE  Lab Results  Component Value Date   HGBA1C 6.7 (H) 04/10/2014    Urinalysis    Component Value Date/Time   COLORURINE YELLOW 08/16/2015 0244   APPEARANCEUR CLEAR 08/16/2015 0244   LABSPEC 1.029 08/16/2015 0244   PHURINE 5.5 08/16/2015 0244   GLUCOSEU >1000 (A) 08/16/2015 0244   HGBUR NEGATIVE 08/16/2015 0244   BILIRUBINUR NEGATIVE  08/16/2015 0244   KETONESUR >80 (A) 08/16/2015 0244   PROTEINUR NEGATIVE 08/16/2015 0244   UROBILINOGEN 0.2 04/09/2014 1947   NITRITE NEGATIVE 08/16/2015 0244   LEUKOCYTESUR NEGATIVE 08/16/2015 0244    Lab Results  Component Value Date   BACTERIA RARE (A) 08/16/2015    Pertinent Imaging:  Results for orders placed during the hospital encounter of 05/23/10  DG Abd 1 View  Narrative *RADIOLOGY REPORT*  Clinical Data: Weakness with nausea and vomiting.  ABDOMEN - 1 VIEW  Comparison: 02/10/2010.  Findings: Ventriculoperitoneal shunt tubing extends into the right mid abdomen.  There is no shunt discontinuity.  Moderate stool is  noted throughout the colon.  The bowel gas pattern is nonobstructive.  There are postsurgical changes of the lower lumbar spine status post fusion.  IMPRESSION:  1.  Moderate stool throughout the colon suggesting constipation. 2.  Intact ventricular peritoneal shunt.  Original Report Authenticated By: Vivia Ewing, M.D.  No results found for this or any previous visit.  No results found for this or any previous visit.  No results found for this or any previous visit.  No results found for this or any previous visit.  No results found for this or any previous visit.  No results found for this or any previous visit.  No results found for this or any previous visit.   Assessment & Plan:    1. Urinary retention -resolved - BLADDER SCAN AMB NON-IMAGING - Urinalysis, Routine w reflex microscopic  2. Benign localized prostatic hyperplasia with lower urinary tract symptoms (LUTS) -Continue observation  3. Urinary frequency -decrease caffeine intake.   No follow-ups on file.  Nicolette Bang, MD  [facility] [place]

## 2020-01-15 DIAGNOSIS — Z872 Personal history of diseases of the skin and subcutaneous tissue: Secondary | ICD-10-CM | POA: Diagnosis not present

## 2020-01-15 DIAGNOSIS — D225 Melanocytic nevi of trunk: Secondary | ICD-10-CM | POA: Diagnosis not present

## 2020-01-15 DIAGNOSIS — L570 Actinic keratosis: Secondary | ICD-10-CM | POA: Diagnosis not present

## 2020-01-15 DIAGNOSIS — B353 Tinea pedis: Secondary | ICD-10-CM | POA: Diagnosis not present

## 2020-01-15 DIAGNOSIS — D1801 Hemangioma of skin and subcutaneous tissue: Secondary | ICD-10-CM | POA: Diagnosis not present

## 2020-01-15 DIAGNOSIS — L821 Other seborrheic keratosis: Secondary | ICD-10-CM | POA: Diagnosis not present

## 2020-01-15 DIAGNOSIS — Z85828 Personal history of other malignant neoplasm of skin: Secondary | ICD-10-CM | POA: Diagnosis not present

## 2020-01-15 DIAGNOSIS — L814 Other melanin hyperpigmentation: Secondary | ICD-10-CM | POA: Diagnosis not present

## 2020-01-15 DIAGNOSIS — L578 Other skin changes due to chronic exposure to nonionizing radiation: Secondary | ICD-10-CM | POA: Diagnosis not present

## 2020-01-15 DIAGNOSIS — L905 Scar conditions and fibrosis of skin: Secondary | ICD-10-CM | POA: Diagnosis not present

## 2020-01-23 ENCOUNTER — Encounter: Payer: Self-pay | Admitting: Podiatry

## 2020-01-23 ENCOUNTER — Ambulatory Visit (INDEPENDENT_AMBULATORY_CARE_PROVIDER_SITE_OTHER): Payer: Medicare Other | Admitting: Podiatry

## 2020-01-23 ENCOUNTER — Other Ambulatory Visit: Payer: Self-pay

## 2020-01-23 DIAGNOSIS — M21372 Foot drop, left foot: Secondary | ICD-10-CM

## 2020-01-23 DIAGNOSIS — B351 Tinea unguium: Secondary | ICD-10-CM | POA: Diagnosis not present

## 2020-01-23 DIAGNOSIS — M21371 Foot drop, right foot: Secondary | ICD-10-CM

## 2020-01-23 DIAGNOSIS — M79676 Pain in unspecified toe(s): Secondary | ICD-10-CM

## 2020-01-23 DIAGNOSIS — R269 Unspecified abnormalities of gait and mobility: Secondary | ICD-10-CM

## 2020-01-23 DIAGNOSIS — B353 Tinea pedis: Secondary | ICD-10-CM

## 2020-01-23 MED ORDER — KETOCONAZOLE 2 % EX CREA: TOPICAL_CREAM | CUTANEOUS | 0 refills | Status: DC

## 2020-01-23 NOTE — Progress Notes (Signed)
  Subjective:  Patient ID: Christopher Conner, male    DOB: 1950/06/06,  MRN: [id number]  Chief Complaint  Patient presents with  . Nail Problem    Pt wants nails removed on Bilateral feet     69 y.o. male presents with the above complaint. History confirmed with patient.  Has difficulty with the nails due to nail thickening and inability to trim them herself secondary to low back issues.  Has had removal of several of the toenails previously.  Additional concerns discussed today include dropfoot bilaterally.  He wears a dropfoot brace (originally stated to the right foot but believes it is to the left) which does help a little bit.  He has completed physical therapy and did not find much improvement of this.  He is doing exercises on his own at the gym which she thinks helped strengthen his feet more than physical therapy did.  Objective:  Physical Exam: warm, good capillary refill, no trophic changes or ulcerative lesions, normal DP and PT pulses and reduced normal sensation noted.  Evidence of removal of the right first and second left first second and third toenails.  Residual spicules noted to the second third toenails left foot.  All remaining nails dystrophic.  Xerosis with scaling present to the plantar foot bilateral right greater than left Bilateral dropfoot left greater than right.  Good Achilles strength and inversion noted.  Assessment:   1. Bilateral foot-drop   2. Gait disturbance   3. Onychomycosis   4. Pain around toenail   5. Tinea pedis of both feet    Plan:  Patient was evaluated and treated and all questions answered.  Dropfoot bilaterally -Discussed with patient the possibility of performing a tendon transfer to alleviate dropfoot.  I think he is a good candidate for this. Patient states he has interested in such a procedure as the dropfoot is becoming an issue that is not resolved with physical therapy.  I discussed the procedure at length including postoperative  outcome and postoperative course.  Discussed possible decreased muscle strength secondary to transfer.  Discussed performing EMG prior to transfer to ensure adequate targets for tendon transfer.  Patient to research more on his own will discuss further next visit  Onychomycosis and Onychodystrophy -Patient desires removal of the remaining toenails.  We can plan for removing the right third fourth and fifth toenails this week.  Plan for the remaining toenails including spicules next week.  We will schedule the appointment for 730 this Friday for removal of the right third fourth fifth toenails.    Tinea pedis -Rx ketoconazole  No follow-ups on file.

## 2020-01-24 ENCOUNTER — Encounter: Payer: Self-pay | Admitting: Podiatry

## 2020-01-26 ENCOUNTER — Ambulatory Visit (INDEPENDENT_AMBULATORY_CARE_PROVIDER_SITE_OTHER): Payer: Medicare Other | Admitting: Podiatry

## 2020-01-26 ENCOUNTER — Encounter: Payer: Self-pay | Admitting: Neurology

## 2020-01-26 ENCOUNTER — Other Ambulatory Visit: Payer: Self-pay

## 2020-01-26 ENCOUNTER — Encounter: Payer: Self-pay | Admitting: Podiatry

## 2020-01-26 DIAGNOSIS — M79676 Pain in unspecified toe(s): Secondary | ICD-10-CM

## 2020-01-26 DIAGNOSIS — M21372 Foot drop, left foot: Secondary | ICD-10-CM

## 2020-01-26 DIAGNOSIS — M21371 Foot drop, right foot: Secondary | ICD-10-CM | POA: Diagnosis not present

## 2020-01-26 DIAGNOSIS — L600 Ingrowing nail: Secondary | ICD-10-CM | POA: Diagnosis not present

## 2020-01-26 MED ORDER — NEOMYCIN-POLYMYXIN-HC 3.5-10000-1 OT SOLN: OTIC | 0 refills | Status: DC

## 2020-01-26 NOTE — Patient Instructions (Signed)
Place 1/4 cup of epsom salts in a quart of warm tap water.  Submerge your foot or feet in the solution and soak for 20 minutes.  This soak should be done twice a day.  Next, remove your foot or feet from solution, blot dry the affected area. Apply ointment and cover if instructed by your doctor.   IF YOUR SKIN BECOMES IRRITATED WHILE USING THESE INSTRUCTIONS, IT IS OKAY TO SWITCH TO  WHITE VINEGAR AND WATER.  As another alternative soak, you may use antibacterial soap and water.  Monitor for any signs/symptoms of infection. Call the office immediately if any occur or go directly to the emergency room. Call with any questions/concerns.  Ingrown Toenail An ingrown toenail occurs when the corner or sides of a toenail grow into the surrounding skin. This causes discomfort and pain. The big toe is most commonly affected, but any of the toes can be affected. If an ingrown toenail is not treated, it can become infected. What are the causes? This condition may be caused by:  Wearing shoes that are too small or tight.  An injury, such as stubbing your toe or having your toe stepped on.  Improper cutting or care of your toenails.  Having nail or foot abnormalities that were present from birth (congenital abnormalities), such as having a nail that is too big for your toe. What increases the risk? The following factors may make you more likely to develop ingrown toenails:  Age. Nails tend to get thicker with age, so ingrown nails are more common among older people.  Cutting your toenails incorrectly, such as cutting them very short or cutting them unevenly. An ingrown toenail is more likely to get infected if you have:  Diabetes.  Blood flow (circulation) problems. What are the signs or symptoms? Symptoms of an ingrown toenail may include:  Pain, soreness, or tenderness.  Redness.  Swelling.  Hardening of the skin that surrounds the toenail. Signs that an ingrown toenail may be infected  include:  Fluid or pus.  Symptoms that get worse instead of better. How is this diagnosed? An ingrown toenail may be diagnosed based on your medical history, your symptoms, and a physical exam. If you have fluid or blood coming from your toenail, a sample may be collected to test for the specific type of bacteria that is causing the infection. How is this treated? Treatment depends on how severe your ingrown toenail is. You may be able to care for your toenail at home.  If you have an infection, you may be prescribed antibiotic medicines.  If you have fluid or pus draining from your toenail, your health care provider may drain it.  If you have trouble walking, you may be given crutches to use.  If you have a severe or infected ingrown toenail, you may need a procedure to remove part or all of the nail. Follow these instructions at home: Foot care   Do not pick at your toenail or try to remove it yourself.  Soak your foot in warm, soapy water. Do this for 20 minutes, 3 times a day, or as often as told by your health care provider. This helps to keep your toe clean and keep your skin soft.  Wear shoes that fit well and are not too tight. Your health care provider may recommend that you wear open-toed shoes while you heal.  Trim your toenails regularly and carefully. Cut your toenails straight across to prevent injury to the skin at the   corners of the toenail. Do not cut your nails in a curved shape.  Keep your feet clean and dry to help prevent infection. Medicines  Take over-the-counter and prescription medicines only as told by your health care provider.  If you were prescribed an antibiotic, take it as told by your health care provider. Do not stop taking the antibiotic even if you start to feel better. Activity  Return to your normal activities as told by your health care provider. Ask your health care provider what activities are safe for you.  Avoid activities that cause  pain. General instructions  If your health care provider told you to use crutches to help you move around, use them as instructed.  Keep all follow-up visits as told by your health care provider. This is important. Contact a health care provider if:  You have more redness, swelling, pain, or other symptoms that do not improve with treatment.  You have fluid, blood, or pus coming from your toenail. Get help right away if:  You have a red streak on your skin that starts at your foot and spreads up your leg.  You have a fever. Summary  An ingrown toenail occurs when the corner or sides of a toenail grow into the surrounding skin. This causes discomfort and pain. The big toe is most commonly affected, but any of the toes can be affected.  If an ingrown toenail is not treated, it can become infected.  Fluid or pus draining from your toenail is a sign of infection. Your health care provider may need to drain it. You may be given antibiotics to treat the infection.  Trimming your toenails regularly and properly can help you prevent an ingrown toenail. This information is not intended to replace advice given to you by your health care provider. Make sure you discuss any questions you have with your health care provider. Document Revised: 07/29/2018 Document Reviewed: 12/23/2016 Elsevier Patient Education  2020 [employer]  

## 2020-01-26 NOTE — Progress Notes (Signed)
°  Subjective:  Patient ID: Christopher Conner, male    DOB: [birth year],  MRN: [id number]  Chief Complaint  Patient presents with   Nail Problem    removal of toenails right foot digits 3, 4 and 5     69 y.o. male presents with the above complaint. History confirmed with patient. Ready for toenail removal procedure today.  Also further discussion of dropfoot and possible surgery.  Objective:  Physical Exam: warm, good capillary refill, no trophic changes or ulcerative lesions, normal DP and PT pulses and normal sensory exam.  Dystrophic thickened nails right 3rd,4th,5th toes Bilateral foot drop, left worse than right Assessment:   1. Bilateral foot-drop   2. Ingrown nail   3. Pain around toenail      Plan:  Patient was evaluated and treated and all questions answered.  Dropfoot Bilateral -Discussed again procedure with patient including post-operative course and expectations for non-weightbearing including expected duration. -Order EMG/NCV. Referral to neurology and order placed.   Ingrown Nail, right -Patient elects to proceed with ingrown toenail removal today -Ingrown nail excised. See procedure note. -Educated on post-procedure care including soaking. Written instructions provided. -Rx for Cortisporin drops -Patient to follow up in 2 weeks for nail check.  Procedure: Excision of Toenail Location: Right 3rd toe, 4th toe and 5th toe  Anesthesia: Marcaine 0.5% plain; 52mL, digital block each toe Skin Prep: Betadine. Dressing: Silvadene; telfa; dry, sterile, compression dressing. Technique: Following skin prep, the toe was exsanguinated and a tourniquet was secured at the base of the toe. The affected nail was freed and avulsed. Chemical matrixectomy was then performed with phenol and irrigated out with alcohol. The tourniquet was then removed and sterile dressing applied. Disposition: Patient tolerated procedure well.

## 2020-02-02 ENCOUNTER — Other Ambulatory Visit: Payer: Self-pay

## 2020-02-02 ENCOUNTER — Ambulatory Visit (INDEPENDENT_AMBULATORY_CARE_PROVIDER_SITE_OTHER): Payer: Medicare Other | Admitting: Podiatry

## 2020-02-02 DIAGNOSIS — L600 Ingrowing nail: Secondary | ICD-10-CM | POA: Diagnosis not present

## 2020-02-02 NOTE — Patient Instructions (Signed)

## 2020-02-02 NOTE — Progress Notes (Signed)
  Subjective:  Patient ID: Christopher Conner, male    DOB: 12-09-1950,  MRN: [id number]  No chief complaint on file.   69 y.o. male presents with the above complaint. History confirmed with patient. Here for planned removal of toenails 2,3,4,5 left foot. Doing well from procedure on right foot.  Objective:  Physical Exam: warm, good capillary refill, no trophic changes or ulcerative lesions, normal DP and PT pulses and normal sensory exam.  Painful ingrowing nail at left 2nd/3rd/4th/5th toes with spicules of 2nd/3rd toes of the without warmth, erythema or drainage  Assessment:   1. Ingrown nail      Plan:  Patient was evaluated and treated and all questions answered.  Ingrown Nail, left -Patient elects to proceed with ingrown toenail removal today -Ingrown nail excised. See procedure note. -Educated on post-procedure care including soaking. Written instructions provided. -Rx for Cortisporin drops -Patient to follow up in 2 weeks for nail check.  Procedure: Excision of Ingrown Toenail Location: Left 2nd toe, 3rd toe, 4th toe and 5th toe total nail removals. Anesthesia: Lidocaine 1% plain; 7mL, digital block. Skin Prep: Betadine. Dressing: Silvadene; telfa; dry, sterile, compression dressing. Technique: Following skin prep, the toe was exsanguinated and a tourniquet was secured at the base of the toe. The affected nail was freed and excised. Chemical matrixectomy was then performed with phenol and irrigated out with alcohol. The tourniquet was then removed and sterile dressing applied. Disposition: Patient tolerated procedure well. Patient to return in 2 weeks for follow-up.  Return in about 2 weeks (around 02/16/2020).

## 2020-02-09 ENCOUNTER — Telehealth: Payer: Self-pay

## 2020-02-09 ENCOUNTER — Other Ambulatory Visit: Payer: Self-pay

## 2020-02-09 MED ORDER — DOXYCYCLINE HYCLATE 100 MG PO TABS: 100.0000 mg | ORAL_TABLET | Freq: Two times a day (BID) | ORAL | 0 refills | Status: DC

## 2020-02-09 NOTE — Telephone Encounter (Signed)
Pt called regarding message concerning possible infection following nail procedure. Pt was informed Dr. March Rummage was out of the office this week but the on call physician Dr. Posey Pronto would be sending over Doxycycline to treat possible infection. Pt educated on signs on infection (Spreading redness/fever) and to monitor himself with regular temperature checks. Pt states understanding and is agreeable to plan - will be picking up antibiotics. Pt also informed if condition worsens to contact the on call physician or go to the ED if increased/worsening infection. Pt also admits topical use of Ketoconazole may be causing redness but is unsure and at this moment has discontinued that medication.

## 2020-02-16 ENCOUNTER — Encounter: Payer: Self-pay | Admitting: Podiatry

## 2020-02-16 ENCOUNTER — Ambulatory Visit (INDEPENDENT_AMBULATORY_CARE_PROVIDER_SITE_OTHER): Payer: Medicare Other | Admitting: Podiatry

## 2020-02-16 ENCOUNTER — Other Ambulatory Visit: Payer: Self-pay

## 2020-02-16 DIAGNOSIS — M79676 Pain in unspecified toe(s): Secondary | ICD-10-CM | POA: Diagnosis not present

## 2020-02-16 DIAGNOSIS — M21372 Foot drop, left foot: Secondary | ICD-10-CM

## 2020-02-16 DIAGNOSIS — L600 Ingrowing nail: Secondary | ICD-10-CM

## 2020-02-16 DIAGNOSIS — M21371 Foot drop, right foot: Secondary | ICD-10-CM | POA: Diagnosis not present

## 2020-02-16 NOTE — Telephone Encounter (Signed)
Patient was seen today and issues addressed 

## 2020-02-16 NOTE — Progress Notes (Signed)
  Subjective:  Patient ID: Christopher Conner, male    DOB: 11/28/1950,  MRN: [id number]  Chief Complaint  Patient presents with  . Ingrown Toenail    f/u PERM NAIL REMOVAL X 4 LT FOOT    69 y.o. male presents for follow up of nail procedure. History confirmed with patient.   Objective:  Physical Exam: Ingrown nail avulsion sites: overlying soft crust, no warmth, no drainage and no erythema  Dropfoot bilat, L>R.  Assessment:   1. Ingrown nail   2. Bilateral foot-drop   3. Pain around toenail    Plan:  Patient was evaluated and treated and all questions answered.  S/p Ingrown Toenail Excision, bilateral -Healing well without issue. -Nail beds gently curettaged, dressed with neosporin and band-aids.  Dropfoot bilat -Ongoing surgical planning discussion. Pending EMG/NCVs. F/u after procedure for surgery planning

## 2020-02-22 DIAGNOSIS — E1021 Type 1 diabetes mellitus with diabetic nephropathy: Secondary | ICD-10-CM | POA: Diagnosis not present

## 2020-02-29 DIAGNOSIS — I10 Essential (primary) hypertension: Secondary | ICD-10-CM | POA: Diagnosis not present

## 2020-02-29 DIAGNOSIS — E7800 Pure hypercholesterolemia, unspecified: Secondary | ICD-10-CM | POA: Diagnosis not present

## 2020-02-29 DIAGNOSIS — Z23 Encounter for immunization: Secondary | ICD-10-CM | POA: Diagnosis not present

## 2020-02-29 DIAGNOSIS — Z9641 Presence of insulin pump (external) (internal): Secondary | ICD-10-CM | POA: Diagnosis not present

## 2020-02-29 DIAGNOSIS — E1021 Type 1 diabetes mellitus with diabetic nephropathy: Secondary | ICD-10-CM | POA: Diagnosis not present

## 2020-03-12 ENCOUNTER — Other Ambulatory Visit: Payer: Self-pay

## 2020-03-12 ENCOUNTER — Ambulatory Visit (INDEPENDENT_AMBULATORY_CARE_PROVIDER_SITE_OTHER): Payer: Medicare Other | Admitting: Neurology

## 2020-03-12 DIAGNOSIS — E1142 Type 2 diabetes mellitus with diabetic polyneuropathy: Secondary | ICD-10-CM

## 2020-03-12 DIAGNOSIS — M21372 Foot drop, left foot: Secondary | ICD-10-CM | POA: Diagnosis not present

## 2020-03-12 DIAGNOSIS — M21371 Foot drop, right foot: Secondary | ICD-10-CM | POA: Diagnosis not present

## 2020-03-12 NOTE — Procedures (Signed)
[facility]  [place], [place]  [place], [place] [address] Tel: [phone] Fax:  [phone] Test Date:  03/12/2020  Patient: Christopher Conner DOB: 15-Jan-1951 Physician: Narda Amber, DO  Sex: Male Height: 5\' 10"  Ref Phys: Hardie Pulley, DPM  ID#: [id number]   Technician:    Patient Complaints: This is a 69 year old man referred for evaluation of 69 year old man referred for evaluation of bilateral foot drop.  NCV & EMG Findings: Extensive electrodiagnostic testing of the right lower extremity and additional studies of the left shows:  1. Bilateral sural and superficial peroneal sensory responses are absent. 2. Bilateral peroneal (EDB) and tibial motor responses are absent.  Bilateral peroneal motor responses at the tibialis anterior show reduced amplitude (L1.0, R2.0 mV). 3. Bilateral tibial H reflex studies are absent. 4. Chronic motor axonal loss changes are seen affecting all the muscles below the knee and rectus femoris muscles bilaterally, which is worse distally.  There is no evidence of accompanied active denervation.   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   ___________________________ Narda Amber, DO    Nerve Conduction Studies Anti Sensory Summary Table   Stim Site NR Peak (ms) Norm Peak (ms) P-T Amp (V) Norm P-T Amp  Left Sup Peroneal Anti Sensory (Ant Lat Mall)  32C  12 cm NR  <4.6  >3  Right Sup Peroneal Anti Sensory (Ant Lat Mall)  32C  12 cm NR  <4.6  >3  Left Sural Anti Sensory (Lat Mall)  32C  Calf NR  <4.6  >3  Right Sural Anti Sensory (Lat Mall)  32C  Calf NR  <4.6  >3   Motor Summary Table   Stim Site NR Onset (ms) Norm Onset (ms) O-P Amp (mV) Norm O-P Amp Site1 Site2 Delta-0 (ms) Dist (cm) Vel (m/s) Norm Vel (m/s)  Left Peroneal Motor (Ext Dig Brev)  32C  Ankle NR  <6.0  >2.5 B Fib Ankle  0.0  >40  B Fib NR     Poplt B  Fib  0.0  >40  Poplt NR            Right Peroneal Motor (Ext Dig Brev)  32C  Ankle NR  <6.0  >2.5 B Fib Ankle  0.0  >40  B Fib NR     Poplt B Fib  0.0  >40  Poplt NR            Left Peroneal TA Motor (Tib Ant)  32C  Fib Head    4.5 <4.5 1.0 >3 Poplit Fib Head 1.8 10.0 56 >40  Poplit    6.3  0.9         Right Peroneal TA Motor (Tib Ant)  32C  Fib Head    3.8 <4.5 2.0 >3 Poplit Fib Head 1.6 9.0 56 >40  Poplit    5.4  1.7         Left Tibial Motor (Abd Hall Brev)  32C  Ankle NR  <6.0  >4 Knee Ankle  0.0  >40  Knee NR            Right Tibial Motor (Abd Hall Brev)  32C  Ankle NR  <6.0  >4 Knee Ankle  0.0  >40  Knee NR             H Reflex Studies   NR H-Lat (ms) Lat Norm (ms) L-R H-Lat (ms)  Left Tibial (Gastroc)  32C  NR  <35   Right Tibial (Gastroc)  32C  NR  <35    EMG   Side Muscle Ins Act Fibs Psw Fasc Number Recrt Dur Dur. Amp Amp. Poly Poly. Comment  Right AntTibialis Nml Nml Nml Nml SMU Rapid All 1+ All 1+ All 1+ ATR  Right Gastroc Nml Nml Nml Nml 3- Rapid Most 1+ Many 1+ Many 1+ [name]  Right PostTibialis Nml Nml Nml Nml NE - - - - - - - [name]  Right Fibularis Long Nml Nml Nml Nml 2- Rapid Most 1+ Most 1+ Many 1+ [name]  Right RectFemoris Nml Nml Nml Nml 1- Rapid Some 1+ Some 1+ Some 1+ [name]  Right GluteusMed Nml Nml Nml Nml Nml Nml Nml Nml Nml Nml Nml Nml [name]  Right AdductorLong Nml Nml Nml Nml Nml Nml Nml Nml Nml Nml Nml Nml [name]  Left AntTibialis Nml Nml Nml Nml SMU Rapid All 1+ All 1+ All 1+ ATR  Left Gastroc Nml Nml Nml Nml 3- Rapid Many 1+ Many 1+ Many 1+ [name]  Left Fibularis Long Nml Nml Nml Nml 2- Rapid Some 1+ Some 1+ Some 1+ [name]  Left PostTibialis Nml Nml Nml Nml NE None - - - - - - [name]  Left RectFemoris Nml Nml Nml Nml 1- Rapid Some 1+ Some 1+ Some 1+ [name]  Left GluteusMed Nml Nml Nml Nml Nml Nml Nml Nml Nml Nml Nml Nml [name]      Waveforms:

## 2020-03-22 ENCOUNTER — Other Ambulatory Visit: Payer: Self-pay

## 2020-03-22 ENCOUNTER — Ambulatory Visit (INDEPENDENT_AMBULATORY_CARE_PROVIDER_SITE_OTHER): Payer: Medicare Other | Admitting: Podiatry

## 2020-03-22 DIAGNOSIS — Z91199 Patient's noncompliance with other medical treatment and regimen due to unspecified reason: Secondary | ICD-10-CM

## 2020-03-22 DIAGNOSIS — Z5329 Procedure and treatment not carried out because of patient's decision for other reasons: Secondary | ICD-10-CM

## 2020-03-22 NOTE — Progress Notes (Signed)
   Complete physical exam  Patient: Christopher Conner   DOB: 02/07/1999   69 y.o. Male  MRN: [id number]  Subjective:    No chief complaint on file.   Christopher Conner is a 69 y.o. male who presents today for a complete physical exam. She reports consuming a {diet types:17450} diet. {types:19826} She generally feels {DESC; WELL/FAIRLY WELL/POORLY:18703}. She reports sleeping {DESC; WELL/FAIRLY WELL/POORLY:18703}. She {does/does not:200015} have additional problems to discuss today.    Most recent fall risk assessment:    10/15/2021   10:42 AM  Fall Risk   Falls in the past year? 0  Number falls in past yr: 0  Injury with Fall? 0  Risk for fall due to : No Fall Risks  Follow up Falls evaluation completed     Most recent depression screenings:    10/15/2021   10:42 AM 09/05/2020   10:46 AM  PHQ 2/9 Scores  PHQ - 2 Score 0 0  PHQ- 9 Score 5     {VISON DENTAL STD PSA (Optional):27386}  {History (Optional):23778}  Patient Care Team: Jessup, Joy, NP as PCP - General (Nurse Practitioner)   Outpatient Medications Prior to Visit  Medication Sig   fluticasone (FLONASE) 50 MCG/ACT nasal spray Place 2 sprays into both nostrils in the morning and at bedtime. After 7 days, reduce to once daily.   norgestimate-ethinyl estradiol (SPRINTEC 28) 0.25-35 MG-MCG tablet Take 1 tablet by mouth daily.   Nystatin POWD Apply liberally to affected area 2 times per day   spironolactone (ALDACTONE) 100 MG tablet Take 1 tablet (100 mg total) by mouth daily.   No facility-administered medications prior to visit.    ROS        Objective:     There were no vitals taken for this visit. {Vitals History (Optional):23777}  Physical Exam   No results found for any visits on 11/20/21. {Show previous labs (optional):23779}    Assessment & Plan:    Routine Health Maintenance and Physical Exam  Immunization History  Administered Date(s) Administered   DTaP 04/23/1999, 06/19/1999,  08/28/1999, 05/13/2000, 11/27/2003   Hepatitis A 09/23/2007, 09/28/2008   Hepatitis B 02/08/1999, 03/18/1999, 08/28/1999   HiB (PRP-OMP) 04/23/1999, 06/19/1999, 08/28/1999, 05/13/2000   IPV 04/23/1999, 06/19/1999, 02/16/2000, 11/27/2003   Influenza,inj,Quad PF,6+ Mos 12/29/2013   Influenza-Unspecified 03/30/2012   MMR 02/15/2001, 11/27/2003   Meningococcal Polysaccharide 09/28/2011   Pneumococcal Conjugate-13 05/13/2000   Pneumococcal-Unspecified 08/28/1999, 11/11/1999   Tdap 09/28/2011   Varicella 02/16/2000, 09/23/2007    Health Maintenance  Topic Date Due   HIV Screening  Never done   Hepatitis C Screening  Never done   INFLUENZA VACCINE  11/18/2021   PAP-Cervical Cytology Screening  11/20/2021 (Originally 02/07/2020)   PAP SMEAR-Modifier  11/20/2021 (Originally 02/07/2020)   TETANUS/TDAP  11/20/2021 (Originally 09/27/2021)   HPV VACCINES  Discontinued   COVID-19 Vaccine  Discontinued    Discussed health benefits of physical activity, and encouraged her to engage in regular exercise appropriate for her age and condition.  Problem List Items Addressed This Visit   None Visit Diagnoses     Annual physical exam    -  Primary   Cervical cancer screening       Need for Tdap vaccination          No follow-ups on file.     Joy Jessup, NP   

## 2020-04-05 DIAGNOSIS — G912 (Idiopathic) normal pressure hydrocephalus: Secondary | ICD-10-CM | POA: Diagnosis not present

## 2020-04-05 DIAGNOSIS — I10 Essential (primary) hypertension: Secondary | ICD-10-CM | POA: Diagnosis not present

## 2020-04-05 DIAGNOSIS — Z125 Encounter for screening for malignant neoplasm of prostate: Secondary | ICD-10-CM | POA: Diagnosis not present

## 2020-04-05 DIAGNOSIS — Z0000 Encounter for general adult medical examination without abnormal findings: Secondary | ICD-10-CM | POA: Diagnosis not present

## 2020-04-05 DIAGNOSIS — I700 Atherosclerosis of aorta: Secondary | ICD-10-CM | POA: Diagnosis not present

## 2020-04-05 DIAGNOSIS — E1021 Type 1 diabetes mellitus with diabetic nephropathy: Secondary | ICD-10-CM | POA: Diagnosis not present

## 2020-04-05 DIAGNOSIS — Z982 Presence of cerebrospinal fluid drainage device: Secondary | ICD-10-CM | POA: Diagnosis not present

## 2020-04-05 DIAGNOSIS — C9190 Lymphoid leukemia, unspecified not having achieved remission: Secondary | ICD-10-CM | POA: Diagnosis not present

## 2020-04-05 DIAGNOSIS — E7800 Pure hypercholesterolemia, unspecified: Secondary | ICD-10-CM | POA: Diagnosis not present

## 2020-04-10 DIAGNOSIS — E7800 Pure hypercholesterolemia, unspecified: Secondary | ICD-10-CM | POA: Diagnosis not present

## 2020-04-10 DIAGNOSIS — F5101 Primary insomnia: Secondary | ICD-10-CM | POA: Diagnosis not present

## 2020-04-10 DIAGNOSIS — G912 (Idiopathic) normal pressure hydrocephalus: Secondary | ICD-10-CM | POA: Diagnosis not present

## 2020-04-10 DIAGNOSIS — I700 Atherosclerosis of aorta: Secondary | ICD-10-CM | POA: Diagnosis not present

## 2020-04-10 DIAGNOSIS — I10 Essential (primary) hypertension: Secondary | ICD-10-CM | POA: Diagnosis not present

## 2020-04-10 DIAGNOSIS — M21371 Foot drop, right foot: Secondary | ICD-10-CM | POA: Diagnosis not present

## 2020-04-10 DIAGNOSIS — E1021 Type 1 diabetes mellitus with diabetic nephropathy: Secondary | ICD-10-CM | POA: Diagnosis not present

## 2020-04-10 DIAGNOSIS — M21372 Foot drop, left foot: Secondary | ICD-10-CM | POA: Diagnosis not present

## 2020-04-10 DIAGNOSIS — Z9641 Presence of insulin pump (external) (internal): Secondary | ICD-10-CM | POA: Diagnosis not present

## 2020-04-10 DIAGNOSIS — C9190 Lymphoid leukemia, unspecified not having achieved remission: Secondary | ICD-10-CM | POA: Diagnosis not present

## 2020-04-26 DIAGNOSIS — Z20822 Contact with and (suspected) exposure to covid-19: Secondary | ICD-10-CM | POA: Diagnosis not present

## 2020-05-27 DIAGNOSIS — R21 Rash and other nonspecific skin eruption: Secondary | ICD-10-CM | POA: Diagnosis not present

## 2020-05-28 DIAGNOSIS — L308 Other specified dermatitis: Secondary | ICD-10-CM | POA: Diagnosis not present

## 2020-06-03 DIAGNOSIS — Z961 Presence of intraocular lens: Secondary | ICD-10-CM | POA: Diagnosis not present

## 2020-06-03 DIAGNOSIS — E103593 Type 1 diabetes mellitus with proliferative diabetic retinopathy without macular edema, bilateral: Secondary | ICD-10-CM | POA: Diagnosis not present

## 2020-06-03 DIAGNOSIS — H52203 Unspecified astigmatism, bilateral: Secondary | ICD-10-CM | POA: Diagnosis not present

## 2020-06-10 DIAGNOSIS — L570 Actinic keratosis: Secondary | ICD-10-CM | POA: Diagnosis not present

## 2020-06-13 DIAGNOSIS — H53421 Scotoma of blind spot area, right eye: Secondary | ICD-10-CM | POA: Diagnosis not present

## 2020-06-13 DIAGNOSIS — H43391 Other vitreous opacities, right eye: Secondary | ICD-10-CM | POA: Diagnosis not present

## 2020-06-13 DIAGNOSIS — H33052 Total retinal detachment, left eye: Secondary | ICD-10-CM | POA: Diagnosis not present

## 2020-06-13 DIAGNOSIS — H44522 Atrophy of globe, left eye: Secondary | ICD-10-CM | POA: Diagnosis not present

## 2020-06-13 DIAGNOSIS — E103591 Type 1 diabetes mellitus with proliferative diabetic retinopathy without macular edema, right eye: Secondary | ICD-10-CM | POA: Diagnosis not present

## 2020-06-13 DIAGNOSIS — H3582 Retinal ischemia: Secondary | ICD-10-CM | POA: Diagnosis not present

## 2020-06-13 DIAGNOSIS — H5319 Other subjective visual disturbances: Secondary | ICD-10-CM | POA: Diagnosis not present

## 2020-06-13 DIAGNOSIS — H5703 Miosis: Secondary | ICD-10-CM | POA: Diagnosis not present

## 2020-06-14 DIAGNOSIS — H1131 Conjunctival hemorrhage, right eye: Secondary | ICD-10-CM | POA: Diagnosis not present

## 2020-06-17 DIAGNOSIS — E1021 Type 1 diabetes mellitus with diabetic nephropathy: Secondary | ICD-10-CM | POA: Diagnosis not present

## 2020-06-17 DIAGNOSIS — E7800 Pure hypercholesterolemia, unspecified: Secondary | ICD-10-CM | POA: Diagnosis not present

## 2020-06-17 DIAGNOSIS — I10 Essential (primary) hypertension: Secondary | ICD-10-CM | POA: Diagnosis not present

## 2020-06-18 DIAGNOSIS — E1021 Type 1 diabetes mellitus with diabetic nephropathy: Secondary | ICD-10-CM | POA: Diagnosis not present

## 2020-06-18 DIAGNOSIS — E7800 Pure hypercholesterolemia, unspecified: Secondary | ICD-10-CM | POA: Diagnosis not present

## 2020-06-18 DIAGNOSIS — I10 Essential (primary) hypertension: Secondary | ICD-10-CM | POA: Diagnosis not present

## 2020-06-25 DIAGNOSIS — L308 Other specified dermatitis: Secondary | ICD-10-CM | POA: Diagnosis not present

## 2020-07-03 ENCOUNTER — Other Ambulatory Visit: Payer: Self-pay

## 2020-07-03 ENCOUNTER — Encounter: Payer: Self-pay | Admitting: Urology

## 2020-07-03 ENCOUNTER — Ambulatory Visit (INDEPENDENT_AMBULATORY_CARE_PROVIDER_SITE_OTHER): Payer: Medicare Other | Admitting: Urology

## 2020-07-03 VITALS — BP 156/67 | HR 71 | Temp 97.6°F | Ht 70.0 in | Wt 173.0 lb

## 2020-07-03 DIAGNOSIS — R350 Frequency of micturition: Secondary | ICD-10-CM | POA: Diagnosis not present

## 2020-07-03 DIAGNOSIS — N401 Enlarged prostate with lower urinary tract symptoms: Secondary | ICD-10-CM

## 2020-07-03 DIAGNOSIS — I10 Essential (primary) hypertension: Secondary | ICD-10-CM | POA: Diagnosis not present

## 2020-07-03 DIAGNOSIS — R3915 Urgency of urination: Secondary | ICD-10-CM | POA: Diagnosis not present

## 2020-07-03 NOTE — Patient Instructions (Signed)

## 2020-07-03 NOTE — Progress Notes (Signed)
07/03/2020 3:49 PM   Christopher Conner 1950-12-11 [id number]  Referring provider: Merrilee Seashore, [address] [place],  [place] [address]  Followup BPH  HPI: Christopher Conner is a 70yo here for followup for BPH and urinary frequency. He has nocturia 3x which is large volume. He is being evaluated for OSA. He is taking trazadone for sleep but continues to have nocturia 3x. He has daytime frequency every 2-3 hours with associated urgency. No urge incontinence. Stream is strong.    PMH: Past Medical History:  Diagnosis Date  . Arthritis   . BPH (benign prostatic hyperplasia)   . Chronic lymphocytic leukemia ([place]) early 2018   no current treatment  . CLL (chronic lymphocytic leukemia) ([place])   . DM type 1 (diabetes mellitus, type 1) ([facility])    type 1  . High cholesterol   . Hypertension   . 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 APPENDECTOMY  early 20's  . BACK SURGERY    . CATARACT EXTRACTION Bilateral    left eye prostheisi "shell"  . CYST REMOVAL NECK  yrs ago  . CYSTOSCOPY WITH INSERTION OF UROLIFT  april/march 2020   done in office  . CYSTOSCOPY WITH INSERTION OF UROLIFT [name] 10/12/2019   Procedure: CYSTOSCOPY WITH INSERTION OF UROLIFT;  Surgeon: Cleon Gustin, MD;  Location: [facility];  Service: Urology;  Laterality: [name];  . [place] and 2000   l5 to s1 1998, then l4 and l5 in 2000  . VENTRICULOPERITONEAL SHUNT  2006    Home Medications:  Allergies as of 07/03/2020      Reactions   Latex Itching, Rash      Medication List       Accurate as of July 03, 2020  3:49 PM. If you have any questions, ask your nurse or doctor.        Acetyl L-Carnitine 250 MG Caps 1 capsule   Alpha-Lipoic Acid 600 MG Caps 1 capsule   amLODipine 10 MG tablet Commonly known as: NORVASC Take 1 tablet by mouth daily.   amLODipine  10 MG tablet Commonly known as: NORVASC every evening.   aspirin 81 MG tablet Take 81 mg by mouth daily.   Astragalus Root Powd See admin instructions.   atorvastatin 40 MG tablet Commonly known as: LIPITOR Take 40 mg by mouth every evening.   azithromycin 250 MG tablet Commonly known as: ZITHROMAX Take 250 mg by mouth as directed.   B-100 COMPLEX CR PO 1 tablet   brompheniramine-pseudoephedrine-DM 30-2-10 MG/5ML syrup Take 5 mLs by mouth 3 (three) times daily as needed.   Contour Next Test test strip Generic drug: glucose blood 2 (two) times daily.   CoQ-10 100 MG capsule See admin instructions.   doxycycline 100 MG tablet Commonly known as: VIBRA-TABS Take 1 tablet (100 mg total) by mouth 2 (two) times daily.   fluticasone 50 MCG/ACT nasal spray Commonly known as: FLONASE Place 2 sprays into both nostrils daily.   Green Tea 150 MG Caps See admin instructions.   ketoconazole 2 % cream Commonly known as: NIZORAL Apply 1 fingertip amount to each foot daily.   lisinopril-hydrochlorothiazide 20-25 MG tablet Commonly known as: ZESTORETIC Take 1 tablet by mouth daily.   meclizine 25 MG tablet Commonly known as: ANTIVERT Take 1 tablet (25 mg total) by mouth 3 (three) times daily as needed  for dizziness.   meloxicam 15 MG tablet Commonly known as: MOBIC Take 15 mg by mouth daily.   neomycin-polymyxin-hydrocortisone OTIC solution Commonly known as: CORTISPORIN Apply 2 drops to the ingrown toenail site twice daily. Cover with band-aid.   NovoLOG 100 UNIT/ML injection Generic drug: insulin aspart INJECT UP TO 100 UNITS DAILY MAXIMUM DOSE WITH INSULIN PUMP DAILY   OMEGA 3-6-9 FATTY ACIDS PO See admin instructions.   ondansetron 8 MG disintegrating tablet Commonly known as: Zofran ODT 8mg  ODT q6 hours prn nausea   OVER THE COUNTER MEDICATION Omega 369 1 bid   OVER THE COUNTER MEDICATION Vitamin c 1000 mg daily   OVER THE COUNTER MEDICATION Vitamin d  3 2000 iu daily   OVER THE COUNTER MEDICATION probiotiotic qday   OVER THE COUNTER MEDICATION Methyl folate 1000 mg qd   OVER THE COUNTER MEDICATION Green tea extract 400 mg qd   OVER THE COUNTER MEDICATION Astragalis root 470 mg daily   OVER THE COUNTER MEDICATION Grape seed extract 200 mg qd   OVER THE COUNTER MEDICATION Alpha lipoic acid 600mg  qd   OVER THE COUNTER MEDICATION Acetyl l-caritine 500 gm qd   OVER THE COUNTER MEDICATION reservetrol 50 mg qd   OVER THE COUNTER MEDICATION coq10 120 mg qd   OVER THE COUNTER MEDICATION b complex 100 mg qd   OVER THE COUNTER MEDICATION Calcium 630 mg qd   REFRESH OP Apply 1-2 drops to eye every 3 (three) hours as needed (dryness).   tiZANidine 2 MG tablet Commonly known as: ZANAFLEX Take 2 mg by mouth 3 (three) times daily as needed.   traMADol 50 MG tablet Commonly known as: Ultram Take 1 tablet (50 mg total) by mouth every 6 (six) hours as needed.   Vitamin C & D3/Rose Hips 737-1062-69 MG-UNIT-MG Caps Generic drug: Vit C-Cholecalciferol-Rose Hip See admin instructions.   Vitamin D 50 MCG (2000 UT) Caps 1 capsule       Allergies:  Allergies  Allergen Reactions  . Latex Itching and Rash    Family History: Family History  Problem Relation Age of Onset  . Hypertension Mother   . Hyperlipidemia Mother   . Diabetes Mother   . Diabetes Father     Social History:  reports that he has never smoked. He has never used smokeless tobacco. He reports that he does not drink alcohol and does not use drugs.  ROS: All other review of systems were reviewed and are negative except what is noted above in HPI  Physical Exam: BP (!) 156/67   Pulse 71   Temp 97.6 F (36.4 C)   Ht 5\' 10"  (1.778 m)   Wt 173 lb (78.5 kg)   BMI 24.82 kg/m   Constitutional:  Alert and oriented, No acute distress. HEENT: [place] AT, moist mucus membranes.  Trachea midline, no masses. Cardiovascular: No clubbing, cyanosis, or  edema. Respiratory: Normal respiratory effort, no increased work of breathing. GI: Abdomen is soft, nontender, nondistended, no abdominal masses GU: No CVA tenderness.  Lymph: No cervical or inguinal lymphadenopathy. Skin: No rashes, bruises or suspicious lesions. Neurologic: Grossly intact, no focal deficits, moving all 4 extremities. Psychiatric: Normal mood and affect.  Laboratory Data: Lab Results  Component Value Date   WBC 20.5 (H) 08/17/2019   HGB 12.9 (L) 10/12/2019   HCT 38.0 (L) 10/12/2019   MCV 99.0 08/17/2019   PLT 309 08/17/2019    Lab Results  Component Value Date   CREATININE 0.90 10/12/2019  No results found for: PSA  No results found for: TESTOSTERONE  Lab Results  Component Value Date   HGBA1C 6.7 (H) 04/10/2014    Urinalysis    Component Value Date/Time   COLORURINE YELLOW 08/16/2015 0244   APPEARANCEUR Clear 01/03/2020 1537   LABSPEC 1.029 08/16/2015 0244   PHURINE 5.5 08/16/2015 0244   GLUCOSEU Negative 01/03/2020 1537   HGBUR NEGATIVE 08/16/2015 0244   BILIRUBINUR Negative 01/03/2020 1537   KETONESUR >80 (A) 08/16/2015 0244   PROTEINUR Negative 01/03/2020 1537   PROTEINUR NEGATIVE 08/16/2015 0244   UROBILINOGEN 0.2 04/09/2014 1947   NITRITE Negative 01/03/2020 1537   NITRITE NEGATIVE 08/16/2015 0244   LEUKOCYTESUR Negative 01/03/2020 1537    Lab Results  Component Value Date   LABMICR Comment 01/03/2020   BACTERIA RARE (A) 08/16/2015    Pertinent Imaging:  Results for orders placed during the hospital encounter of 05/23/10  DG Abd 1 View  Narrative *RADIOLOGY REPORT*  Clinical Data: Weakness with nausea and vomiting.  ABDOMEN - 1 VIEW  Comparison: 02/10/2010.  Findings: Ventriculoperitoneal shunt tubing extends into the right mid abdomen.  There is no shunt discontinuity.  Moderate stool is noted throughout the colon.  The bowel gas pattern is nonobstructive.  There are postsurgical changes of the lower lumbar spine  status post fusion.  IMPRESSION:  1.  Moderate stool throughout the colon suggesting constipation. 2.  Intact ventricular peritoneal shunt.  Original Report Authenticated By: Vivia Ewing, M.D.  No results found for this or any previous visit.  No results found for this or any previous visit.  No results found for this or any previous visit.  No results found for this or any previous visit.  No results found for this or any previous visit.  No results found for this or any previous visit.  No results found for this or any previous visit.   Assessment & Plan:    1. Benign localized prostatic hyperplasia with lower urinary tract symptoms (LUTS) -we will start mirabegron 25mg  daily - Urinalysis, Routine w reflex microscopic  2. Urinary frequency -mirabegron 25mg  daily  3. Urinary urgency -Mirabegron 25mg  daily. RTC 4-6 weeks   No follow-ups on file.  Nicolette Bang, MD  [facility] [place]

## 2020-07-03 NOTE — Progress Notes (Signed)

## 2020-07-08 DIAGNOSIS — I10 Essential (primary) hypertension: Secondary | ICD-10-CM | POA: Diagnosis not present

## 2020-07-08 DIAGNOSIS — R29818 Other symptoms and signs involving the nervous system: Secondary | ICD-10-CM | POA: Diagnosis not present

## 2020-07-08 DIAGNOSIS — E871 Hypo-osmolality and hyponatremia: Secondary | ICD-10-CM | POA: Diagnosis not present

## 2020-07-08 DIAGNOSIS — I700 Atherosclerosis of aorta: Secondary | ICD-10-CM | POA: Diagnosis not present

## 2020-07-08 DIAGNOSIS — E1021 Type 1 diabetes mellitus with diabetic nephropathy: Secondary | ICD-10-CM | POA: Diagnosis not present

## 2020-07-18 DIAGNOSIS — E1021 Type 1 diabetes mellitus with diabetic nephropathy: Secondary | ICD-10-CM | POA: Diagnosis not present

## 2020-07-18 DIAGNOSIS — I10 Essential (primary) hypertension: Secondary | ICD-10-CM | POA: Diagnosis not present

## 2020-07-18 DIAGNOSIS — E7800 Pure hypercholesterolemia, unspecified: Secondary | ICD-10-CM | POA: Diagnosis not present

## 2020-07-23 DIAGNOSIS — L570 Actinic keratosis: Secondary | ICD-10-CM | POA: Diagnosis not present

## 2020-08-14 ENCOUNTER — Other Ambulatory Visit: Payer: Self-pay

## 2020-08-14 ENCOUNTER — Telehealth (INDEPENDENT_AMBULATORY_CARE_PROVIDER_SITE_OTHER): Payer: Medicare Other | Admitting: Urology

## 2020-08-14 ENCOUNTER — Encounter: Payer: Self-pay | Admitting: Urology

## 2020-08-14 DIAGNOSIS — R3915 Urgency of urination: Secondary | ICD-10-CM

## 2020-08-14 DIAGNOSIS — N401 Enlarged prostate with lower urinary tract symptoms: Secondary | ICD-10-CM | POA: Diagnosis not present

## 2020-08-14 MED ORDER — SOLIFENACIN SUCCINATE 5 MG PO TABS: 5.0000 mg | ORAL_TABLET | Freq: Every day | ORAL | 3 refills | Status: DC

## 2020-08-14 NOTE — Progress Notes (Signed)
08/14/2020 2:18 PM   Christopher Conner 04/30/1950 [id number]  Referring provider: Merrilee Seashore, [place] [address],  [place] [address]   Patient location: home Physician location: office I connected with  Christopher Conner on 08/27/20 by a video enabled telemedicine application and verified that I am speaking with the correct person using two identifiers.   I discussed the limitations of evaluation and management by telemedicine. The patient expressed understanding and agreed to proceed.    Urinary urgency  HPI: Christopher Conner is a 70yo here for followup for BPH, urinary urgency and urinary frequency. He started mirabegron 25mg  last visit which failed to improve his daytime frequency, urgency and nocturia. Urinary frequency every 2 hours. He stopped his diuretic which failed to improve his urinary frequency and urgency. He has also failed mirabegron.    PMH: Past Medical History:  Diagnosis Date  . Arthritis   . BPH (benign prostatic hyperplasia)   . Chronic lymphocytic leukemia ([facility]) early 2018   no current treatment  . CLL (chronic lymphocytic leukemia) ([facility])   . DM type 1 (diabetes mellitus, type 1) ([facility])    type 1  . High cholesterol   . Hypertension   . 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 APPENDECTOMY  early 20's  . BACK SURGERY    . CATARACT EXTRACTION Bilateral    left eye prostheisi "shell"  . CYST REMOVAL NECK  yrs ago  . CYSTOSCOPY WITH INSERTION OF UROLIFT  april/march 2020   done in office  . CYSTOSCOPY WITH INSERTION OF UROLIFT [name] 10/12/2019   Procedure: CYSTOSCOPY WITH INSERTION OF UROLIFT;  Surgeon: Cleon Gustin, MD;  Location: [facility];  Service: Urology;  Laterality: [name];  . [place] and 2000   l5 to s1 1998, then l4 and l5 in 2000  . VENTRICULOPERITONEAL SHUNT  2006    Home Medications:   Allergies as of 08/14/2020      Reactions   Latex Itching, Rash      Medication List       Accurate as of August 14, 2020  2:18 PM. If you have any questions, ask your nurse or doctor.        Acetyl L-Carnitine 250 MG Caps 1 capsule   Alpha-Lipoic Acid 600 MG Caps 1 capsule   amLODipine 10 MG tablet Commonly known as: NORVASC Take 1 tablet by mouth daily.   amLODipine 10 MG tablet Commonly known as: NORVASC every evening.   aspirin 81 MG tablet Take 81 mg by mouth daily.   Astragalus Root Powd See admin instructions.   atorvastatin 40 MG tablet Commonly known as: LIPITOR Take 40 mg by mouth every evening.   azithromycin 250 MG tablet Commonly known as: ZITHROMAX Take 250 mg by mouth as directed.   B-100 COMPLEX CR PO 1 tablet   brompheniramine-pseudoephedrine-DM 30-2-10 MG/5ML syrup Take 5 mLs by mouth 3 (three) times daily as needed.   Contour Next Test test strip Generic drug: glucose blood 2 (two) times daily.   CoQ-10 100 MG capsule See admin instructions.   doxycycline 100 MG tablet Commonly known as: VIBRA-TABS Take 1 tablet (100 mg total) by mouth 2 (two) times daily.   fluticasone 50 MCG/ACT nasal spray Commonly known as: FLONASE Place 2 sprays into both nostrils daily.   Green Tea 150 MG Caps See admin instructions.  ketoconazole 2 % cream Commonly known as: NIZORAL Apply 1 fingertip amount to each foot daily.   lisinopril-hydrochlorothiazide 20-25 MG tablet Commonly known as: ZESTORETIC Take 1 tablet by mouth daily.   meclizine 25 MG tablet Commonly known as: ANTIVERT Take 1 tablet (25 mg total) by mouth 3 (three) times daily as needed for dizziness.   meloxicam 15 MG tablet Commonly known as: MOBIC Take 15 mg by mouth daily.   neomycin-polymyxin-hydrocortisone OTIC solution Commonly known as: CORTISPORIN Apply 2 drops to the ingrown toenail site twice daily. Cover with band-aid.   NovoLOG 100 UNIT/ML injection Generic  drug: insulin aspart INJECT UP TO 100 UNITS DAILY MAXIMUM DOSE WITH INSULIN PUMP DAILY   OMEGA 3-6-9 FATTY ACIDS PO See admin instructions.   ondansetron 8 MG disintegrating tablet Commonly known as: Zofran ODT 8mg  ODT q6 hours prn nausea   OVER THE COUNTER MEDICATION Omega 369 1 bid   OVER THE COUNTER MEDICATION Vitamin c 1000 mg daily   OVER THE COUNTER MEDICATION Vitamin d 3 2000 iu daily   OVER THE COUNTER MEDICATION probiotiotic qday   OVER THE COUNTER MEDICATION Methyl folate 1000 mg qd   OVER THE COUNTER MEDICATION Green tea extract 400 mg qd   OVER THE COUNTER MEDICATION Astragalis root 470 mg daily   OVER THE COUNTER MEDICATION Grape seed extract 200 mg qd   OVER THE COUNTER MEDICATION Alpha lipoic acid 600mg  qd   OVER THE COUNTER MEDICATION Acetyl l-caritine 500 gm qd   OVER THE COUNTER MEDICATION reservetrol 50 mg qd   OVER THE COUNTER MEDICATION coq10 120 mg qd   OVER THE COUNTER MEDICATION b complex 100 mg qd   OVER THE COUNTER MEDICATION Calcium 630 mg qd   REFRESH OP Apply 1-2 drops to eye every 3 (three) hours as needed (dryness).   tiZANidine 2 MG tablet Commonly known as: ZANAFLEX Take 2 mg by mouth 3 (three) times daily as needed.   traMADol 50 MG tablet Commonly known as: Ultram Take 1 tablet (50 mg total) by mouth every 6 (six) hours as needed.   Vitamin C & D3/Rose Hips 244-0102-72 MG-UNIT-MG Caps Generic drug: Vit C-Cholecalciferol-Rose Hip See admin instructions.   Vitamin D 50 MCG (2000 UT) Caps 1 capsule       Allergies:  Allergies  Allergen Reactions  . Latex Itching and Rash    Family History: Family History  Problem Relation Age of Onset  . Hypertension Mother   . Hyperlipidemia Mother   . Diabetes Mother   . Diabetes Father     Social History:  reports that he has never smoked. He has never used smokeless tobacco. He reports that he does not drink alcohol and does not use drugs.  ROS: All other  review of systems were reviewed and are negative except what is noted above in HPI  Physical Exam: There were no vitals taken for this visit.  Constitutional:  Alert and oriented, No acute distress. HEENT: [place] AT, moist mucus membranes.  Trachea midline, no masses. Cardiovascular: No clubbing, cyanosis, or edema. Respiratory: Normal respiratory effort, no increased work of breathing. GI: Abdomen is soft, nontender, nondistended, no abdominal masses GU: No CVA tenderness.  Lymph: No cervical or inguinal lymphadenopathy. Skin: No rashes, bruises or suspicious lesions. Neurologic: Grossly intact, no focal deficits, moving all 4 extremities. Psychiatric: Normal mood and affect.  Laboratory Data: Lab Results  Component Value Date   WBC 20.5 (H) 08/17/2019   HGB 12.9 (L) 10/12/2019  HCT 38.0 (L) 10/12/2019   MCV 99.0 08/17/2019   PLT 309 08/17/2019    Lab Results  Component Value Date   CREATININE 0.90 10/12/2019    No results found for: PSA  No results found for: TESTOSTERONE  Lab Results  Component Value Date   HGBA1C 6.7 (H) 04/10/2014    Urinalysis    Component Value Date/Time   COLORURINE YELLOW 08/16/2015 0244   APPEARANCEUR Clear 01/03/2020 1537   LABSPEC 1.029 08/16/2015 0244   PHURINE 5.5 08/16/2015 0244   GLUCOSEU Negative 01/03/2020 1537   HGBUR NEGATIVE 08/16/2015 0244   BILIRUBINUR Negative 01/03/2020 1537   KETONESUR >80 (A) 08/16/2015 0244   PROTEINUR Negative 01/03/2020 1537   PROTEINUR NEGATIVE 08/16/2015 0244   UROBILINOGEN 0.2 04/09/2014 1947   NITRITE Negative 01/03/2020 1537   NITRITE NEGATIVE 08/16/2015 0244   LEUKOCYTESUR Negative 01/03/2020 1537    Lab Results  Component Value Date   LABMICR Comment 01/03/2020   BACTERIA RARE (A) 08/16/2015    Pertinent Imaging:  Results for orders placed during the hospital encounter of 05/23/10  DG Abd 1 View  Narrative *RADIOLOGY REPORT*  Clinical Data: Weakness with nausea and  vomiting.  ABDOMEN - 1 VIEW  Comparison: 02/10/2010.  Findings: Ventriculoperitoneal shunt tubing extends into the right mid abdomen.  There is no shunt discontinuity.  Moderate stool is noted throughout the colon.  The bowel gas pattern is nonobstructive.  There are postsurgical changes of the lower lumbar spine status post fusion.  IMPRESSION:  1.  Moderate stool throughout the colon suggesting constipation. 2.  Intact ventricular peritoneal shunt.  Original Report Authenticated By: Vivia Ewing, M.D.  No results found for this or any previous visit.  No results found for this or any previous visit.  No results found for this or any previous visit.  No results found for this or any previous visit.  No results found for this or any previous visit.  No results found for this or any previous visit.  No results found for this or any previous visit.   Assessment & Plan:    1. Benign localized prostatic hyperplasia with lower urinary tract symptoms (LUTS) -vesicare 5mg  daily  2. Urinary urgency -vesicare 5mg    No follow-ups on file.  Nicolette Bang, MD  [facility] [place]

## 2020-08-14 NOTE — Patient Instructions (Signed)
Overactive Bladder, Adult  Overactive bladder is a condition in which a person has a sudden and frequent need to urinate. A person might also leak urine if he or she cannot get to the bathroom fast enough (urinary incontinence). Sometimes, symptoms can interfere with work or social activities. What are the causes? Overactive bladder is associated with poor nerve signals between your bladder and your brain. Your bladder may get the signal to empty before it is full. You may also have very sensitive muscles that make your bladder squeeze too soon. This condition may also be caused by other factors, such as:  Medical conditions: ? Urinary tract infection. ? Infection of nearby tissues. ? Prostate enlargement. ? Bladder stones, inflammation, or tumors. ? Diabetes. ? Muscle or nerve weakness, especially from these conditions:  A spinal cord injury.  Stroke.  Multiple sclerosis.  Parkinson's disease.  Other causes: ? Surgery on the uterus or urethra. ? Drinking too much caffeine or alcohol. ? Certain medicines, especially those that eliminate extra fluid in the body (diuretics). ? Constipation. What increases the risk? You may be at greater risk for overactive bladder if you:  Are an older adult.  Smoke.  Are going through menopause.  Have prostate problems.  Have a neurological disease, such as stroke, dementia, Parkinson's disease, or multiple sclerosis (MS).  Eat or drink alcohol, spicy food, caffeine, and other things that irritate the bladder.  Are overweight or obese. What are the signs or symptoms? Symptoms of this condition include a sudden, strong urge to urinate. Other symptoms include:  Leaking urine.  Urinating 8 or more times a day.  Waking up to urinate 2 or more times overnight. How is this diagnosed? This condition may be diagnosed based on:  Your symptoms and medical history.  A physical exam.  Blood or urine tests to check for possible causes,  such as infection. You may also need to see a health care provider who specializes in urinary tract problems. This is called a urologist. How is this treated? Treatment for overactive bladder depends on the cause of your condition and whether it is mild or severe. Treatment may include:  Bladder training, such as: ? Learning to control the urge to urinate by following a schedule to urinate at regular intervals. ? Doing Kegel exercises to strengthen the pelvic floor muscles that support your bladder.  Special devices, such as: ? Biofeedback. This uses sensors to help you become aware of your body's signals. ? Electrical stimulation. This uses electrodes placed inside the body (implanted) or outside the body. These electrodes send gentle pulses of electricity to strengthen the nerves or muscles that control the bladder. ? Women may use a plastic device, called a pessary, that fits into the vagina and supports the bladder.  Medicines, such as: ? Antibiotics to treat bladder infection. ? Antispasmodics to stop the bladder from releasing urine at the wrong time. ? Tricyclic antidepressants to relax bladder muscles. ? Injections of botulinum toxin type A directly into the bladder tissue to relax bladder muscles.  Surgery, such as: ? A device may be implanted to help manage the nerve signals that control urination. ? An electrode may be implanted to stimulate electrical signals in the bladder. ? A procedure may be done to change the shape of the bladder. This is done only in very severe cases. Follow these instructions at home: Eating and drinking  Make diet or lifestyle changes recommended by your health care provider. These may include: ? Drinking fluids   throughout the day and not only with meals. ? Cutting down on caffeine or alcohol. ? Eating a healthy and balanced diet to prevent constipation. This may include:  Choosing foods that are high in fiber, such as beans, whole grains, and  fresh fruits and vegetables.  Limiting foods that are high in fat and processed sugars, such as fried and sweet foods.   Lifestyle  Lose weight if needed.  Do not use any products that contain nicotine or tobacco. These include cigarettes, chewing tobacco, and vaping devices, such as e-cigarettes. If you need help quitting, ask your health care provider.   General instructions  Take over-the-counter and prescription medicines only as told by your health care provider.  If you were prescribed an antibiotic medicine, take it as told by your health care provider. Do not stop taking the antibiotic even if you start to feel better.  Use any implants or pessary as told by your health care provider.  If needed, wear pads to absorb urine leakage.  Keep a log to track how much and when you drink, and when you need to urinate. This will help your health care provider monitor your condition.  Keep all follow-up visits. This is important. Contact a health care provider if:  You have a fever or chills.  Your symptoms do not get better with treatment.  Your pain and discomfort get worse.  You have more frequent urges to urinate. Get help right away if:  You are not able to control your bladder. Summary  Overactive bladder refers to a condition in which a person has a sudden and frequent need to urinate.  Several conditions may lead to an overactive bladder.  Treatment for overactive bladder depends on the cause and severity of your condition.  Making lifestyle changes, doing Kegel exercises, keeping a log, and taking medicines can help with this condition. This information is not intended to replace advice given to you by your health care provider. Make sure you discuss any questions you have with your health care provider. Document Revised: 12/25/2019 Document Reviewed: 12/25/2019 Elsevier Patient Education  2021 [employer]  

## 2020-08-15 DIAGNOSIS — E871 Hypo-osmolality and hyponatremia: Secondary | ICD-10-CM | POA: Diagnosis not present

## 2020-08-15 NOTE — Progress Notes (Signed)
HEMATOLOGY/ONCOLOGY CLINIC NOTE  Date of Service: 08/16/20  Patient Care Team:  Dr. Merrilee Seashore as PCP  CHIEF COMPLAINTS:  Follow up for CLL  HISTORY OF PRESENTING ILLNESS: plz see initial consultation for details on initial presentation  INTERVAL HISTORY  Christopher Conner is here for his scheduled followup for CLL. The patient's last visit with [place] was on 08/17/2019. The pt reports that he is doing well overall.  The pt reports that he has been staying very busy with working on his kitchen renovations. He notes that this is gradually getting closer towards completion. The pt notes no new symptoms. The pt notes tat he has been closely monitoring his blood pressure over the last 1-2 months. The pt notes that he brought this up as he has concerns over the elevation in his BP. The pt notes that he currently is monitoring this with an at-home BP machine 1-2x daily. He notes that it is mostly in the 120-130s. The pt notes they stopped his diuretic and put him on some BP medicine. The pt also notes he experiences frequent urination during the night, at least 3x. He experiences frequent urge to pee and notes a strong stream everytime. The pt notes he is being closely monitored for his electrolyte levels and hydration.  The pt notes he has been off the HCTZ for over a month and his sodium levels have improved.  Lab results today 08/16/2020 of CBC w/diff and CMP is as follows: all values are WNL except for WBC of 17.9K, RBC of 3.94, Hgb of 12.8, HCT of 38.9,Lymphs Abs of 12.2K, Glucose of 63. 08/16/2020 LDH of 187.  On review of systems, pt reports concerns over BP, frequent urination, intermittent hot night sweats and denies fevers, chills, cold night sweats, acute constipation, and any other symptoms.   MEDICAL HISTORY:  Past Medical History:  Diagnosis Date  . Arthritis   . BPH (benign prostatic hyperplasia)   . 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  CLL (chronic lymphocytic leukemia) ([place])   . DM type 1 (diabetes mellitus, type 1) (HCC)    type 1  . High cholesterol   . Hypertension   . 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 APPENDECTOMY  early 20's  . BACK SURGERY    . CATARACT EXTRACTION Bilateral    left eye prostheisi "shell"  . CYST REMOVAL NECK  yrs ago  . CYSTOSCOPY WITH INSERTION OF UROLIFT  april/march 2020   done in office  . CYSTOSCOPY WITH INSERTION OF UROLIFT [name] 10/12/2019   Procedure: CYSTOSCOPY WITH INSERTION OF UROLIFT;  Surgeon: Cleon Gustin, MD;  Location: [facility];  Service: Urology;  Laterality: [name];  . [place] and 2000   l5 to s1 1998, then l4 and l5 in 2000  . VENTRICULOPERITONEAL SHUNT  2006    SOCIAL HISTORY: Social History   Socioeconomic History  . Marital status: Married    Spouse name: Not on file  . Number of children: Not on file  . Years of education: Not on file  . Highest education level: Not on file  Occupational History  . 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 Smoking status: Never Smoker  . Smokeless tobacco: Never Used  Vaping Use  . Vaping Use: Never used  Substance and Sexual Activity  . Alcohol use: No  . Drug use: No  . Sexual activity: Not on file  Other Topics Concern  . Not on file  Social History Narrative  . Not on file   Social Determinants of Health   Financial Resource Strain: Not on file  Food Insecurity: Not on file  Transportation Needs: Not on file  Physical Activity: Not on file  Stress: Not on file  Social Connections: Not on file  Intimate Partner Violence: Not on file    FAMILY HISTORY: Family History  Problem Relation Age of Onset  . Hypertension Mother   . Hyperlipidemia Mother   . Diabetes Mother   . Diabetes Father     ALLERGIES:  is allergic to  latex.  MEDICATIONS:  Current Outpatient Medications  Medication Sig Dispense Refill  . Acetylcarnitine HCl (ACETYL L-CARNITINE) 250 MG CAPS 1 capsule    . Alpha-Lipoic Acid 600 MG CAPS 1 capsule    . amLODipine (NORVASC) 10 MG tablet every evening.   2  . amLODipine (NORVASC) 10 MG tablet Take 1 tablet by mouth daily.    [name] aspirin 81 MG tablet Take 81 mg by mouth daily. (Patient not taking: Reported on 01/03/2020)    . atorvastatin (LIPITOR) 40 MG tablet Take 40 mg by mouth every evening.     [name] azithromycin (ZITHROMAX) 250 MG tablet Take 250 mg by mouth as directed.    . B Complex-Biotin-FA (B-100 COMPLEX CR PO) 1 tablet    . brompheniramine-pseudoephedrine-DM 30-2-10 MG/5ML syrup Take 5 mLs by mouth 3 (three) times daily as needed.    . Cholecalciferol (VITAMIN D) 50 MCG (2000 UT) CAPS 1 capsule    . Coenzyme Q10 (COQ-10) 100 MG capsule See admin instructions.    . CONTOUR NEXT TEST test strip 2 (two) times daily.    [name] doxycycline (VIBRA-TABS) 100 MG tablet Take 1 tablet (100 mg total) by mouth 2 (two) times daily. 20 tablet 0  . fluticasone (FLONASE) 50 MCG/ACT nasal spray Place 2 sprays into both nostrils daily.    Nyoka Cowden Tea 150 MG CAPS See admin instructions.    [name] ketoconazole (NIZORAL) 2 % cream Apply 1 fingertip amount to each foot daily. 30 g 0  . lisinopril (ZESTRIL) 40 MG tablet Take 40 mg by mouth daily.    [name] lisinopril-hydrochlorothiazide (ZESTORETIC) 20-25 MG tablet Take 1 tablet by mouth daily.  (Patient not taking: Reported on 08/16/2020)    . meclizine (ANTIVERT) 25 MG tablet Take 1 tablet (25 mg total) by mouth 3 (three) times daily as needed for dizziness. 30 tablet 0  . meloxicam (MOBIC) 15 MG tablet Take 15 mg by mouth daily.    [name] neomycin-polymyxin-hydrocortisone (CORTISPORIN) OTIC solution Apply 2 drops to the ingrown toenail site twice daily. Cover with band-aid. 10 mL 0  . NOVOLOG 100 UNIT/ML injection INJECT UP TO 100 UNITS DAILY MAXIMUM DOSE WITH INSULIN PUMP DAILY   1  . OMEGA 3-6-9 FATTY ACIDS PO See admin instructions.    . ondansetron (ZOFRAN ODT) 8 MG disintegrating tablet 8mg  ODT q6 hours prn nausea 8 tablet 0  . OVER THE COUNTER MEDICATION Omega 369 1 bid    . OVER THE COUNTER MEDICATION Vitamin c 1000 mg daily    . OVER THE COUNTER MEDICATION Vitamin d 3 2000 iu daily    . OVER THE COUNTER MEDICATION probiotiotic qday    . OVER THE COUNTER MEDICATION Methyl folate 1000 mg qd    .  OVER THE COUNTER MEDICATION Green tea extract 400 mg qd    . OVER THE COUNTER MEDICATION Astragalis root 470 mg daily    . OVER THE COUNTER MEDICATION Grape seed extract 200 mg qd    . OVER THE COUNTER MEDICATION Alpha lipoic acid 600mg  qd    . OVER THE COUNTER MEDICATION Acetyl l-caritine 500 gm qd    . OVER THE COUNTER MEDICATION reservetrol 50 mg qd    . OVER THE COUNTER MEDICATION coq10 120 mg qd    . OVER THE COUNTER MEDICATION b complex 100 mg qd    . OVER THE COUNTER MEDICATION Calcium 630 mg qd    . Polyvinyl Alcohol-Povidone (REFRESH OP) Apply 1-2 drops to eye every 3 (three) hours as needed (dryness).     . solifenacin (VESICARE) 5 MG tablet Take 1 tablet (5 mg total) by mouth daily. 30 tablet 3  . tiZANidine (ZANAFLEX) 2 MG tablet Take 2 mg by mouth 3 (three) times daily as needed. (Patient not taking: Reported on 08/16/2020)    . Tragacanth (ASTRAGALUS ROOT) POWD See admin instructions.    . traMADol (ULTRAM) 50 MG tablet Take 1 tablet (50 mg total) by mouth every 6 (six) hours as needed. 15 tablet 0  . Vit C-Cholecalciferol-Rose Hip (VITAMIN C & D3/ROSE HIPS) [phone] MG-UNIT-MG CAPS See admin instructions.     No current facility-administered medications for this visit.    REVIEW OF SYSTEMS:   10 Point review of Systems was done is negative except as noted above.  PHYSICAL EXAMINATION: ECOG PERFORMANCE STATUS: 1 - Symptomatic but completely ambulatory  Vitals:   08/16/20 1109  BP: (!) 150/60  Pulse: 65  Resp: 18  Temp: 98.1 F (36.7 C)   SpO2: 100%   Filed Weights   08/16/20 1109  Weight: 180 lb 11.2 oz (82 kg)   .Body mass index is 25.93 kg/m.    GENERAL:alert, in no acute distress and comfortable SKIN: no acute rashes, no significant lesions EYES: conjunctiva are pink and non-injected, sclera anicteric OROPHARYNX: MMM, no exudates, no oropharyngeal erythema or ulceration NECK: supple, no JVD LYMPH:  no palpable lymphadenopathy in the cervical, axillary or inguinal regions LUNGS: clear to auscultation b/l with normal respiratory effort HEART: regular rate & rhythm ABDOMEN:  normoactive bowel sounds , non tender, not distended. Extremity: no pedal edema PSYCH: alert & oriented x 3 with fluent speech NEURO: no focal motor/sensory deficits  LABORATORY DATA:  I have reviewed the data as listed  . CBC Latest Ref Rng & Units 08/16/2020 10/12/2019 08/17/2019  WBC 4.0 - 10.5 K/uL 17.9(H) - 20.5(H)  Hemoglobin 13.0 - 17.0 g/dL 12.8(L) 12.9(L) 12.8(L)  Hematocrit 39.0 - 52.0 % 38.9(L) 38.0(L) 39.1  Platelets 150 - 400 K/uL 306 - 309   . CBC    Component Value Date/Time   WBC 17.9 (H) 08/16/2020 0956   RBC 3.94 (L) 08/16/2020 0956   HGB 12.8 (L) 08/16/2020 0956   HGB 13.2 08/16/2017 1405   HGB 12.9 (L) 02/09/2017 1302   HCT 38.9 (L) 08/16/2020 0956   HCT 38.6 02/09/2017 1302   PLT 306 08/16/2020 0956   PLT 284 08/16/2017 1405   PLT 290 02/09/2017 1302   MCV 98.7 08/16/2020 0956   MCV 97.0 02/09/2017 1302   MCH 32.5 08/16/2020 0956   MCHC 32.9 08/16/2020 0956   RDW 12.5 08/16/2020 0956   RDW 12.7 02/09/2017 1302   LYMPHSABS 12.2 (H) 08/16/2020 0956   LYMPHSABS 12.9 (H) 02/09/2017 1302  MONOABS 0.6 08/16/2020 0956   MONOABS 0.7 02/09/2017 1302   EOSABS 0.2 08/16/2020 0956   EOSABS 0.2 02/09/2017 1302   BASOSABS 0.1 08/16/2020 0956   BASOSABS 0.0 02/09/2017 1302     CMP Latest Ref Rng & Units 08/16/2020 10/12/2019 08/17/2019  Glucose 70 - 99 mg/dL 63(L) 124(H) 84  BUN 8 - 23 mg/dL 21 24(H) 32(H)   Creatinine 0.61 - 1.24 mg/dL 0.92 0.90 1.08  Sodium 135 - 145 mmol/L 135 137 138  Potassium 3.5 - 5.1 mmol/L 4.6 4.1 4.7  Chloride 98 - 111 mmol/L 99 100 102  CO2 22 - 32 mmol/L 27 - 28  Calcium 8.9 - 10.3 mg/dL 9.3 - 9.0  Total Protein 6.5 - 8.1 g/dL 6.9 - 6.6  Total Bilirubin 0.3 - 1.2 mg/dL 0.4 - 0.4  Alkaline Phos 38 - 126 U/L 65 - 52  AST 15 - 41 U/L 28 - 31  ALT 0 - 44 U/L 25 - 33  . Lab Results  Component Value Date   LDH 187 08/16/2020         ASSESSMENT & PLAN:   Mr Slimp is a very pleasant 71 y.o.  Caucasian gentleman with  1) Rai Stage 0 CLL with lymphocytosis with no overt LNadenopathy or palpable hepatosplenomegaly. On presentation Absolute lymphocyte count of 8.8k with about 79 percent clonal lymphocytes on flow cytometry consistent with CLL. nl Hgb and platelets.  02/09/2017 labs, the patient's hbg is at 12.9, plt improved to 290k and WBC count of 19.6 with absolute lymphocyte count at 12.9.   CLL prognostic FISH panel neg for commonly tested mutations. No constitutional symptoms. No new lymphadenopathy.  PLAN: -Discussed pt labwork today, 08/16/2020; no change in lymphocytes, chemistries and LDH normal. Glucose lower. Hgb stable. Plt normal. Sodium normal. -Advised pt that the best time to check BP is first thing in the morning prior to any movement, water, or coffee. Advised that BP at night and during the day of 115-130 is very good. -Advised pt that dehydration can cause reflex increase in urination and urge to pee. Continue to monitor his dehydration and electrolyte levels. -The pt shows no overt clinical or lab progression of his CLL at this time.  -No indication to initiate treatment at this time.  -Will see back in 12 months with labs.   2) Constipation. Being mx by his PCP. He had a CT of the abdomen and pelvis on 06/13/2015  which showed no evidence of obstructing lesion. Moderate fecal burden throughout the colon. No lymphadenopathy in the  abdomen or pelvis.  Colonoscopy was unrevealing for any concerning findings as per patient.   FOLLOW UP: RTC with Dr Irene Limbo with labs in 12 months   No orders of the defined types were placed in this encounter.  The total time spent in the appt was 20 minutes and more than 50% was on counseling and direct patient cares.  All of the patient's questions were answered with apparent satisfaction. The patient knows to call the clinic with any problems, questions or concerns.  Sullivan Lone MD [place] AAHIVMS [facility] Hematology/Oncology Physician [facility]  (Office):       [phone] (Work cell):  [phone] (Fax):           [phone]  I, Reinaldo Raddle, am acting as scribe for Dr. Sullivan Lone, MD.    .I have reviewed the above documentation for accuracy and completeness, and I agree with the above. Brunetta Genera MD

## 2020-08-16 ENCOUNTER — Inpatient Hospital Stay (HOSPITAL_BASED_OUTPATIENT_CLINIC_OR_DEPARTMENT_OTHER): Payer: Medicare Other | Admitting: Hematology

## 2020-08-16 ENCOUNTER — Inpatient Hospital Stay: Payer: Medicare Other | Attending: Hematology

## 2020-08-16 ENCOUNTER — Other Ambulatory Visit: Payer: Self-pay

## 2020-08-16 VITALS — BP 150/60 | HR 65 | Temp 98.1°F | Resp 18 | Ht 70.0 in | Wt 180.7 lb

## 2020-08-16 DIAGNOSIS — R350 Frequency of micturition: Secondary | ICD-10-CM | POA: Diagnosis not present

## 2020-08-16 DIAGNOSIS — K5900 Constipation, unspecified: Secondary | ICD-10-CM | POA: Insufficient documentation

## 2020-08-16 DIAGNOSIS — C9110 Chronic lymphocytic leukemia of B-cell type not having achieved remission: Secondary | ICD-10-CM | POA: Diagnosis not present

## 2020-08-16 DIAGNOSIS — I10 Essential (primary) hypertension: Secondary | ICD-10-CM | POA: Diagnosis not present

## 2020-08-16 DIAGNOSIS — E860 Dehydration: Secondary | ICD-10-CM | POA: Diagnosis not present

## 2020-08-16 LAB — CBC WITH DIFFERENTIAL/PLATELET
Abs Immature Granulocytes: 0.03 10*3/uL (ref 0.00–0.07)
Basophils Absolute: 0.1 10*3/uL (ref 0.0–0.1)
Basophils Relative: 0 %
Eosinophils Absolute: 0.2 10*3/uL (ref 0.0–0.5)
Eosinophils Relative: 1 %
HCT: 38.9 % — ABNORMAL LOW (ref 39.0–52.0)
Hemoglobin: 12.8 g/dL — ABNORMAL LOW (ref 13.0–17.0)
Immature Granulocytes: 0 %
Lymphocytes Relative: 69 %
Lymphs Abs: 12.2 10*3/uL — ABNORMAL HIGH (ref 0.7–4.0)
MCH: 32.5 pg (ref 26.0–34.0)
MCHC: 32.9 g/dL (ref 30.0–36.0)
MCV: 98.7 fL (ref 80.0–100.0)
Monocytes Absolute: 0.6 10*3/uL (ref 0.1–1.0)
Monocytes Relative: 3 %
Neutro Abs: 4.9 10*3/uL (ref 1.7–7.7)
Neutrophils Relative %: 27 %
Platelets: 306 10*3/uL (ref 150–400)
RBC: 3.94 MIL/uL — ABNORMAL LOW (ref 4.22–5.81)
RDW: 12.5 % (ref 11.5–15.5)
WBC: 17.9 10*3/uL — ABNORMAL HIGH (ref 4.0–10.5)
nRBC: 0 % (ref 0.0–0.2)

## 2020-08-16 LAB — CMP (CANCER CENTER ONLY)
ALT: 25 U/L (ref 0–44)
AST: 28 U/L (ref 15–41)
Albumin: 4.1 g/dL (ref 3.5–5.0)
Alkaline Phosphatase: 65 U/L (ref 38–126)
Anion gap: 9 (ref 5–15)
BUN: 21 mg/dL (ref 8–23)
CO2: 27 mmol/L (ref 22–32)
Calcium: 9.3 mg/dL (ref 8.9–10.3)
Chloride: 99 mmol/L (ref 98–111)
Creatinine: 0.92 mg/dL (ref 0.61–1.24)
GFR, Estimated: >60 mL/min (ref >60)
Glucose, Bld: 63 mg/dL — ABNORMAL LOW (ref 70–99)
Potassium: 4.6 mmol/L (ref 3.5–5.1)
Sodium: 135 mmol/L (ref 135–145)
Total Bilirubin: 0.4 mg/dL (ref 0.3–1.2)
Total Protein: 6.9 g/dL (ref 6.5–8.1)

## 2020-08-16 LAB — LACTATE DEHYDROGENASE: LDH: 187 U/L (ref 98–192)

## 2020-08-17 DIAGNOSIS — E1021 Type 1 diabetes mellitus with diabetic nephropathy: Secondary | ICD-10-CM | POA: Diagnosis not present

## 2020-08-17 DIAGNOSIS — E7800 Pure hypercholesterolemia, unspecified: Secondary | ICD-10-CM | POA: Diagnosis not present

## 2020-08-17 DIAGNOSIS — I10 Essential (primary) hypertension: Secondary | ICD-10-CM | POA: Diagnosis not present

## 2020-08-21 ENCOUNTER — Telehealth: Payer: Self-pay | Admitting: Hematology

## 2020-08-21 NOTE — Telephone Encounter (Signed)
Left message with follow-up appointment per 4/29 los. Gave option to call back to reschedule if needed.

## 2020-08-22 DIAGNOSIS — E871 Hypo-osmolality and hyponatremia: Secondary | ICD-10-CM | POA: Diagnosis not present

## 2020-08-22 DIAGNOSIS — I700 Atherosclerosis of aorta: Secondary | ICD-10-CM | POA: Diagnosis not present

## 2020-08-22 DIAGNOSIS — E1021 Type 1 diabetes mellitus with diabetic nephropathy: Secondary | ICD-10-CM | POA: Diagnosis not present

## 2020-08-26 DIAGNOSIS — E1021 Type 1 diabetes mellitus with diabetic nephropathy: Secondary | ICD-10-CM | POA: Diagnosis not present

## 2020-09-11 ENCOUNTER — Encounter: Payer: Self-pay | Admitting: Urology

## 2020-09-13 ENCOUNTER — Telehealth: Payer: Self-pay

## 2020-09-13 DIAGNOSIS — R350 Frequency of micturition: Secondary | ICD-10-CM

## 2020-09-17 ENCOUNTER — Other Ambulatory Visit: Payer: Self-pay

## 2020-09-17 DIAGNOSIS — R350 Frequency of micturition: Secondary | ICD-10-CM

## 2020-09-17 DIAGNOSIS — E7800 Pure hypercholesterolemia, unspecified: Secondary | ICD-10-CM | POA: Diagnosis not present

## 2020-09-17 DIAGNOSIS — I10 Essential (primary) hypertension: Secondary | ICD-10-CM | POA: Diagnosis not present

## 2020-09-17 DIAGNOSIS — E1021 Type 1 diabetes mellitus with diabetic nephropathy: Secondary | ICD-10-CM | POA: Diagnosis not present

## 2020-09-17 MED ORDER — SOLIFENACIN SUCCINATE 5 MG PO TABS: 5.0000 mg | ORAL_TABLET | Freq: Two times a day (BID) | ORAL | 0 refills | Status: DC

## 2020-09-17 MED ORDER — SOLIFENACIN SUCCINATE 5 MG PO TABS: 5.0000 mg | ORAL_TABLET | Freq: Every day | ORAL | 0 refills | Status: DC

## 2020-09-17 NOTE — Telephone Encounter (Signed)
New order for vesicare 5mg  BID per Dr. Alyson Ingles to be sent in to help with urinary symptoms.  Patient called and notified. Prescription sent to pharmacy.   If increase helps with symptoms, pt will call back for 3 month supply.

## 2020-09-19 DIAGNOSIS — E1021 Type 1 diabetes mellitus with diabetic nephropathy: Secondary | ICD-10-CM | POA: Diagnosis not present

## 2020-09-19 DIAGNOSIS — I10 Essential (primary) hypertension: Secondary | ICD-10-CM | POA: Diagnosis not present

## 2020-09-27 DIAGNOSIS — Z23 Encounter for immunization: Secondary | ICD-10-CM | POA: Diagnosis not present

## 2020-10-17 DIAGNOSIS — E1021 Type 1 diabetes mellitus with diabetic nephropathy: Secondary | ICD-10-CM | POA: Diagnosis not present

## 2020-10-17 DIAGNOSIS — I10 Essential (primary) hypertension: Secondary | ICD-10-CM | POA: Diagnosis not present

## 2020-10-17 DIAGNOSIS — E7800 Pure hypercholesterolemia, unspecified: Secondary | ICD-10-CM | POA: Diagnosis not present

## 2020-10-28 DIAGNOSIS — E1021 Type 1 diabetes mellitus with diabetic nephropathy: Secondary | ICD-10-CM | POA: Diagnosis not present

## 2020-10-28 DIAGNOSIS — I10 Essential (primary) hypertension: Secondary | ICD-10-CM | POA: Diagnosis not present

## 2020-11-04 DIAGNOSIS — E1021 Type 1 diabetes mellitus with diabetic nephropathy: Secondary | ICD-10-CM | POA: Diagnosis not present

## 2020-11-04 DIAGNOSIS — E7800 Pure hypercholesterolemia, unspecified: Secondary | ICD-10-CM | POA: Diagnosis not present

## 2020-11-04 DIAGNOSIS — I10 Essential (primary) hypertension: Secondary | ICD-10-CM | POA: Diagnosis not present

## 2020-11-13 ENCOUNTER — Other Ambulatory Visit: Payer: Self-pay

## 2020-11-13 DIAGNOSIS — R350 Frequency of micturition: Secondary | ICD-10-CM

## 2020-11-13 MED ORDER — SOLIFENACIN SUCCINATE 5 MG PO TABS: 5.0000 mg | ORAL_TABLET | Freq: Two times a day (BID) | ORAL | 0 refills | Status: DC

## 2020-11-15 ENCOUNTER — Ambulatory Visit: Payer: Medicare Other | Admitting: Urology

## 2020-11-17 DIAGNOSIS — E1021 Type 1 diabetes mellitus with diabetic nephropathy: Secondary | ICD-10-CM | POA: Diagnosis not present

## 2020-11-17 DIAGNOSIS — E7800 Pure hypercholesterolemia, unspecified: Secondary | ICD-10-CM | POA: Diagnosis not present

## 2020-11-17 DIAGNOSIS — I10 Essential (primary) hypertension: Secondary | ICD-10-CM | POA: Diagnosis not present

## 2020-12-17 ENCOUNTER — Telehealth (INDEPENDENT_AMBULATORY_CARE_PROVIDER_SITE_OTHER): Payer: Medicare Other | Admitting: Urology

## 2020-12-17 ENCOUNTER — Other Ambulatory Visit: Payer: Self-pay

## 2020-12-17 ENCOUNTER — Encounter: Payer: Self-pay | Admitting: Urology

## 2020-12-17 DIAGNOSIS — N401 Enlarged prostate with lower urinary tract symptoms: Secondary | ICD-10-CM

## 2020-12-17 DIAGNOSIS — R3915 Urgency of urination: Secondary | ICD-10-CM

## 2020-12-17 DIAGNOSIS — R350 Frequency of micturition: Secondary | ICD-10-CM | POA: Diagnosis not present

## 2020-12-17 NOTE — Progress Notes (Signed)
12/17/2020 3:01 PM   Christopher Conner 1950/10/15 [id number]  Referring provider: Merrilee Seashore, [place] [address] [place],   [id number]  Patient location: home Physician location: office I connected with  Christopher Conner on 12/17/20 by a video enabled telemedicine application and verified that I am speaking with the correct person using two identifiers.   I discussed the limitations of evaluation and management by telemedicine. The patient expressed understanding and agreed to proceed.    Followup BPH  HPI: Christopher Conner is a 70yo here for followup for BPH, urinary frequency and urinary urgency. He was started on vesicare '5mg'$  BID last visit which has improve his urinary urgency and frequency. He is able to delay urination when he gets the urge to urinate. He has nocturia 2x which worsens with fluid intake. No other complaints today. Overall he is pleased with his response to vesicare.    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    Surgical History: Past Surgical History:  Procedure Laterality Date   APPENDECTOMY  early 20's   BACK SURGERY     CATARACT EXTRACTION Bilateral    left eye prostheisi "shell"   CYST REMOVAL NECK  yrs ago   [place]  april/march 2020   done in office   [place] [name] 10/12/2019   Procedure: CYSTOSCOPY WITH INSERTION OF UROLIFT;  Surgeon: Cleon Gustin, MD;  [facility];  Service: Urology;  Laterality: [name];   LUMBAR FUSION  1998 and 2000   l5 to s1 1998, then l4 and l5 in 2000   VENTRICULOPERITONEAL SHUNT  2006     Home Medications:  Allergies as of 12/17/2020       Reactions   Latex Itching, Rash        Medication List        Accurate as of December 17, 2020  3:01 PM. If you have any questions, ask your nurse or doctor.          Acetyl L-Carnitine 250 MG Caps 1 capsule   Alpha-Lipoic Acid 600 MG Caps 1 capsule   amLODipine 10 MG tablet Commonly known as: NORVASC Take 1 tablet by mouth daily.   amLODipine 10 MG tablet Commonly known as: NORVASC every evening.   aspirin 81 MG tablet Take 81 mg by mouth daily.   Astragalus Root Powd See admin instructions.   atorvastatin 40 MG tablet Commonly known as: LIPITOR Take 40 mg by mouth every evening.   azithromycin 250 MG tablet Commonly known as: ZITHROMAX Take 250 mg by mouth as directed.   B-100 COMPLEX CR PO 1 tablet   brompheniramine-pseudoephedrine-DM 30-2-10 MG/5ML syrup Take 5 mLs by mouth 3 (three) times daily as needed.   Contour Next Test test strip Generic drug: glucose blood 2 (two) times daily.   CoQ-10 100 MG capsule See admin instructions.   doxycycline 100 MG tablet Commonly known as: VIBRA-TABS Take 1 tablet (100 mg total) by mouth 2 (two) times daily.   fluticasone 50 MCG/ACT nasal spray Commonly known as: FLONASE  Place 2 sprays into both nostrils daily.   Green Tea 150 MG Caps See admin instructions.   ketoconazole 2 % cream Commonly known as: NIZORAL Apply 1 fingertip amount to each foot daily.   lisinopril 40 MG tablet Commonly known as: ZESTRIL Take 40 mg by mouth daily.   lisinopril-hydrochlorothiazide 20-25 MG tablet Commonly known as: ZESTORETIC Take 1 tablet by mouth daily.   meclizine 25 MG tablet Commonly known as: ANTIVERT Take 1 tablet (25 mg total) by mouth 3 (three) times daily as needed for dizziness.   meloxicam 15 MG tablet Commonly known as: MOBIC Take 15 mg by mouth daily.   neomycin-polymyxin-hydrocortisone OTIC solution Commonly known as:  CORTISPORIN Apply 2 drops to the ingrown toenail site twice daily. Cover with band-aid.   NovoLOG 100 UNIT/ML injection Generic drug: insulin aspart INJECT UP TO 100 UNITS DAILY MAXIMUM DOSE WITH INSULIN PUMP DAILY   OMEGA 3-6-9 FATTY ACIDS PO See admin instructions.   ondansetron 8 MG disintegrating tablet Commonly known as: Zofran ODT '8mg'$  ODT q6 hours prn nausea   OVER THE COUNTER MEDICATION Omega 369 1 bid   OVER THE COUNTER MEDICATION Vitamin c 1000 mg daily   OVER THE COUNTER MEDICATION Vitamin d 3 2000 iu daily   OVER THE COUNTER MEDICATION probiotiotic qday   OVER THE COUNTER MEDICATION Methyl folate 1000 mg qd   OVER THE COUNTER MEDICATION Green tea extract 400 mg qd   OVER THE COUNTER MEDICATION Astragalis root 470 mg daily   OVER THE COUNTER MEDICATION Grape seed extract 200 mg qd   OVER THE COUNTER MEDICATION Alpha lipoic acid '600mg'$  qd   OVER THE COUNTER MEDICATION Acetyl l-caritine 500 gm qd   OVER THE COUNTER MEDICATION reservetrol 50 mg qd   OVER THE COUNTER MEDICATION coq10 120 mg qd   OVER THE COUNTER MEDICATION b complex 100 mg qd   OVER THE COUNTER MEDICATION Calcium 630 mg qd   REFRESH OP Apply 1-2 drops to eye every 3 (three) hours as needed (dryness).   solifenacin 5 MG tablet Commonly known as: VESICARE Take 1 tablet (5 mg total) by mouth in the morning and at bedtime.   tiZANidine 2 MG tablet Commonly known as: ZANAFLEX Take 2 mg by mouth 3 (three) times daily as needed.   Vitamin C & D3/Rose Hips [id number] MG-UNIT-MG Caps Generic drug: Vit C-Cholecalciferol-Rose Hip See admin instructions.   Vitamin D 50 MCG (2000 UT) Caps 1 capsule        Allergies:  Allergies  Allergen Reactions   Latex Itching and Rash    Family History: Family History  Problem Relation Age of Onset   Hypertension Mother    Hyperlipidemia Mother    Diabetes Mother    Diabetes Father     Social History:  reports that he has never  smoked. He has never used smokeless tobacco. He reports that he does not drink alcohol and does not use drugs.  ROS: All other review of systems were reviewed and are negative except what is noted above in HPI   Laboratory Data: Lab Results  Component Value Date   WBC 17.9 (H) 08/16/2020   HGB 12.8 (L) 08/16/2020   HCT 38.9 (L) 08/16/2020   MCV 98.7 08/16/2020   PLT 306 08/16/2020    Lab Results  Component Value Date   CREATININE 0.92 08/16/2020    No results found for: PSA  No results found for: TESTOSTERONE  Lab Results  Component Value Date  HGBA1C 6.7 (H) 04/10/2014    Urinalysis    Component Value Date/Time   COLORURINE YELLOW 08/16/2015 0244   APPEARANCEUR Clear 01/03/2020 1537   LABSPEC 1.029 08/16/2015 0244   PHURINE 5.5 08/16/2015 0244   GLUCOSEU Negative 01/03/2020 1537   HGBUR NEGATIVE 08/16/2015 0244   BILIRUBINUR Negative 01/03/2020 1537   KETONESUR >80 (A) 08/16/2015 0244   PROTEINUR Negative 01/03/2020 1537   PROTEINUR NEGATIVE 08/16/2015 0244   UROBILINOGEN 0.2 04/09/2014 1947   NITRITE Negative 01/03/2020 1537   NITRITE NEGATIVE 08/16/2015 0244   LEUKOCYTESUR Negative 01/03/2020 1537    Lab Results  Component Value Date   LABMICR Comment 01/03/2020   BACTERIA RARE (A) 08/16/2015    Pertinent Imaging:  Results for orders placed during the hospital encounter of 05/23/10  DG Abd 1 View  Narrative *RADIOLOGY REPORT*  Clinical Data: Weakness with nausea and vomiting.  ABDOMEN - 1 VIEW  Comparison: 02/10/2010.  Findings: Ventriculoperitoneal shunt tubing extends into the right mid abdomen.  There is no shunt discontinuity.  Moderate stool is noted throughout the colon.  The bowel gas pattern is nonobstructive.  There are postsurgical changes of the lower lumbar spine status post fusion.  IMPRESSION:  1.  Moderate stool throughout the colon suggesting constipation. 2.  Intact ventricular peritoneal shunt.  Original Report  Authenticated By: Vivia Ewing, M.D.  No results found for this or any previous visit.  No results found for this or any previous visit.  No results found for this or any previous visit.  No results found for this or any previous visit.  No results found for this or any previous visit.  No results found for this or any previous visit.  No results found for this or any previous visit.   Assessment & Plan:    1. Urinary frequency -Continue vesicare '5mg'$  BID  2. Benign localized prostatic hyperplasia with lower urinary tract symptoms (LUTS) -Continue vesicare '5mg'$  BID  3. Urinary urgency Continue vesicare '5mg'$  BID   No follow-ups on file.  Nicolette Bang, MD  [facility] [place]

## 2020-12-17 NOTE — Patient Instructions (Signed)
Benign Prostatic Hyperplasia  Benign prostatic hyperplasia (BPH) is an enlarged prostate gland that is caused by the normal aging process and not by cancer. The prostate is a walnut-sized gland that is involved in the production of semen. It is located in front of the rectum and below the bladder. The bladder stores urine and the urethra is the tube that carries the urine out of the body. The prostate may get bigger asa man gets older. An enlarged prostate can press on the urethra. This can make it harder to pass urine. The build-up of urine in the bladder can cause infection. Back pressure and infection may progress to bladder damage and kidney (renal) failure. What are the causes? This condition is part of a normal aging process. However, not all men develop problems from this condition. If the prostate enlarges away from the urethra, urine flow will not be blocked. If it enlarges toward the urethra andcompresses it, there will be problems passing urine. What increases the risk? This condition is more likely to develop in men over the age of 50 years. What are the signs or symptoms? Symptoms of this condition include: Getting up often during the night to urinate. Needing to urinate frequently during the day. Difficulty starting urine flow. Decrease in size and strength of your urine stream. Leaking (dribbling) after urinating. Inability to pass urine. This needs immediate treatment. Inability to completely empty your bladder. Pain when you pass urine. This is more common if there is also an infection. Urinary tract infection (UTI). How is this diagnosed? This condition is diagnosed based on your medical history, a physical exam, and your symptoms. Tests will also be done, such as: A post-void bladder scan. This measures any amount of urine that may remain in your bladder after you finish urinating. A digital rectal exam. In a rectal exam, your health care provider checks your prostate by  putting a lubricated, gloved finger into your rectum to feel the back of your prostate gland. This exam detects the size of your gland and any abnormal lumps or growths. An exam of your urine (urinalysis). A prostate specific antigen (PSA) screening. This is a blood test used to screen for prostate cancer. An ultrasound. This test uses sound waves to electronically produce a picture of your prostate gland. Your health care provider may refer you to a specialist in kidney and prostate diseases (urologist). How is this treated? Once symptoms begin, your health care provider will monitor your condition (active surveillance or watchful waiting). Treatment for this condition will depend on the severity of your condition. Treatment may include: Observation and yearly exams. This may be the only treatment needed if your condition and symptoms are mild. Medicines to relieve your symptoms, including: Medicines to shrink the prostate. Medicines to relax the muscle of the prostate. Surgery in severe cases. Surgery may include: Prostatectomy. In this procedure, the prostate tissue is removed completely through an open incision or with a laparoscope or robotics. Transurethral resection of the prostate (TURP). In this procedure, a tool is inserted through the opening at the tip of the penis (urethra). It is used to cut away tissue of the inner core of the prostate. The pieces are removed through the same opening of the penis. This removes the blockage. Transurethral incision (TUIP). In this procedure, small cuts are made in the prostate. This lessens the prostate's pressure on the urethra. Transurethral microwave thermotherapy (TUMT). This procedure uses microwaves to create heat. The heat destroys and removes a small   amount of prostate tissue. Transurethral needle ablation (TUNA). This procedure uses radio frequencies to destroy and remove a small amount of prostate tissue. Interstitial laser coagulation (ILC).  This procedure uses a laser to destroy and remove a small amount of prostate tissue. Transurethral electrovaporization (TUVP). This procedure uses electrodes to destroy and remove a small amount of prostate tissue. Prostatic urethral lift. This procedure inserts an implant to push the lobes of the prostate away from the urethra. Follow these instructions at home: Take over-the-counter and prescription medicines only as told by your health care provider. Monitor your symptoms for any changes. Contact your health care provider with any changes. Avoid drinking large amounts of liquid before going to bed or out in public. Avoid or reduce how much caffeine or alcohol you drink. Give yourself time when you urinate. Keep all follow-up visits as told by your health care provider. This is important. Contact a health care provider if: You have unexplained back pain. Your symptoms do not get better with treatment. You develop side effects from the medicine you are taking. Your urine becomes very dark or has a bad smell. Your lower abdomen becomes distended and you have trouble passing your urine. Get help right away if: You have a fever or chills. You suddenly cannot urinate. You feel lightheaded, or very dizzy, or you faint. There are large amounts of blood or clots in the urine. Your urinary problems become hard to manage. You develop moderate to severe low back or flank pain. The flank is the side of your body between the ribs and the hip. These symptoms may represent a serious problem that is an emergency. Do not wait to see if the symptoms will go away. Get medical help right away. Call your local emergency services (911 in the [place] Do not drive yourself to the hospital. Summary Benign prostatic hyperplasia (BPH) is an enlarged prostate that is caused by the normal aging process and not by cancer. An enlarged prostate can press on the urethra. This can make it hard to pass urine. This  condition is part of a normal aging process and is more likely to develop in men over the age of 50 years. Get help right away if you suddenly cannot urinate. This information is not intended to replace advice given to you by your health care provider. Make sure you discuss any questions you have with your healthcare provider. Document Revised: 12/14/2019 Document Reviewed: 12/14/2019 Elsevier Patient Education  2022 Elsevier Inc.  

## 2020-12-18 DIAGNOSIS — I10 Essential (primary) hypertension: Secondary | ICD-10-CM | POA: Diagnosis not present

## 2020-12-18 DIAGNOSIS — E1021 Type 1 diabetes mellitus with diabetic nephropathy: Secondary | ICD-10-CM | POA: Diagnosis not present

## 2020-12-18 DIAGNOSIS — E7800 Pure hypercholesterolemia, unspecified: Secondary | ICD-10-CM | POA: Diagnosis not present

## 2020-12-19 DIAGNOSIS — E7800 Pure hypercholesterolemia, unspecified: Secondary | ICD-10-CM | POA: Diagnosis not present

## 2020-12-19 DIAGNOSIS — E1021 Type 1 diabetes mellitus with diabetic nephropathy: Secondary | ICD-10-CM | POA: Diagnosis not present

## 2020-12-19 DIAGNOSIS — I10 Essential (primary) hypertension: Secondary | ICD-10-CM | POA: Diagnosis not present

## 2021-01-16 DIAGNOSIS — C9190 Lymphoid leukemia, unspecified not having achieved remission: Secondary | ICD-10-CM | POA: Diagnosis not present

## 2021-01-16 DIAGNOSIS — E1021 Type 1 diabetes mellitus with diabetic nephropathy: Secondary | ICD-10-CM | POA: Diagnosis not present

## 2021-01-16 DIAGNOSIS — M1990 Unspecified osteoarthritis, unspecified site: Secondary | ICD-10-CM | POA: Diagnosis not present

## 2021-01-16 DIAGNOSIS — I10 Essential (primary) hypertension: Secondary | ICD-10-CM | POA: Diagnosis not present

## 2021-01-17 DIAGNOSIS — E1021 Type 1 diabetes mellitus with diabetic nephropathy: Secondary | ICD-10-CM | POA: Diagnosis not present

## 2021-01-17 DIAGNOSIS — I10 Essential (primary) hypertension: Secondary | ICD-10-CM | POA: Diagnosis not present

## 2021-01-17 DIAGNOSIS — E7800 Pure hypercholesterolemia, unspecified: Secondary | ICD-10-CM | POA: Diagnosis not present

## 2021-01-23 DIAGNOSIS — C44719 Basal cell carcinoma of skin of left lower limb, including hip: Secondary | ICD-10-CM | POA: Diagnosis not present

## 2021-01-23 DIAGNOSIS — R202 Paresthesia of skin: Secondary | ICD-10-CM | POA: Diagnosis not present

## 2021-01-23 DIAGNOSIS — D485 Neoplasm of uncertain behavior of skin: Secondary | ICD-10-CM | POA: Diagnosis not present

## 2021-01-23 DIAGNOSIS — C44319 Basal cell carcinoma of skin of other parts of face: Secondary | ICD-10-CM | POA: Diagnosis not present

## 2021-01-23 DIAGNOSIS — L570 Actinic keratosis: Secondary | ICD-10-CM | POA: Diagnosis not present

## 2021-01-23 DIAGNOSIS — L309 Dermatitis, unspecified: Secondary | ICD-10-CM | POA: Diagnosis not present

## 2021-01-23 DIAGNOSIS — D044 Carcinoma in situ of skin of scalp and neck: Secondary | ICD-10-CM | POA: Diagnosis not present

## 2021-02-04 DIAGNOSIS — Z79899 Other long term (current) drug therapy: Secondary | ICD-10-CM | POA: Diagnosis not present

## 2021-02-04 DIAGNOSIS — L409 Psoriasis, unspecified: Secondary | ICD-10-CM | POA: Diagnosis not present

## 2021-02-04 DIAGNOSIS — E109 Type 1 diabetes mellitus without complications: Secondary | ICD-10-CM | POA: Diagnosis not present

## 2021-02-04 DIAGNOSIS — L4050 Arthropathic psoriasis, unspecified: Secondary | ICD-10-CM | POA: Diagnosis not present

## 2021-02-04 DIAGNOSIS — M1990 Unspecified osteoarthritis, unspecified site: Secondary | ICD-10-CM | POA: Diagnosis not present

## 2021-02-04 DIAGNOSIS — C9190 Lymphoid leukemia, unspecified not having achieved remission: Secondary | ICD-10-CM | POA: Diagnosis not present

## 2021-02-04 DIAGNOSIS — M79672 Pain in left foot: Secondary | ICD-10-CM | POA: Diagnosis not present

## 2021-02-04 DIAGNOSIS — M79642 Pain in left hand: Secondary | ICD-10-CM | POA: Diagnosis not present

## 2021-02-04 DIAGNOSIS — Z23 Encounter for immunization: Secondary | ICD-10-CM | POA: Diagnosis not present

## 2021-02-04 DIAGNOSIS — M79641 Pain in right hand: Secondary | ICD-10-CM | POA: Diagnosis not present

## 2021-02-04 DIAGNOSIS — M79643 Pain in unspecified hand: Secondary | ICD-10-CM | POA: Diagnosis not present

## 2021-02-04 DIAGNOSIS — M79671 Pain in right foot: Secondary | ICD-10-CM | POA: Diagnosis not present

## 2021-02-04 DIAGNOSIS — M7989 Other specified soft tissue disorders: Secondary | ICD-10-CM | POA: Diagnosis not present

## 2021-02-05 ENCOUNTER — Other Ambulatory Visit: Payer: Self-pay | Admitting: Urology

## 2021-02-05 DIAGNOSIS — R350 Frequency of micturition: Secondary | ICD-10-CM

## 2021-02-10 ENCOUNTER — Telehealth: Payer: Self-pay

## 2021-02-10 ENCOUNTER — Other Ambulatory Visit: Payer: Self-pay

## 2021-02-10 DIAGNOSIS — R350 Frequency of micturition: Secondary | ICD-10-CM

## 2021-02-10 MED ORDER — SOLIFENACIN SUCCINATE 5 MG PO TABS: 5.0000 mg | ORAL_TABLET | Freq: Two times a day (BID) | ORAL | 3 refills | Status: DC

## 2021-02-10 NOTE — Telephone Encounter (Signed)
Pt calling about why he hasn't had a refill on:  solifenacin (VESICARE) 5 MG tablet  Please fill at:  [facility] [id number] - [place], [place] Phone:  [phone]  Fax:  [phone]     Thanks, Helene Kelp

## 2021-02-10 NOTE — Telephone Encounter (Signed)
Refilled submitted to pharmacy for patient.

## 2021-02-17 DIAGNOSIS — E7800 Pure hypercholesterolemia, unspecified: Secondary | ICD-10-CM | POA: Diagnosis not present

## 2021-02-17 DIAGNOSIS — I10 Essential (primary) hypertension: Secondary | ICD-10-CM | POA: Diagnosis not present

## 2021-02-17 DIAGNOSIS — E1021 Type 1 diabetes mellitus with diabetic nephropathy: Secondary | ICD-10-CM | POA: Diagnosis not present

## 2021-02-18 DIAGNOSIS — E1021 Type 1 diabetes mellitus with diabetic nephropathy: Secondary | ICD-10-CM | POA: Diagnosis not present

## 2021-02-25 DIAGNOSIS — Z9641 Presence of insulin pump (external) (internal): Secondary | ICD-10-CM | POA: Diagnosis not present

## 2021-02-25 DIAGNOSIS — E871 Hypo-osmolality and hyponatremia: Secondary | ICD-10-CM | POA: Diagnosis not present

## 2021-02-25 DIAGNOSIS — I10 Essential (primary) hypertension: Secondary | ICD-10-CM | POA: Diagnosis not present

## 2021-02-25 DIAGNOSIS — E1021 Type 1 diabetes mellitus with diabetic nephropathy: Secondary | ICD-10-CM | POA: Diagnosis not present

## 2021-03-04 DIAGNOSIS — E871 Hypo-osmolality and hyponatremia: Secondary | ICD-10-CM | POA: Diagnosis not present

## 2021-03-18 DIAGNOSIS — I10 Essential (primary) hypertension: Secondary | ICD-10-CM | POA: Diagnosis not present

## 2021-03-18 DIAGNOSIS — M79646 Pain in unspecified finger(s): Secondary | ICD-10-CM | POA: Diagnosis not present

## 2021-03-18 DIAGNOSIS — C9190 Lymphoid leukemia, unspecified not having achieved remission: Secondary | ICD-10-CM | POA: Diagnosis not present

## 2021-03-18 DIAGNOSIS — M7989 Other specified soft tissue disorders: Secondary | ICD-10-CM | POA: Diagnosis not present

## 2021-03-18 DIAGNOSIS — L4050 Arthropathic psoriasis, unspecified: Secondary | ICD-10-CM | POA: Diagnosis not present

## 2021-03-18 DIAGNOSIS — E109 Type 1 diabetes mellitus without complications: Secondary | ICD-10-CM | POA: Diagnosis not present

## 2021-03-18 DIAGNOSIS — M79643 Pain in unspecified hand: Secondary | ICD-10-CM | POA: Diagnosis not present

## 2021-03-18 DIAGNOSIS — Z0000 Encounter for general adult medical examination without abnormal findings: Secondary | ICD-10-CM | POA: Diagnosis not present

## 2021-03-18 DIAGNOSIS — M1990 Unspecified osteoarthritis, unspecified site: Secondary | ICD-10-CM | POA: Diagnosis not present

## 2021-03-18 DIAGNOSIS — Z79899 Other long term (current) drug therapy: Secondary | ICD-10-CM | POA: Diagnosis not present

## 2021-03-18 DIAGNOSIS — L409 Psoriasis, unspecified: Secondary | ICD-10-CM | POA: Diagnosis not present

## 2021-03-27 DIAGNOSIS — Z23 Encounter for immunization: Secondary | ICD-10-CM | POA: Diagnosis not present

## 2021-04-01 DIAGNOSIS — D044 Carcinoma in situ of skin of scalp and neck: Secondary | ICD-10-CM | POA: Diagnosis not present

## 2021-04-29 DIAGNOSIS — C44319 Basal cell carcinoma of skin of other parts of face: Secondary | ICD-10-CM | POA: Diagnosis not present

## 2021-05-01 DIAGNOSIS — L821 Other seborrheic keratosis: Secondary | ICD-10-CM | POA: Diagnosis not present

## 2021-05-01 DIAGNOSIS — Z85828 Personal history of other malignant neoplasm of skin: Secondary | ICD-10-CM | POA: Diagnosis not present

## 2021-05-01 DIAGNOSIS — C44719 Basal cell carcinoma of skin of left lower limb, including hip: Secondary | ICD-10-CM | POA: Diagnosis not present

## 2021-05-01 DIAGNOSIS — L570 Actinic keratosis: Secondary | ICD-10-CM | POA: Diagnosis not present

## 2021-05-01 DIAGNOSIS — L814 Other melanin hyperpigmentation: Secondary | ICD-10-CM | POA: Diagnosis not present

## 2021-05-01 DIAGNOSIS — D225 Melanocytic nevi of trunk: Secondary | ICD-10-CM | POA: Diagnosis not present

## 2021-05-01 DIAGNOSIS — C44619 Basal cell carcinoma of skin of left upper limb, including shoulder: Secondary | ICD-10-CM | POA: Diagnosis not present

## 2021-05-01 DIAGNOSIS — Z08 Encounter for follow-up examination after completed treatment for malignant neoplasm: Secondary | ICD-10-CM | POA: Diagnosis not present

## 2021-05-01 DIAGNOSIS — D485 Neoplasm of uncertain behavior of skin: Secondary | ICD-10-CM | POA: Diagnosis not present

## 2021-05-01 DIAGNOSIS — D1801 Hemangioma of skin and subcutaneous tissue: Secondary | ICD-10-CM | POA: Diagnosis not present

## 2021-05-06 DIAGNOSIS — E109 Type 1 diabetes mellitus without complications: Secondary | ICD-10-CM | POA: Diagnosis not present

## 2021-05-06 DIAGNOSIS — C9190 Lymphoid leukemia, unspecified not having achieved remission: Secondary | ICD-10-CM | POA: Diagnosis not present

## 2021-05-06 DIAGNOSIS — Z79899 Other long term (current) drug therapy: Secondary | ICD-10-CM | POA: Diagnosis not present

## 2021-05-06 DIAGNOSIS — L409 Psoriasis, unspecified: Secondary | ICD-10-CM | POA: Diagnosis not present

## 2021-05-06 DIAGNOSIS — M79646 Pain in unspecified finger(s): Secondary | ICD-10-CM | POA: Diagnosis not present

## 2021-05-06 DIAGNOSIS — M79643 Pain in unspecified hand: Secondary | ICD-10-CM | POA: Diagnosis not present

## 2021-05-06 DIAGNOSIS — L4050 Arthropathic psoriasis, unspecified: Secondary | ICD-10-CM | POA: Diagnosis not present

## 2021-05-06 DIAGNOSIS — M7989 Other specified soft tissue disorders: Secondary | ICD-10-CM | POA: Diagnosis not present

## 2021-05-06 DIAGNOSIS — M1990 Unspecified osteoarthritis, unspecified site: Secondary | ICD-10-CM | POA: Diagnosis not present

## 2021-05-15 DIAGNOSIS — Z20822 Contact with and (suspected) exposure to covid-19: Secondary | ICD-10-CM | POA: Diagnosis not present

## 2021-05-20 DIAGNOSIS — E1021 Type 1 diabetes mellitus with diabetic nephropathy: Secondary | ICD-10-CM | POA: Diagnosis not present

## 2021-05-20 DIAGNOSIS — E7800 Pure hypercholesterolemia, unspecified: Secondary | ICD-10-CM | POA: Diagnosis not present

## 2021-05-20 DIAGNOSIS — I10 Essential (primary) hypertension: Secondary | ICD-10-CM | POA: Diagnosis not present

## 2021-05-27 DIAGNOSIS — C44719 Basal cell carcinoma of skin of left lower limb, including hip: Secondary | ICD-10-CM | POA: Diagnosis not present

## 2021-05-28 ENCOUNTER — Encounter: Payer: Self-pay | Admitting: Hematology

## 2021-05-29 DIAGNOSIS — Z0000 Encounter for general adult medical examination without abnormal findings: Secondary | ICD-10-CM | POA: Diagnosis not present

## 2021-05-29 DIAGNOSIS — R5383 Other fatigue: Secondary | ICD-10-CM | POA: Diagnosis not present

## 2021-05-29 DIAGNOSIS — C9190 Lymphoid leukemia, unspecified not having achieved remission: Secondary | ICD-10-CM | POA: Diagnosis not present

## 2021-05-29 DIAGNOSIS — E1021 Type 1 diabetes mellitus with diabetic nephropathy: Secondary | ICD-10-CM | POA: Diagnosis not present

## 2021-05-29 DIAGNOSIS — I10 Essential (primary) hypertension: Secondary | ICD-10-CM | POA: Diagnosis not present

## 2021-06-05 DIAGNOSIS — I10 Essential (primary) hypertension: Secondary | ICD-10-CM | POA: Diagnosis not present

## 2021-06-05 DIAGNOSIS — I700 Atherosclerosis of aorta: Secondary | ICD-10-CM | POA: Diagnosis not present

## 2021-06-05 DIAGNOSIS — E1021 Type 1 diabetes mellitus with diabetic nephropathy: Secondary | ICD-10-CM | POA: Diagnosis not present

## 2021-06-05 DIAGNOSIS — G912 (Idiopathic) normal pressure hydrocephalus: Secondary | ICD-10-CM | POA: Diagnosis not present

## 2021-06-05 DIAGNOSIS — M21371 Foot drop, right foot: Secondary | ICD-10-CM | POA: Diagnosis not present

## 2021-06-05 DIAGNOSIS — L4050 Arthropathic psoriasis, unspecified: Secondary | ICD-10-CM | POA: Diagnosis not present

## 2021-06-05 DIAGNOSIS — E7800 Pure hypercholesterolemia, unspecified: Secondary | ICD-10-CM | POA: Diagnosis not present

## 2021-06-05 DIAGNOSIS — M1990 Unspecified osteoarthritis, unspecified site: Secondary | ICD-10-CM | POA: Diagnosis not present

## 2021-06-05 DIAGNOSIS — M21372 Foot drop, left foot: Secondary | ICD-10-CM | POA: Diagnosis not present

## 2021-06-05 DIAGNOSIS — C9190 Lymphoid leukemia, unspecified not having achieved remission: Secondary | ICD-10-CM | POA: Diagnosis not present

## 2021-06-06 DIAGNOSIS — E103593 Type 1 diabetes mellitus with proliferative diabetic retinopathy without macular edema, bilateral: Secondary | ICD-10-CM | POA: Diagnosis not present

## 2021-06-06 DIAGNOSIS — Z970 Presence of artificial eye: Secondary | ICD-10-CM | POA: Diagnosis not present

## 2021-06-06 DIAGNOSIS — Z961 Presence of intraocular lens: Secondary | ICD-10-CM | POA: Diagnosis not present

## 2021-06-06 DIAGNOSIS — H52201 Unspecified astigmatism, right eye: Secondary | ICD-10-CM | POA: Diagnosis not present

## 2021-06-13 ENCOUNTER — Ambulatory Visit: Payer: Medicare Other | Admitting: Urology

## 2021-06-16 ENCOUNTER — Ambulatory Visit: Payer: Medicare Other | Admitting: Urology

## 2021-06-17 ENCOUNTER — Ambulatory Visit: Payer: Medicare Other | Admitting: Urology

## 2021-06-17 DIAGNOSIS — E1021 Type 1 diabetes mellitus with diabetic nephropathy: Secondary | ICD-10-CM | POA: Diagnosis not present

## 2021-06-17 DIAGNOSIS — E7800 Pure hypercholesterolemia, unspecified: Secondary | ICD-10-CM | POA: Diagnosis not present

## 2021-06-17 DIAGNOSIS — C44719 Basal cell carcinoma of skin of left lower limb, including hip: Secondary | ICD-10-CM | POA: Diagnosis not present

## 2021-06-17 DIAGNOSIS — I10 Essential (primary) hypertension: Secondary | ICD-10-CM | POA: Diagnosis not present

## 2021-07-01 DIAGNOSIS — L570 Actinic keratosis: Secondary | ICD-10-CM | POA: Diagnosis not present

## 2021-07-01 DIAGNOSIS — C44619 Basal cell carcinoma of skin of left upper limb, including shoulder: Secondary | ICD-10-CM | POA: Diagnosis not present

## 2021-07-01 DIAGNOSIS — B079 Viral wart, unspecified: Secondary | ICD-10-CM | POA: Diagnosis not present

## 2021-07-01 DIAGNOSIS — D485 Neoplasm of uncertain behavior of skin: Secondary | ICD-10-CM | POA: Diagnosis not present

## 2021-07-02 ENCOUNTER — Other Ambulatory Visit: Payer: Self-pay

## 2021-07-02 ENCOUNTER — Ambulatory Visit (INDEPENDENT_AMBULATORY_CARE_PROVIDER_SITE_OTHER): Payer: Medicare Other | Admitting: Urology

## 2021-07-02 ENCOUNTER — Encounter: Payer: Self-pay | Admitting: Urology

## 2021-07-02 VITALS — BP 126/70 | HR 70

## 2021-07-02 DIAGNOSIS — R350 Frequency of micturition: Secondary | ICD-10-CM

## 2021-07-02 DIAGNOSIS — R339 Retention of urine, unspecified: Secondary | ICD-10-CM

## 2021-07-02 DIAGNOSIS — N401 Enlarged prostate with lower urinary tract symptoms: Secondary | ICD-10-CM | POA: Diagnosis not present

## 2021-07-02 DIAGNOSIS — R351 Nocturia: Secondary | ICD-10-CM

## 2021-07-02 LAB — BLADDER SCAN AMB NON-IMAGING: Scan Result: 63

## 2021-07-02 NOTE — Patient Instructions (Signed)

## 2021-07-02 NOTE — Progress Notes (Signed)
? ?07/02/2021 ?3:33 PM  ? ?Christopher Conner ?[birth year] ?[id number] ? ?Referring provider: Merrilee Seashore, MD ?[place],  [place] [id number] ? ?Followup nocturia and urinary frequency ? ? ?HPI: ?Christopher Conner is a 71yo here for followup for nocturia and urinary frequency. He remains on vesicare '5mg'$  BID. He has nocturia 3-4x. The volumes at night are large. Urinary frequency is every 2-3 hours. IPSS 14 QOL 3. Urine stream strong. No urinary hesitancy.  ? ? ?PMH: ?Past Medical History:  ?Diagnosis Date  ? Arthritis   ? BPH (benign prostatic hyperplasia)   ? 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 no current treatment  ? CLL (chronic lymphocytic leukemia) ([place])   ? DM type 1 (diabetes mellitus, type 1) ([place])   ? type 1  ? High cholesterol   ? Hypertension   ? 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 brain shut inserted  ? ? ?Surgical History: ?Past Surgical History:  ?Procedure Laterality Date  ? APPENDECTOMY  early 20's  ? BACK SURGERY    ? CATARACT EXTRACTION Bilateral   ? left eye prostheisi "shell"  ? CYST REMOVAL NECK  yrs ago  ? CYSTOSCOPY WITH INSERTION OF UROLIFT  april/march 2020  ? done in office  ? CYSTOSCOPY WITH INSERTION OF UROLIFT [name] 10/12/2019  ? Procedure: CYSTOSCOPY WITH INSERTION OF UROLIFT;  Surgeon: Cleon Gustin, MD;  Location: [facility];  Service: Urology;  Laterality: [name];  ? [place] and 2000  ? l5 to s1 1998, then l4 and l5 in 2000  ? VENTRICULOPERITONEAL SHUNT  2006  ? ? ?Home Medications:  ?Allergies as of 07/02/2021   ? ?   Reactions  ? Latex Itching, Rash  ? ?  ? ?  ?Medication List  ?  ? ?  ? Accurate as of July 02, 2021  3:33 PM. If you have any questions, ask your nurse or doctor.  ?  ?  ? ?  ? ?Acetyl L-Carnitine 250 MG Caps ?1 capsule ?  ?Alpha-Lipoic Acid 600 MG Caps ?1 capsule ?  ?amLODipine 10 MG tablet ?Commonly known as: NORVASC ?Take 1 tablet by mouth daily. ?  ?amLODipine 10 MG tablet ?Commonly known as:  NORVASC ?every evening. ?  ?aspirin 81 MG tablet ?Take 81 mg by mouth daily. ?  ?Astragalus Root Powd ?See admin instructions. ?  ?atorvastatin 40 MG tablet ?Commonly known as: LIPITOR ?Take 40 mg by mouth every evening. ?  ?azithromycin 250 MG tablet ?Commonly known as: ZITHROMAX ?Take 250 mg by mouth as directed. ?  ?B-100 COMPLEX CR PO ?1 tablet ?  ?brompheniramine-pseudoephedrine-DM 30-2-10 MG/5ML syrup ?Take 5 mLs by mouth 3 (three) times daily as needed. ?  ?Contour Next Test test strip ?Generic drug: glucose blood ?2 (two) times daily. ?  ?CoQ-10 100 MG capsule ?See admin instructions. ?  ?doxycycline 100 MG tablet ?Commonly known as: VIBRA-TABS ?Take 1 tablet (100 mg total) by mouth 2 (two) times daily. ?  ?fluticasone 50 MCG/ACT nasal spray ?Commonly known as: FLONASE ?Place 2 sprays into both nostrils daily. ?  ?folic acid 1 MG tablet ?Commonly known as: FOLVITE ?Take 1 mg by mouth daily. ?  ?Green Tea 150 MG Caps ?See admin instructions. ?  ?ketoconazole 2 % cream ?Commonly known as: NIZORAL ?Apply 1 fingertip amount to each foot daily. ?  ?lisinopril 40 MG tablet ?Commonly known as: ZESTRIL ?Take 40 mg by mouth daily. ?  ?lisinopril-hydrochlorothiazide 20-25 MG tablet ?Commonly  known as: ZESTORETIC ?Take 1 tablet by mouth daily. ?  ?meclizine 25 MG tablet ?Commonly known as: ANTIVERT ?Take 1 tablet (25 mg total) by mouth 3 (three) times daily as needed for dizziness. ?  ?meloxicam 15 MG tablet ?Commonly known as: MOBIC ?Take 15 mg by mouth daily. ?  ?methotrexate 2.5 MG tablet ?Commonly known as: RHEUMATREX ?TAKE 8 TABLETS BY MOUTH 1 TIME A WEEK AS DIRECTED ?  ?neomycin-polymyxin-hydrocortisone OTIC solution ?Commonly known as: CORTISPORIN ?Apply 2 drops to the ingrown toenail site twice daily. Cover with band-aid. ?  ?NovoLOG 100 UNIT/ML injection ?Generic drug: insulin aspart ?INJECT UP TO 100 UNITS DAILY MAXIMUM DOSE WITH INSULIN PUMP DAILY ?  ?OMEGA 3-6-9 FATTY ACIDS PO ?See admin instructions. ?   ?ondansetron 8 MG disintegrating tablet ?Commonly known as: Zofran ODT ?'8mg'$  ODT q6 hours prn nausea ?  ?OVER THE COUNTER MEDICATION ?Omega 369 1 bid ?  ?OVER THE COUNTER MEDICATION ?Vitamin c 1000 mg daily ?  ?OVER THE COUNTER MEDICATION ?Vitamin d 3 2000 iu daily ?  ?OVER THE COUNTER MEDICATION ?probiotiotic qday ?  ?OVER THE COUNTER MEDICATION ?Methyl folate 1000 mg qd ?  ?OVER THE COUNTER MEDICATION ?Green tea extract 400 mg qd ?  ?OVER THE COUNTER MEDICATION ?Astragalis root 470 mg daily ?  ?OVER THE COUNTER MEDICATION ?Grape seed extract 200 mg qd ?  ?OVER THE COUNTER MEDICATION ?Alpha lipoic acid '600mg'$  qd ?  ?OVER THE COUNTER MEDICATION ?Acetyl l-caritine 500 gm qd ?  ?OVER THE COUNTER MEDICATION ?reservetrol 50 mg qd ?  ?OVER THE COUNTER MEDICATION ?coq10 120 mg qd ?  ?OVER THE COUNTER MEDICATION ?b complex 100 mg qd ?  ?OVER THE COUNTER MEDICATION ?Calcium 630 mg qd ?  ?REFRESH OP ?Apply 1-2 drops to eye every 3 (three) hours as needed (dryness). ?  ?solifenacin 5 MG tablet ?Commonly known as: VESICARE ?Take 1 tablet (5 mg total) by mouth in the morning and at bedtime. ?  ?solifenacin 5 MG tablet ?Commonly known as: VESICARE ?TAKE ONE TABLET BY MOUTH EVERY MORNING AND TAKE ONE TABLET BY MOUTH EVERY NIGHT AT BEDTIME ?  ?telmisartan 40 MG tablet ?Commonly known as: MICARDIS ?1 tablet ?  ?tiZANidine 2 MG tablet ?Commonly known as: ZANAFLEX ?Take 2 mg by mouth 3 (three) times daily as needed. ?  ?Vitamin C & D3/Rose Hips [phone] MG-UNIT-MG Caps ?Generic drug: Vit C-Cholecalciferol-Rose Hip ?See admin instructions. ?  ?Vitamin D 50 MCG (2000 UT) Caps ?1 capsule ?  ? ?  ? ? ?Allergies:  ?Allergies  ?Allergen Reactions  ? Latex Itching and Rash  ? ? ?Family History: ?Family History  ?Problem Relation Age of Onset  ? Hypertension Mother   ? Hyperlipidemia Mother   ? Diabetes Mother   ? Diabetes Father   ? ? ?Social History:  reports that he has never smoked. He has never used smokeless tobacco. He reports that  he does not drink alcohol and does not use drugs. ? ?ROS: ?All other review of systems were reviewed and are negative except what is noted above in HPI ? ?Physical Exam: ?BP 126/70   Pulse 70   ?Constitutional:  Alert and oriented, No acute distress. ?HEENT: [place] AT, moist mucus membranes.  Trachea midline, no masses. ?Cardiovascular: No clubbing, cyanosis, or edema. ?Respiratory: Normal respiratory effort, no increased work of breathing. ?GI: Abdomen is soft, nontender, nondistended, no abdominal masses ?GU: No CVA tenderness.  ?Lymph: No cervical or inguinal lymphadenopathy. ?Skin: No rashes, bruises or suspicious lesions. ?Neurologic:  Grossly intact, no focal deficits, moving all 4 extremities. ?Psychiatric: Normal mood and affect. ? ?Laboratory Data: ?Lab Results  ?Component Value Date  ? WBC 17.9 (H) 08/16/2020  ? HGB 12.8 (L) 08/16/2020  ? HCT 38.9 (L) 08/16/2020  ? MCV 98.7 08/16/2020  ? PLT 306 08/16/2020  ? ? ?Lab Results  ?Component Value Date  ? CREATININE 0.92 08/16/2020  ? ? ?No results found for: PSA ? ?No results found for: TESTOSTERONE ? ?Lab Results  ?Component Value Date  ? HGBA1C 6.7 (H) 04/10/2014  ? ? ?Urinalysis ?   ?Component Value Date/Time  ? [place] YELLOW 08/16/2015 0244  ? APPEARANCEUR Clear 01/03/2020 1537  ? LABSPEC 1.029 08/16/2015 0244  ? PHURINE 5.5 08/16/2015 0244  ? GLUCOSEU Negative 01/03/2020 1537  ? [place] NEGATIVE 08/16/2015 0244  ? BILIRUBINUR Negative 01/03/2020 1537  ? KETONESUR >80 (A) 08/16/2015 0244  ? PROTEINUR Negative 01/03/2020 1537  ? [place] NEGATIVE 08/16/2015 0244  ? UROBILINOGEN 0.2 04/09/2014 1947  ? NITRITE Negative 01/03/2020 1537  ? NITRITE NEGATIVE 08/16/2015 0244  ? LEUKOCYTESUR Negative 01/03/2020 1537  ? ? ?Lab Results  ?Component Value Date  ? LABMICR Comment 01/03/2020  ? BACTERIA RARE (A) 08/16/2015  ? ? ?Pertinent Imaging: ? ?Results for orders placed during the hospital encounter of 05/23/10 ? ?DG Abd 1 View ? ?Narrative ?*RADIOLOGY  REPORT* ? ?Clinical Data: Weakness with nausea and vomiting. ? ?ABDOMEN - 1 VIEW ? ?Comparison: 02/10/2010. ? 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  There is no shunt discontinui

## 2021-07-02 NOTE — Progress Notes (Signed)
post void residual =63 

## 2021-07-08 DIAGNOSIS — M1990 Unspecified osteoarthritis, unspecified site: Secondary | ICD-10-CM | POA: Diagnosis not present

## 2021-07-08 DIAGNOSIS — C9190 Lymphoid leukemia, unspecified not having achieved remission: Secondary | ICD-10-CM | POA: Diagnosis not present

## 2021-07-08 DIAGNOSIS — L4050 Arthropathic psoriasis, unspecified: Secondary | ICD-10-CM | POA: Diagnosis not present

## 2021-07-08 DIAGNOSIS — M79646 Pain in unspecified finger(s): Secondary | ICD-10-CM | POA: Diagnosis not present

## 2021-07-08 DIAGNOSIS — E109 Type 1 diabetes mellitus without complications: Secondary | ICD-10-CM | POA: Diagnosis not present

## 2021-07-08 DIAGNOSIS — L409 Psoriasis, unspecified: Secondary | ICD-10-CM | POA: Diagnosis not present

## 2021-07-08 DIAGNOSIS — M79643 Pain in unspecified hand: Secondary | ICD-10-CM | POA: Diagnosis not present

## 2021-07-08 DIAGNOSIS — Z79899 Other long term (current) drug therapy: Secondary | ICD-10-CM | POA: Diagnosis not present

## 2021-07-08 DIAGNOSIS — M7989 Other specified soft tissue disorders: Secondary | ICD-10-CM | POA: Diagnosis not present

## 2021-07-18 DIAGNOSIS — E7800 Pure hypercholesterolemia, unspecified: Secondary | ICD-10-CM | POA: Diagnosis not present

## 2021-07-18 DIAGNOSIS — I10 Essential (primary) hypertension: Secondary | ICD-10-CM | POA: Diagnosis not present

## 2021-07-18 DIAGNOSIS — E1021 Type 1 diabetes mellitus with diabetic nephropathy: Secondary | ICD-10-CM | POA: Diagnosis not present

## 2021-08-06 DIAGNOSIS — S81802A Unspecified open wound, left lower leg, initial encounter: Secondary | ICD-10-CM | POA: Diagnosis not present

## 2021-08-14 ENCOUNTER — Other Ambulatory Visit: Payer: Self-pay

## 2021-08-14 DIAGNOSIS — C9110 Chronic lymphocytic leukemia of B-cell type not having achieved remission: Secondary | ICD-10-CM

## 2021-08-15 ENCOUNTER — Inpatient Hospital Stay: Payer: Medicare Other | Attending: Hematology | Admitting: Hematology

## 2021-08-15 ENCOUNTER — Other Ambulatory Visit: Payer: Self-pay

## 2021-08-15 ENCOUNTER — Inpatient Hospital Stay: Payer: Medicare Other

## 2021-08-15 VITALS — BP 137/48 | HR 59 | Temp 98.1°F | Resp 20 | Wt 183.6 lb

## 2021-08-15 DIAGNOSIS — E875 Hyperkalemia: Secondary | ICD-10-CM | POA: Diagnosis not present

## 2021-08-15 DIAGNOSIS — C9110 Chronic lymphocytic leukemia of B-cell type not having achieved remission: Secondary | ICD-10-CM

## 2021-08-15 LAB — CBC WITH DIFFERENTIAL (CANCER CENTER ONLY)
Abs Immature Granulocytes: 0.03 10*3/uL (ref 0.00–0.07)
Basophils Absolute: 0.1 10*3/uL (ref 0.0–0.1)
Basophils Relative: 0 %
Eosinophils Absolute: 0.2 10*3/uL (ref 0.0–0.5)
Eosinophils Relative: 1 %
HCT: 37.4 % — ABNORMAL LOW (ref 39.0–52.0)
Hemoglobin: 12.7 g/dL — ABNORMAL LOW (ref 13.0–17.0)
Immature Granulocytes: 0 %
Lymphocytes Relative: 75 %
Lymphs Abs: 14.1 10*3/uL — ABNORMAL HIGH (ref 0.7–4.0)
MCH: 33.8 pg (ref 26.0–34.0)
MCHC: 34 g/dL (ref 30.0–36.0)
MCV: 99.5 fL (ref 80.0–100.0)
Monocytes Absolute: 0.5 10*3/uL (ref 0.1–1.0)
Monocytes Relative: 3 %
Neutro Abs: 4.1 10*3/uL (ref 1.7–7.7)
Neutrophils Relative %: 21 %
Platelet Count: 282 10*3/uL (ref 150–400)
RBC: 3.76 MIL/uL — ABNORMAL LOW (ref 4.22–5.81)
RDW: 13.1 % (ref 11.5–15.5)
Smear Review: NORMAL
WBC Count: 19 10*3/uL — ABNORMAL HIGH (ref 4.0–10.5)
nRBC: 0 % (ref 0.0–0.2)

## 2021-08-15 LAB — CMP (CANCER CENTER ONLY)
ALT: 23 U/L (ref 0–44)
AST: 22 U/L (ref 15–41)
Albumin: 4.1 g/dL (ref 3.5–5.0)
Alkaline Phosphatase: 54 U/L (ref 38–126)
Anion gap: 2 — ABNORMAL LOW (ref 5–15)
BUN: 19 mg/dL (ref 8–23)
CO2: 30 mmol/L (ref 22–32)
Calcium: 9.3 mg/dL (ref 8.9–10.3)
Chloride: 101 mmol/L (ref 98–111)
Creatinine: 0.87 mg/dL (ref 0.61–1.24)
GFR, Estimated: >60 mL/min (ref >60)
Glucose, Bld: 98 mg/dL (ref 70–99)
Potassium: 5.5 mmol/L — ABNORMAL HIGH (ref 3.5–5.1)
Sodium: 133 mmol/L — ABNORMAL LOW (ref 135–145)
Total Bilirubin: 0.5 mg/dL (ref 0.3–1.2)
Total Protein: 6.5 g/dL (ref 6.5–8.1)

## 2021-08-15 LAB — LACTATE DEHYDROGENASE: LDH: 150 U/L (ref 98–192)

## 2021-08-15 NOTE — Progress Notes (Signed)
? ? ? ?HEMATOLOGY/ONCOLOGY CLINIC NOTE ? ?Date of Service: 08/15/2021 ? ? ?Patient Care Team: ? ?Dr. Merrilee Seashore as PCP ? ?CHIEF COMPLAINTS:  ?Follow-up for continued valuation management of CLL ? ?HISTORY OF PRESENTING ILLNESS: plz see initial consultation for details on initial presentation ? ?INTERVAL HISTORY ? ?Christopher Conner  is here for his 1 year follow-up for continued evaluation and management of CLL. ?He notes he has been feeling well overall.  Did have COVID 19 infection in January 2023. ?Denies any new lumps or bumps.  No fevers no chills no night sweats no unexpected weight loss. 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 ?Notes no other acute new symptoms. ?No significant weight loss. ? ?MEDICAL HISTORY:  ?Past Medical History:  ?Diagnosis Date  ? Arthritis   ? BPH (benign prostatic hyperplasia)   ? Chronic lymphocytic leukemia ([place]) early 2018  ? no current treatment  ? CLL (chronic lymphocytic leukemia) ([place])   ? DM type 1 (diabetes mellitus, type 1) ([place])   ? type 1  ? High cholesterol   ? Hypertension   ? 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 brain shut inserted  ? ? ?SURGICAL HISTORY: ?Past Surgical History:  ?Procedure Laterality Date  ? APPENDECTOMY  early 20's  ? BACK SURGERY    ? CATARACT EXTRACTION Bilateral   ? left eye prostheisi "shell"  ? CYST REMOVAL NECK  yrs ago  ? CYSTOSCOPY WITH INSERTION OF UROLIFT  april/march 2020  ? done in office  ? CYSTOSCOPY WITH INSERTION OF UROLIFT [name] 10/12/2019  ? Procedure: CYSTOSCOPY WITH INSERTION OF UROLIFT;  Surgeon: Cleon Gustin, MD;  Location: [facility];  Service: Urology;  Laterality: [name];  ? [place] and 2000  ? l5 to s1 1998, then l4 and l5 in 2000  ? VENTRICULOPERITONEAL SHUNT  2006  ? ? ?SOCIAL HISTORY: ?Social History  ? ?Socioeconomic History  ? Marital status: Married  ?  Spouse name: Not on file  ? Number of children: Not on file  ? Years of education:  Not on file  ? Highest education level: Not on file  ?Occupational History  ? Occupation: Building services engineer  ?  Comment: [employer] (employer)  ?Tobacco Use  ? Smoking status: Never  ? Smokeless tobacco: Never  ?Vaping Use  ? Vaping Use: Never used  ?Substance and Sexual Activity  ? Alcohol use: No  ? Drug use: No  ? Sexual activity: Not on file  ?Other Topics Concern  ? Not on file  ?Social History Narrative  ? Not on file  ? ?Social Determinants of Health  ? ?Financial Resource Strain: Not on file  ?Food Insecurity: Not on file  ?Transportation Needs: Not on file  ?Physical Activity: Not on file  ?Stress: Not on file  ?Social Connections: Not on file  ?Intimate Partner Violence: Not on file  ? ? ?FAMILY HISTORY: ?Family History  ?Problem Relation Age of Onset  ? Hypertension Mother   ? Hyperlipidemia Mother   ? Diabetes Mother   ? Diabetes Father   ? ? ?ALLERGIES:  is allergic to latex. ? ?MEDICATIONS:  ?Current Outpatient Medications  ?Medication Sig Dispense Refill  ? Acetylcarnitine HCl (ACETYL L-CARNITINE) 250 MG CAPS 1 capsule    ? Alpha-Lipoic Acid 600 MG CAPS 1 capsule    ? amLODipine (NORVASC) 10 MG tablet every evening.  (Patient not taking: Reported on 07/02/2021)  2  ? amLODipine (NORVASC) 10  MG tablet Take 1 tablet by mouth daily.    ? aspirin 81 MG tablet Take 81 mg by mouth daily. (Patient not taking: Reported on 01/03/2020)    ? atorvastatin (LIPITOR) 40 MG tablet Take 40 mg by mouth every evening.     ? azithromycin (ZITHROMAX) 250 MG tablet Take 250 mg by mouth as directed.    ? B Complex-Biotin-FA (B-100 COMPLEX CR PO) 1 tablet    ? brompheniramine-pseudoephedrine-DM 30-2-10 MG/5ML syrup Take 5 mLs by mouth 3 (three) times daily as needed.    ? Cholecalciferol (VITAMIN D) 50 MCG (2000 UT) CAPS 1 capsule    ? Coenzyme Q10 (COQ-10) 100 MG capsule See admin instructions.    ? CONTOUR NEXT TEST test strip 2 (two) times daily.    ? doxycycline (VIBRA-TABS) 100 MG tablet Take 1 tablet (100 mg  total) by mouth 2 (two) times daily. 20 tablet 0  ? fluticasone (FLONASE) 50 MCG/ACT nasal spray Place 2 sprays into both nostrils daily.    ? folic acid (FOLVITE) 1 MG tablet Take 1 mg by mouth daily.    ? Green Tea 150 MG CAPS See admin instructions.    ? ketoconazole (NIZORAL) 2 % cream Apply 1 fingertip amount to each foot daily. 30 g 0  ? lisinopril (ZESTRIL) 40 MG tablet Take 40 mg by mouth daily. (Patient not taking: Reported on 07/02/2021)    ? lisinopril-hydrochlorothiazide (ZESTORETIC) 20-25 MG tablet Take 1 tablet by mouth daily.  (Patient not taking: Reported on 08/16/2020)    ? meclizine (ANTIVERT) 25 MG tablet Take 1 tablet (25 mg total) by mouth 3 (three) times daily as needed for dizziness. 30 tablet 0  ? meloxicam (MOBIC) 15 MG tablet Take 15 mg by mouth daily.    ? methotrexate (RHEUMATREX) 2.5 MG tablet TAKE 8 TABLETS BY MOUTH 1 TIME A WEEK AS DIRECTED    ? neomycin-polymyxin-hydrocortisone (CORTISPORIN) OTIC solution Apply 2 drops to the ingrown toenail site twice daily. Cover with band-aid. 10 mL 0  ? NOVOLOG 100 UNIT/ML injection INJECT UP TO 100 UNITS DAILY MAXIMUM DOSE WITH INSULIN PUMP DAILY  1  ? OMEGA 3-6-9 FATTY ACIDS PO See admin instructions.    ? ondansetron (ZOFRAN ODT) 8 MG disintegrating tablet '8mg'$  ODT q6 hours prn nausea 8 tablet 0  ? OVER THE COUNTER MEDICATION Omega 369 1 bid    ? OVER THE COUNTER MEDICATION Vitamin c 1000 mg daily    ? OVER THE COUNTER MEDICATION Vitamin d 3 2000 iu daily    ? OVER THE COUNTER MEDICATION probiotiotic qday    ? OVER THE COUNTER MEDICATION Methyl folate 1000 mg qd    ? OVER THE COUNTER MEDICATION Green tea extract 400 mg qd    ? OVER THE COUNTER MEDICATION Astragalis root 470 mg daily    ? OVER THE COUNTER MEDICATION Grape seed extract 200 mg qd    ? OVER THE COUNTER MEDICATION Alpha lipoic acid '600mg'$  qd    ? OVER THE COUNTER MEDICATION Acetyl l-caritine 500 gm qd    ? OVER THE COUNTER MEDICATION reservetrol 50 mg qd    ? OVER THE COUNTER MEDICATION  coq10 120 mg qd    ? OVER THE COUNTER MEDICATION b complex 100 mg qd    ? OVER THE COUNTER MEDICATION Calcium 630 mg qd    ? Polyvinyl Alcohol-Povidone (REFRESH OP) Apply 1-2 drops to eye every 3 (three) hours as needed (dryness).     ? solifenacin (VESICARE)  5 MG tablet TAKE ONE TABLET BY MOUTH EVERY MORNING AND TAKE ONE TABLET BY MOUTH EVERY NIGHT AT BEDTIME 180 tablet 0  ? solifenacin (VESICARE) 5 MG tablet Take 1 tablet (5 mg total) by mouth in the morning and at bedtime. 180 tablet 3  ? telmisartan (MICARDIS) 40 MG tablet 1 tablet    ? tiZANidine (ZANAFLEX) 2 MG tablet Take 2 mg by mouth 3 (three) times daily as needed.    ? Tragacanth (ASTRAGALUS ROOT) POWD See admin instructions.    ? Vit C-Cholecalciferol-Rose Hip (VITAMIN C & D3/ROSE HIPS) [phone] MG-UNIT-MG CAPS See admin instructions.    ? ?No current facility-administered medications for this visit.  ? ? ?REVIEW OF SYSTEMS:   ?10 Point review of Systems was done is negative except as noted above. ? ?PHYSICAL EXAMINATION: ?ECOG PERFORMANCE STATUS: 1 - Symptomatic but completely ambulatory ? ?Vitals:  ? 08/15/21 1300  ?BP: (!) 137/48  ?Pulse: (!) 59  ?Resp: 20  ?Temp: 98.1 ?F (36.7 ?C)  ? ?Filed Weights  ? 08/15/21 1300  ?Weight: 183 lb 9.6 oz (83.3 kg)  ? ?.Body mass index is 26.34 kg/m?.  ? ?NAD ?GENERAL:alert, in no acute distress and comfortable ?SKIN: no acute rashes, no significant lesions ?EYES: conjunctiva are pink and non-injected, sclera anicteric ?OROPHARYNX: MMM, no exudates, no oropharyngeal erythema or ulceration ?NECK: supple, no JVD ?LYMPH:  no palpable lymphadenopathy in the cervical, axillary or inguinal regions ?LUNGS: clear to auscultation b/l with normal respiratory effort ?HEART: regular rate & rhythm ?ABDOMEN:  normoactive bowel sounds , non tender, not distended. ?Extremity: no pedal edema ?PSYCH: alert & oriented x 3 with fluent speech ?NEURO: no focal motor/sensory deficits ? ? ?LABORATORY DATA:  ?I have reviewed the data as  listed ? ?. ? ?  Latest Ref Rng & Units 08/15/2021  ? 12:51 PM 08/16/2020  ?  9:56 AM 10/12/2019  ?  6:45 AM  ?CBC  ?WBC 4.0 - 10.5 K/uL 19.0   17.9     ?Hemoglobin 13.0 - 17.0 g/dL 12.7   12.8   12.9

## 2021-08-28 NOTE — Progress Notes (Signed)
Contacted pt per Dr Irene Limbo: to let patient know his potassium level is elevated at 5.5. He should reduce potassium in his diet, avoid potassium supplements and NSAIDS .Maintain good po hydration of close to 2L/days  ?He is on Micardis and this can increase potassium levels. He needs to f/u with his PCP early this week for rpt labs and discussion regarding continuing Micardis if potassium still elevated.  ?Dr Irene Limbo is forwarding his labs to his PCP as well.  ?Pt acknowledged and verbalized understanding. Pt will discuss with PCP.  ?

## 2021-09-02 DIAGNOSIS — E7800 Pure hypercholesterolemia, unspecified: Secondary | ICD-10-CM | POA: Diagnosis not present

## 2021-09-02 DIAGNOSIS — I10 Essential (primary) hypertension: Secondary | ICD-10-CM | POA: Diagnosis not present

## 2021-09-02 DIAGNOSIS — E871 Hypo-osmolality and hyponatremia: Secondary | ICD-10-CM | POA: Diagnosis not present

## 2021-09-02 DIAGNOSIS — E1021 Type 1 diabetes mellitus with diabetic nephropathy: Secondary | ICD-10-CM | POA: Diagnosis not present

## 2021-09-02 DIAGNOSIS — Z9641 Presence of insulin pump (external) (internal): Secondary | ICD-10-CM | POA: Diagnosis not present

## 2021-09-08 DIAGNOSIS — M79643 Pain in unspecified hand: Secondary | ICD-10-CM | POA: Diagnosis not present

## 2021-09-08 DIAGNOSIS — M7989 Other specified soft tissue disorders: Secondary | ICD-10-CM | POA: Diagnosis not present

## 2021-09-08 DIAGNOSIS — L409 Psoriasis, unspecified: Secondary | ICD-10-CM | POA: Diagnosis not present

## 2021-09-08 DIAGNOSIS — C9190 Lymphoid leukemia, unspecified not having achieved remission: Secondary | ICD-10-CM | POA: Diagnosis not present

## 2021-09-08 DIAGNOSIS — L4050 Arthropathic psoriasis, unspecified: Secondary | ICD-10-CM | POA: Diagnosis not present

## 2021-09-08 DIAGNOSIS — Z79899 Other long term (current) drug therapy: Secondary | ICD-10-CM | POA: Diagnosis not present

## 2021-09-08 DIAGNOSIS — M79646 Pain in unspecified finger(s): Secondary | ICD-10-CM | POA: Diagnosis not present

## 2021-09-08 DIAGNOSIS — M1990 Unspecified osteoarthritis, unspecified site: Secondary | ICD-10-CM | POA: Diagnosis not present

## 2021-09-08 DIAGNOSIS — E109 Type 1 diabetes mellitus without complications: Secondary | ICD-10-CM | POA: Diagnosis not present

## 2021-09-16 DIAGNOSIS — Z79899 Other long term (current) drug therapy: Secondary | ICD-10-CM | POA: Diagnosis not present

## 2021-09-16 DIAGNOSIS — E871 Hypo-osmolality and hyponatremia: Secondary | ICD-10-CM | POA: Diagnosis not present

## 2021-10-13 DIAGNOSIS — E1021 Type 1 diabetes mellitus with diabetic nephropathy: Secondary | ICD-10-CM | POA: Diagnosis not present

## 2021-10-29 DIAGNOSIS — L570 Actinic keratosis: Secondary | ICD-10-CM | POA: Diagnosis not present

## 2021-10-29 DIAGNOSIS — D225 Melanocytic nevi of trunk: Secondary | ICD-10-CM | POA: Diagnosis not present

## 2021-10-29 DIAGNOSIS — L814 Other melanin hyperpigmentation: Secondary | ICD-10-CM | POA: Diagnosis not present

## 2021-10-29 DIAGNOSIS — L821 Other seborrheic keratosis: Secondary | ICD-10-CM | POA: Diagnosis not present

## 2021-10-29 DIAGNOSIS — C44619 Basal cell carcinoma of skin of left upper limb, including shoulder: Secondary | ICD-10-CM | POA: Diagnosis not present

## 2021-10-29 DIAGNOSIS — D485 Neoplasm of uncertain behavior of skin: Secondary | ICD-10-CM | POA: Diagnosis not present

## 2021-10-29 DIAGNOSIS — C44519 Basal cell carcinoma of skin of other part of trunk: Secondary | ICD-10-CM | POA: Diagnosis not present

## 2021-11-01 ENCOUNTER — Other Ambulatory Visit: Payer: Self-pay | Admitting: Urology

## 2021-11-26 DIAGNOSIS — C44519 Basal cell carcinoma of skin of other part of trunk: Secondary | ICD-10-CM | POA: Diagnosis not present

## 2021-12-09 DIAGNOSIS — E7800 Pure hypercholesterolemia, unspecified: Secondary | ICD-10-CM | POA: Diagnosis not present

## 2021-12-09 DIAGNOSIS — I700 Atherosclerosis of aorta: Secondary | ICD-10-CM | POA: Diagnosis not present

## 2021-12-09 DIAGNOSIS — M21371 Foot drop, right foot: Secondary | ICD-10-CM | POA: Diagnosis not present

## 2021-12-09 DIAGNOSIS — E1021 Type 1 diabetes mellitus with diabetic nephropathy: Secondary | ICD-10-CM | POA: Diagnosis not present

## 2021-12-09 DIAGNOSIS — I10 Essential (primary) hypertension: Secondary | ICD-10-CM | POA: Diagnosis not present

## 2021-12-09 DIAGNOSIS — L4050 Arthropathic psoriasis, unspecified: Secondary | ICD-10-CM | POA: Diagnosis not present

## 2021-12-10 DIAGNOSIS — C44619 Basal cell carcinoma of skin of left upper limb, including shoulder: Secondary | ICD-10-CM | POA: Diagnosis not present

## 2021-12-18 DIAGNOSIS — M21372 Foot drop, left foot: Secondary | ICD-10-CM | POA: Diagnosis not present

## 2021-12-18 DIAGNOSIS — I700 Atherosclerosis of aorta: Secondary | ICD-10-CM | POA: Diagnosis not present

## 2021-12-18 DIAGNOSIS — M21371 Foot drop, right foot: Secondary | ICD-10-CM | POA: Diagnosis not present

## 2021-12-18 DIAGNOSIS — L4050 Arthropathic psoriasis, unspecified: Secondary | ICD-10-CM | POA: Diagnosis not present

## 2021-12-18 DIAGNOSIS — C9190 Lymphoid leukemia, unspecified not having achieved remission: Secondary | ICD-10-CM | POA: Diagnosis not present

## 2021-12-18 DIAGNOSIS — E871 Hypo-osmolality and hyponatremia: Secondary | ICD-10-CM | POA: Diagnosis not present

## 2021-12-18 DIAGNOSIS — G912 (Idiopathic) normal pressure hydrocephalus: Secondary | ICD-10-CM | POA: Diagnosis not present

## 2021-12-18 DIAGNOSIS — E1021 Type 1 diabetes mellitus with diabetic nephropathy: Secondary | ICD-10-CM | POA: Diagnosis not present

## 2021-12-18 DIAGNOSIS — I10 Essential (primary) hypertension: Secondary | ICD-10-CM | POA: Diagnosis not present

## 2021-12-18 DIAGNOSIS — E7800 Pure hypercholesterolemia, unspecified: Secondary | ICD-10-CM | POA: Diagnosis not present

## 2021-12-18 DIAGNOSIS — M1990 Unspecified osteoarthritis, unspecified site: Secondary | ICD-10-CM | POA: Diagnosis not present

## 2021-12-24 ENCOUNTER — Ambulatory Visit (INDEPENDENT_AMBULATORY_CARE_PROVIDER_SITE_OTHER): Payer: Medicare Other | Admitting: Urology

## 2021-12-24 ENCOUNTER — Encounter: Payer: Self-pay | Admitting: Urology

## 2021-12-24 VITALS — BP 146/75 | HR 65

## 2021-12-24 DIAGNOSIS — N401 Enlarged prostate with lower urinary tract symptoms: Secondary | ICD-10-CM

## 2021-12-24 DIAGNOSIS — R350 Frequency of micturition: Secondary | ICD-10-CM

## 2021-12-24 DIAGNOSIS — R351 Nocturia: Secondary | ICD-10-CM

## 2021-12-24 LAB — URINALYSIS, ROUTINE W REFLEX MICROSCOPIC
Bilirubin, UA: NEGATIVE
Glucose, UA: NEGATIVE
Ketones, UA: NEGATIVE
Leukocytes,UA: NEGATIVE
Nitrite, UA: NEGATIVE
Protein,UA: NEGATIVE
RBC, UA: NEGATIVE
Specific Gravity, UA: 1.015 (ref 1.005–1.030)
Urobilinogen, Ur: 1 mg/dL (ref 0.2–1.0)
pH, UA: 6 (ref 5.0–7.5)

## 2021-12-24 MED ORDER — SOLIFENACIN SUCCINATE 5 MG PO TABS: 5.0000 mg | ORAL_TABLET | Freq: Two times a day (BID) | ORAL | 3 refills | Status: DC

## 2021-12-24 NOTE — Patient Instructions (Signed)

## 2021-12-24 NOTE — Progress Notes (Signed)
12/24/2021 3:45 PM   Christopher Conner 1950-07-26 [id number]  Referring provider: Merrilee Seashore, [address] [place],  [place] [id number]  Followup nocturia   HPI: [name] is a 71yo here for followup for nocturia and urinary frequency. He did not get a sleep study. He continues to have nocturia 3-4x which are large volume. He does not drink any fluids within 2 hours of going to bed.Urine stream strong. No straining to urinate. Urinary frequency is every 2-3 hours. He is on vesicare '5mg'$  BID.    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    Surgical History: Past Surgical History:  Procedure Laterality Date   APPENDECTOMY  early 20's   BACK SURGERY     CATARACT EXTRACTION Bilateral    left eye prostheisi "shell"   CYST REMOVAL NECK  yrs ago   [place]  april/march 2020   done in office   [place] [name] 10/12/2019   Procedure: CYSTOSCOPY WITH INSERTION OF UROLIFT;  Surgeon: Cleon Gustin, MD;  Location: [facility];  Service: Urology;  Laterality: [name];   LUMBAR FUSION  1998 and 2000   l5 to s1 1998, then l4 and l5 in 2000   VENTRICULOPERITONEAL SHUNT  2006    Home Medications:  Allergies as of 12/24/2021       Reactions   Lisinopril Cough   Latex Itching, Rash        Medication List        Accurate as of December 24, 2021  3:45 PM. If you have any questions, ask your nurse or doctor.          Acetyl L-Carnitine 250 MG Caps 1 capsule   Alpha-Lipoic Acid 300 MG Caps 1 capsule   amLODipine 10 MG tablet Commonly known as: NORVASC Take  1 tablet by mouth daily.   Astragalus Root Powd See admin instructions.   atorvastatin 80 MG tablet Commonly known as: LIPITOR Take 40 mg by mouth every evening.   azithromycin 250 MG tablet Commonly known as: ZITHROMAX Take 250 mg by mouth as directed.   B-100 COMPLEX CR PO 1 tablet   brompheniramine-pseudoephedrine-DM 30-2-10 MG/5ML syrup Take 5 mLs by mouth 3 (three) times daily as needed.   Contour Next Test test strip Generic drug: glucose blood 2 (two) times daily.   CoQ-10 100 MG capsule See admin instructions.   doxycycline 100 MG tablet Commonly known as: VIBRA-TABS Take 1 tablet (100 mg total) by mouth 2 (two) times daily.   fluticasone 50 MCG/ACT nasal spray Commonly known as: FLONASE Place 2 sprays into both nostrils daily.   folic acid 1 MG tablet Commonly known as: FOLVITE Take 1 mg by mouth daily.   Green Tea 150 MG Caps See admin instructions.   ketoconazole 2 % cream Commonly known as: NIZORAL Apply 1 fingertip amount to each foot daily.   meclizine 25 MG tablet Commonly known as: ANTIVERT Take 1 tablet (25 mg total) by mouth 3 (three) times daily as needed for dizziness.   meloxicam 15  MG tablet Commonly known as: MOBIC Take 15 mg by mouth daily.   methotrexate 2.5 MG tablet Commonly known as: RHEUMATREX TAKE 8 TABLETS BY MOUTH 1 TIME A WEEK AS DIRECTED   neomycin-polymyxin-hydrocortisone OTIC solution Commonly known as: CORTISPORIN Apply 2 drops to the ingrown toenail site twice daily. Cover with band-aid.   NovoLOG 100 UNIT/ML injection Generic drug: insulin aspart INJECT UP TO 100 UNITS DAILY MAXIMUM DOSE WITH INSULIN PUMP DAILY   OMEGA 3-6-9 FATTY ACIDS PO See admin instructions.   ondansetron 8 MG disintegrating tablet Commonly known as: Zofran ODT '8mg'$  ODT q6 hours prn nausea   OVER THE COUNTER MEDICATION Omega 369 1 bid   OVER THE COUNTER MEDICATION Vitamin c 1000 mg daily   OVER THE COUNTER MEDICATION Vitamin d 3  2000 iu daily   OVER THE COUNTER MEDICATION probiotiotic qday   OVER THE COUNTER MEDICATION Methyl folate 1000 mg qd   OVER THE COUNTER MEDICATION Green tea extract 400 mg qd   OVER THE COUNTER MEDICATION Astragalis root 470 mg daily   OVER THE COUNTER MEDICATION Grape seed extract 200 mg qd   OVER THE COUNTER MEDICATION Alpha lipoic acid '600mg'$  qd   OVER THE COUNTER MEDICATION Acetyl l-caritine 500 gm qd   OVER THE COUNTER MEDICATION reservetrol 50 mg qd   OVER THE COUNTER MEDICATION coq10 120 mg qd   OVER THE COUNTER MEDICATION b complex 100 mg qd   OVER THE COUNTER MEDICATION Calcium 630 mg qd   REFRESH OP Apply 1-2 drops to eye every 3 (three) hours as needed (dryness).   solifenacin 5 MG tablet Commonly known as: VESICARE Take 1 tablet (5 mg total) by mouth in the morning and at bedtime.   solifenacin 5 MG tablet Commonly known as: VESICARE TAKE ONE TABLET BY MOUTH EVERY MORNING AND TAKE ONE TABLET BY MOUTH EVERY NIGHT AT BEDTIME   solifenacin 5 MG tablet Commonly known as: VESICARE TAKE ONE TABLET BY MOUTH EVERY MORNING AND TAKE ONE TABLET BY MOUTH EVERY NIGHT AT BEDTIME   telmisartan 40 MG tablet Commonly known as: MICARDIS 1 tablet   tiZANidine 2 MG tablet Commonly known as: ZANAFLEX Take 2 mg by mouth 3 (three) times daily as needed.   Vitamin C & D3/Rose Hips [phone] MG-UNIT-MG Caps Generic drug: Vit C-Cholecalciferol-Rose Hip See admin instructions.   Vitamin D 50 MCG (2000 UT) Caps 1 capsule        Allergies:  Allergies  Allergen Reactions   Lisinopril Cough   Latex Itching and Rash    Family History: Family History  Problem Relation Age of Onset   Hypertension Mother    Hyperlipidemia Mother    Diabetes Mother    Diabetes Father     Social History:  reports that he has never smoked. He has never used smokeless tobacco. He reports that he does not drink alcohol and does not use drugs.  ROS: All other review of  systems were reviewed and are negative except what is noted above in HPI  Physical Exam: BP (!) 146/75   Pulse 65   Constitutional:  Alert and oriented, No acute distress. HEENT: [place] AT, moist mucus membranes.  Trachea midline, no masses. Cardiovascular: No clubbing, cyanosis, or edema. Respiratory: Normal respiratory effort, no increased work of breathing. GI: Abdomen is soft, nontender, nondistended, no abdominal masses GU: No CVA tenderness.  Lymph: No cervical or inguinal lymphadenopathy. Skin: No rashes, bruises or suspicious lesions. Neurologic: Grossly intact, no focal deficits, moving all  4 extremities. Psychiatric: Normal mood and affect.  Laboratory Data: Lab Results  Component Value Date   WBC 19.0 (H) 08/15/2021   HGB 12.7 (L) 08/15/2021   HCT 37.4 (L) 08/15/2021   MCV 99.5 08/15/2021   PLT 282 08/15/2021    Lab Results  Component Value Date   CREATININE 0.87 08/15/2021    No results found for: "PSA"  No results found for: "TESTOSTERONE"  Lab Results  Component Value Date   HGBA1C 6.7 (H) 04/10/2014    Urinalysis    Component Value Date/Time   COLORURINE YELLOW 08/16/2015 0244   APPEARANCEUR Clear 01/03/2020 1537   LABSPEC 1.029 08/16/2015 0244   PHURINE 5.5 08/16/2015 0244   GLUCOSEU Negative 01/03/2020 [place] 08/16/2015 0244   BILIRUBINUR Negative 01/03/2020 1537   KETONESUR >80 (A) 08/16/2015 0244   PROTEINUR Negative 01/03/2020 1537   PROTEINUR NEGATIVE 08/16/2015 0244   UROBILINOGEN 0.2 04/09/2014 1947   NITRITE Negative 01/03/2020 1537   NITRITE NEGATIVE 08/16/2015 0244   LEUKOCYTESUR Negative 01/03/2020 1537    Lab Results  Component Value Date   LABMICR Comment 01/03/2020   BACTERIA RARE (A) 08/16/2015    Pertinent Imaging:  Results for orders placed during the hospital encounter of 05/23/10  DG Abd 1 View  Narrative *RADIOLOGY REPORT*  Clinical Data: Weakness with nausea and vomiting.  ABDOMEN - 1  VIEW  Comparison: 02/10/2010.  Findings: Ventriculoperitoneal shunt tubing extends into the right mid abdomen.  There is no shunt discontinuity.  Moderate stool is noted throughout the colon.  The bowel gas pattern is nonobstructive.  There are postsurgical changes of the lower lumbar spine status post fusion.  IMPRESSION:  1.  Moderate stool throughout the colon suggesting constipation. 2.  Intact ventricular peritoneal shunt.  Original Report Authenticated By: Vivia Ewing, M.D.  No results found for this or any previous visit.  No results found for this or any previous visit.  No results found for this or any previous visit.  No results found for this or any previous visit.  No results found for this or any previous visit.  No results found for this or any previous visit.  No results found for this or any previous visit.   Assessment & Plan:    1. Benign localized prostatic hyperplasia with lower urinary tract symptoms (LUTS) -improved after urolift - Urinalysis, Routine w reflex microscopic  2. Urinary frequency -continue vesicare '5mg'$  BID   3. Nocturia Vesicare '5mg'$  BID   No follow-ups on file.  Nicolette Bang, MD  [facility] [place]

## 2022-01-05 ENCOUNTER — Telehealth: Payer: Self-pay

## 2022-01-05 NOTE — Telephone Encounter (Signed)
Please advise on new medication   See below

## 2022-01-06 MED ORDER — FESOTERODINE FUMARATE ER 8 MG PO TB24: 8.0000 mg | ORAL_TABLET | Freq: Every day | ORAL | 3 refills | Status: DC

## 2022-01-06 MED ORDER — SOLIFENACIN SUCCINATE 5 MG PO TABS: ORAL_TABLET | ORAL | 3 refills | Status: DC

## 2022-01-06 NOTE — Addendum Note (Signed)
Addended by: Dorisann Frames on: 01/06/2022 02:10 PM   Modules accepted: Orders

## 2022-01-06 NOTE — Telephone Encounter (Signed)
Prescription sent to pharmacy. Called patient. No answer. Left detailed message of medication change sent to pharmacy.

## 2022-01-06 NOTE — Telephone Encounter (Signed)
Patient returned call to let Dr. Alyson Ingles know he is paying for vesicare with [facility] and would like to not fill toviaz.

## 2022-01-15 DIAGNOSIS — M21371 Foot drop, right foot: Secondary | ICD-10-CM | POA: Diagnosis not present

## 2022-01-15 DIAGNOSIS — M21372 Foot drop, left foot: Secondary | ICD-10-CM | POA: Diagnosis not present

## 2022-01-15 DIAGNOSIS — M545 Low back pain, unspecified: Secondary | ICD-10-CM | POA: Diagnosis not present

## 2022-01-15 DIAGNOSIS — E1021 Type 1 diabetes mellitus with diabetic nephropathy: Secondary | ICD-10-CM | POA: Diagnosis not present

## 2022-01-15 DIAGNOSIS — E1042 Type 1 diabetes mellitus with diabetic polyneuropathy: Secondary | ICD-10-CM | POA: Diagnosis not present

## 2022-03-02 DIAGNOSIS — L570 Actinic keratosis: Secondary | ICD-10-CM | POA: Diagnosis not present

## 2022-03-09 DIAGNOSIS — Z9641 Presence of insulin pump (external) (internal): Secondary | ICD-10-CM | POA: Diagnosis not present

## 2022-03-09 DIAGNOSIS — E1021 Type 1 diabetes mellitus with diabetic nephropathy: Secondary | ICD-10-CM | POA: Diagnosis not present

## 2022-03-09 DIAGNOSIS — E871 Hypo-osmolality and hyponatremia: Secondary | ICD-10-CM | POA: Diagnosis not present

## 2022-03-09 DIAGNOSIS — I10 Essential (primary) hypertension: Secondary | ICD-10-CM | POA: Diagnosis not present

## 2022-03-10 DIAGNOSIS — L4050 Arthropathic psoriasis, unspecified: Secondary | ICD-10-CM | POA: Diagnosis not present

## 2022-03-10 DIAGNOSIS — Z79899 Other long term (current) drug therapy: Secondary | ICD-10-CM | POA: Diagnosis not present

## 2022-03-10 DIAGNOSIS — M79646 Pain in unspecified finger(s): Secondary | ICD-10-CM | POA: Diagnosis not present

## 2022-03-10 DIAGNOSIS — M7989 Other specified soft tissue disorders: Secondary | ICD-10-CM | POA: Diagnosis not present

## 2022-03-10 DIAGNOSIS — L409 Psoriasis, unspecified: Secondary | ICD-10-CM | POA: Diagnosis not present

## 2022-03-10 DIAGNOSIS — C9190 Lymphoid leukemia, unspecified not having achieved remission: Secondary | ICD-10-CM | POA: Diagnosis not present

## 2022-03-10 DIAGNOSIS — E109 Type 1 diabetes mellitus without complications: Secondary | ICD-10-CM | POA: Diagnosis not present

## 2022-03-10 DIAGNOSIS — M1990 Unspecified osteoarthritis, unspecified site: Secondary | ICD-10-CM | POA: Diagnosis not present

## 2022-03-10 DIAGNOSIS — M79643 Pain in unspecified hand: Secondary | ICD-10-CM | POA: Diagnosis not present

## 2022-03-20 DIAGNOSIS — Z23 Encounter for immunization: Secondary | ICD-10-CM | POA: Diagnosis not present

## 2022-03-31 DIAGNOSIS — L570 Actinic keratosis: Secondary | ICD-10-CM | POA: Diagnosis not present

## 2022-03-31 DIAGNOSIS — L578 Other skin changes due to chronic exposure to nonionizing radiation: Secondary | ICD-10-CM | POA: Diagnosis not present

## 2022-04-05 ENCOUNTER — Encounter: Payer: Self-pay | Admitting: Hematology

## 2022-04-29 DIAGNOSIS — D485 Neoplasm of uncertain behavior of skin: Secondary | ICD-10-CM | POA: Diagnosis not present

## 2022-04-29 DIAGNOSIS — Z08 Encounter for follow-up examination after completed treatment for malignant neoplasm: Secondary | ICD-10-CM | POA: Diagnosis not present

## 2022-04-29 DIAGNOSIS — Z09 Encounter for follow-up examination after completed treatment for conditions other than malignant neoplasm: Secondary | ICD-10-CM | POA: Diagnosis not present

## 2022-04-29 DIAGNOSIS — L578 Other skin changes due to chronic exposure to nonionizing radiation: Secondary | ICD-10-CM | POA: Diagnosis not present

## 2022-04-29 DIAGNOSIS — C44622 Squamous cell carcinoma of skin of right upper limb, including shoulder: Secondary | ICD-10-CM | POA: Diagnosis not present

## 2022-04-29 DIAGNOSIS — L814 Other melanin hyperpigmentation: Secondary | ICD-10-CM | POA: Diagnosis not present

## 2022-04-29 DIAGNOSIS — Z85828 Personal history of other malignant neoplasm of skin: Secondary | ICD-10-CM | POA: Diagnosis not present

## 2022-04-29 DIAGNOSIS — L821 Other seborrheic keratosis: Secondary | ICD-10-CM | POA: Diagnosis not present

## 2022-04-29 DIAGNOSIS — L408 Other psoriasis: Secondary | ICD-10-CM | POA: Diagnosis not present

## 2022-04-29 DIAGNOSIS — D225 Melanocytic nevi of trunk: Secondary | ICD-10-CM | POA: Diagnosis not present

## 2022-05-12 ENCOUNTER — Telehealth: Payer: Self-pay | Admitting: Hematology

## 2022-05-12 NOTE — Telephone Encounter (Signed)
Patient called to r/s appointment. Patient r/s and notified

## 2022-05-25 DIAGNOSIS — C44622 Squamous cell carcinoma of skin of right upper limb, including shoulder: Secondary | ICD-10-CM | POA: Diagnosis not present

## 2022-06-09 ENCOUNTER — Inpatient Hospital Stay: Payer: Medicare Other

## 2022-06-09 ENCOUNTER — Inpatient Hospital Stay: Payer: Medicare Other | Admitting: Hematology

## 2022-06-09 DIAGNOSIS — M7989 Other specified soft tissue disorders: Secondary | ICD-10-CM | POA: Diagnosis not present

## 2022-06-09 DIAGNOSIS — C9190 Lymphoid leukemia, unspecified not having achieved remission: Secondary | ICD-10-CM | POA: Diagnosis not present

## 2022-06-09 DIAGNOSIS — L4050 Arthropathic psoriasis, unspecified: Secondary | ICD-10-CM | POA: Diagnosis not present

## 2022-06-09 DIAGNOSIS — Z79899 Other long term (current) drug therapy: Secondary | ICD-10-CM | POA: Diagnosis not present

## 2022-06-09 DIAGNOSIS — L409 Psoriasis, unspecified: Secondary | ICD-10-CM | POA: Diagnosis not present

## 2022-06-09 DIAGNOSIS — E109 Type 1 diabetes mellitus without complications: Secondary | ICD-10-CM | POA: Diagnosis not present

## 2022-06-09 DIAGNOSIS — M79646 Pain in unspecified finger(s): Secondary | ICD-10-CM | POA: Diagnosis not present

## 2022-06-09 DIAGNOSIS — M1990 Unspecified osteoarthritis, unspecified site: Secondary | ICD-10-CM | POA: Diagnosis not present

## 2022-06-09 DIAGNOSIS — M79643 Pain in unspecified hand: Secondary | ICD-10-CM | POA: Diagnosis not present

## 2022-06-10 ENCOUNTER — Other Ambulatory Visit: Payer: Self-pay

## 2022-06-10 DIAGNOSIS — C9110 Chronic lymphocytic leukemia of B-cell type not having achieved remission: Secondary | ICD-10-CM

## 2022-06-12 ENCOUNTER — Inpatient Hospital Stay (HOSPITAL_BASED_OUTPATIENT_CLINIC_OR_DEPARTMENT_OTHER): Payer: Medicare Other | Admitting: Hematology

## 2022-06-12 ENCOUNTER — Inpatient Hospital Stay: Payer: Medicare Other | Attending: Hematology

## 2022-06-12 VITALS — BP 153/62 | HR 57 | Temp 97.9°F | Resp 16 | Wt 171.5 lb

## 2022-06-12 DIAGNOSIS — C9110 Chronic lymphocytic leukemia of B-cell type not having achieved remission: Secondary | ICD-10-CM | POA: Diagnosis not present

## 2022-06-12 DIAGNOSIS — Z79899 Other long term (current) drug therapy: Secondary | ICD-10-CM | POA: Insufficient documentation

## 2022-06-12 DIAGNOSIS — L4052 Psoriatic arthritis mutilans: Secondary | ICD-10-CM | POA: Diagnosis not present

## 2022-06-12 LAB — CBC WITH DIFFERENTIAL (CANCER CENTER ONLY)
Abs Immature Granulocytes: 0.01 10*3/uL (ref 0.00–0.07)
Basophils Absolute: 0.1 10*3/uL (ref 0.0–0.1)
Basophils Relative: 0 %
Eosinophils Absolute: 0.2 10*3/uL (ref 0.0–0.5)
Eosinophils Relative: 1 %
HCT: 35.1 % — ABNORMAL LOW (ref 39.0–52.0)
Hemoglobin: 11.9 g/dL — ABNORMAL LOW (ref 13.0–17.0)
Immature Granulocytes: 0 %
Lymphocytes Relative: 72 %
Lymphs Abs: 11.3 10*3/uL — ABNORMAL HIGH (ref 0.7–4.0)
MCH: 34.6 pg — ABNORMAL HIGH (ref 26.0–34.0)
MCHC: 33.9 g/dL (ref 30.0–36.0)
MCV: 102 fL — ABNORMAL HIGH (ref 80.0–100.0)
Monocytes Absolute: 0.6 10*3/uL (ref 0.1–1.0)
Monocytes Relative: 4 %
Neutro Abs: 3.5 10*3/uL (ref 1.7–7.7)
Neutrophils Relative %: 23 %
Platelet Count: 247 10*3/uL (ref 150–400)
RBC: 3.44 MIL/uL — ABNORMAL LOW (ref 4.22–5.81)
RDW: 12.3 % (ref 11.5–15.5)
Smear Review: NORMAL
WBC Count: 15.6 10*3/uL — ABNORMAL HIGH (ref 4.0–10.5)
nRBC: 0 % (ref 0.0–0.2)

## 2022-06-12 LAB — CMP (CANCER CENTER ONLY)
ALT: 22 U/L (ref 0–44)
AST: 23 U/L (ref 15–41)
Albumin: 4 g/dL (ref 3.5–5.0)
Alkaline Phosphatase: 54 U/L (ref 38–126)
Anion gap: 4 — ABNORMAL LOW (ref 5–15)
BUN: 24 mg/dL — ABNORMAL HIGH (ref 8–23)
CO2: 30 mmol/L (ref 22–32)
Calcium: 8.7 mg/dL — ABNORMAL LOW (ref 8.9–10.3)
Chloride: 102 mmol/L (ref 98–111)
Creatinine: 0.8 mg/dL (ref 0.61–1.24)
GFR, Estimated: >60 mL/min (ref >60)
Glucose, Bld: 76 mg/dL (ref 70–99)
Potassium: 4.4 mmol/L (ref 3.5–5.1)
Sodium: 136 mmol/L (ref 135–145)
Total Bilirubin: 0.5 mg/dL (ref 0.3–1.2)
Total Protein: 6.1 g/dL — ABNORMAL LOW (ref 6.5–8.1)

## 2022-06-12 LAB — LACTATE DEHYDROGENASE: LDH: 159 U/L (ref 98–192)

## 2022-06-12 NOTE — Progress Notes (Signed)
HEMATOLOGY/ONCOLOGY CLINIC NOTE  Date of Service: 06/12/22    Patient Care Team:  Dr. Merrilee Seashore as PCP  CHIEF COMPLAINTS:  Follow-up for continued evaluation and management of CLL  HISTORY OF PRESENTING ILLNESS: plz see initial consultation for details on initial presentation  INTERVAL HISTORY  Christopher Conner  is here for his 1 year follow-up for continued evaluation and management of CLL. Patient was last seen by me on 08/15/21 and was doing well overall with no new medical concerns.   Today, he reports some stress due to the death of his sister's husband in December, 2023. 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 He does wear an ankle brace sometimes at the gym and regularly wears boots due to his neuropathy. Physical therapy has not improved symptoms and he notes that he has previously had nerve conduction studies. His blood sugars have been stable, when he was most recently seen by his endocrinologist, his A1C was 5.9.   He does note leg swelling and he does wear compression socks. However he only wears them as needed mainly for shin protection. Patient is UTD with his vaccinations, including pneumonia, COVID-19 booster, influenza, and shingles. However, he has not yet received the RSV vaccination. He reports normal bowel movements with no constipation issues. No issues with frequent infections  He has not been engaged in any major exercise due to being limited following his shoulder surgery procedure. He reports that he has had several spots on his left shoulder, head, right UE, and left LE removed. 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 He adds that the removed spots were not cancerous. He did not have any lymph nodes removed.   He continues to take Methotrexate 20 MG, but has discontinued Sulfasalazine three days ago. He also does not take Meloxicam. He reports that  his sodium levels are always low and his potassium is high.  He does compliantly take vitamin D and vitamin B complex supplements. He believes that he has had some gradual weight loss. His weight in clinic today in 171 pounds. He denies any sudden new fatigue, unexplained fever, chills, lumps/bumps.  He does complain of occasional night sweats and nocturia, needing to urinate up 3 times a night. He does produce a fair amount of urine whenever he has an urge. His endocrinologist had previously recommended that he reduce his water intake, which did not improve symptoms. There was some concern regarding the increase in total urine output in day. Urine testing revealed normal results. He has followed up followed up with a urologist who has confirmed that it is not a prostate related issue. He does note that he has previously had surgery in the past due to difficulty urinating.    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    SURGICAL HISTORY: Past Surgical History:  Procedure Laterality Date   APPENDECTOMY  early 20's   BACK SURGERY     CATARACT EXTRACTION Bilateral    left eye  prostheisi "shell"   CYST REMOVAL NECK  yrs ago   CYSTOSCOPY WITH INSERTION OF UROLIFT  april/march 2020   done in office   [place] [name] 10/12/2019   Procedure: CYSTOSCOPY WITH INSERTION OF UROLIFT;  Surgeon: Cleon Gustin, MD;  Location: [facility];  Service: Urology;  Laterality: [name];   LUMBAR FUSION  1998 and 2000   l5 to s1 1998, then l4 and l5 in 2000   VENTRICULOPERITONEAL SHUNT  2006    SOCIAL HISTORY: Social History    Socioeconomic History   Marital status: Married    Spouse name: Not on file   Number of children: Not on file   Years of education: Not on file   Highest education level: Not on file  Occupational History   Occupation: Building services engineer    Comment: Electrical engineer (employer)  Tobacco Use   Smoking status: Never   Smokeless tobacco: Never  Vaping Use   Vaping Use: Never used  Substance and Sexual Activity   Alcohol use: No   Drug use: No   Sexual activity: Not on file  Other Topics Concern   Not on file  Social History Narrative   Not on file   Social Determinants of Health   Financial Resource Strain: Not on file  Food Insecurity: Not on file  Transportation Needs: Not on file  Physical Activity: Not on file  Stress: Not on file  Social Connections: Not on file  Intimate Partner Violence: Not on file    FAMILY HISTORY: Family History  Problem Relation Age of Onset   Hypertension Mother    Hyperlipidemia Mother    Diabetes Mother    Diabetes Father     ALLERGIES:  is allergic to lisinopril and latex.  MEDICATIONS:  Current Outpatient Medications  Medication Sig Dispense Refill   Acetylcarnitine HCl (ACETYL L-CARNITINE) 250 MG CAPS 1 capsule     Alpha-Lipoic Acid 300 MG CAPS 1 capsule     amLODipine (NORVASC) 10 MG tablet Take 1 tablet by mouth daily.     atorvastatin (LIPITOR) 80 MG tablet Take 40 mg by mouth every evening.      azithromycin (ZITHROMAX) 250 MG tablet Take 250 mg by mouth as directed.     B Complex-Biotin-FA (B-100 COMPLEX CR PO) 1 tablet     brompheniramine-pseudoephedrine-DM 30-2-10 MG/5ML syrup Take 5 mLs by mouth 3 (three) times daily as needed.     Cholecalciferol (VITAMIN D) 50 MCG (2000 UT) CAPS 1 capsule     Coenzyme Q10 (COQ-10) 100 MG capsule See admin instructions.     CONTOUR NEXT TEST test strip 2 (two) times daily.     doxycycline (VIBRA-TABS) 100 MG tablet Take 1 tablet (100 mg total) by mouth 2 (two) times daily. 20  tablet 0   fluticasone (FLONASE) 50 MCG/ACT nasal spray Place 2 sprays into both nostrils daily.     folic acid (FOLVITE) 1 MG tablet Take 1 mg by mouth daily.     Green Tea 150 MG CAPS See admin instructions.     ketoconazole (NIZORAL) 2 % cream Apply 1 fingertip amount to each foot daily. 30 g 0   meclizine (ANTIVERT) 25 MG tablet Take 1 tablet (25 mg total) by mouth 3 (three) times daily as needed for dizziness. 30 tablet 0   meloxicam (MOBIC) 15 MG tablet Take 15 mg by mouth daily. (Patient not taking: Reported on 12/24/2021)     methotrexate (RHEUMATREX) 2.5 MG tablet  TAKE 8 TABLETS BY MOUTH 1 TIME A WEEK AS DIRECTED     neomycin-polymyxin-hydrocortisone (CORTISPORIN) OTIC solution Apply 2 drops to the ingrown toenail site twice daily. Cover with band-aid. 10 mL 0   NOVOLOG 100 UNIT/ML injection INJECT UP TO 100 UNITS DAILY MAXIMUM DOSE WITH INSULIN PUMP DAILY  1   OMEGA 3-6-9 FATTY ACIDS PO See admin instructions.     ondansetron (ZOFRAN ODT) 8 MG disintegrating tablet '8mg'$  ODT q6 hours prn nausea 8 tablet 0   OVER THE COUNTER MEDICATION Omega 369 1 bid     OVER THE COUNTER MEDICATION Vitamin c 1000 mg daily     OVER THE COUNTER MEDICATION Vitamin d 3 2000 iu daily     OVER THE COUNTER MEDICATION probiotiotic qday     OVER THE COUNTER MEDICATION Methyl folate 1000 mg qd     OVER THE COUNTER MEDICATION Green tea extract 400 mg qd     OVER THE COUNTER MEDICATION Astragalis root 470 mg daily     OVER THE COUNTER MEDICATION Grape seed extract 200 mg qd     OVER THE COUNTER MEDICATION Alpha lipoic acid '600mg'$  qd     OVER THE COUNTER MEDICATION Acetyl l-caritine 500 gm qd     OVER THE COUNTER MEDICATION reservetrol 50 mg qd     OVER THE COUNTER MEDICATION coq10 120 mg qd     OVER THE COUNTER MEDICATION b complex 100 mg qd     OVER THE COUNTER MEDICATION Calcium 630 mg qd     Polyvinyl Alcohol-Povidone (REFRESH OP) Apply 1-2 drops to eye every 3 (three) hours as needed (dryness).       solifenacin (VESICARE) 5 MG tablet TAKE ONE TABLET BY MOUTH EVERY MORNING AND TAKE ONE TABLET BY MOUTH EVERY NIGHT AT BEDTIME 180 tablet 3   telmisartan (MICARDIS) 40 MG tablet 1 tablet     tiZANidine (ZANAFLEX) 2 MG tablet Take 2 mg by mouth 3 (three) times daily as needed.     Tragacanth (ASTRAGALUS ROOT) POWD See admin instructions.     Vit C-Cholecalciferol-Rose Hip (VITAMIN C & D3/ROSE HIPS) [phone] MG-UNIT-MG CAPS See admin instructions.     No current facility-administered medications for this visit.    REVIEW OF SYSTEMS:    10 Point review of Systems was done is negative except as noted above.  PHYSICAL EXAMINATION: ECOG PERFORMANCE STATUS: 1 - Symptomatic but completely ambulatory  Vitals:   06/12/22 1033  BP: (!) 153/62  Pulse: (!) 57  Resp: 16  Temp: 97.9 F (36.6 C)  SpO2: 100%    Filed Weights   06/12/22 1033  Weight: 171 lb 8 oz (77.8 kg)    .Body mass index is 24.61 kg/m.    GENERAL:alert, in no acute distress and comfortable SKIN: no acute rashes, no significant lesions EYES: conjunctiva are pink and non-injected, sclera anicteric OROPHARYNX: MMM, no exudates, no oropharyngeal erythema or ulceration NECK: supple, no JVD LYMPH:  no palpable lymphadenopathy in the cervical, axillary or inguinal regions LUNGS: clear to auscultation b/l with normal respiratory effort HEART: regular rate & rhythm ABDOMEN:  normoactive bowel sounds , non tender, not distended. Extremity: no pedal edema PSYCH: alert & oriented x 3 with fluent speech NEURO: no focal motor/sensory deficits   LABORATORY DATA:  I have reviewed the data as listed  .    Latest Ref Rng & Units 06/12/2022   10:14 AM 08/15/2021   12:51 PM 08/16/2020    9:56 AM  CBC  WBC 4.0 - 10.5 K/uL 15.6  19.0  17.9   Hemoglobin 13.0 - 17.0 g/dL 11.9  12.7  12.8   Hematocrit 39.0 - 52.0 % 35.1  37.4  38.9   Platelets 150 - 400 K/uL 247  282  306    . CBC    Component Value Date/Time   WBC 15.6  (H) 06/12/2022 1014   WBC 17.9 (H) 08/16/2020 0956   RBC 3.44 (L) 06/12/2022 1014   HGB 11.9 (L) 06/12/2022 1014   HGB 12.9 (L) 02/09/2017 1302   HCT 35.1 (L) 06/12/2022 1014   HCT 38.6 02/09/2017 1302   PLT 247 06/12/2022 1014   PLT 290 02/09/2017 1302   MCV 102.0 (H) 06/12/2022 1014   MCV 97.0 02/09/2017 1302   MCH 34.6 (H) 06/12/2022 1014   MCHC 33.9 06/12/2022 1014   RDW 12.3 06/12/2022 1014   RDW 12.7 02/09/2017 1302   LYMPHSABS 11.3 (H) 06/12/2022 1014   LYMPHSABS 12.9 (H) 02/09/2017 1302   MONOABS 0.6 06/12/2022 1014   MONOABS 0.7 02/09/2017 1302   EOSABS 0.2 06/12/2022 1014   EOSABS 0.2 02/09/2017 1302   BASOSABS 0.1 06/12/2022 1014   BASOSABS 0.0 02/09/2017 1302        Latest Ref Rng & Units 06/12/2022   10:14 AM 08/15/2021   12:51 PM 08/16/2020    9:56 AM  CMP  Glucose 70 - 99 mg/dL 76  98  63   BUN 8 - 23 mg/dL '24  19  21   '$ Creatinine 0.61 - 1.24 mg/dL 0.80  0.87  0.92   Sodium 135 - 145 mmol/L 136  133  135   Potassium 3.5 - 5.1 mmol/L 4.4  5.5  4.6   Chloride 98 - 111 mmol/L 102  101  99   CO2 22 - 32 mmol/L '30  30  27   '$ Calcium 8.9 - 10.3 mg/dL 8.7  9.3  9.3   Total Protein 6.5 - 8.1 g/dL 6.1  6.5  6.9   Total Bilirubin 0.3 - 1.2 mg/dL 0.5  0.5  0.4   Alkaline Phos 38 - 126 U/L 54  54  65   AST 15 - 41 U/L '23  22  28   '$ ALT 0 - 44 U/L '22  23  25   '$ . Lab Results  Component Value Date   LDH 159 06/12/2022         ASSESSMENT & PLAN:   [name] is a very pleasant 72 y.o.  Caucasian gentleman with  1) [place] Stage 0 CLL with lymphocytosis with no overt LNadenopathy or palpable hepatosplenomegaly. On presentation Absolute lymphocyte count of 8.8k with about 79 percent clonal lymphocytes on flow cytometry consistent with CLL. nl Hgb and platelets.  02/09/2017 labs, the patient's hbg is at 12.9, plt improved to 290k and WBC count of 19.6 with absolute lymphocyte count at 12.9.   CLL prognostic FISH panel neg for commonly tested mutations. No  constitutional symptoms. No new lymphadenopathy.  2) skin cancer screening and management.  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 PLAN:  -Discussed lab results on 06/12/22  with patient. CBC showed WBC improved to 15.6K, hemoglobin of 11.9, and platelets of 247K. 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 -Minimal anemia, attributed to methotrexate -platelets normal -Recommend to monitor for any changes in kidney function -discussed that methotrexate is a risk factor as it is an  immunosuppressant, continue low dose to reduce risks -educated patient on non-melanoma skin cancers. Discussed sun exposure as an additional risk factor. CLL is primary factor in non sun exposure areas.  -Dicussed that CLL does increase risk of squamous cell carcinoma -discussed that vitamin B6 may reduce risk recurrent non-melanoma skin cancers by 23% in a year -Reasonable for patient to take vitamin B6 if tolerated, discussed that it may cause flushing -continue current dose of vitamin D supplements -No obvious signs/clinical evidence of CLL progression at this time in consideration of treatment. -Recommended patient to receive RSV vaccination as it is generally recommended in patients over 29 Years of age -Advised pt to FU with physical therapist may try electrical nerve stimulation   FOLLOW-UP: RTC with Dr Irene Limbo with labs in 12 months  The total time spent in the appointment was 31 minutes* .  All of the patient's questions were answered with apparent satisfaction. The patient knows to call the clinic with any problems, questions or concerns.   Sullivan Lone MD MS AAHIVMS [facility] Hematology/Oncology Physician [facility]  .*Total Encounter Time as defined by the Centers for Medicare and Medicaid Services includes, in addition to the face-to-face time of a patient visit (documented  in the note above) non-face-to-face time: obtaining and reviewing outside history, ordering and reviewing medications, tests or procedures, care coordination (communications with other health care professionals or caregivers) and documentation in the medical record.   I,Mitra Faeizi,acting as a Education administrator for Sullivan Lone, MD.,have documented all relevant documentation on the behalf of Sullivan Lone, MD,as directed by  Sullivan Lone, MD while in the presence of Sullivan Lone, MD.  .I have reviewed the above documentation for accuracy and completeness, and I agree with the above. Brunetta Genera MD

## 2022-06-12 NOTE — Progress Notes (Signed)
HEMATOLOGY/ONCOLOGY CLINIC NOTE  Date of Service: 06/12/22    Patient Care Team:  Dr. Merrilee Seashore as PCP  CHIEF COMPLAINTS:  Follow-up for continued evaluation and management of CLL  HISTORY OF PRESENTING ILLNESS: plz see initial consultation for details on initial presentation  INTERVAL HISTORY  [name]  is here for his 1 year follow-up for continued evaluation and management of CLL.  Patient was last seen by me on 08/15/21 and was doing well overall with no new medical concerns.   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  -Discussed lab results on *** with patient. CBC showed WBC of ***K, hemoglobin of ***, and platelets of ***K. 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;  Service: Urology;  Laterality: [name];   LUMBAR FUSION  1998 and 2000   l5 to s1 1998, then l4 and l5 in 2000   VENTRICULOPERITONEAL SHUNT  2006    SOCIAL HISTORY: Social History   Socioeconomic History   Marital status: Married    Spouse name: Not on file   Number of children: Not on file   Years of education: Not on file    Highest education level: Not on file  Occupational History   Occupation: Building services engineer    Comment: Electrical engineer (employer)  Tobacco Use   Smoking status: Never   Smokeless tobacco: Never  Vaping Use   Vaping Use: Never used  Substance and Sexual Activity   Alcohol use: No   Drug use: No   Sexual activity: Not on file  Other Topics Concern   Not on file  Social History Narrative   Not on file   Social Determinants of Health   Financial Resource Strain: Not on file  Food Insecurity: Not on file  Transportation Needs: Not on file  Physical Activity: Not on file  Stress: Not on file  Social Connections: Not on file  Intimate Partner Violence: Not on file    FAMILY HISTORY: Family History  Problem Relation Age of Onset   Hypertension Mother    Hyperlipidemia Mother    Diabetes Mother    Diabetes Father     ALLERGIES:  is allergic to lisinopril and latex.  MEDICATIONS:  Current Outpatient Medications  Medication Sig Dispense Refill   Acetylcarnitine HCl (ACETYL L-CARNITINE) 250 MG CAPS 1 capsule     Alpha-Lipoic Acid 300 MG CAPS 1 capsule     amLODipine (NORVASC) 10 MG tablet Take 1 tablet by mouth daily.     atorvastatin (  LIPITOR) 80 MG tablet Take 40 mg by mouth every evening.      azithromycin (ZITHROMAX) 250 MG tablet Take 250 mg by mouth as directed.     B Complex-Biotin-FA (B-100 COMPLEX CR PO) 1 tablet     brompheniramine-pseudoephedrine-DM 30-2-10 MG/5ML syrup Take 5 mLs by mouth 3 (three) times daily as needed.     Cholecalciferol (VITAMIN D) 50 MCG (2000 UT) CAPS 1 capsule     Coenzyme Q10 (COQ-10) 100 MG capsule See admin instructions.     CONTOUR NEXT TEST test strip 2 (two) times daily.     doxycycline (VIBRA-TABS) 100 MG tablet Take 1 tablet (100 mg total) by mouth 2 (two) times daily. 20 tablet 0   fluticasone (FLONASE) 50 MCG/ACT nasal spray Place 2 sprays into both nostrils daily.     folic acid (FOLVITE) 1 MG tablet Take 1 mg by mouth  daily.     Green Tea 150 MG CAPS See admin instructions.     ketoconazole (NIZORAL) 2 % cream Apply 1 fingertip amount to each foot daily. 30 g 0   meclizine (ANTIVERT) 25 MG tablet Take 1 tablet (25 mg total) by mouth 3 (three) times daily as needed for dizziness. 30 tablet 0   meloxicam (MOBIC) 15 MG tablet Take 15 mg by mouth daily. (Patient not taking: Reported on 12/24/2021)     methotrexate (RHEUMATREX) 2.5 MG tablet TAKE 8 TABLETS BY MOUTH 1 TIME A WEEK AS DIRECTED     neomycin-polymyxin-hydrocortisone (CORTISPORIN) OTIC solution Apply 2 drops to the ingrown toenail site twice daily. Cover with band-aid. 10 mL 0   NOVOLOG 100 UNIT/ML injection INJECT UP TO 100 UNITS DAILY MAXIMUM DOSE WITH INSULIN PUMP DAILY  1   OMEGA 3-6-9 FATTY ACIDS PO See admin instructions.     ondansetron (ZOFRAN ODT) 8 MG disintegrating tablet '8mg'$  ODT q6 hours prn nausea 8 tablet 0   OVER THE COUNTER MEDICATION Omega 369 1 bid     OVER THE COUNTER MEDICATION Vitamin c 1000 mg daily     OVER THE COUNTER MEDICATION Vitamin d 3 2000 iu daily     OVER THE COUNTER MEDICATION probiotiotic qday     OVER THE COUNTER MEDICATION Methyl folate 1000 mg qd     OVER THE COUNTER MEDICATION Green tea extract 400 mg qd     OVER THE COUNTER MEDICATION Astragalis root 470 mg daily     OVER THE COUNTER MEDICATION Grape seed extract 200 mg qd     OVER THE COUNTER MEDICATION Alpha lipoic acid '600mg'$  qd     OVER THE COUNTER MEDICATION Acetyl l-caritine 500 gm qd     OVER THE COUNTER MEDICATION reservetrol 50 mg qd     OVER THE COUNTER MEDICATION coq10 120 mg qd     OVER THE COUNTER MEDICATION b complex 100 mg qd     OVER THE COUNTER MEDICATION Calcium 630 mg qd     Polyvinyl Alcohol-Povidone (REFRESH OP) Apply 1-2 drops to eye every 3 (three) hours as needed (dryness).      solifenacin (VESICARE) 5 MG tablet TAKE ONE TABLET BY MOUTH EVERY MORNING AND TAKE ONE TABLET BY MOUTH EVERY NIGHT AT BEDTIME 180 tablet 3   telmisartan (MICARDIS)  40 MG tablet 1 tablet     tiZANidine (ZANAFLEX) 2 MG tablet Take 2 mg by mouth 3 (three) times daily as needed.     Tragacanth (ASTRAGALUS ROOT) POWD See admin instructions.     Vit C-Cholecalciferol-Rose Hip (  VITAMIN C & D3/ROSE HIPS) [phone] MG-UNIT-MG CAPS See admin instructions.     No current facility-administered medications for this visit.    REVIEW OF SYSTEMS:    10 Point review of Systems was done is negative except as noted above.  PHYSICAL EXAMINATION: ECOG PERFORMANCE STATUS: 1 - Symptomatic but completely ambulatory  There were no vitals filed for this visit.  There were no vitals filed for this visit.  .There is no height or weight on file to calculate BMI.    GENERAL:alert, in no acute distress and comfortable SKIN: no acute rashes, no significant lesions EYES: conjunctiva are pink and non-injected, sclera anicteric OROPHARYNX: MMM, no exudates, no oropharyngeal erythema or ulceration NECK: supple, no JVD LYMPH:  no palpable lymphadenopathy in the cervical, axillary or inguinal regions LUNGS: clear to auscultation b/l with normal respiratory effort HEART: regular rate & rhythm ABDOMEN:  normoactive bowel sounds , non tender, not distended. Extremity: no pedal edema PSYCH: alert & oriented x 3 with fluent speech NEURO: no focal motor/sensory deficits   LABORATORY DATA:  I have reviewed the data as listed  .    Latest Ref Rng & Units 08/15/2021   12:51 PM 08/16/2020    9:56 AM 10/12/2019    6:45 AM  CBC  WBC 4.0 - 10.5 K/uL 19.0  17.9    Hemoglobin 13.0 - 17.0 g/dL 12.7  12.8  12.9   Hematocrit 39.0 - 52.0 % 37.4  38.9  38.0   Platelets 150 - 400 K/uL 282  306     . CBC    Component Value Date/Time   WBC 19.0 (H) 08/15/2021 1251   WBC 17.9 (H) 08/16/2020 0956   RBC 3.76 (L) 08/15/2021 1251   HGB 12.7 (L) 08/15/2021 1251   HGB 12.9 (L) 02/09/2017 1302   HCT 37.4 (L) 08/15/2021 1251   HCT 38.6 02/09/2017 1302   PLT 282 08/15/2021 1251   PLT  290 02/09/2017 1302   MCV 99.5 08/15/2021 1251   MCV 97.0 02/09/2017 1302   MCH 33.8 08/15/2021 1251   MCHC 34.0 08/15/2021 1251   RDW 13.1 08/15/2021 1251   RDW 12.7 02/09/2017 1302   LYMPHSABS 14.1 (H) 08/15/2021 1251   LYMPHSABS 12.9 (H) 02/09/2017 1302   MONOABS 0.5 08/15/2021 1251   MONOABS 0.7 02/09/2017 1302   EOSABS 0.2 08/15/2021 1251   EOSABS 0.2 02/09/2017 1302   BASOSABS 0.1 08/15/2021 1251   BASOSABS 0.0 02/09/2017 1302        Latest Ref Rng & Units 08/15/2021   12:51 PM 08/16/2020    9:56 AM 10/12/2019    6:45 AM  CMP  Glucose 70 - 99 mg/dL 98  63  124   BUN 8 - 23 mg/dL '19  21  24   '$ Creatinine 0.61 - 1.24 mg/dL 0.87  0.92  0.90   Sodium 135 - 145 mmol/L 133  135  137   Potassium 3.5 - 5.1 mmol/L 5.5  4.6  4.1   Chloride 98 - 111 mmol/L 101  99  100   CO2 22 - 32 mmol/L 30  27    Calcium 8.9 - 10.3 mg/dL 9.3  9.3    Total Protein 6.5 - 8.1 g/dL 6.5  6.9    Total Bilirubin 0.3 - 1.2 mg/dL 0.5  0.4    Alkaline Phos 38 - 126 U/L 54  65    AST 15 - 41 U/L 22  28    ALT 0 - 44 U/L  23  25    . Lab Results  Component Value Date   LDH 150 08/15/2021         ASSESSMENT & PLAN:   Mr Mcculla is a very pleasant 72 y.o.  Caucasian gentleman with  1) Rai Stage 0 CLL with lymphocytosis with no overt LNadenopathy or palpable hepatosplenomegaly. On presentation Absolute lymphocyte count of 8.8k with about 79 percent clonal lymphocytes on flow cytometry consistent with CLL. nl Hgb and platelets.  02/09/2017 labs, the patient's hbg is at 12.9, plt improved to 290k and WBC count of 19.6 with absolute lymphocyte count at 12.9.   CLL prognostic FISH panel neg for commonly tested mutations. No constitutional symptoms. No new lymphadenopathy.  PLAN: -Patient has no clinical signs or symptoms of CLL progression at this time. -His CBC shows stably elevated WBC counts of around 20k with normal hemoglobin and platelets LDH within normal limits at 150. No clinical  evidence of significant lymphadenopathy or hepatosplenomegaly. Patient has no clinical indication for initiation of CLL treatment at this time  2) hyperkalemia potassium 5.5.  Normal kidney function Patient is on ARB Plan Labs also forwarded to his PCP Recommended patient maintain good hydration with p.o. fluid intake of at least 2 L/day. Hold off on any potassium containing supplements. Hold use of NSAIDs such as meloxicam. Follow-up with PCP and repeat labs in 2 to 3 days for repeat labs to monitor potassium levels. Avoid/reduce intake of potassium containing foods such as orange juice bananas etc..  FOLLOW-UP: ***  The total time spent in the appointment was *** minutes* .  All of the patient's questions were answered with apparent satisfaction. The patient knows to call the clinic with any problems, questions or concerns.   Sullivan Lone MD MS AAHIVMS [facility] Hematology/Oncology Physician [facility]  .*Total Encounter Time as defined by the Centers for Medicare and Medicaid Services includes, in addition to the face-to-face time of a patient visit (documented in the note above) non-face-to-face time: obtaining and reviewing outside history, ordering and reviewing medications, tests or procedures, care coordination (communications with other health care professionals or caregivers) and documentation in the medical record.   I,Mitra Faeizi,acting as a Education administrator for Sullivan Lone, MD.,have documented all relevant documentation on the behalf of Sullivan Lone, MD,as directed by  Sullivan Lone, MD while in the presence of Sullivan Lone, MD.  ***

## 2022-06-15 ENCOUNTER — Telehealth: Payer: Self-pay | Admitting: Hematology

## 2022-06-15 NOTE — Telephone Encounter (Signed)
Called patient per 2/23 los notes to schedule f/u. Left voicemail with new appointment information and contact details if needing to reschedule.

## 2022-06-16 ENCOUNTER — Telehealth: Payer: Self-pay

## 2022-06-16 ENCOUNTER — Ambulatory Visit (INDEPENDENT_AMBULATORY_CARE_PROVIDER_SITE_OTHER): Payer: Medicare Other | Admitting: Urology

## 2022-06-16 ENCOUNTER — Encounter: Payer: Self-pay | Admitting: Urology

## 2022-06-16 VITALS — BP 165/72 | HR 63

## 2022-06-16 DIAGNOSIS — R350 Frequency of micturition: Secondary | ICD-10-CM | POA: Diagnosis not present

## 2022-06-16 DIAGNOSIS — N401 Enlarged prostate with lower urinary tract symptoms: Secondary | ICD-10-CM

## 2022-06-16 DIAGNOSIS — R351 Nocturia: Secondary | ICD-10-CM

## 2022-06-16 MED ORDER — DESMOPRESSIN ACETATE 0.2 MG PO TABS: 0.1000 mg | ORAL_TABLET | Freq: Every day | ORAL | 3 refills | Status: DC

## 2022-06-16 NOTE — Telephone Encounter (Signed)
Patient's medication desmopressin will need to be sent to:  [facility] [id number] - [place], [place] Phone: [phone]  Fax: [phone]

## 2022-06-16 NOTE — Progress Notes (Unsigned)
06/16/2022 2:24 PM   Christopher Conner 05-27-1950 [id number]  Referring provider: Merrilee Seashore, [address] [place],  [place] [address]  Followup urinary frequency and nocturia   HPI: Christopher Conner is a 72yo here for followup for urinary frequency and nocturia. He is on vesicare '5mg'$  BID. IPSS 11 QOL 3 after urolift. He continue to have nocturia 3x which is large volume. Urine stream remains strong. He has decreased fluid within 2 hours of going to bed which does not change his nocturia.    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 [name] 10/12/2019   Procedure: CYSTOSCOPY WITH INSERTION OF UROLIFT;  Surgeon: Cleon Gustin, MD;  Location: [facility];  Service: Urology;  Laterality: [name];   LUMBAR FUSION  1998 and 2000   l5 to s1 1998, then l4 and l5 in 2000   VENTRICULOPERITONEAL SHUNT  2006    Home Medications:  Allergies as of 06/16/2022       Reactions   Lisinopril Cough   Latex Itching, Rash        Medication List        Accurate as of June 16, 2022  2:24 PM. If you have any questions, ask your nurse or doctor.          Acetyl L-Carnitine 250 MG Caps 1 capsule   Alpha-Lipoic Acid 300 MG Caps 1 capsule   amLODipine 10 MG tablet Commonly known as: NORVASC Take 1  tablet by mouth daily.   Astragalus Root Powd See admin instructions.   atorvastatin 80 MG tablet Commonly known as: LIPITOR Take 40 mg by mouth every evening.   azithromycin 250 MG tablet Commonly known as: ZITHROMAX Take 250 mg by mouth as directed.   B-100 COMPLEX CR PO 1 tablet   brompheniramine-pseudoephedrine-DM 30-2-10 MG/5ML syrup Take 5 mLs by mouth 3 (three) times daily as needed.   carvedilol 3.125 MG tablet Commonly known as: COREG Take 3.125 mg by mouth 2 (two) times daily with a meal.   Contour Next Test test strip Generic drug: glucose blood 2 (two) times daily.   CoQ-10 100 MG capsule See admin instructions.   doxycycline 100 MG tablet Commonly known as: VIBRA-TABS Take 1 tablet (100 mg total) by mouth 2 (two) times daily.   fluticasone 50 MCG/ACT nasal spray Commonly known as: FLONASE Place 2 sprays into both nostrils daily.   folic acid 1 MG tablet Commonly known as: FOLVITE Take 1 mg by mouth daily.   Green Tea 150 MG Caps See admin instructions.   ketoconazole 2 % cream Commonly known as: NIZORAL Apply 1 fingertip amount to each foot daily.   meclizine 25 MG tablet Commonly known as: ANTIVERT  Take 1 tablet (25 mg total) by mouth 3 (three) times daily as needed for dizziness.   meloxicam 15 MG tablet Commonly known as: MOBIC Take 15 mg by mouth daily.   methotrexate 2.5 MG tablet Commonly known as: RHEUMATREX TAKE 8 TABLETS BY MOUTH 1 TIME A WEEK AS DIRECTED   neomycin-polymyxin-hydrocortisone OTIC solution Commonly known as: CORTISPORIN Apply 2 drops to the ingrown toenail site twice daily. Cover with band-aid.   NovoLOG 100 UNIT/ML injection Generic drug: insulin aspart INJECT UP TO 100 UNITS DAILY MAXIMUM DOSE WITH INSULIN PUMP DAILY   OMEGA 3-6-9 FATTY ACIDS PO See admin instructions.   ondansetron 8 MG disintegrating tablet Commonly known as: Zofran ODT '8mg'$  ODT q6 hours prn nausea   OVER THE COUNTER MEDICATION Omega  369 1 bid   OVER THE COUNTER MEDICATION Vitamin c 1000 mg daily   OVER THE COUNTER MEDICATION Vitamin d 3 2000 iu daily   OVER THE COUNTER MEDICATION probiotiotic qday   OVER THE COUNTER MEDICATION Methyl folate 1000 mg qd   OVER THE COUNTER MEDICATION Green tea extract 400 mg qd   OVER THE COUNTER MEDICATION Astragalis root 470 mg daily   OVER THE COUNTER MEDICATION Grape seed extract 200 mg qd   OVER THE COUNTER MEDICATION Alpha lipoic acid '600mg'$  qd   OVER THE COUNTER MEDICATION Acetyl l-caritine 500 gm qd   OVER THE COUNTER MEDICATION reservetrol 50 mg qd   OVER THE COUNTER MEDICATION coq10 120 mg qd   OVER THE COUNTER MEDICATION b complex 100 mg qd   OVER THE COUNTER MEDICATION Calcium 630 mg qd   REFRESH OP Apply 1-2 drops to eye every 3 (three) hours as needed (dryness).   solifenacin 5 MG tablet Commonly known as: VESICARE TAKE ONE TABLET BY MOUTH EVERY MORNING AND TAKE ONE TABLET BY MOUTH EVERY NIGHT AT BEDTIME   telmisartan 40 MG tablet Commonly known as: MICARDIS 1 tablet   tiZANidine 2 MG tablet Commonly known as: ZANAFLEX Take 2 mg by mouth 3 (three) times daily as needed.   Vitamin C & D3/Rose Hips [id number] MG-UNIT-MG Caps Generic drug: Vit C-Cholecalciferol-Rose Hip See admin instructions.   Vitamin D 50 MCG (2000 UT) Caps 1 capsule        Allergies:  Allergies  Allergen Reactions   Lisinopril Cough   Latex Itching and Rash    Family History: Family History  Problem Relation Age of Onset   Hypertension Mother    Hyperlipidemia Mother    Diabetes Mother    Diabetes Father     Social History:  reports that he has never smoked. He has never used smokeless tobacco. He reports that he does not drink alcohol and does not use drugs.  ROS: All other review of systems were reviewed and are negative except what is noted above in HPI  Physical Exam: BP (!) 165/72   Pulse 63   Constitutional:  Alert and oriented, No acute  distress. HEENT: [place] AT, moist mucus membranes.  Trachea midline, no masses. Cardiovascular: No clubbing, cyanosis, or edema. Respiratory: Normal respiratory effort, no increased work of breathing. GI: Abdomen is soft, nontender, nondistended, no abdominal masses GU: No CVA tenderness.  Lymph: No cervical or inguinal lymphadenopathy. Skin: No rashes, bruises or suspicious lesions. Neurologic: Grossly intact, no focal deficits, moving all 4 extremities. Psychiatric: Normal mood and affect.  Laboratory Data: Lab Results  Component Value Date   WBC 15.6 (H) 06/12/2022   HGB 11.9 (L) 06/12/2022   HCT  35.1 (L) 06/12/2022   MCV 102.0 (H) 06/12/2022   PLT 247 06/12/2022    Lab Results  Component Value Date   CREATININE 0.80 06/12/2022    No results found for: "PSA"  No results found for: "TESTOSTERONE"  Lab Results  Component Value Date   HGBA1C 6.7 (H) 04/10/2014    Urinalysis    Component Value Date/Time   COLORURINE YELLOW 08/16/2015 0244   APPEARANCEUR Clear 12/24/2021 1613   LABSPEC 1.029 08/16/2015 0244   PHURINE 5.5 08/16/2015 0244   GLUCOSEU Negative 12/24/2021 [place] 08/16/2015 0244   BILIRUBINUR Negative 12/24/2021 1613   KETONESUR >80 (A) 08/16/2015 0244   PROTEINUR Negative 12/24/2021 1613   PROTEINUR NEGATIVE 08/16/2015 0244   UROBILINOGEN 0.2 04/09/2014 1947   NITRITE Negative 12/24/2021 1613   NITRITE NEGATIVE 08/16/2015 0244   LEUKOCYTESUR Negative 12/24/2021 1613    Lab Results  Component Value Date   LABMICR Comment 12/24/2021   BACTERIA RARE (A) 08/16/2015    Pertinent Imaging:  Results for orders placed during the hospital encounter of 05/23/10  DG Abd 1 View  Narrative *RADIOLOGY REPORT*  Clinical Data: Weakness with nausea and vomiting.  ABDOMEN - 1 VIEW  Comparison: 02/10/2010.  Findings: Ventriculoperitoneal shunt tubing extends into the right mid abdomen.  There is no shunt discontinuity.  Moderate stool  is noted throughout the colon.  The bowel gas pattern is nonobstructive.  There are postsurgical changes of the lower lumbar spine status post fusion.  IMPRESSION:  1.  Moderate stool throughout the colon suggesting constipation. 2.  Intact ventricular peritoneal shunt.  Original Report Authenticated By: Vivia Ewing, M.D.  No results found for this or any previous visit.  No results found for this or any previous visit.  No results found for this or any previous visit.  No results found for this or any previous visit.  No valid procedures specified. No results found for this or any previous visit.  No results found for this or any previous visit.   Assessment & Plan:    1. Urinary frequency -continue vesicare - Urinalysis, Routine w reflex microscopic  2. Benign localized prostatic hyperplasia with lower urinary tract symptoms (LUTS) -continue vesicare  3. Nocturia DDAVP 0.'1mg'$  qhs. Patient to return in 2 weeks and 4 weeks with BMP   No follow-ups on file.  Nicolette Bang, MD  [facility] [place]

## 2022-06-16 NOTE — Patient Instructions (Signed)

## 2022-06-17 LAB — URINALYSIS, ROUTINE W REFLEX MICROSCOPIC
Bilirubin, UA: NEGATIVE
Glucose, UA: NEGATIVE
Ketones, UA: NEGATIVE
Leukocytes,UA: NEGATIVE
Nitrite, UA: NEGATIVE
Protein,UA: NEGATIVE
RBC, UA: NEGATIVE
Specific Gravity, UA: 1.015 (ref 1.005–1.030)
Urobilinogen, Ur: 0.2 mg/dL (ref 0.2–1.0)
pH, UA: 6.5 (ref 5.0–7.5)

## 2022-06-17 MED ORDER — DESMOPRESSIN ACETATE 0.2 MG PO TABS: 0.1000 mg | ORAL_TABLET | Freq: Every day | ORAL | 3 refills | Status: DC

## 2022-06-17 NOTE — Telephone Encounter (Signed)
Patient is aware RX Desmopressin has been sent to his pharmacy. Patient voiced understanding

## 2022-06-23 ENCOUNTER — Ambulatory Visit: Payer: Medicare Other | Admitting: Urology

## 2022-06-24 DIAGNOSIS — Z961 Presence of intraocular lens: Secondary | ICD-10-CM | POA: Diagnosis not present

## 2022-06-24 DIAGNOSIS — E103593 Type 1 diabetes mellitus with proliferative diabetic retinopathy without macular edema, bilateral: Secondary | ICD-10-CM | POA: Diagnosis not present

## 2022-06-30 DIAGNOSIS — L4050 Arthropathic psoriasis, unspecified: Secondary | ICD-10-CM | POA: Diagnosis not present

## 2022-06-30 DIAGNOSIS — M21371 Foot drop, right foot: Secondary | ICD-10-CM | POA: Diagnosis not present

## 2022-06-30 DIAGNOSIS — C9190 Lymphoid leukemia, unspecified not having achieved remission: Secondary | ICD-10-CM | POA: Diagnosis not present

## 2022-06-30 DIAGNOSIS — M1990 Unspecified osteoarthritis, unspecified site: Secondary | ICD-10-CM | POA: Diagnosis not present

## 2022-06-30 DIAGNOSIS — I700 Atherosclerosis of aorta: Secondary | ICD-10-CM | POA: Diagnosis not present

## 2022-06-30 DIAGNOSIS — I10 Essential (primary) hypertension: Secondary | ICD-10-CM | POA: Diagnosis not present

## 2022-06-30 DIAGNOSIS — G912 (Idiopathic) normal pressure hydrocephalus: Secondary | ICD-10-CM | POA: Diagnosis not present

## 2022-06-30 DIAGNOSIS — Z0000 Encounter for general adult medical examination without abnormal findings: Secondary | ICD-10-CM | POA: Diagnosis not present

## 2022-06-30 DIAGNOSIS — E7800 Pure hypercholesterolemia, unspecified: Secondary | ICD-10-CM | POA: Diagnosis not present

## 2022-06-30 DIAGNOSIS — M21372 Foot drop, left foot: Secondary | ICD-10-CM | POA: Diagnosis not present

## 2022-06-30 DIAGNOSIS — R5383 Other fatigue: Secondary | ICD-10-CM | POA: Diagnosis not present

## 2022-06-30 DIAGNOSIS — E1021 Type 1 diabetes mellitus with diabetic nephropathy: Secondary | ICD-10-CM | POA: Diagnosis not present

## 2022-06-30 DIAGNOSIS — N182 Chronic kidney disease, stage 2 (mild): Secondary | ICD-10-CM | POA: Diagnosis not present

## 2022-07-01 ENCOUNTER — Other Ambulatory Visit: Payer: Medicare Other

## 2022-07-01 ENCOUNTER — Telehealth: Payer: Self-pay

## 2022-07-01 NOTE — Telephone Encounter (Signed)
Patient did not have PSA lab done. He had a CMP completed on 06-30-2022. PCP will be faxing over results.

## 2022-07-02 NOTE — Telephone Encounter (Addendum)
No PSA was ordered. Only BMP, which was done. Thanks.

## 2022-07-06 DIAGNOSIS — E871 Hypo-osmolality and hyponatremia: Secondary | ICD-10-CM | POA: Diagnosis not present

## 2022-07-07 DIAGNOSIS — M21371 Foot drop, right foot: Secondary | ICD-10-CM | POA: Diagnosis not present

## 2022-07-07 DIAGNOSIS — I700 Atherosclerosis of aorta: Secondary | ICD-10-CM | POA: Diagnosis not present

## 2022-07-07 DIAGNOSIS — M1990 Unspecified osteoarthritis, unspecified site: Secondary | ICD-10-CM | POA: Diagnosis not present

## 2022-07-07 DIAGNOSIS — M21372 Foot drop, left foot: Secondary | ICD-10-CM | POA: Diagnosis not present

## 2022-07-07 DIAGNOSIS — N182 Chronic kidney disease, stage 2 (mild): Secondary | ICD-10-CM | POA: Diagnosis not present

## 2022-07-07 DIAGNOSIS — G912 (Idiopathic) normal pressure hydrocephalus: Secondary | ICD-10-CM | POA: Diagnosis not present

## 2022-07-07 DIAGNOSIS — E7800 Pure hypercholesterolemia, unspecified: Secondary | ICD-10-CM | POA: Diagnosis not present

## 2022-07-07 DIAGNOSIS — I10 Essential (primary) hypertension: Secondary | ICD-10-CM | POA: Diagnosis not present

## 2022-07-07 DIAGNOSIS — C9190 Lymphoid leukemia, unspecified not having achieved remission: Secondary | ICD-10-CM | POA: Diagnosis not present

## 2022-07-07 DIAGNOSIS — E871 Hypo-osmolality and hyponatremia: Secondary | ICD-10-CM | POA: Diagnosis not present

## 2022-07-07 DIAGNOSIS — E1021 Type 1 diabetes mellitus with diabetic nephropathy: Secondary | ICD-10-CM | POA: Diagnosis not present

## 2022-07-07 DIAGNOSIS — L4050 Arthropathic psoriasis, unspecified: Secondary | ICD-10-CM | POA: Diagnosis not present

## 2022-07-13 DIAGNOSIS — E871 Hypo-osmolality and hyponatremia: Secondary | ICD-10-CM | POA: Diagnosis not present

## 2022-07-14 LAB — HM DIABETES EYE EXAM

## 2022-07-15 ENCOUNTER — Ambulatory Visit (INDEPENDENT_AMBULATORY_CARE_PROVIDER_SITE_OTHER): Payer: Medicare Other | Admitting: Urology

## 2022-07-15 ENCOUNTER — Encounter: Payer: Self-pay | Admitting: Urology

## 2022-07-15 VITALS — BP 130/61 | HR 62

## 2022-07-15 DIAGNOSIS — R351 Nocturia: Secondary | ICD-10-CM

## 2022-07-15 DIAGNOSIS — R350 Frequency of micturition: Secondary | ICD-10-CM | POA: Diagnosis not present

## 2022-07-15 DIAGNOSIS — N401 Enlarged prostate with lower urinary tract symptoms: Secondary | ICD-10-CM

## 2022-07-15 MED ORDER — FESOTERODINE FUMARATE ER 8 MG PO TB24: 8.0000 mg | ORAL_TABLET | Freq: Every day | ORAL | 11 refills | Status: DC

## 2022-07-15 NOTE — Progress Notes (Unsigned)
07/15/2022 3:25 PM   Christopher Conner 1951/04/16 [id number]  Referring provider: Merrilee Seashore, [place] [address],  [place] [address]  Followup nocturia   HPI: Christopher Conner is a 72yo here for folowup for nocturia and urinary frequency. He was started on DDAVP last visit. He notes his nocturia 0-2x. His sodium decreased to 125 on DDAVP.  He notes increased daytime frequency. Urine stream strong. 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    Surgical History: Past Surgical History:  Procedure Laterality Date   APPENDECTOMY  early 20's   BACK SURGERY     CATARACT EXTRACTION Bilateral    left eye prostheisi "shell"   CYST REMOVAL NECK  yrs ago   [place]  april/march 2020   done in office   [place] [name] 10/12/2019   Procedure: CYSTOSCOPY WITH INSERTION OF UROLIFT;  Surgeon: Cleon Gustin, MD;  Location: [facility];  Service: Urology;  Laterality: [name];   LUMBAR FUSION  1998 and 2000   l5 to s1 1998, then l4 and l5 in 2000   VENTRICULOPERITONEAL SHUNT  2006    Home Medications:  Allergies as of 07/15/2022       Reactions   Lisinopril Cough   Latex Itching, Rash        Medication List        Accurate as of July 15, 2022  3:25 PM. If you have any questions, ask your nurse or doctor.          Acetyl L-Carnitine 250 MG Caps 1 capsule   Alpha-Lipoic Acid 300 MG Caps 1 capsule   amLODipine 10 MG tablet Commonly known as: NORVASC Take 1 tablet by mouth daily.   Astragalus Root Powd See admin  instructions.   atorvastatin 80 MG tablet Commonly known as: LIPITOR Take 40 mg by mouth every evening.   azithromycin 250 MG tablet Commonly known as: ZITHROMAX Take 250 mg by mouth as directed.   B-100 COMPLEX CR PO 1 tablet   brompheniramine-pseudoephedrine-DM 30-2-10 MG/5ML syrup Take 5 mLs by mouth 3 (three) times daily as needed.   carvedilol 3.125 MG tablet Commonly known as: COREG Take 3.125 mg by mouth 2 (two) times daily with a meal.   Contour Next Test test strip Generic drug: glucose blood 2 (two) times daily.   CoQ-10 100 MG capsule See admin instructions.   desmopressin 0.2 MG tablet Commonly known as: DDAVP Take 0.5 tablets (0.1 mg total) by mouth at bedtime.   doxycycline 100 MG tablet Commonly known as: VIBRA-TABS Take 1 tablet (100 mg total) by mouth 2 (two) times daily.   fluticasone 50 MCG/ACT nasal spray Commonly known as: FLONASE Place 2 sprays into both nostrils daily.   folic acid 1 MG tablet Commonly known as: FOLVITE Take 1 mg by mouth daily.   Green Tea 150 MG Caps See admin instructions.   ketoconazole 2 % cream Commonly known as: NIZORAL Apply 1 fingertip amount to each foot daily.  meclizine 25 MG tablet Commonly known as: ANTIVERT Take 1 tablet (25 mg total) by mouth 3 (three) times daily as needed for dizziness.   meloxicam 15 MG tablet Commonly known as: MOBIC Take 15 mg by mouth daily.   methotrexate 2.5 MG tablet Commonly known as: RHEUMATREX TAKE 8 TABLETS BY MOUTH 1 TIME A WEEK AS DIRECTED   neomycin-polymyxin-hydrocortisone OTIC solution Commonly known as: CORTISPORIN Apply 2 drops to the ingrown toenail site twice daily. Cover with band-aid.   NovoLOG 100 UNIT/ML injection Generic drug: insulin aspart INJECT UP TO 100 UNITS DAILY MAXIMUM DOSE WITH INSULIN PUMP DAILY   OMEGA 3-6-9 FATTY ACIDS PO See admin instructions.   ondansetron 8 MG disintegrating tablet Commonly known as: Zofran ODT 8mg  ODT q6  hours prn nausea   OVER THE COUNTER MEDICATION Omega 369 1 bid   OVER THE COUNTER MEDICATION Vitamin c 1000 mg daily   OVER THE COUNTER MEDICATION Vitamin d 3 2000 iu daily   OVER THE COUNTER MEDICATION probiotiotic qday   OVER THE COUNTER MEDICATION Methyl folate 1000 mg qd   OVER THE COUNTER MEDICATION Green tea extract 400 mg qd   OVER THE COUNTER MEDICATION Astragalis root 470 mg daily   OVER THE COUNTER MEDICATION Grape seed extract 200 mg qd   OVER THE COUNTER MEDICATION Alpha lipoic acid 600mg  qd   OVER THE COUNTER MEDICATION Acetyl l-caritine 500 gm qd   OVER THE COUNTER MEDICATION reservetrol 50 mg qd   OVER THE COUNTER MEDICATION coq10 120 mg qd   OVER THE COUNTER MEDICATION b complex 100 mg qd   OVER THE COUNTER MEDICATION Calcium 630 mg qd   REFRESH OP Apply 1-2 drops to eye every 3 (three) hours as needed (dryness).   solifenacin 5 MG tablet Commonly known as: VESICARE TAKE ONE TABLET BY MOUTH EVERY MORNING AND TAKE ONE TABLET BY MOUTH EVERY NIGHT AT BEDTIME   telmisartan 40 MG tablet Commonly known as: MICARDIS 1 tablet   tiZANidine 2 MG tablet Commonly known as: ZANAFLEX Take 2 mg by mouth 3 (three) times daily as needed.   Vitamin C & D3/Rose Hips [id number] MG-UNIT-MG Caps Generic drug: Vit C-Cholecalciferol-Rose Hip See admin instructions.   Vitamin D 50 MCG (2000 UT) Caps 1 capsule        Allergies:  Allergies  Allergen Reactions   Lisinopril Cough   Latex Itching and Rash    Family History: Family History  Problem Relation Age of Onset   Hypertension Mother    Hyperlipidemia Mother    Diabetes Mother    Diabetes Father     Social History:  reports that he has never smoked. He has never used smokeless tobacco. He reports that he does not drink alcohol and does not use drugs.  ROS: All other review of systems were reviewed and are negative except what is noted above in HPI  Physical Exam: BP 130/61   Pulse  62   Constitutional:  Alert and oriented, No acute distress. HEENT: [place] AT, moist mucus membranes.  Trachea midline, no masses. Cardiovascular: No clubbing, cyanosis, or edema. Respiratory: Normal respiratory effort, no increased work of breathing. GI: Abdomen is soft, nontender, nondistended, no abdominal masses GU: No CVA tenderness.  Lymph: No cervical or inguinal lymphadenopathy. Skin: No rashes, bruises or suspicious lesions. Neurologic: Grossly intact, no focal deficits, moving all 4 extremities. Psychiatric: Normal mood and affect.  Laboratory Data: Lab Results  Component Value Date   WBC 15.6 (H) 06/12/2022  HGB 11.9 (L) 06/12/2022   HCT 35.1 (L) 06/12/2022   MCV 102.0 (H) 06/12/2022   PLT 247 06/12/2022    Lab Results  Component Value Date   CREATININE 0.80 06/12/2022    No results found for: "PSA"  No results found for: "TESTOSTERONE"  Lab Results  Component Value Date   HGBA1C 6.7 (H) 04/10/2014    Urinalysis    Component Value Date/Time   COLORURINE YELLOW 08/16/2015 0244   APPEARANCEUR Clear 06/16/2022 1412   LABSPEC 1.029 08/16/2015 0244   PHURINE 5.5 08/16/2015 0244   GLUCOSEU Negative 06/16/2022 1412   HGBUR NEGATIVE 08/16/2015 0244   BILIRUBINUR Negative 06/16/2022 1412   KETONESUR >80 (A) 08/16/2015 0244   PROTEINUR Negative 06/16/2022 1412   PROTEINUR NEGATIVE 08/16/2015 0244   UROBILINOGEN 0.2 04/09/2014 1947   NITRITE Negative 06/16/2022 1412   NITRITE NEGATIVE 08/16/2015 0244   LEUKOCYTESUR Negative 06/16/2022 1412    Lab Results  Component Value Date   LABMICR Comment 06/16/2022   BACTERIA RARE (A) 08/16/2015    Pertinent Imaging:  Results for orders placed during the hospital encounter of 05/23/10  DG Abd 1 View  Narrative *RADIOLOGY REPORT*  Clinical Data: Weakness with nausea and vomiting.  ABDOMEN - 1 VIEW  Comparison: 02/10/2010.  Findings: Ventriculoperitoneal shunt tubing extends into the right mid abdomen.   There is no shunt discontinuity.  Moderate stool is noted throughout the colon.  The bowel gas pattern is nonobstructive.  There are postsurgical changes of the lower lumbar spine status post fusion.  IMPRESSION:  1.  Moderate stool throughout the colon suggesting constipation. 2.  Intact ventricular peritoneal shunt.  Original Report Authenticated By: Vivia Ewing, M.D.  No results found for this or any previous visit.  No results found for this or any previous visit.  No results found for this or any previous visit.  No results found for this or any previous visit.  No valid procedures specified. No results found for this or any previous visit.  No results found for this or any previous visit.   Assessment & Plan:    1. Urinary frequency -we will stop DDAVP - Urinalysis, Routine w reflex microscopic  2. Benign localized prostatic hyperplasia with lower urinary tract symptoms (LUTS) -we will trial toviaz 8mg  daily - Urinalysis, Routine w reflex microscopic  3. Nocturia -toviaz 8mg  daily - Urinalysis, Routine w reflex microscopic   No follow-ups on file.  Nicolette Bang, MD  [facility] [place]

## 2022-07-16 ENCOUNTER — Encounter: Payer: Self-pay | Admitting: Urology

## 2022-07-16 DIAGNOSIS — E109 Type 1 diabetes mellitus without complications: Secondary | ICD-10-CM | POA: Diagnosis not present

## 2022-07-16 DIAGNOSIS — K5900 Constipation, unspecified: Secondary | ICD-10-CM | POA: Diagnosis not present

## 2022-07-16 DIAGNOSIS — E782 Mixed hyperlipidemia: Secondary | ICD-10-CM | POA: Diagnosis not present

## 2022-07-16 DIAGNOSIS — Z1211 Encounter for screening for malignant neoplasm of colon: Secondary | ICD-10-CM | POA: Diagnosis not present

## 2022-07-16 DIAGNOSIS — I10 Essential (primary) hypertension: Secondary | ICD-10-CM | POA: Diagnosis not present

## 2022-07-16 LAB — URINALYSIS, ROUTINE W REFLEX MICROSCOPIC
Bilirubin, UA: NEGATIVE
Glucose, UA: NEGATIVE
Leukocytes,UA: NEGATIVE
Nitrite, UA: NEGATIVE
Protein,UA: NEGATIVE
RBC, UA: NEGATIVE
Specific Gravity, UA: 1.02 (ref 1.005–1.030)
Urobilinogen, Ur: 0.2 mg/dL (ref 0.2–1.0)
pH, UA: 6 (ref 5.0–7.5)

## 2022-07-16 NOTE — Patient Instructions (Signed)

## 2022-07-28 ENCOUNTER — Other Ambulatory Visit: Payer: Medicare Other

## 2022-07-28 ENCOUNTER — Telehealth: Payer: Self-pay

## 2022-07-28 DIAGNOSIS — R351 Nocturia: Secondary | ICD-10-CM | POA: Diagnosis not present

## 2022-07-28 NOTE — Telephone Encounter (Signed)
Patient's insurance is requesting verification of  fesoterodine (TOVIAZ) 8 MG TB24 tablet.  Wanting to know what the status, if it was done for refill.   Patient is still taking  solifenacin (VESICARE) 5 MG tablet  Currently until above medication can be filled.  Please advise.

## 2022-07-28 NOTE — Telephone Encounter (Signed)
Patient aware PA for Christopher Conner was approved, he will contact his pharmacy.

## 2022-07-29 DIAGNOSIS — E7800 Pure hypercholesterolemia, unspecified: Secondary | ICD-10-CM | POA: Diagnosis not present

## 2022-07-29 DIAGNOSIS — I10 Essential (primary) hypertension: Secondary | ICD-10-CM | POA: Diagnosis not present

## 2022-07-29 DIAGNOSIS — E1021 Type 1 diabetes mellitus with diabetic nephropathy: Secondary | ICD-10-CM | POA: Diagnosis not present

## 2022-07-29 DIAGNOSIS — G912 (Idiopathic) normal pressure hydrocephalus: Secondary | ICD-10-CM | POA: Diagnosis not present

## 2022-07-29 DIAGNOSIS — E871 Hypo-osmolality and hyponatremia: Secondary | ICD-10-CM | POA: Diagnosis not present

## 2022-07-29 DIAGNOSIS — N182 Chronic kidney disease, stage 2 (mild): Secondary | ICD-10-CM | POA: Diagnosis not present

## 2022-07-29 DIAGNOSIS — L4050 Arthropathic psoriasis, unspecified: Secondary | ICD-10-CM | POA: Diagnosis not present

## 2022-07-29 DIAGNOSIS — I700 Atherosclerosis of aorta: Secondary | ICD-10-CM | POA: Diagnosis not present

## 2022-07-29 LAB — BASIC METABOLIC PANEL
BUN/Creatinine Ratio: 24 (ref 10–24)
BUN: 23 mg/dL (ref 8–27)
CO2: 25 mmol/L (ref 20–29)
Calcium: 9.5 mg/dL (ref 8.6–10.2)
Chloride: 99 mmol/L (ref 96–106)
Creatinine, Ser: 0.95 mg/dL (ref 0.76–1.27)
Glucose: 41 mg/dL — ABNORMAL LOW (ref 70–99)
Potassium: 4.9 mmol/L (ref 3.5–5.2)
Sodium: 137 mmol/L (ref 134–144)
eGFR: 86 mL/min/{1.73_m2} (ref >59)

## 2022-07-30 ENCOUNTER — Telehealth: Payer: Self-pay

## 2022-07-30 NOTE — Telephone Encounter (Signed)
Patient is asking what is the best time of the day to take his Toviaz rx, and if he needs to take with food.  He already contacted his pharmacy and they told him to confirm with you.

## 2022-07-31 NOTE — Telephone Encounter (Signed)
Patient is aware of MD recommendation and voiced understanding

## 2022-08-04 DIAGNOSIS — I700 Atherosclerosis of aorta: Secondary | ICD-10-CM | POA: Diagnosis not present

## 2022-08-04 DIAGNOSIS — E1021 Type 1 diabetes mellitus with diabetic nephropathy: Secondary | ICD-10-CM | POA: Diagnosis not present

## 2022-08-04 DIAGNOSIS — E7800 Pure hypercholesterolemia, unspecified: Secondary | ICD-10-CM | POA: Diagnosis not present

## 2022-08-04 DIAGNOSIS — C9190 Lymphoid leukemia, unspecified not having achieved remission: Secondary | ICD-10-CM | POA: Diagnosis not present

## 2022-08-04 DIAGNOSIS — L4050 Arthropathic psoriasis, unspecified: Secondary | ICD-10-CM | POA: Diagnosis not present

## 2022-08-04 DIAGNOSIS — M21371 Foot drop, right foot: Secondary | ICD-10-CM | POA: Diagnosis not present

## 2022-08-04 DIAGNOSIS — E871 Hypo-osmolality and hyponatremia: Secondary | ICD-10-CM | POA: Diagnosis not present

## 2022-08-04 DIAGNOSIS — M21372 Foot drop, left foot: Secondary | ICD-10-CM | POA: Diagnosis not present

## 2022-08-04 DIAGNOSIS — M1990 Unspecified osteoarthritis, unspecified site: Secondary | ICD-10-CM | POA: Diagnosis not present

## 2022-08-04 DIAGNOSIS — G912 (Idiopathic) normal pressure hydrocephalus: Secondary | ICD-10-CM | POA: Diagnosis not present

## 2022-08-04 DIAGNOSIS — N182 Chronic kidney disease, stage 2 (mild): Secondary | ICD-10-CM | POA: Diagnosis not present

## 2022-08-04 DIAGNOSIS — Z23 Encounter for immunization: Secondary | ICD-10-CM | POA: Diagnosis not present

## 2022-08-04 DIAGNOSIS — I10 Essential (primary) hypertension: Secondary | ICD-10-CM | POA: Diagnosis not present

## 2022-08-07 DIAGNOSIS — Z23 Encounter for immunization: Secondary | ICD-10-CM | POA: Diagnosis not present

## 2022-08-14 ENCOUNTER — Other Ambulatory Visit: Payer: Medicare Other

## 2022-08-14 ENCOUNTER — Ambulatory Visit: Payer: Medicare Other | Admitting: Hematology

## 2022-08-26 ENCOUNTER — Ambulatory Visit: Payer: Medicare Other | Admitting: Urology

## 2022-09-01 DIAGNOSIS — D2262 Melanocytic nevi of left upper limb, including shoulder: Secondary | ICD-10-CM | POA: Diagnosis not present

## 2022-09-01 DIAGNOSIS — D225 Melanocytic nevi of trunk: Secondary | ICD-10-CM | POA: Diagnosis not present

## 2022-09-01 DIAGNOSIS — L821 Other seborrheic keratosis: Secondary | ICD-10-CM | POA: Diagnosis not present

## 2022-09-01 DIAGNOSIS — Z08 Encounter for follow-up examination after completed treatment for malignant neoplasm: Secondary | ICD-10-CM | POA: Diagnosis not present

## 2022-09-01 DIAGNOSIS — L570 Actinic keratosis: Secondary | ICD-10-CM | POA: Diagnosis not present

## 2022-09-01 DIAGNOSIS — Z85828 Personal history of other malignant neoplasm of skin: Secondary | ICD-10-CM | POA: Diagnosis not present

## 2022-09-01 DIAGNOSIS — D2261 Melanocytic nevi of right upper limb, including shoulder: Secondary | ICD-10-CM | POA: Diagnosis not present

## 2022-09-01 DIAGNOSIS — L814 Other melanin hyperpigmentation: Secondary | ICD-10-CM | POA: Diagnosis not present

## 2022-09-01 DIAGNOSIS — D485 Neoplasm of uncertain behavior of skin: Secondary | ICD-10-CM | POA: Diagnosis not present

## 2022-09-02 ENCOUNTER — Ambulatory Visit (INDEPENDENT_AMBULATORY_CARE_PROVIDER_SITE_OTHER): Payer: Medicare Other | Admitting: Urology

## 2022-09-02 VITALS — BP 123/67 | HR 62

## 2022-09-02 DIAGNOSIS — R351 Nocturia: Secondary | ICD-10-CM | POA: Diagnosis not present

## 2022-09-02 DIAGNOSIS — N401 Enlarged prostate with lower urinary tract symptoms: Secondary | ICD-10-CM | POA: Diagnosis not present

## 2022-09-02 DIAGNOSIS — R350 Frequency of micturition: Secondary | ICD-10-CM | POA: Diagnosis not present

## 2022-09-02 MED ORDER — FESOTERODINE FUMARATE ER 8 MG PO TB24: 8.0000 mg | ORAL_TABLET | Freq: Every day | ORAL | 11 refills | Status: DC

## 2022-09-02 NOTE — Progress Notes (Signed)
09/02/2022 10:40 AM   Christopher Conner 1950-12-21 [id number]  Referring provider: Georgianne Fick, MD [address] [place],  [place] [address]  Followup nocturia and urinary frequency   HPI: [name] is a 72yo here for followup nocturia and urinary frequency. He stopped DDAVP which normalized his sodium. He was started on [place] and he has been on it for 5 weeks. Nocturia has not improved. He has nocturia 3-4x. He urgency and frequency is only marginally improved. He has drymouth. He is decreasing his fluid intake which is failing to help his urinary frequency.    PMH: Past Medical History:  Diagnosis Date   Arthritis    BPH (benign prostatic hyperplasia)    Chronic lymphocytic leukemia (HCC) early 2018   no current treatment   CLL (chronic lymphocytic leukemia) (HCC)    DM type 1 (diabetes mellitus, type 1) (HCC)    type 1   High cholesterol    Hypertension    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    Surgical History: Past Surgical History:  Procedure Laterality Date   APPENDECTOMY  early 20's   BACK SURGERY     CATARACT EXTRACTION Bilateral    left eye prostheisi "shell"   CYST REMOVAL NECK  yrs ago   CYSTOSCOPY WITH INSERTION OF UROLIFT  april/march 2020   done in office   CYSTOSCOPY WITH INSERTION OF UROLIFT [name] 10/12/2019   Procedure: CYSTOSCOPY WITH INSERTION OF UROLIFT;  Surgeon: Malen Gauze, MD;  Location: [facility];  Service: Urology;  Laterality: [name];   LUMBAR FUSION  1998 and 2000   l5 to s1 1998, then l4 and l5 in 2000   VENTRICULOPERITONEAL SHUNT  2006    Home Medications:  Allergies as of 09/02/2022       Reactions   Lisinopril Cough   Latex Itching, Rash        Medication List        Accurate as of Sep 02, 2022 10:40 AM. If you have any questions, ask your nurse or doctor.          Acetyl L-Carnitine 250 MG Caps 1 capsule   Alpha-Lipoic Acid 300 MG Caps 1  capsule   amLODipine 10 MG tablet Commonly known as: NORVASC Take 1 tablet by mouth daily.   Astragalus Root Powd See admin instructions.   atorvastatin 80 MG tablet Commonly known as: LIPITOR Take 40 mg by mouth every evening.   azithromycin 250 MG tablet Commonly known as: ZITHROMAX Take 250 mg by mouth as directed.   B-100 COMPLEX CR PO 1 tablet   brompheniramine-pseudoephedrine-DM 30-2-10 MG/5ML syrup Take 5 mLs by mouth 3 (three) times daily as needed.   carvedilol 3.125 MG tablet Commonly known as: COREG Take 3.125 mg by mouth 2 (two) times daily with a meal.   Contour Next Test test strip Generic drug: glucose blood 2 (two) times daily.   CoQ-10 100 MG capsule See admin instructions.   doxycycline 100 MG tablet Commonly known as: VIBRA-TABS Take 1 tablet (100 mg total) by mouth 2 (two) times daily.   fesoterodine 8 MG Tb24 tablet Commonly known as: TOVIAZ Take 1 tablet (8 mg total) by mouth daily.   fluticasone 50 MCG/ACT nasal spray Commonly known as: FLONASE Place 2 sprays into both nostrils daily.   folic acid 1 MG tablet Commonly known as: FOLVITE Take 1 mg by mouth daily.   Green Tea 150 MG Caps See admin  instructions.   ketoconazole 2 % cream Commonly known as: NIZORAL Apply 1 fingertip amount to each foot daily.   meclizine 25 MG tablet Commonly known as: ANTIVERT Take 1 tablet (25 mg total) by mouth 3 (three) times daily as needed for dizziness.   meloxicam 15 MG tablet Commonly known as: MOBIC Take 15 mg by mouth daily.   methotrexate 2.5 MG tablet Commonly known as: RHEUMATREX TAKE 8 TABLETS BY MOUTH 1 TIME A WEEK AS DIRECTED   neomycin-polymyxin-hydrocortisone OTIC solution Commonly known as: CORTISPORIN Apply 2 drops to the ingrown toenail site twice daily. Cover with band-aid.   NovoLOG 100 UNIT/ML injection Generic drug: insulin aspart INJECT UP TO 100 UNITS DAILY MAXIMUM DOSE WITH INSULIN PUMP DAILY   OMEGA 3-6-9 FATTY  ACIDS PO See admin instructions.   ondansetron 8 MG disintegrating tablet Commonly known as: Zofran ODT 8mg  ODT q6 hours prn nausea   OVER THE COUNTER MEDICATION Omega 369 1 bid   OVER THE COUNTER MEDICATION Vitamin c 1000 mg daily   OVER THE COUNTER MEDICATION Vitamin d 3 2000 iu daily   OVER THE COUNTER MEDICATION probiotiotic qday   OVER THE COUNTER MEDICATION Methyl folate 1000 mg qd   OVER THE COUNTER MEDICATION Green tea extract 400 mg qd   OVER THE COUNTER MEDICATION Astragalis root 470 mg daily   OVER THE COUNTER MEDICATION Grape seed extract 200 mg qd   OVER THE COUNTER MEDICATION Alpha lipoic acid 600mg  qd   OVER THE COUNTER MEDICATION Acetyl l-caritine 500 gm qd   OVER THE COUNTER MEDICATION reservetrol 50 mg qd   OVER THE COUNTER MEDICATION coq10 120 mg qd   OVER THE COUNTER MEDICATION b complex 100 mg qd   OVER THE COUNTER MEDICATION Calcium 630 mg qd   REFRESH OP Apply 1-2 drops to eye every 3 (three) hours as needed (dryness).   solifenacin 5 MG tablet Commonly known as: VESICARE TAKE ONE TABLET BY MOUTH EVERY MORNING AND TAKE ONE TABLET BY MOUTH EVERY NIGHT AT BEDTIME   telmisartan 40 MG tablet Commonly known as: MICARDIS 1 tablet   tiZANidine 2 MG tablet Commonly known as: ZANAFLEX Take 2 mg by mouth 3 (three) times daily as needed.   Vitamin C & D3/Rose Hips [phone] MG-UNIT-MG Caps Generic drug: Vit C-Cholecalciferol-Rose Hip See admin instructions.   Vitamin D 50 MCG (2000 UT) Caps 1 capsule        Allergies:  Allergies  Allergen Reactions   Lisinopril Cough   Latex Itching and Rash    Family History: Family History  Problem Relation Age of Onset   Hypertension Mother    Hyperlipidemia Mother    Diabetes Mother    Diabetes Father     Social History:  reports that he has never smoked. He has never used smokeless tobacco. He reports that he does not drink alcohol and does not use drugs.  ROS: All other  review of systems were reviewed and are negative except what is noted above in HPI  Physical Exam: BP 123/67   Pulse 62   Constitutional:  Alert and oriented, No acute distress. HEENT: [place] AT, moist mucus membranes.  Trachea midline, no masses. Cardiovascular: No clubbing, cyanosis, or edema. Respiratory: Normal respiratory effort, no increased work of breathing. GI: Abdomen is soft, nontender, nondistended, no abdominal masses GU: No CVA tenderness.  Lymph: No cervical or inguinal lymphadenopathy. Skin: No rashes, bruises or suspicious lesions. Neurologic: Grossly intact, no focal deficits, moving all 4 extremities. Psychiatric:  Normal mood and affect.  Laboratory Data: Lab Results  Component Value Date   WBC 15.6 (H) 06/12/2022   HGB 11.9 (L) 06/12/2022   HCT 35.1 (L) 06/12/2022   MCV 102.0 (H) 06/12/2022   PLT 247 06/12/2022    Lab Results  Component Value Date   CREATININE 0.95 07/28/2022    No results found for: "PSA"  No results found for: "TESTOSTERONE"  Lab Results  Component Value Date   HGBA1C 6.7 (H) 04/10/2014    Urinalysis    Component Value Date/Time   COLORURINE YELLOW 08/16/2015 0244   APPEARANCEUR Clear 07/15/2022 1523   LABSPEC 1.029 08/16/2015 0244   PHURINE 5.5 08/16/2015 0244   GLUCOSEU Negative 07/15/2022 1523   HGBUR NEGATIVE 08/16/2015 0244   BILIRUBINUR Negative 07/15/2022 1523   KETONESUR >80 (A) 08/16/2015 0244   PROTEINUR Negative 07/15/2022 1523   PROTEINUR NEGATIVE 08/16/2015 0244   UROBILINOGEN 0.2 04/09/2014 1947   NITRITE Negative 07/15/2022 1523   NITRITE NEGATIVE 08/16/2015 0244   LEUKOCYTESUR Negative 07/15/2022 1523    Lab Results  Component Value Date   LABMICR Comment 07/15/2022   BACTERIA RARE (A) 08/16/2015    Pertinent Imaging:  Results for orders placed during the hospital encounter of 05/23/10  DG Abd 1 View  Narrative *RADIOLOGY REPORT*  Clinical Data: Weakness with nausea and vomiting.  ABDOMEN -  1 VIEW  Comparison: 02/10/2010.  Findings: Ventriculoperitoneal shunt tubing extends into the right mid abdomen.  There is no shunt discontinuity.  Moderate stool is noted throughout the colon.  The bowel gas pattern is nonobstructive.  There are postsurgical changes of the lower lumbar spine status post fusion.  IMPRESSION:  1.  Moderate stool throughout the colon suggesting constipation. 2.  Intact ventricular peritoneal shunt.  Original Report Authenticated By: Gerrianne Scale, M.D.  No results found for this or any previous visit.  No results found for this or any previous visit.  No results found for this or any previous visit.  No results found for this or any previous visit.  No valid procedures specified. No results found for this or any previous visit.  No results found for this or any previous visit.   Assessment & Plan:    1. Urinary frequency -We discussed continue medical therapy, intravesical botox, PTNS and interstim.  - Urinalysis, Routine w reflex microscopic  2. Nocturia We discussed continue medical therapy, intravesical botox, PTNS and interstim.    No follow-ups on file.  Wilkie Aye, MD  [facility] [place]

## 2022-09-08 ENCOUNTER — Encounter: Payer: Self-pay | Admitting: Urology

## 2022-09-08 DIAGNOSIS — E109 Type 1 diabetes mellitus without complications: Secondary | ICD-10-CM | POA: Diagnosis not present

## 2022-09-08 DIAGNOSIS — Z79899 Other long term (current) drug therapy: Secondary | ICD-10-CM | POA: Diagnosis not present

## 2022-09-08 DIAGNOSIS — I10 Essential (primary) hypertension: Secondary | ICD-10-CM | POA: Diagnosis not present

## 2022-09-08 DIAGNOSIS — M7989 Other specified soft tissue disorders: Secondary | ICD-10-CM | POA: Diagnosis not present

## 2022-09-08 DIAGNOSIS — E871 Hypo-osmolality and hyponatremia: Secondary | ICD-10-CM | POA: Diagnosis not present

## 2022-09-08 DIAGNOSIS — C9190 Lymphoid leukemia, unspecified not having achieved remission: Secondary | ICD-10-CM | POA: Diagnosis not present

## 2022-09-08 DIAGNOSIS — L409 Psoriasis, unspecified: Secondary | ICD-10-CM | POA: Diagnosis not present

## 2022-09-08 DIAGNOSIS — L4050 Arthropathic psoriasis, unspecified: Secondary | ICD-10-CM | POA: Diagnosis not present

## 2022-09-08 DIAGNOSIS — M79646 Pain in unspecified finger(s): Secondary | ICD-10-CM | POA: Diagnosis not present

## 2022-09-08 DIAGNOSIS — M79643 Pain in unspecified hand: Secondary | ICD-10-CM | POA: Diagnosis not present

## 2022-09-08 DIAGNOSIS — Z9641 Presence of insulin pump (external) (internal): Secondary | ICD-10-CM | POA: Diagnosis not present

## 2022-09-08 DIAGNOSIS — E1021 Type 1 diabetes mellitus with diabetic nephropathy: Secondary | ICD-10-CM | POA: Diagnosis not present

## 2022-09-08 DIAGNOSIS — M1990 Unspecified osteoarthritis, unspecified site: Secondary | ICD-10-CM | POA: Diagnosis not present

## 2022-09-08 DIAGNOSIS — E7800 Pure hypercholesterolemia, unspecified: Secondary | ICD-10-CM | POA: Diagnosis not present

## 2022-09-08 NOTE — Patient Instructions (Signed)

## 2022-10-12 DIAGNOSIS — Z1211 Encounter for screening for malignant neoplasm of colon: Secondary | ICD-10-CM | POA: Diagnosis not present

## 2022-10-12 DIAGNOSIS — Z1212 Encounter for screening for malignant neoplasm of rectum: Secondary | ICD-10-CM | POA: Diagnosis not present

## 2022-10-17 LAB — COLOGUARD: COLOGUARD: NEGATIVE

## 2022-10-19 ENCOUNTER — Ambulatory Visit (INDEPENDENT_AMBULATORY_CARE_PROVIDER_SITE_OTHER): Payer: Medicare Other | Admitting: Nurse Practitioner

## 2022-10-19 ENCOUNTER — Encounter: Payer: Self-pay | Admitting: Nurse Practitioner

## 2022-10-19 VITALS — BP 132/58 | HR 64 | Resp 16 | Ht 68.75 in | Wt 170.6 lb

## 2022-10-19 DIAGNOSIS — E871 Hypo-osmolality and hyponatremia: Secondary | ICD-10-CM | POA: Diagnosis not present

## 2022-10-19 DIAGNOSIS — E1069 Type 1 diabetes mellitus with other specified complication: Secondary | ICD-10-CM

## 2022-10-19 DIAGNOSIS — R0989 Other specified symptoms and signs involving the circulatory and respiratory systems: Secondary | ICD-10-CM | POA: Diagnosis not present

## 2022-10-19 DIAGNOSIS — M21371 Foot drop, right foot: Secondary | ICD-10-CM | POA: Diagnosis not present

## 2022-10-19 DIAGNOSIS — I10 Essential (primary) hypertension: Secondary | ICD-10-CM | POA: Diagnosis not present

## 2022-10-19 DIAGNOSIS — R350 Frequency of micturition: Secondary | ICD-10-CM

## 2022-10-19 DIAGNOSIS — G912 (Idiopathic) normal pressure hydrocephalus: Secondary | ICD-10-CM | POA: Diagnosis not present

## 2022-10-19 DIAGNOSIS — R634 Abnormal weight loss: Secondary | ICD-10-CM | POA: Diagnosis not present

## 2022-10-19 DIAGNOSIS — M21372 Foot drop, left foot: Secondary | ICD-10-CM

## 2022-10-19 DIAGNOSIS — C9110 Chronic lymphocytic leukemia of B-cell type not having achieved remission: Secondary | ICD-10-CM

## 2022-10-19 NOTE — Patient Instructions (Addendum)
I have sent in the order for the chest x-ray to [facility] at [address]. You can walk in to have this done.   I want to start here to see if this can be a cause of some of the symptoms you are having.   I would like to do a little more review of your chart and the past records to see what has been evaluated.

## 2022-10-19 NOTE — Progress Notes (Signed)
Christopher Eth, DNP, AGNP-c [facility] [address] [place], [place] [address] Main Office [phone]   New patient visit   Patient: Christopher Conner   DOB: 1950/11/13   72 y.o. Male  MRN: [id number] Visit Date: 10/19/2022  Patient Care Team: , Christopher Amabile, NP as PCP - General (Nurse Practitioner) Christopher Abu, MD as Consulting Physician (Neurosurgery) Christopher Conner with Endocrinology  Today's Vitals   10/19/22 1404  BP: (!) 132/58  Pulse: 64  Resp: 16  SpO2: 98%  Weight: 170 lb 9.6 oz (77.4 kg)  Height: 5' 8.75" (1.746 m)   Body mass index is 25.38 kg/m.   Today's healthcare provider: Tollie Eth, NP   Chief Complaint  Patient presents with   Establish Care    New patient establishing care.    Subjective    Christopher Conner is a 72 y.o. male who presents today as a new patient to establish care.    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 Previously he provided similar care for both his mother-in-law and father-in-law until their passing. He describes the emotional and logistical challenges associated with managing these responsibilities alongside his own health issues.  He discusses his mother's current health situation, noting her dementia-like condition with frequent falls, seizures, and recent non-compliance with medication, which includes stopping seizure medications for three days. He expresses concern about her unreliable communication and the difficulty of managing her care from a distance. Christopher Conner also mentions his sister's mental health struggles and her recent engagement with psychiatric care, highlighting the complexity of having two family members with mental health issues in the same household. He currently is relying on a brother who lives near his mother to help  provide care for her and keep him informed of the current situation.   Christopher Conner provides a detailed account of his own health history, including managing type 1 diabetes since age 30, now controlled with a CGM and tandem insulin pump, resulting in consistent A1Cs around 5.9-5.8. He reflects on the  years of his diabetes management and the long-term physical impacts. Christopher Conner notes his diagnosis of CLL, for which he is not currently receiving treatment but is monitored annually, at this time this is considered stable. He also mentions a history of skin cancer and the physical scars from its removal. He describes undergoing spinal fusions and the placement of a brain shunt in 2006 to manage hydrocephalus, which has required occasional reprogramming due to exposure to magnets.   Christopher Conner is concerned about recent unexplained weight loss and excessive urination, which he feels is not adequately controlled despite various medications. This is a primary concern for him today. He reports that since January of this year he has noted a significant loss of muscle mass. He does report stopping regular gym activities at that time, but he does not feel that the changes should account for the significant weight change he has noticed. He is concerned that this may be related to fluid loss.   He is suffering from excessive urination, despite medications for this. He is concerned there is a correlation with his weight loss. He has noted low sodium levels several times on labs. He reports waking throughout the night about every 1-1.5 hours to urinate and urinating about the same throughout the day. He tells me that no matter how much  water he consumes he feels that he is not able to retain any of this fluid in his body. He is seeing urology for this issue, but no conclusive diagnosis has been determined. He recently underwent a 24 hour urine and output was more than a gallon. Results of that test are unclear at this time.   He mentions  concerns with increased mucous in his throat every morning since having COVID about 2 years ago. He reports the coloration is gray/tan/yellow in color. He reports he constantly feels that his nasal passages are clogged. He denies a cough, fevers, chills, night sweats, or sinus pain/pressure.   He currently is dealing with foot drop and has previously had evaluation with podiatry, with recommendation for surgical intervention. At the time he was not interested in this procedure, but he feels that the symptoms have progressed to the point that further intervention is needed. His previous podiatrist has since let practice, so he will need a new referral today.   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    History reviewed and reveals the following: Past Medical History:  Diagnosis Date   Arthritis    BPH (benign prostatic hyperplasia)    Cataract    Chronic lymphocytic leukemia (HCC)  2018   no current treatment   CLL (chronic lymphocytic leukemia) (HCC)    DM type 1 (diabetes mellitus, type 1) (HCC)    type 1   High cholesterol    Hypertension    Neuromuscular disorder (HCC)    diabetic neuropathy   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   Past Surgical History:  Procedure Laterality Date   APPENDECTOMY   20's   BACK SURGERY     BRAIN SURGERY  10/30/04   brain shunt, [facility]   CATARACT EXTRACTION Bilateral    left eye prostheisi "shell"   CYST REMOVAL NECK  yrs ago   CYSTOSCOPY WITH INSERTION OF UROLIFT  april/march 2020   done in office   CYSTOSCOPY WITH INSERTION OF UROLIFT [name] 10/12/2019   Procedure: CYSTOSCOPY WITH INSERTION OF UROLIFT;  Surgeon: Malen Gauze, MD;  Location: [facility];  Service: Urology;  Laterality: [name];   EYE SURGERY     LUMBAR FUSION  1998 and 2000   l5 to s1 1998, then l4 and l5 in 2000   SPINE SURGERY     Christopher Conner, L4-5 & L5-S1   VENTRICULOPERITONEAL SHUNT   2006   Family Status  Relation Name Status   Mother  Alive   Father Christopher Marland, Sr. ( d.-[id number]) Deceased at age 44  No partnership data on file   Family History  Problem Relation Age of Onset   Hypertension Mother    Hyperlipidemia Mother    Diabetes Mother    Diabetes Father    Social History   Socioeconomic History   Marital status: Married    Spouse name: Not on file   Number of children: Not on file   Years of education: Not on file   Highest education level: Not on file  Occupational History   Occupation: Engineer, structural    Comment: Management consultant (employer)  Tobacco Use   Smoking status: Never   Smokeless tobacco: Never  Vaping Use   Vaping status: Never Used  Substance and Sexual Activity   Alcohol use: No   Drug use: No   Sexual activity: Not Currently  Other Topics Concern  Not on file  Social History Narrative   Not on file   Social Determinants of Health   Financial Resource Strain: Not on file  Food Insecurity: Not on file  Transportation Needs: Not on file  Physical Activity: Not on file  Stress: Not on file  Social Connections: Not on file   Outpatient Medications Prior to Visit  Medication Sig Note   Acetylcarnitine HCl (ACETYL L-CARNITINE) 250 MG CAPS 1 capsule    Alpha-Lipoic Acid 300 MG CAPS Take 1 capsule by mouth 2 (two) times daily.    amLODipine (NORVASC) 10 MG tablet Take 1 tablet by mouth daily.    atorvastatin (LIPITOR) 80 MG tablet Take 40 mg by mouth every evening.     B Complex-Biotin-FA (B-100 COMPLEX CR PO) 1 tablet    brompheniramine-pseudoephedrine-DM 30-2-10 MG/5ML syrup Take 5 mLs by mouth 3 (three) times daily as needed.    carvedilol (COREG) 3.125 MG tablet Take 3.125 mg by mouth 2 (two) times daily with a meal.    Coenzyme Q10 (COQ-10) 100 MG capsule See admin instructions.    CONTOUR NEXT TEST test strip 2 (two) times daily.    fluticasone (FLONASE) 50 MCG/ACT nasal spray Place 2 sprays into both nostrils  daily.    folic acid (FOLVITE) 1 MG tablet Take 1 mg by mouth daily.    Green Tea 150 MG CAPS See admin instructions.    meclizine (ANTIVERT) 25 MG tablet Take 1 tablet (25 mg total) by mouth 3 (three) times daily as needed for dizziness.    methotrexate (RHEUMATREX) 2.5 MG tablet TAKE 8 TABLETS BY MOUTH 1 TIME A WEEK AS DIRECTED    NOVOLOG 100 UNIT/ML injection INJECT UP TO 100 UNITS DAILY MAXIMUM DOSE WITH INSULIN PUMP DAILY    OMEGA 3-6-9 FATTY ACIDS PO Take 1 capsule by mouth 2 (two) times daily.    ondansetron (ZOFRAN ODT) 8 MG disintegrating tablet 8mg  ODT q6 hours prn nausea 10/19/2022: prn   OVER THE COUNTER MEDICATION Vitamin c 1000 mg daily    OVER THE COUNTER MEDICATION Vitamin d 3 2000 iu daily    OVER THE COUNTER MEDICATION probiotiotic qday    OVER THE COUNTER MEDICATION Methyl folate 1000 mg qd    OVER THE COUNTER MEDICATION Green tea extract 400 mg qd    OVER THE COUNTER MEDICATION Astragalis root 470 mg daily    OVER THE COUNTER MEDICATION Grape seed extract 200 mg qd    OVER THE COUNTER MEDICATION Acetyl l-caritine 500 gm qd    OVER THE COUNTER MEDICATION reservetrol 50 mg qd    OVER THE COUNTER MEDICATION coq10 120 mg qd    OVER THE COUNTER MEDICATION b complex 100 mg qd    telmisartan (MICARDIS) 40 MG tablet 1 tablet    [DISCONTINUED] Cholecalciferol (VITAMIN D) 50 MCG (2000 UT) CAPS 1 capsule    [DISCONTINUED] fesoterodine (TOVIAZ) 8 MG TB24 tablet Take 1 tablet (8 mg total) by mouth daily.    meloxicam (MOBIC) 15 MG tablet Take 15 mg by mouth daily.    Polyvinyl Alcohol-Povidone (REFRESH OP) Apply 1-2 drops to eye every 3 (three) hours as needed (dryness).    [DISCONTINUED] azithromycin (ZITHROMAX) 250 MG tablet Take 250 mg by mouth as directed.    [DISCONTINUED] doxycycline (VIBRA-TABS) 100 MG tablet Take 1 tablet (100 mg total) by mouth 2 (two) times daily.    [DISCONTINUED] ketoconazole (NIZORAL) 2 % cream Apply 1 fingertip amount to each foot daily. (Patient not  taking:  Reported on 10/19/2022)    [DISCONTINUED] neomycin-polymyxin-hydrocortisone (CORTISPORIN) OTIC solution Apply 2 drops to the ingrown toenail site twice daily. Cover with band-aid.    [DISCONTINUED] OVER THE COUNTER MEDICATION Omega 369 1 bid    [DISCONTINUED] OVER THE COUNTER MEDICATION Alpha lipoic acid 600mg  qd    [DISCONTINUED] OVER THE COUNTER MEDICATION Calcium 630 mg qd    [DISCONTINUED] tiZANidine (ZANAFLEX) 2 MG tablet Take 2 mg by mouth 3 (three) times daily as needed. (Patient not taking: Reported on 10/19/2022)    [DISCONTINUED] Tragacanth (ASTRAGALUS ROOT) POWD See admin instructions.    [DISCONTINUED] Vit C-Cholecalciferol-Rose Hip (VITAMIN C & D3/ROSE HIPS) 928 784 4854-20 MG-UNIT-MG CAPS See admin instructions.    No facility-administered medications prior to visit.   Allergies  Allergen Reactions   Lisinopril Cough   Latex Itching and Rash   Immunization History  Administered Date(s) Administered   Covid-19, Mrna,Vaccine(Spikevax)58yrs and older 03/20/2022, 08/07/2022   PNEUMOCOCCAL CONJUGATE-20 08/04/2022   Rsv, Bivalent, Protein Subunit Rsvpref,pf Verdis Frederickson) 08/21/2022   Tdap 09/08/2021   Zoster Recombinant(Shingrix) 05/29/2021, 09/08/2021    Health Maintenance Due Health Maintenance Topics with due status: Overdue     Topic Date Due   FOOT EXAM Never done   OPHTHALMOLOGY EXAM Never done   Diabetic kidney evaluation - Urine ACR Never done   HEMOGLOBIN A1C 10/10/2014   COVID-19 Vaccine 09/04/2022   Health Maintenance Topics with due status: Due On     Topic Date Due   INFLUENZA VACCINE 11/19/2022    Review of Systems All review of systems negative except what is listed in the HPI   Objective    BP (!) 132/58   Pulse 64   Resp 16   Ht 5' 8.75" (1.746 m)   Wt 170 lb 9.6 oz (77.4 kg)   SpO2 98% Comment: room air  BMI 25.38 kg/m  Physical Exam Vitals and nursing note reviewed.  Constitutional:      General: He is not in acute distress.     Appearance: Normal appearance. He is normal weight. He is not ill-appearing.  HENT:     Head: Normocephalic.     Nose: Nose normal.     Mouth/Throat:     Mouth: Mucous membranes are moist.     Comments: Posterior oropharynx reveals cobblestoning appearance without erythema.  Eyes:     General: No scleral icterus.    Conjunctiva/sclera: Conjunctivae normal.  Neck:     Vascular: No carotid bruit.  Cardiovascular:     Rate and Rhythm: Normal rate and regular rhythm.     Pulses: Normal pulses.     Heart sounds: Normal heart sounds.  Pulmonary:     Effort: Pulmonary effort is normal.     Breath sounds: Rhonchi present.     Comments: Significant rhonchi noted in the right upper and middle lobe.  Abdominal:     General: Bowel sounds are normal.     Palpations: Abdomen is soft.  Musculoskeletal:     Right lower leg: No edema.     Left lower leg: No edema.  Lymphadenopathy:     Cervical: No cervical adenopathy.  Skin:    General: Skin is warm and dry.     Capillary Refill: Capillary refill takes less than 2 seconds.  Neurological:     General: No focal deficit present.     Mental Status: He is alert.     Motor: Weakness present.     Gait: Gait abnormal.  Psychiatric:  Mood and Affect: Mood normal.     No results found for any visits on 10/19/22.  Assessment & Plan      Problem List Items Addressed This Visit     Diabetes (HCC)    The patient has well-controlled type 1 diabetes, managed with a continuous glucose monitor (CGM) and an insulin pump. His recent hemoglobin A1C levels are commendable, ranging from 5.9-5.8. Plan: - Continue current diabetes management regimen. - No changes indicated at this time.       Relevant Orders   Ambulatory referral to Podiatry   Hypertension    Chronic condition with slight elevation in systolic BP during visit today. Consider white coat elevation.  Plan: - Monitor BP at home and let me know if readings are higher than 130/80  on average.  - Continue amlodipine, telmisartan, and carvedilol      CLL (chronic lymphocytic leukemia) (HCC)    The patient has a history of CLL, currently stable with no active treatment required. His white blood cell count is elevated but stable, and he is monitored annually by his oncologist. Plan: - Continue annual monitoring with the oncologist. - No changes in management indicated at this time.      Urinary frequency    The patient suffers from frequent nighttime urination and uses Trazodone to manage sleep disturbances. He has tried various medications for urination without success. He is seeing urology.  Plan: - Follow up with urology regarding the management of nighttime urination and potential Botox treatment. - Continue using Trazodone as prescribed, adjusting the dose as needed for sleep.       Weight loss, abnormal - Primary    The patient reports significant unexplained weight loss and issues with fluid retention, suspecting a possible hormonal imbalance affecting water absorption and electrolyte levels. He experiences increased urination and has tried multiple medications without success. His sodium levels are notably low, raising concerns about potential syndrome of inappropriate antidiuretic hormone secretion (SIADH) or diabetes insipidus. Plan: - Scheduled appointment to further evaluate the condition and explore the possibility of hormonal imbalances such as SIADH. - Consider additional diagnostic tests such as plasma osmolality and a 24-hour urine collection to assess for diabetes insipidus if not already performed.      Relevant Orders   DG Chest 2 View (Completed)   Scattered rhonchi of right lung    The patient experiences chronic phlegm production and potential lung congestion post-COVID infection, with no current signs of infection such as fever or significant cough. On exam he does have rhonchi noted  Plan: - Recommend a chest x-ray to evaluate for underlying  pneumonia or other respiratory conditions that might explain the chronic respiratory symptoms and weight loss.      Relevant Orders   DG Chest 2 View (Completed)   Acquired bilateral foot drop    Chronic, related to DM1. No alarm symptoms are present at this time. I recommend close monitoring and consideration of PT if the symptoms seem to worsen.  Plan: - No changes needed at this time - Notify me if you feel this is worsening or changes. We can consider PT for further management, if desired.      Relevant Orders   Ambulatory referral to Podiatry   Hyponatremia    Recurrent hyponatremia noted on labs with increased urinary output reported. At this time etiology is unclear. Medication review does not reveal any medications that could be contributing. Blood sugar levels are well controlled, therefore diabetes is unlikely cause.  Review of recent labs does not reveal a cause, either. Kidney function appears stable. I do not have results of the 24 hour urine at this time, but will look for those. Consider possible insufficient ADH or vasopressin resistance/ diabetes insipidus.  Plan: - Review 24 hour urine results for volume, osmolality, etc.  - Will monitor plasma co-peptin level to determine baseline. - Further research into history and symptoms are needed at this time. Will review these factors and plan for labs in near future.        Normal pressure hydrocephalus (HCC)     Return TBD.      , Christopher Amabile, NP, DNP, AGNP-C [facility]

## 2022-10-20 ENCOUNTER — Ambulatory Visit
Admission: RE | Admit: 2022-10-20 | Discharge: 2022-10-20 | Disposition: A | Discharge status: Home / Self Care | Payer: Medicare Other | Source: Ambulatory Visit | Attending: Nurse Practitioner | Admitting: Nurse Practitioner

## 2022-10-20 DIAGNOSIS — R059 Cough, unspecified: Secondary | ICD-10-CM | POA: Diagnosis not present

## 2022-10-20 NOTE — Assessment & Plan Note (Signed)
Recurrent hyponatremia noted on labs with increased urinary output reported. At this time etiology is unclear. Medication review does not reveal any medications that could be contributing. Blood sugar levels are well controlled, therefore diabetes is unlikely cause. Review of recent labs does not reveal a cause, either. Kidney function appears stable. I do not have results of the 24 hour urine at this time, but will look for those. Consider possible insufficient ADH or vasopressin resistance/ diabetes insipidus.  Plan: - Review 24 hour urine results for volume, osmolality, etc.  - Will monitor plasma co-peptin level to determine baseline. - Further research into history and symptoms are needed at this time. Will review these factors and plan for labs in near future.

## 2022-10-23 ENCOUNTER — Encounter: Payer: Self-pay | Admitting: Nurse Practitioner

## 2022-10-28 ENCOUNTER — Ambulatory Visit (INDEPENDENT_AMBULATORY_CARE_PROVIDER_SITE_OTHER): Payer: Medicare Other | Admitting: Urology

## 2022-10-28 ENCOUNTER — Encounter: Payer: Self-pay | Admitting: Urology

## 2022-10-28 VITALS — BP 152/76 | HR 63

## 2022-10-28 DIAGNOSIS — R350 Frequency of micturition: Secondary | ICD-10-CM

## 2022-10-28 DIAGNOSIS — R351 Nocturia: Secondary | ICD-10-CM

## 2022-10-28 DIAGNOSIS — N401 Enlarged prostate with lower urinary tract symptoms: Secondary | ICD-10-CM

## 2022-10-28 NOTE — Patient Instructions (Signed)
Botulinum Toxin Bladder Injection  A botulinum toxin bladder injection is a procedure to treat an overactive bladder. During the procedure, a drug called botulinum toxin is injected into the bladder through a long, thin needle. This drug relaxes the bladder muscles and reduces overactivity. You may need this procedure if your medicines are not working or you cannot take them. The procedure may be repeated as needed. The treatment is done once and it usually lasts for 6 months. Your health care provider will monitor you to see how well you respond. Tell a health care provider about: Any allergies you have. All medicines you are taking, including vitamins, herbs, eye drops, creams, and over-the-counter medicines. Any problems you or family members have had with anesthetic medicines. Any bleeding problems you have. Any surgeries you have had. Any medical conditions you have. Any previous reactions to a botulinum toxin injection. Any symptoms of urinary tract infection. These include chills, fever, a burning feeling when passing urine, and needing to pass urine often. Whether you are pregnant or may be pregnant. What are the risks? Generally this is a safe procedure. However, problems may occur, including: Not being able to pass urine. If this happens, you may need to have your bladder emptied with a thin tube (urinary catheter). Bleeding. Urinary tract infection. Allergic reaction to the botulinum toxin. Pain or burning when passing urine. Damage to nearby structures or organs. What happens before the procedure? When to stop eating and drinking Follow instructions from your health care provider about what you may eat and drink before your procedure. These may include: 8 hours before the procedure Stop eating most foods. Do not eat meat, fried foods, or fatty foods. Eat only light foods, such as toast or crackers. All liquids are okay except energy drinks and alcohol. 6 hours before the  procedure Stop eating. Drink only clear liquids, such as water, clear fruit juice, black coffee, plain tea, and sports drinks. Do not drink energy drinks or alcohol. 2 hours before the procedure Stop drinking all liquids. You may be allowed to take medicines with small sips of water. If you do not follow your health care provider's instructions, your procedure may be delayed or canceled. Medicines Ask your health care provider about: Changing or stopping your regular medicines. This is especially important if you are taking diabetes medicines or blood thinners. Taking medicines such as aspirin and ibuprofen. These medicines can thin your blood. Do not take these medicines unless your health care provider tells you to take them. Taking over-the-counter medicines, vitamins, herbs, and supplements. General instructions Ask your health care provider what steps will be taken to help prevent infection. These steps may include: Removing hair at the procedure site. Washing skin with a germ-killing soap. Taking antibiotic medicine. If you will be going home right after the procedure, plan to have a responsible adult: Take you home from the hospital or clinic. You will not be allowed to drive. Care for you for the time you are told. What happens during the procedure?  You will be asked to empty your bladder. An IV will be inserted into one of your veins. You will be given one or more of the following: A medicine to help you relax (sedative). A medicine to numb the area (local anesthetic). A medicine to make you fall asleep (general anesthetic). A long, thin scope called a cystoscope will be passed into your bladder through the part of the body that carries urine from your bladder (urethra). The cystoscope  will be used to fill your bladder with water. A long needle will be passed through the cystoscope and into the bladder. The botulinum toxin will be injected into your bladder. It may be  injected into multiple areas of your bladder. The cystoscope will be removed and your bladder will be emptied with a urinary catheter. The procedure may vary among health care providers and hospitals. What can I expect after the procedure? After your procedure, it is common to have: Blood-tinged urine. Burning or soreness when you pass urine. Follow these instructions at home: Medicines Take over-the-counter and prescription medicines only as told by your health care provider. If you were prescribed an antibiotic medicine, take it as told by your health care provider. Do not stop using the antibiotic even if you start to feel better. General instructions  If you were given a sedative during the procedure, it can affect you for several hours. Do not drive or operate machinery until your health care provider says that it is safe. Drink enough fluid to keep your urine pale yellow. Return to your normal activities as told by your health care provider. Ask your health care provider what activities are safe for you. Keep all follow-up visits. Contact a health care provider if you have: A fever or chills. Blood-tinged urine for more than one day after your procedure. Worsening pain or burning when you pass urine. Pain or burning when passing urine for more than two days after your procedure. Trouble emptying your bladder. Get help right away if you: Have bright red blood in your urine. Are unable to pass urine. Summary A botulinum toxin bladder injection is a procedure to treat an overactive bladder. This is generally a safe procedure. However, problems may occur, including not being able to pass urine, bleeding, infection, pain, and an allergic reaction to the botulinum toxin. You will be told when to stop eating and drinking, and what medicines to change or stop. Follow instructions carefully. After the procedure, it is common to have blood in your urine and to have soreness or burning when  passing urine. Contact a health care provider if you have a fever, blood in your urine for more than a few days, or trouble passing urine. Get help right away if you have bright red blood in your urine, or if you are unable to pass urine. This information is not intended to replace advice given to you by your health care provider. Make sure you discuss any questions you have with your health care provider. Document Revised: 10/11/2020 Document Reviewed: 10/11/2020 Elsevier Patient Education  2024 [employer].

## 2022-10-28 NOTE — Progress Notes (Signed)
10/28/2022 1:34 PM   Christopher Conner 05/10/1950 [id number]  Referring provider: Georgianne Fick, MD [address] [place],  [place] [address]  Followup OAB   HPI: [name] is a 72yo here for followup for OAB. He is currently on toviaz 8mg  daily. He continue to have bothersome urinary frequency and urgency. He has nocturia 3-5x. He has failed gemtesa, mirabegron.   PMH: Past Medical History:  Diagnosis Date   Arthritis    BPH (benign prostatic hyperplasia)    Cataract    Chronic lymphocytic leukemia (HCC) early 2018   no current treatment   CLL (chronic lymphocytic leukemia) (HCC)    DM type 1 (diabetes mellitus, type 1) (HCC)    type 1   High cholesterol    Hypertension    Neuromuscular disorder (HCC)    diabetic neuropathy   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    Surgical History: Past Surgical History:  Procedure Laterality Date   APPENDECTOMY  early 20's   BACK SURGERY     BRAIN SURGERY  10/30/04   brain shunt, [facility]   CATARACT EXTRACTION Bilateral    left eye prostheisi "shell"   CYST REMOVAL NECK  yrs ago   CYSTOSCOPY WITH INSERTION OF UROLIFT  april/march 2020   done in office   CYSTOSCOPY WITH INSERTION OF UROLIFT [name] 10/12/2019   Procedure: CYSTOSCOPY WITH INSERTION OF UROLIFT;  Surgeon: Malen Gauze, MD;  Location: [facility];  Service: Urology;  Laterality: [name];   EYE SURGERY     LUMBAR FUSION  1998 and 2000   l5 to s1 1998, then l4 and l5 in 2000   SPINE SURGERY     [facility]. [facility], L4-5 & L5-S1   VENTRICULOPERITONEAL SHUNT  2006    Home Medications:  Allergies as of 10/28/2022       Reactions   Lisinopril Cough   Latex Itching, Rash        Medication List        Accurate as of October 28, 2022  1:34 PM. If you have any questions, ask your nurse or doctor.          Acetyl L-Carnitine 250 MG Caps 1 capsule   Alpha-Lipoic Acid 300 MG Caps Take 1 capsule by mouth  2 (two) times daily.   amLODipine 10 MG tablet Commonly known as: NORVASC Take 1 tablet by mouth daily.   atorvastatin 80 MG tablet Commonly known as: LIPITOR Take 40 mg by mouth every evening.   B-100 COMPLEX CR PO 1 tablet   brompheniramine-pseudoephedrine-DM 30-2-10 MG/5ML syrup Take 5 mLs by mouth 3 (three) times daily as needed.   carvedilol 3.125 MG tablet Commonly known as: COREG Take 3.125 mg by mouth 2 (two) times daily with a meal.   Contour Next Test test strip Generic drug: glucose blood 2 (two) times daily.   CoQ-10 100 MG capsule See admin instructions.   fesoterodine 8 MG Tb24 tablet Commonly known as: TOVIAZ Take 1 tablet (8 mg total) by mouth daily.   fluticasone 50 MCG/ACT nasal spray Commonly known as: FLONASE Place 2 sprays into both nostrils daily.   folic acid 1 MG tablet Commonly known as: FOLVITE Take 1 mg by mouth daily.   Green Tea 150 MG Caps See admin instructions.   meclizine 25 MG tablet Commonly known as: ANTIVERT Take 1 tablet (25 mg total) by mouth 3 (three) times daily as needed for dizziness.  meloxicam 15 MG tablet Commonly known as: MOBIC Take 15 mg by mouth daily.   methotrexate 2.5 MG tablet Commonly known as: RHEUMATREX TAKE 8 TABLETS BY MOUTH 1 TIME A WEEK AS DIRECTED   NovoLOG 100 UNIT/ML injection Generic drug: insulin aspart INJECT UP TO 100 UNITS DAILY MAXIMUM DOSE WITH INSULIN PUMP DAILY   OMEGA 3-6-9 FATTY ACIDS PO Take 1 capsule by mouth 2 (two) times daily.   ondansetron 8 MG disintegrating tablet Commonly known as: Zofran ODT 8mg  ODT q6 hours prn nausea   OVER THE COUNTER MEDICATION Vitamin c 1000 mg daily   OVER THE COUNTER MEDICATION Vitamin d 3 2000 iu daily   OVER THE COUNTER MEDICATION probiotiotic qday   OVER THE COUNTER MEDICATION Methyl folate 1000 mg qd   OVER THE COUNTER MEDICATION Green tea extract 400 mg qd   OVER THE COUNTER MEDICATION Astragalis root 470 mg daily   OVER  THE COUNTER MEDICATION Grape seed extract 200 mg qd   OVER THE COUNTER MEDICATION Acetyl l-caritine 500 gm qd   OVER THE COUNTER MEDICATION reservetrol 50 mg qd   OVER THE COUNTER MEDICATION coq10 120 mg qd   OVER THE COUNTER MEDICATION b complex 100 mg qd   REFRESH OP Apply 1-2 drops to eye every 3 (three) hours as needed (dryness).   telmisartan 40 MG tablet Commonly known as: MICARDIS 1 tablet   Vitamin D 50 MCG (2000 UT) Caps 1 capsule        Allergies:  Allergies  Allergen Reactions   Lisinopril Cough   Latex Itching and Rash    Family History: Family History  Problem Relation Age of Onset   Hypertension Mother    Hyperlipidemia Mother    Diabetes Mother    Diabetes Father     Social History:  reports that he has never smoked. He has never used smokeless tobacco. He reports that he does not drink alcohol and does not use drugs.  ROS: All other review of systems were reviewed and are negative except what is noted above in HPI  Physical Exam: BP (!) 152/76   Pulse 63   Constitutional:  Alert and oriented, No acute distress. HEENT: [place] AT, moist mucus membranes.  Trachea midline, no masses. Cardiovascular: No clubbing, cyanosis, or edema. Respiratory: Normal respiratory effort, no increased work of breathing. GI: Abdomen is soft, nontender, nondistended, no abdominal masses GU: No CVA tenderness.  Lymph: No cervical or inguinal lymphadenopathy. Skin: No rashes, bruises or suspicious lesions. Neurologic: Grossly intact, no focal deficits, moving all 4 extremities. Psychiatric: Normal mood and affect.  Laboratory Data: Lab Results  Component Value Date   WBC 15.6 (H) 06/12/2022   HGB 11.9 (L) 06/12/2022   HCT 35.1 (L) 06/12/2022   MCV 102.0 (H) 06/12/2022   PLT 247 06/12/2022    Lab Results  Component Value Date   CREATININE 0.95 07/28/2022    No results found for: "PSA"  No results found for: "TESTOSTERONE"  Lab Results  Component  Value Date   HGBA1C 6.7 (H) 04/10/2014    Urinalysis    Component Value Date/Time   COLORURINE YELLOW 08/16/2015 0244   APPEARANCEUR Clear 07/15/2022 1523   LABSPEC 1.029 08/16/2015 0244   PHURINE 5.5 08/16/2015 0244   GLUCOSEU Negative 07/15/2022 1523   HGBUR NEGATIVE 08/16/2015 0244   BILIRUBINUR Negative 07/15/2022 1523   KETONESUR >80 (A) 08/16/2015 0244   PROTEINUR Negative 07/15/2022 1523   PROTEINUR NEGATIVE 08/16/2015 0244   UROBILINOGEN 0.2 04/09/2014  1947   NITRITE Negative 07/15/2022 1523   NITRITE NEGATIVE 08/16/2015 0244   LEUKOCYTESUR Negative 07/15/2022 1523    Lab Results  Component Value Date   LABMICR Comment 07/15/2022   BACTERIA RARE (A) 08/16/2015    Pertinent Imaging:  Results for orders placed during the hospital encounter of 05/23/10  DG Abd 1 View  Narrative *RADIOLOGY REPORT*  Clinical Data: Weakness with nausea and vomiting.  ABDOMEN - 1 VIEW  Comparison: 02/10/2010.  Findings: Ventriculoperitoneal shunt tubing extends into the right mid abdomen.  There is no shunt discontinuity.  Moderate stool is noted throughout the colon.  The bowel gas pattern is nonobstructive.  There are postsurgical changes of the lower lumbar spine status post fusion.  IMPRESSION:  1.  Moderate stool throughout the colon suggesting constipation. 2.  Intact ventricular peritoneal shunt.  Original Report Authenticated By: Gerrianne Scale, M.D.  No results found for this or any previous visit.  No results found for this or any previous visit.  No results found for this or any previous visit.  No results found for this or any previous visit.  No valid procedures specified. No results found for this or any previous visit.  No results found for this or any previous visit.   Assessment & Plan:    1. Urinary frequency -We discussed PTNS, intravesical botox, and interstim/axonics. After discussing the options the patient elects for intravesical  botox.  - Urinalysis, Routine w reflex microscopic  2. Nocturia The patient elects for intravesical botox   No follow-ups on file.  Wilkie Aye, MD  [facility] [place]

## 2022-11-02 ENCOUNTER — Telehealth: Payer: Self-pay

## 2022-11-02 ENCOUNTER — Other Ambulatory Visit: Payer: Self-pay

## 2022-11-02 MED ORDER — LEVOCETIRIZINE DIHYDROCHLORIDE 5 MG PO TABS: 5.0000 mg | ORAL_TABLET | Freq: Every evening | ORAL | 1 refills | Status: DC

## 2022-11-02 NOTE — Telephone Encounter (Signed)
Patient called requesting apt for Botox procedure.  Can you please create a posting sheet for this procedure.  Thanks!

## 2022-11-02 NOTE — Telephone Encounter (Signed)
Patient also states his symptoms have worsened since his last visit with you.  He is aware your next available surgery date is 08/22-08/29.  Do you have any recommendations until botox procedure?

## 2022-11-03 NOTE — Telephone Encounter (Signed)
Patient scheduled with Maralyn Sago on 07/18 to drop off a urine per MD.

## 2022-11-03 NOTE — Telephone Encounter (Signed)
I spoke with Aleatha Borer. We have discussed possible surgery dates and 12/10/2022 was agreed upon by all parties. Patient given information about surgery date, what to expect pre-operatively and post operatively.    We discussed that a pre-op nurse will be calling to set up the pre-op visit that will take place prior to surgery. Informed patient that our office will communicate any additional care to be provided after surgery.    Patients questions or concerns were discussed during our call. Advised to call our office should there be any additional information, questions or concerns that arise. Patient verbalized understanding.

## 2022-11-04 ENCOUNTER — Ambulatory Visit (INDEPENDENT_AMBULATORY_CARE_PROVIDER_SITE_OTHER): Payer: Medicare Other | Admitting: Podiatry

## 2022-11-04 DIAGNOSIS — M21371 Foot drop, right foot: Secondary | ICD-10-CM

## 2022-11-04 DIAGNOSIS — M21372 Foot drop, left foot: Secondary | ICD-10-CM | POA: Diagnosis not present

## 2022-11-04 DIAGNOSIS — R2689 Other abnormalities of gait and mobility: Secondary | ICD-10-CM | POA: Diagnosis not present

## 2022-11-04 DIAGNOSIS — Z9181 History of falling: Secondary | ICD-10-CM

## 2022-11-04 NOTE — Progress Notes (Signed)
Name: Christopher Conner DOB: [birth year] MRN: [id number]  History of Present Illness: Mr. Hondros is a 72 y.o. male who presents today for follow up visit at [facility] [place]. - GU history: 1. BPH with BOO.  - 10/12/2019: Underwent Urolift procedure by Dr. Ronne Binning. 2. OAB with urinary frequency, urgency, and urge incontinence. - Previously took Toviaz 8 mg daily.  - Failed Gemtesa and Myrbetriq.   At last visit with Dr. Ronne Binning on 10/28/2022: - Reported persistently bothersome urinary urgency, frequency, and nocturia (3-5x).  - Toviaz discontinued. - The plan was to proceed with bladder Botox.   Today: He reports that after discontinuing Gala Murdoch he has had increased nocturia, urgency, and some urge incontinence. He reports decreased dry mouth. He reports intermittent urinary stream;  he denies straining to void.   He reports routine caffeine intake (1-2 cups of coffee per day; some decaf tea).   He reports concerns about dehydration.   Fall Screening: Do you usually have a device to assist in your mobility? No   Medications: Current Outpatient Medications  Medication Sig Dispense Refill   Acetylcarnitine HCl (ACETYL L-CARNITINE) 250 MG CAPS 1 capsule     Alpha-Lipoic Acid 300 MG CAPS Take 1 capsule by mouth 2 (two) times daily.     amLODipine (NORVASC) 10 MG tablet Take 1 tablet by mouth daily.     atorvastatin (LIPITOR) 80 MG tablet Take 40 mg by mouth every evening.      B Complex-Biotin-FA (B-100 COMPLEX CR PO) 1 tablet     brompheniramine-pseudoephedrine-DM 30-2-10 MG/5ML syrup Take 5 mLs by mouth 3 (three) times daily as needed.     carvedilol (COREG) 3.125 MG tablet Take 3.125 mg by mouth 2 (two) times daily with a meal.     Coenzyme Q10 (COQ-10) 100 MG capsule See admin instructions.     CONTOUR NEXT TEST test strip 2 (two) times daily.     fluticasone (FLONASE) 50 MCG/ACT nasal spray Place 2 sprays into both nostrils daily.     folic acid (FOLVITE) 1 MG  tablet Take 1 mg by mouth daily.     Green Tea 150 MG CAPS See admin instructions.     levocetirizine (XYZAL) 5 MG tablet Take 1 tablet (5 mg total) by mouth every evening. 90 tablet 1   meclizine (ANTIVERT) 25 MG tablet Take 1 tablet (25 mg total) by mouth 3 (three) times daily as needed for dizziness. 30 tablet 0   meloxicam (MOBIC) 15 MG tablet Take 15 mg by mouth daily.     methotrexate (RHEUMATREX) 2.5 MG tablet TAKE 8 TABLETS BY MOUTH 1 TIME A WEEK AS DIRECTED     NOVOLOG 100 UNIT/ML injection INJECT UP TO 100 UNITS DAILY MAXIMUM DOSE WITH INSULIN PUMP DAILY  1   OMEGA 3-6-9 FATTY ACIDS PO Take 1 capsule by mouth 2 (two) times daily.     ondansetron (ZOFRAN ODT) 8 MG disintegrating tablet 8mg  ODT q6 hours prn nausea 8 tablet 0   OVER THE COUNTER MEDICATION Vitamin c 1000 mg daily     OVER THE COUNTER MEDICATION Vitamin d 3 2000 iu daily     OVER THE COUNTER MEDICATION probiotiotic qday     OVER THE COUNTER MEDICATION Methyl folate 1000 mg qd     OVER THE COUNTER MEDICATION Green tea extract 400 mg qd     OVER THE COUNTER MEDICATION Astragalis root 470 mg daily     OVER THE COUNTER MEDICATION Grape seed extract 200  mg qd     OVER THE COUNTER MEDICATION Acetyl l-caritine 500 gm qd     OVER THE COUNTER MEDICATION reservetrol 50 mg qd     OVER THE COUNTER MEDICATION coq10 120 mg qd     OVER THE COUNTER MEDICATION b complex 100 mg qd     Polyvinyl Alcohol-Povidone (REFRESH OP) Apply 1-2 drops to eye every 3 (three) hours as needed (dryness).     telmisartan (MICARDIS) 40 MG tablet 1 tablet     No current facility-administered medications for this visit.    Allergies: Allergies  Allergen Reactions   Lisinopril Cough   Latex Itching and Rash    Past Medical History:  Diagnosis Date   Arthritis    BPH (benign prostatic hyperplasia)    Cataract    Chronic lymphocytic leukemia (HCC) early 2018   no current treatment   CLL (chronic lymphocytic leukemia) (HCC)    DM type 1  (diabetes mellitus, type 1) (HCC)    type 1   High cholesterol    Hypertension    Neuromuscular disorder (HCC)    diabetic neuropathy   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   Past Surgical History:  Procedure Laterality Date   APPENDECTOMY  early 20's   BACK SURGERY     BRAIN SURGERY  10/30/04   brain shunt, Codman Hakim   CATARACT EXTRACTION Bilateral    left eye prostheisi "shell"   CYST REMOVAL NECK  yrs ago   CYSTOSCOPY WITH INSERTION OF UROLIFT  april/march 2020   done in office   CYSTOSCOPY WITH INSERTION OF UROLIFT [name] 10/12/2019   Procedure: CYSTOSCOPY WITH INSERTION OF UROLIFT;  Surgeon: Malen Gauze, MD;  Location: [facility];  Service: Urology;  Laterality: [name];   EYE SURGERY     LUMBAR FUSION  1998 and 2000   l5 to s1 1998, then l4 and l5 in 2000   SPINE SURGERY     H. Elsner, L4-5 & L5-S1   VENTRICULOPERITONEAL SHUNT  2006   Family History  Problem Relation Age of Onset   Hypertension Mother    Hyperlipidemia Mother    Diabetes Mother    Diabetes Father    Social History   Socioeconomic History   Marital status: Married    Spouse name: Not on file   Number of children: Not on file   Years of education: Not on file   Highest education level: Not on file  Occupational History   Occupation: Engineer, structural    Comment: Management consultant (employer)  Tobacco Use   Smoking status: Never   Smokeless tobacco: Never  Vaping Use   Vaping status: Never Used  Substance and Sexual Activity   Alcohol use: No   Drug use: No   Sexual activity: Not Currently  Other Topics Concern   Not on file  Social History Narrative   Not on file   Social Determinants of Health   Financial Resource Strain: Not on file  Food Insecurity: Not on file  Transportation Needs: Not on file  Physical Activity: Not on file  Stress: Not on file  Social Connections: Not on file  Intimate Partner Violence: Not on file     Review of Systems Constitutional: Patient denies any unintentional weight loss or change in strength lntegumentary: Patient denies any rashes or pruritus Eyes: Patient denies dry eyes ENT: Patient denies dry mouth Cardiovascular: Patient denies chest pain or syncope Respiratory: Patient denies shortness of  breath Gastrointestinal: Patient denies nausea, vomiting, constipation, or diarrhea Musculoskeletal: Patient denies muscle cramps or weakness Neurologic: Patient denies convulsions or seizures Psychiatric: Patient denies memory problems Allergic/Immunologic: Patient denies recent allergic reaction(s) Hematologic/Lymphatic: Patient denies bleeding tendencies Endocrine: Patient denies heat/cold intolerance  GU: As per HPI.  OBJECTIVE Vitals:   11/05/22 1316  BP: (!) 161/73  Pulse: (!) 59  Temp: 97.6 F (36.4 C)   There is no height or weight on file to calculate BMI.  Physical Examination  Constitutional: No obvious distress; patient is non-toxic appearing  Cardiovascular: No visible lower extremity edema.  Respiratory: The patient does not have audible wheezing/stridor; respirations do not appear labored  Gastrointestinal: Abdomen non-distended Musculoskeletal: Normal ROM of UEs  Skin: No obvious rashes/open sores  Neurologic: CN 2-12 grossly intact Psychiatric: Answered questions appropriately with normal affect  Hematologic/Lymphatic/Immunologic: No obvious bruises or sites of spontaneous bleeding  UA: negative PVR: 42 ml  ASSESSMENT Urinary frequency - Plan: Urinalysis, Routine w reflex microscopic, BLADDER SCAN AMB NON-IMAGING, Urine Culture  Benign localized prostatic hyperplasia with lower urinary tract symptoms (LUTS) - Plan: Urinalysis, Routine w reflex microscopic, BLADDER SCAN AMB NON-IMAGING, Urine Culture  Nocturia - Plan: Urinalysis, Routine w reflex microscopic, BLADDER SCAN AMB NON-IMAGING, Urine Culture  Urinary urgency - Plan: Urinalysis,  Routine w reflex microscopic, BLADDER SCAN AMB NON-IMAGING, Urine Culture  We discussed the symptoms of overactive bladder (OAB), which include urinary urgency, frequency, nocturia, with or without urge incontinence. Likely a result of prior BPH with BOO. - Symptoms have increased since discontinuing Toviaz, which is not unexpected.  - We discussed the following management options in detail including potential benefits, risks, and side effects: Behavioral therapy: Decreasing bladder irritants (such as caffeine, acidic foods, spicy foods, alcohol) Urge suppression strategies Bladder retraining / timed voiding Medication(s): Anticholinergic medications: He declined to restart Toviaz. Beta-3 adrenergic agonist medications: Failed  For refractory cases: Bladder Botox - he states this is currently scheduled for 12/10/2022 with Dr. Ronne Binning; he would like an earlier date if possible.  No evidence of UTI. Normal PVR.  He was advised that per review of recent renal labs and today's urine specific gravity there is no evidence of dehydration.   Pt verbalized understanding and agreement. All questions were answered.   PLAN Advised the following: 1. Avoid caffeine intake. 2. Work on timed voiding. 3. Proceed with bladder Botox procedure as planned. Will check to see if earlier date available.   Orders Placed This Encounter  Procedures   Urine Culture   Urinalysis, Routine w reflex microscopic   BLADDER SCAN AMB NON-IMAGING   It has been explained that the patient is to follow regularly with their PCP in addition to all other providers involved in their care and to follow instructions provided by these respective offices. Patient advised to contact urology clinic if any urologic-pertaining questions, concerns, new symptoms or problems arise in the interim period.  There are no Patient Instructions on file for this visit.  Electronically signed by:  Donnita Falls, FNP   11/05/22    2:14 PM

## 2022-11-04 NOTE — Progress Notes (Signed)
Subjective:  Patient ID: Christopher Conner, male    DOB: Mar 28, 1951,  MRN: [id number]  Chief Complaint  Patient presents with   Foot Pain    Wants to talk about options for his drop foot     72 y.o. male presents with the above complaint.  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  Patient states that he wanted to discuss treatment options for his dropfoot if any.  He is a diabetic with history of CLL.  He states been dealing with this for quite some time has been wearing the dropfoot bracing but is still causing him to trip because his foot is not clearing the ground.  He denies any other acute complaints.  He denies any other issues.   Review of Systems: Negative except as noted in the HPI. Denies N/V/F/Ch.  Past Medical History:  Diagnosis Date   Arthritis    BPH (benign prostatic hyperplasia)    Cataract    Chronic lymphocytic leukemia (HCC) early 2018   no current treatment   CLL (chronic lymphocytic leukemia) (HCC)    DM type 1 (diabetes mellitus, type 1) (HCC)    type 1   High cholesterol    Hypertension    Neuromuscular disorder (HCC)    diabetic neuropathy   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    Current Outpatient Medications:    Acetylcarnitine HCl (ACETYL L-CARNITINE) 250 MG CAPS, 1 capsule, Disp: , Rfl:    Alpha-Lipoic Acid 300 MG CAPS, Take 1 capsule by mouth 2 (two) times daily., Disp: , Rfl:    amLODipine (NORVASC) 10 MG tablet, Take 1 tablet by mouth daily., Disp: , Rfl:    atorvastatin (LIPITOR) 80 MG tablet, Take 40 mg by mouth every evening. , Disp: , Rfl:    B Complex-Biotin-FA (B-100 COMPLEX CR PO), 1 tablet, Disp: , Rfl:    brompheniramine-pseudoephedrine-DM 30-2-10 MG/5ML syrup, Take 5 mLs by mouth 3 (three) times daily as needed., Disp: , Rfl:    carvedilol (COREG) 3.125 MG tablet, Take 3.125 mg by mouth 2 (two) times daily with a meal., Disp: , Rfl:    Coenzyme Q10 (COQ-10)  100 MG capsule, See admin instructions., Disp: , Rfl:    CONTOUR NEXT TEST test strip, 2 (two) times daily., Disp: , Rfl:    fluticasone (FLONASE) 50 MCG/ACT nasal spray, Place 2 sprays into both nostrils daily., Disp: , Rfl:    folic acid (FOLVITE) 1 MG tablet, Take 1 mg by mouth daily., Disp: , Rfl:    Green Tea 150 MG CAPS, See admin instructions., Disp: , Rfl:    levocetirizine (XYZAL) 5 MG tablet, Take 1 tablet (5 mg total) by mouth every evening., Disp: 90 tablet, Rfl: 1   meclizine (ANTIVERT) 25 MG tablet, Take 1 tablet (25 mg total) by mouth 3 (three) times daily as needed for dizziness., Disp: 30 tablet, Rfl: 0   meloxicam (MOBIC) 15 MG tablet, Take 15 mg by mouth daily., Disp: , Rfl:    methotrexate (RHEUMATREX) 2.5 MG tablet, TAKE 8 TABLETS BY MOUTH 1 TIME A WEEK AS DIRECTED, Disp: , Rfl:    NOVOLOG 100 UNIT/ML injection, INJECT UP TO 100 UNITS DAILY MAXIMUM DOSE WITH INSULIN PUMP DAILY, Disp: , Rfl: 1   OMEGA 3-6-9 FATTY ACIDS PO, Take 1 capsule by mouth 2 (two) times daily., Disp: , Rfl:    ondansetron (ZOFRAN ODT) 8 MG disintegrating tablet, 8mg  ODT q6 hours  prn nausea, Disp: 8 tablet, Rfl: 0   OVER THE COUNTER MEDICATION, Vitamin c 1000 mg daily, Disp: , Rfl:    OVER THE COUNTER MEDICATION, Vitamin d 3 2000 iu daily, Disp: , Rfl:    OVER THE COUNTER MEDICATION, probiotiotic qday, Disp: , Rfl:    OVER THE COUNTER MEDICATION, Methyl folate 1000 mg qd, Disp: , Rfl:    OVER THE COUNTER MEDICATION, Green tea extract 400 mg qd, Disp: , Rfl:    OVER THE COUNTER MEDICATION, Astragalis root 470 mg daily, Disp: , Rfl:    OVER THE COUNTER MEDICATION, Grape seed extract 200 mg qd, Disp: , Rfl:    OVER THE COUNTER MEDICATION, Acetyl l-caritine 500 gm qd, Disp: , Rfl:    OVER THE COUNTER MEDICATION, reservetrol 50 mg qd, Disp: , Rfl:    OVER THE COUNTER MEDICATION, coq10 120 mg qd, Disp: , Rfl:    OVER THE COUNTER MEDICATION, b complex 100 mg qd, Disp: , Rfl:    Polyvinyl Alcohol-Povidone  (REFRESH OP), Apply 1-2 drops to eye every 3 (three) hours as needed (dryness)., Disp: , Rfl:    telmisartan (MICARDIS) 40 MG tablet, 1 tablet, Disp: , Rfl:   Social History   Tobacco Use  Smoking Status Never  Smokeless Tobacco Never    Allergies  Allergen Reactions   Lisinopril Cough   Latex Itching and Rash   Objective:  There were no vitals filed for this visit. There is no height or weight on file to calculate BMI. Constitutional Well developed. Well nourished.  Vascular Dorsalis pedis pulses palpable bilaterally. Posterior tibial pulses palpable bilaterally. Capillary refill normal to all digits.  No cyanosis or clubbing noted. Pedal hair growth normal.  Neurologic Normal speech. Oriented to person, place, and time. Epicritic sensation to light touch grossly present bilaterally.  Dermatologic Nails well groomed and normal in appearance. No open wounds. No skin lesions.  Orthopedic: Bilateral foot drop noted with weakness of the extensor tendon flexor tendons are 5 out of 5 bilaterally.  Bilateral dropfoot noted.   Radiographs: None Assessment:   1. Bilateral foot-drop   2. Antalgic gait   3. History of fall    Plan:  Patient was evaluated and treated and all questions answered.  Bilateral foot drop likely from back surgery with history of multiple fall -All of the concerns were discussed with the patient in extensive detail -Given the amount of dropfoot that is present he will build he will benefit from possibly more than balance bracing as he is also having a history of fall and antalgic gait due to it.  Dropfoot bracing/AFO bracing's are not giving him enough stability. -I believe she will benefit from seeing the orthotics department for bracing to help with the balance and prevent falls such as Moore balance bracing. -At this time I discussed with the patient that he is not an ideal surgical candidate as there are not a good alternative procedures for rigid  dropfoot.  I discussed with patient he states understanding would like to hold off on surgical procedure

## 2022-11-05 ENCOUNTER — Ambulatory Visit (INDEPENDENT_AMBULATORY_CARE_PROVIDER_SITE_OTHER): Payer: Medicare Other | Admitting: Urology

## 2022-11-05 ENCOUNTER — Encounter: Payer: Self-pay | Admitting: Urology

## 2022-11-05 VITALS — BP 161/73 | HR 59 | Temp 97.6°F

## 2022-11-05 DIAGNOSIS — R3915 Urgency of urination: Secondary | ICD-10-CM | POA: Diagnosis not present

## 2022-11-05 DIAGNOSIS — R350 Frequency of micturition: Secondary | ICD-10-CM | POA: Diagnosis not present

## 2022-11-05 DIAGNOSIS — R351 Nocturia: Secondary | ICD-10-CM | POA: Diagnosis not present

## 2022-11-05 DIAGNOSIS — N401 Enlarged prostate with lower urinary tract symptoms: Secondary | ICD-10-CM

## 2022-11-05 LAB — URINALYSIS, ROUTINE W REFLEX MICROSCOPIC
Bilirubin, UA: NEGATIVE
Glucose, UA: NEGATIVE
Leukocytes,UA: NEGATIVE
Nitrite, UA: NEGATIVE
Protein,UA: NEGATIVE
RBC, UA: NEGATIVE
Specific Gravity, UA: 1.01 (ref 1.005–1.030)
Urobilinogen, Ur: 0.2 mg/dL (ref 0.2–1.0)
pH, UA: 6 (ref 5.0–7.5)

## 2022-11-05 LAB — BLADDER SCAN AMB NON-IMAGING: Scan Result: 42

## 2022-11-07 LAB — URINE CULTURE: Organism ID, Bacteria: NO GROWTH

## 2022-11-09 NOTE — Telephone Encounter (Signed)
Ok I will schedule for 08/19, I will be in touch later this week with more details.  Have a great night

## 2022-11-09 NOTE — Progress Notes (Signed)
Please notify patient: Negative urine culture, no antibiotic needed at this time. Thanks.

## 2022-11-09 NOTE — Telephone Encounter (Signed)
Please add to surgery workque

## 2022-11-10 ENCOUNTER — Other Ambulatory Visit: Payer: Self-pay | Admitting: Urology

## 2022-11-10 DIAGNOSIS — E871 Hypo-osmolality and hyponatremia: Secondary | ICD-10-CM | POA: Diagnosis not present

## 2022-11-17 ENCOUNTER — Other Ambulatory Visit: Payer: Self-pay | Admitting: Urology

## 2022-11-17 DIAGNOSIS — R3915 Urgency of urination: Secondary | ICD-10-CM

## 2022-11-22 DIAGNOSIS — R0989 Other specified symptoms and signs involving the circulatory and respiratory systems: Secondary | ICD-10-CM

## 2022-11-22 DIAGNOSIS — M21371 Foot drop, right foot: Secondary | ICD-10-CM | POA: Insufficient documentation

## 2022-11-22 DIAGNOSIS — R634 Abnormal weight loss: Secondary | ICD-10-CM | POA: Insufficient documentation

## 2022-11-22 HISTORY — DX: Other specified symptoms and signs involving the circulatory and respiratory systems: R09.89

## 2022-11-22 NOTE — Assessment & Plan Note (Signed)
Chronic condition with slight elevation in systolic BP during visit today. Consider white coat elevation.  Plan: - Monitor BP at home and let me know if readings are higher than 130/80 on average.  - Continue amlodipine, telmisartan, and carvedilol

## 2022-11-22 NOTE — Assessment & Plan Note (Signed)
>>  ASSESSMENT AND PLAN FOR BENIGN PROSTATIC HYPERPLASIA WITH URINARY FREQUENCY WRITTEN ON 11/22/2022  7:51 PM BY Kathlen Sakurai E, NP  The patient suffers from frequent nighttime urination and uses Trazodone to manage sleep disturbances. He has tried various medications for urination without success. He is seeing urology.  Plan: - Follow up with urology regarding the management of nighttime urination and potential Botox treatment. - Continue using Trazodone as prescribed, adjusting the dose as needed for sleep.

## 2022-11-22 NOTE — Assessment & Plan Note (Signed)
The patient has a history of CLL, currently stable with no active treatment required. His white blood cell count is elevated but stable, and he is monitored annually by his oncologist. Plan: - Continue annual monitoring with the oncologist. - No changes in management indicated at this time.

## 2022-11-22 NOTE — Assessment & Plan Note (Signed)
The patient experiences chronic phlegm production and potential lung congestion post-COVID infection, with no current signs of infection such as fever or significant cough. On exam he does have rhonchi noted  Plan: - Recommend a chest x-ray to evaluate for underlying pneumonia or other respiratory conditions that might explain the chronic respiratory symptoms and weight loss.

## 2022-11-22 NOTE — Assessment & Plan Note (Signed)
The patient suffers from frequent nighttime urination and uses Trazodone to manage sleep disturbances. He has tried various medications for urination without success. He is seeing urology.  Plan: - Follow up with urology regarding the management of nighttime urination and potential Botox treatment. - Continue using Trazodone as prescribed, adjusting the dose as needed for sleep.

## 2022-11-22 NOTE — Assessment & Plan Note (Signed)
Chronic, related to DM1. No alarm symptoms are present at this time. I recommend close monitoring and consideration of PT if the symptoms seem to worsen.  Plan: - No changes needed at this time - Notify me if you feel this is worsening or changes. We can consider PT for further management, if desired.

## 2022-11-22 NOTE — Assessment & Plan Note (Signed)
The patient reports significant unexplained weight loss and issues with fluid retention, suspecting a possible hormonal imbalance affecting water absorption and electrolyte levels. He experiences increased urination and has tried multiple medications without success. His sodium levels are notably low, raising concerns about potential syndrome of inappropriate antidiuretic hormone secretion (SIADH) or diabetes insipidus. Plan: - Scheduled appointment to further evaluate the condition and explore the possibility of hormonal imbalances such as SIADH. - Consider additional diagnostic tests such as plasma osmolality and a 24-hour urine collection to assess for diabetes insipidus if not already performed.

## 2022-11-22 NOTE — Assessment & Plan Note (Signed)
The patient has well-controlled type 1 diabetes, managed with a continuous glucose monitor (CGM) and an insulin pump. His recent hemoglobin A1C levels are commendable, ranging from 5.9-5.8. Plan: - Continue current diabetes management regimen. - No changes indicated at this time.

## 2022-11-23 ENCOUNTER — Telehealth: Payer: Self-pay

## 2022-11-23 NOTE — Telephone Encounter (Signed)
Patient calling to check on surgery status.  Please advise.  Call:  [phone]

## 2022-11-24 ENCOUNTER — Ambulatory Visit: Payer: Medicare Other

## 2022-11-24 DIAGNOSIS — E871 Hypo-osmolality and hyponatremia: Secondary | ICD-10-CM | POA: Diagnosis not present

## 2022-11-24 NOTE — Progress Notes (Signed)
Talked with patient about AFO options patient believes he received left AFO approx. 2 years ago and with medicare they will only cover every 30yrs. He c/o shin pain from strap I asked if he has gone back for adjustments from where he got it and he stated no, patient does not believe it helps him much when he does wear and then when he does wear it is so uncomfortable he takes it off. Right leg / foot drop is much less severe and does not have an AFO for this LE, if he feels he needs one for Right LE he will talk with Dr. I gave patient coding for AFO's that he may call Medicare himself to verify freq. And qty.  Patient wants to stay as low profile of a brace as possible  Addison Bailey CPed, CFo, CFm

## 2022-11-25 DIAGNOSIS — E871 Hypo-osmolality and hyponatremia: Secondary | ICD-10-CM | POA: Diagnosis not present

## 2022-11-25 NOTE — Telephone Encounter (Signed)
I returned patient's call and left a voicemail requesting a call back.  Will call back Friday

## 2022-11-27 NOTE — Telephone Encounter (Signed)
Patient given new surgery date.  See surgery referral for details.

## 2022-12-04 DIAGNOSIS — E871 Hypo-osmolality and hyponatremia: Secondary | ICD-10-CM | POA: Diagnosis not present

## 2022-12-10 NOTE — Patient Instructions (Addendum)
Christopher Conner  12/10/2022     @PREFPERIOPPHARMACY @   Your procedure is scheduled on  12/17/2022.   Report to [facility] at  0930  A.M.   Call this number if you have problems the morning of surgery:  [phone]  If you experience any cold or flu symptoms such as cough, fever, chills, shortness of breath, etc. between now and your scheduled surgery, please notify us at the above number.   Remember:  Do not eat or drink after midnight.       Dial your insulin pump down to your basal rate at bedtime the night before your procedure.      Take these medicines the morning of surgery with A SIP OF WATER                                 amlodipine, carvedilol, antivert.     Do not wear jewelry, make-up or nail polish, including gel polish,  artificial nails, or any other type of covering on natural nails (fingers and  toes).  Do not wear lotions, powders, or perfumes, or deodorant.  Do not shave 48 hours prior to surgery.  Men may shave face and neck.  Do not bring valuables to the hospital.  [facility] is not responsible for any belongings or valuables.  Contacts, dentures or bridgework may not be worn into surgery.  Leave your suitcase in the car.  After surgery it may be brought to your room.  For patients admitted to the hospital, discharge time will be determined by your treatment team.  Patients discharged the day of surgery will not be allowed to drive home and must have someone with them for 24 hours.    Special instructions:   DO  NOT smoke tobacco or vape for 24 hours before your procedure.   Please read over the following fact sheets that you were given. Coughing and Deep Breathing, Anesthesia Post-op Instructions, and Care and Recovery After Surgery         Botulinum Toxin Bladder Injection  A botulinum toxin bladder injection is a procedure to treat an overactive bladder. During the procedure, a drug called botulinum toxin is injected into the  bladder through a long, thin needle. This drug relaxes the bladder muscles and reduces overactivity. You may need this procedure if your medicines are not working or you cannot take them. The procedure may be repeated as needed. The treatment is done once and it usually lasts for 6 months. Your health care provider will monitor you to see how well you respond. Tell a health care provider about: Any allergies you have. All medicines you are taking, including vitamins, herbs, eye drops, creams, and over-the-counter medicines. Any problems you or family members have had with anesthetic medicines. Any bleeding problems you have. Any surgeries you have had. Any medical conditions you have. Any previous reactions to a botulinum toxin injection. Any symptoms of urinary tract infection. These include chills, fever, a burning feeling when passing urine, and needing to pass urine often. Whether you are pregnant or may be pregnant. What are the risks? Generally this is a safe procedure. However, problems may occur, including: Not being able to pass urine. If this happens, you may need to have your bladder emptied with a thin tube (urinary catheter). Bleeding. Urinary tract infection. Allergic reaction to the botulinum toxin. Pain or burning when passing urine. Damage  to nearby structures or organs. What happens before the procedure? When to stop eating and drinking Follow instructions from your health care provider about what you may eat and drink before your procedure. These may include: 8 hours before the procedure Stop eating most foods. Do not eat meat, fried foods, or fatty foods. Eat only light foods, such as toast or crackers. All liquids are okay except energy drinks and alcohol. 6 hours before the procedure Stop eating. Drink only clear liquids, such as water, clear fruit juice, black coffee, plain tea, and sports drinks. Do not drink energy drinks or alcohol. 2 hours before the  procedure Stop drinking all liquids. You may be allowed to take medicines with small sips of water. If you do not follow your health care provider's instructions, your procedure may be delayed or canceled. Medicines Ask your health care provider about: Changing or stopping your regular medicines. This is especially important if you are taking diabetes medicines or blood thinners. Taking medicines such as aspirin and ibuprofen. These medicines can thin your blood. Do not take these medicines unless your health care provider tells you to take them. Taking over-the-counter medicines, vitamins, herbs, and supplements. General instructions Ask your health care provider what steps will be taken to help prevent infection. These steps may include: Removing hair at the procedure site. Washing skin with a germ-killing soap. Taking antibiotic medicine. If you will be going home right after the procedure, plan to have a responsible adult: Take you home from the hospital or clinic. You will not be allowed to drive. Care for you for the time you are told. What happens during the procedure?  You will be asked to empty your bladder. An IV will be inserted into one of your veins. You will be given one or more of the following: A medicine to help you relax (sedative). A medicine to numb the area (local anesthetic). A medicine to make you fall asleep (general anesthetic). A long, thin scope called a cystoscope will be passed into your bladder through the part of the body that carries urine from your bladder (urethra). The cystoscope will be used to fill your bladder with water. A long needle will be passed through the cystoscope and into the bladder. The botulinum toxin will be injected into your bladder. It may be injected into multiple areas of your bladder. The cystoscope will be removed and your bladder will be emptied with a urinary catheter. The procedure may vary among health care providers and  hospitals. What can I expect after the procedure? After your procedure, it is common to have: Blood-tinged urine. Burning or soreness when you pass urine. Follow these instructions at home: Medicines Take over-the-counter and prescription medicines only as told by your health care provider. If you were prescribed an antibiotic medicine, take it as told by your health care provider. Do not stop using the antibiotic even if you start to feel better. General instructions  If you were given a sedative during the procedure, it can affect you for several hours. Do not drive or operate machinery until your health care provider says that it is safe. Drink enough fluid to keep your urine pale yellow. Return to your normal activities as told by your health care provider. Ask your health care provider what activities are safe for you. Keep all follow-up visits. Contact a health care provider if you have: A fever or chills. Blood-tinged urine for more than one day after your procedure. Worsening pain or burning  when you pass urine. Pain or burning when passing urine for more than two days after your procedure. Trouble emptying your bladder. Get help right away if you: Have bright red blood in your urine. Are unable to pass urine. Summary A botulinum toxin bladder injection is a procedure to treat an overactive bladder. This is generally a safe procedure. However, problems may occur, including not being able to pass urine, bleeding, infection, pain, and an allergic reaction to the botulinum toxin. You will be told when to stop eating and drinking, and what medicines to change or stop. Follow instructions carefully. After the procedure, it is common to have blood in your urine and to have soreness or burning when passing urine. Contact a health care provider if you have a fever, blood in your urine for more than a few days, or trouble passing urine. Get help right away if you have bright red blood in  your urine, or if you are unable to pass urine. This information is not intended to replace advice given to you by your health care provider. Make sure you discuss any questions you have with your health care provider. Document Revised: 10/11/2020 Document Reviewed: 10/11/2020 Elsevier Patient Education  2024 [employer] General Anesthesia, Adult, Care After The following information offers guidance on how to care for yourself after your procedure. Your health care provider may also give you more specific instructions. If you have problems or questions, contact your health care provider. What can I expect after the procedure? After the procedure, it is common for people to: Have pain or discomfort at the IV site. Have nausea or vomiting. Have a sore throat or hoarseness. Have trouble concentrating. Feel cold or chills. Feel weak, sleepy, or tired (fatigue). Have soreness and body aches. These can affect parts of the body that were not involved in surgery. Follow these instructions at home: For the time period you were told by your health care provider:  Rest. Do not participate in activities where you could fall or become injured. Do not drive or use machinery. Do not drink alcohol. Do not take sleeping pills or medicines that cause drowsiness. Do not make important decisions or sign legal documents. Do not take care of children on your own. General instructions Drink enough fluid to keep your urine pale yellow. If you have sleep apnea, surgery and certain medicines can increase your risk for breathing problems. Follow instructions from your health care provider about wearing your sleep device: Anytime you are sleeping, including during daytime naps. While taking prescription pain medicines, sleeping medicines, or medicines that make you drowsy. Return to your normal activities as told by your health care provider. Ask your health care provider what activities are safe for you. Take  over-the-counter and prescription medicines only as told by your health care provider. Do not use any products that contain nicotine or tobacco. These products include cigarettes, chewing tobacco, and vaping devices, such as e-cigarettes. These can delay incision healing after surgery. If you need help quitting, ask your health care provider. Contact a health care provider if: You have nausea or vomiting that does not get better with medicine. You vomit every time you eat or drink. You have pain that does not get better with medicine. You cannot urinate or have bloody urine. You develop a skin rash. You have a fever. Get help right away if: You have trouble breathing. You have chest pain. You vomit blood. These symptoms may be an emergency. Get help right away.  Call 911. Do not wait to see if the symptoms will go away. Do not drive yourself to the hospital. Summary After the procedure, it is common to have a sore throat, hoarseness, nausea, vomiting, or to feel weak, sleepy, or fatigue. For the time period you were told by your health care provider, do not drive or use machinery. Get help right away if you have difficulty breathing, have chest pain, or vomit blood. These symptoms may be an emergency. This information is not intended to replace advice given to you by your health care provider. Make sure you discuss any questions you have with your health care provider. Document Revised: 07/04/2021 Document Reviewed: 07/04/2021 Elsevier Patient Education  2024 [employer] How to Use Chlorhexidine Before Surgery Chlorhexidine gluconate (CHG) is a germ-killing (antiseptic) solution that is used to clean the skin. It can get rid of the bacteria that normally live on the skin and can keep them away for about 24 hours. To clean your skin with CHG, you may be given: A CHG solution to use in the shower or as part of a sponge bath. A prepackaged cloth that contains CHG. Cleaning your skin with  CHG may help lower the risk for infection: While you are staying in the intensive care unit of the hospital. If you have a vascular access, such as a central line, to provide short-term or long-term access to your veins. If you have a catheter to drain urine from your bladder. If you are on a ventilator. A ventilator is a machine that helps you breathe by moving air in and out of your lungs. After surgery. What are the risks? Risks of using CHG include: A skin reaction. Hearing loss, if CHG gets in your ears and you have a perforated eardrum. Eye injury, if CHG gets in your eyes and is not rinsed out. The CHG product catching fire. Make sure that you avoid smoking and flames after applying CHG to your skin. Do not use CHG: If you have a chlorhexidine allergy or have previously reacted to chlorhexidine. On babies younger than 15 months of age. How to use CHG solution Use CHG only as told by your health care provider, and follow the instructions on the label. Use the full amount of CHG as directed. Usually, this is one bottle. During a shower Follow these steps when using CHG solution during a shower (unless your health care provider gives you different instructions): Start the shower. Use your normal soap and shampoo to wash your face and hair. Turn off the shower or move out of the shower stream. Pour the CHG onto a clean washcloth. Do not use any type of brush or rough-edged sponge. Starting at your neck, lather your body down to your toes. Make sure you follow these instructions: If you will be having surgery, pay special attention to the part of your body where you will be having surgery. Scrub this area for at least 1 minute. Do not use CHG on your head or face. If the solution gets into your ears or eyes, rinse them well with water. Avoid your genital area. Avoid any areas of skin that have broken skin, cuts, or scrapes. Scrub your back and under your arms. Make sure to wash skin  folds. Let the lather sit on your skin for 1-2 minutes or as long as told by your health care provider. Thoroughly rinse your entire body in the shower. Make sure that all body creases and crevices are rinsed well. Dry  off with a clean towel. Do not put any substances on your body afterward--such as powder, lotion, or perfume--unless you are told to do so by your health care provider. Only use lotions that are recommended by the manufacturer. Put on clean clothes or pajamas. If it is the night before your surgery, sleep in clean sheets.  During a sponge bath Follow these steps when using CHG solution during a sponge bath (unless your health care provider gives you different instructions): Use your normal soap and shampoo to wash your face and hair. Pour the CHG onto a clean washcloth. Starting at your neck, lather your body down to your toes. Make sure you follow these instructions: If you will be having surgery, pay special attention to the part of your body where you will be having surgery. Scrub this area for at least 1 minute. Do not use CHG on your head or face. If the solution gets into your ears or eyes, rinse them well with water. Avoid your genital area. Avoid any areas of skin that have broken skin, cuts, or scrapes. Scrub your back and under your arms. Make sure to wash skin folds. Let the lather sit on your skin for 1-2 minutes or as long as told by your health care provider. Using a different clean, wet washcloth, thoroughly rinse your entire body. Make sure that all body creases and crevices are rinsed well. Dry off with a clean towel. Do not put any substances on your body afterward--such as powder, lotion, or perfume--unless you are told to do so by your health care provider. Only use lotions that are recommended by the manufacturer. Put on clean clothes or pajamas. If it is the night before your surgery, sleep in clean sheets. How to use CHG prepackaged cloths Only use CHG  cloths as told by your health care provider, and follow the instructions on the label. Use the CHG cloth on clean, dry skin. Do not use the CHG cloth on your head or face unless your health care provider tells you to. When washing with the CHG cloth: Avoid your genital area. Avoid any areas of skin that have broken skin, cuts, or scrapes. Before surgery Follow these steps when using a CHG cloth to clean before surgery (unless your health care provider gives you different instructions): Using the CHG cloth, vigorously scrub the part of your body where you will be having surgery. Scrub using a back-and-forth motion for 3 minutes. The area on your body should be completely wet with CHG when you are done scrubbing. Do not rinse. Discard the cloth and let the area air-dry. Do not put any substances on the area afterward, such as powder, lotion, or perfume. Put on clean clothes or pajamas. If it is the night before your surgery, sleep in clean sheets.  For general bathing Follow these steps when using CHG cloths for general bathing (unless your health care provider gives you different instructions). Use a separate CHG cloth for each area of your body. Make sure you wash between any folds of skin and between your fingers and toes. Wash your body in the following order, switching to a new cloth after each step: The front of your neck, shoulders, and chest. Both of your arms, under your arms, and your hands. Your stomach and groin area, avoiding the genitals. Your right leg and foot. Your left leg and foot. The back of your neck, your back, and your buttocks. Do not rinse. Discard the cloth and let the  area air-dry. Do not put any substances on your body afterward--such as powder, lotion, or perfume--unless you are told to do so by your health care provider. Only use lotions that are recommended by the manufacturer. Put on clean clothes or pajamas. Contact a health care provider if: Your skin gets  irritated after scrubbing. You have questions about using your solution or cloth. You swallow any chlorhexidine. Call your local poison control center (301-034-7325 in the [place] Get help right away if: Your eyes itch badly, or they become very red or swollen. Your skin itches badly and is red or swollen. Your hearing changes. You have trouble seeing. You have swelling or tingling in your mouth or throat. You have trouble breathing. These symptoms may represent a serious problem that is an emergency. Do not wait to see if the symptoms will go away. Get medical help right away. Call your local emergency services (911 in the [place] Do not drive yourself to the hospital. Summary Chlorhexidine gluconate (CHG) is a germ-killing (antiseptic) solution that is used to clean the skin. Cleaning your skin with CHG may help to lower your risk for infection. You may be given CHG to use for bathing. It may be in a bottle or in a prepackaged cloth to use on your skin. Carefully follow your health care provider's instructions and the instructions on the product label. Do not use CHG if you have a chlorhexidine allergy. Contact your health care provider if your skin gets irritated after scrubbing. This information is not intended to replace advice given to you by your health care provider. Make sure you discuss any questions you have with your health care provider. Document Revised: 08/04/2021 Document Reviewed: 06/17/2020 Elsevier Patient Education  2023 [employer].

## 2022-12-11 ENCOUNTER — Encounter (HOSPITAL_COMMUNITY): Payer: Self-pay

## 2022-12-11 ENCOUNTER — Encounter (HOSPITAL_COMMUNITY)
Admission: RE | Admit: 2022-12-11 | Discharge: 2022-12-11 | Disposition: A | Discharge status: Home / Self Care | Payer: Medicare Other | Source: Ambulatory Visit | Attending: Urology | Admitting: Urology

## 2022-12-11 VITALS — BP 146/69 | HR 63 | Temp 97.8°F | Resp 18 | Ht 68.5 in | Wt 170.6 lb

## 2022-12-11 DIAGNOSIS — I10 Essential (primary) hypertension: Secondary | ICD-10-CM | POA: Diagnosis not present

## 2022-12-11 DIAGNOSIS — E109 Type 1 diabetes mellitus without complications: Secondary | ICD-10-CM | POA: Insufficient documentation

## 2022-12-11 DIAGNOSIS — Z01818 Encounter for other preprocedural examination: Secondary | ICD-10-CM | POA: Insufficient documentation

## 2022-12-11 DIAGNOSIS — E871 Hypo-osmolality and hyponatremia: Secondary | ICD-10-CM | POA: Diagnosis not present

## 2022-12-11 DIAGNOSIS — C9110 Chronic lymphocytic leukemia of B-cell type not having achieved remission: Secondary | ICD-10-CM | POA: Insufficient documentation

## 2022-12-11 HISTORY — DX: Foot drop, right foot: M21.371

## 2022-12-11 HISTORY — DX: Foot drop, right foot: M21.372

## 2022-12-11 LAB — CBC WITH DIFFERENTIAL/PLATELET
Abs Immature Granulocytes: 0 10*3/uL (ref 0.00–0.07)
Basophils Absolute: 0 10*3/uL (ref 0.0–0.1)
Basophils Relative: 0 %
Eosinophils Absolute: 0.3 10*3/uL (ref 0.0–0.5)
Eosinophils Relative: 2 %
HCT: 35.9 % — ABNORMAL LOW (ref 39.0–52.0)
Hemoglobin: 11.8 g/dL — ABNORMAL LOW (ref 13.0–17.0)
Lymphocytes Relative: 63 %
Lymphs Abs: 9.3 10*3/uL — ABNORMAL HIGH (ref 0.7–4.0)
MCH: 33.6 pg (ref 26.0–34.0)
MCHC: 32.9 g/dL (ref 30.0–36.0)
MCV: 102.3 fL — ABNORMAL HIGH (ref 80.0–100.0)
Monocytes Absolute: 0.7 10*3/uL (ref 0.1–1.0)
Monocytes Relative: 5 %
Neutro Abs: 4.4 10*3/uL (ref 1.7–7.7)
Neutrophils Relative %: 30 %
Platelets: 260 10*3/uL (ref 150–400)
RBC: 3.51 MIL/uL — ABNORMAL LOW (ref 4.22–5.81)
RDW: 12.5 % (ref 11.5–15.5)
WBC: 14.8 10*3/uL — ABNORMAL HIGH (ref 4.0–10.5)
nRBC: 0 % (ref 0.0–0.2)

## 2022-12-11 LAB — BASIC METABOLIC PANEL
Anion gap: 10 (ref 5–15)
BUN: 22 mg/dL (ref 8–23)
CO2: 23 mmol/L (ref 22–32)
Calcium: 8.8 mg/dL — ABNORMAL LOW (ref 8.9–10.3)
Chloride: 96 mmol/L — ABNORMAL LOW (ref 98–111)
Creatinine, Ser: 0.75 mg/dL (ref 0.61–1.24)
GFR, Estimated: >60 mL/min (ref >60)
Glucose, Bld: 75 mg/dL (ref 70–99)
Potassium: 4.1 mmol/L (ref 3.5–5.1)
Sodium: 129 mmol/L — ABNORMAL LOW (ref 135–145)

## 2022-12-11 LAB — HEMOGLOBIN A1C
Hgb A1c MFr Bld: 6 % — ABNORMAL HIGH (ref 4.8–5.6)
Mean Plasma Glucose: 125.5 mg/dL

## 2022-12-15 ENCOUNTER — Telehealth: Payer: Self-pay | Admitting: Urology

## 2022-12-15 ENCOUNTER — Telehealth: Payer: Self-pay

## 2022-12-15 NOTE — Telephone Encounter (Signed)
Patient called wants to know what his follow up appointments will be after the procedure. Will you set them up now or will he get them after the surgery?

## 2022-12-15 NOTE — Telephone Encounter (Signed)
Patient is requesting you review his list of supplements listed in his chart.  He is asking if any of those should be held prior to his surgery on 08/29.  He had been told in the past to hold some supplements.

## 2022-12-16 NOTE — Telephone Encounter (Signed)
Verbal from Dr. Ronne Binning that patient does not have to hold any supplements prior to procedure.  Patient voiced understanding.

## 2022-12-16 NOTE — Telephone Encounter (Signed)
Patient scheduled with Dr. Ronne Binning 09/13 for post op apt

## 2022-12-17 ENCOUNTER — Ambulatory Visit (HOSPITAL_BASED_OUTPATIENT_CLINIC_OR_DEPARTMENT_OTHER): Payer: Medicare Other | Admitting: Anesthesiology

## 2022-12-17 ENCOUNTER — Ambulatory Visit (HOSPITAL_COMMUNITY): Payer: Medicare Other | Admitting: Anesthesiology

## 2022-12-17 ENCOUNTER — Encounter (HOSPITAL_COMMUNITY): Payer: Self-pay | Admitting: Urology

## 2022-12-17 ENCOUNTER — Encounter (HOSPITAL_COMMUNITY)
Admission: RE | Disposition: A | Discharge status: Home / Self Care | Payer: Self-pay | Source: Home / Self Care | Attending: Urology

## 2022-12-17 ENCOUNTER — Ambulatory Visit (HOSPITAL_COMMUNITY)
Admission: RE | Admit: 2022-12-17 | Discharge: 2022-12-17 | Disposition: A | Discharge status: Home / Self Care | Payer: Medicare Other | Attending: Urology | Admitting: Urology

## 2022-12-17 DIAGNOSIS — I10 Essential (primary) hypertension: Secondary | ICD-10-CM | POA: Diagnosis not present

## 2022-12-17 DIAGNOSIS — R351 Nocturia: Secondary | ICD-10-CM | POA: Diagnosis not present

## 2022-12-17 DIAGNOSIS — N401 Enlarged prostate with lower urinary tract symptoms: Secondary | ICD-10-CM | POA: Insufficient documentation

## 2022-12-17 DIAGNOSIS — N3281 Overactive bladder: Secondary | ICD-10-CM | POA: Diagnosis not present

## 2022-12-17 DIAGNOSIS — E871 Hypo-osmolality and hyponatremia: Secondary | ICD-10-CM | POA: Diagnosis not present

## 2022-12-17 DIAGNOSIS — E119 Type 2 diabetes mellitus without complications: Secondary | ICD-10-CM | POA: Diagnosis not present

## 2022-12-17 HISTORY — PX: CYSTOSCOPY WITH INJECTION: SHX1424

## 2022-12-17 LAB — GLUCOSE, CAPILLARY
Glucose-Capillary: 74 mg/dL (ref 70–99)
Glucose-Capillary: 103 mg/dL — ABNORMAL HIGH (ref 70–99)

## 2022-12-17 SURGERY — CYSTOSCOPY, WITH INJECTION OF BLADDER NECK OR BLADDER WALL
Anesthesia: General | Site: Bladder

## 2022-12-17 MED ORDER — SODIUM CHLORIDE (PF) 0.9 % IJ SOLN
INTRAMUSCULAR | Status: DC | PRN
  Administered 2022-12-17: 10 mL
  Administered 2022-12-17: 20 mL

## 2022-12-17 MED ORDER — FENTANYL CITRATE (PF) 100 MCG/2ML IJ SOLN
INTRAMUSCULAR | Status: AC
  Filled 2022-12-17: qty 2

## 2022-12-17 MED ORDER — LACTATED RINGERS IV SOLN: INTRAVENOUS | Status: DC | PRN

## 2022-12-17 MED ORDER — ONDANSETRON HCL 4 MG/2ML IJ SOLN: 4.0000 mg | Freq: Once | INTRAMUSCULAR | Status: DC | PRN

## 2022-12-17 MED ORDER — LACTATED RINGERS IV SOLN: INTRAVENOUS | Status: DC

## 2022-12-17 MED ORDER — OXYCODONE HCL 5 MG PO TABS: 5.0000 mg | ORAL_TABLET | Freq: Once | ORAL | Status: DC | PRN

## 2022-12-17 MED ORDER — CEFAZOLIN SODIUM-DEXTROSE 2-4 GM/100ML-% IV SOLN
INTRAVENOUS | Status: AC
  Filled 2022-12-17: qty 100

## 2022-12-17 MED ORDER — OXYCODONE HCL 5 MG/5ML PO SOLN: 5.0000 mg | Freq: Once | ORAL | Status: DC | PRN

## 2022-12-17 MED ORDER — CHLORHEXIDINE GLUCONATE 0.12 % MT SOLN
OROMUCOSAL | Status: AC
  Filled 2022-12-17: qty 15

## 2022-12-17 MED ORDER — CEFAZOLIN SODIUM-DEXTROSE 2-4 GM/100ML-% IV SOLN
2.0000 g | INTRAVENOUS | Status: AC
  Administered 2022-12-17: 2 g via INTRAVENOUS

## 2022-12-17 MED ORDER — ONABOTULINUMTOXINA 100 UNITS IJ SOLR
INTRAMUSCULAR | Status: DC | PRN
  Administered 2022-12-17: 100 [IU] via INTRAMUSCULAR

## 2022-12-17 MED ORDER — ONDANSETRON HCL 4 MG/2ML IJ SOLN
INTRAMUSCULAR | Status: DC | PRN
  Administered 2022-12-17: 4 mg via INTRAVENOUS

## 2022-12-17 MED ORDER — ONABOTULINUMTOXINA 100 UNITS IJ SOLR: 100.0000 [IU] | Freq: Once | INTRAMUSCULAR | Status: DC

## 2022-12-17 MED ORDER — ONABOTULINUMTOXINA 100 UNITS IJ SOLR
INTRAMUSCULAR | Status: AC
  Filled 2022-12-17: qty 100

## 2022-12-17 MED ORDER — PROPOFOL 10 MG/ML IV BOLUS
INTRAVENOUS | Status: DC | PRN
  Administered 2022-12-17: 150 mg via INTRAVENOUS

## 2022-12-17 MED ORDER — SODIUM CHLORIDE (PF) 0.9 % IJ SOLN
INTRAMUSCULAR | Status: AC
  Filled 2022-12-17: qty 10

## 2022-12-17 MED ORDER — ORAL CARE MOUTH RINSE: 15.0000 mL | Freq: Once | OROMUCOSAL | Status: DC

## 2022-12-17 MED ORDER — SODIUM CHLORIDE (PF) 0.9 % IJ SOLN
INTRAMUSCULAR | Status: AC
  Filled 2022-12-17: qty 20

## 2022-12-17 MED ORDER — FENTANYL CITRATE (PF) 100 MCG/2ML IJ SOLN
INTRAMUSCULAR | Status: DC | PRN
  Administered 2022-12-17 (×2): 50 ug via INTRAVENOUS

## 2022-12-17 MED ORDER — FENTANYL CITRATE PF 50 MCG/ML IJ SOSY: 25.0000 ug | PREFILLED_SYRINGE | INTRAMUSCULAR | Status: DC | PRN

## 2022-12-17 MED ORDER — CHLORHEXIDINE GLUCONATE 0.12 % MT SOLN: 15.0000 mL | Freq: Once | OROMUCOSAL | Status: DC

## 2022-12-17 SURGICAL SUPPLY — 23 items
BAG DRAIN URO TABLE W/ADPT NS (BAG) ×1 IMPLANT
BAG DRN 8 ADPR NS SKTRN CSTL (BAG) ×1
BAG HAMPER (MISCELLANEOUS) ×1 IMPLANT
CLOTH BEACON ORANGE TIMEOUT ST (SAFETY) ×1 IMPLANT
GLOVE BIOGEL PI IND STRL 7.0 (GLOVE) ×2 IMPLANT
GLOVE SURG SS PI 6.5 STRL IVOR (GLOVE) IMPLANT
GLOVE SURG SS PI 8.0 STRL IVOR (GLOVE) IMPLANT
GOWN STRL REUS W/TWL LRG LVL3 (GOWN DISPOSABLE) ×1 IMPLANT
GOWN STRL REUS W/TWL XL LVL3 (GOWN DISPOSABLE) ×1 IMPLANT
KIT TURNOVER CYSTO (KITS) ×1 IMPLANT
MANIFOLD NEPTUNE II (INSTRUMENTS) ×1 IMPLANT
NDL ASPIRATION 22 (NEEDLE) ×1 IMPLANT
NDL HYPO 18GX1.5 BLUNT FILL (NEEDLE) ×1 IMPLANT
NEEDLE ASPIRATION 22 (NEEDLE) ×1
NEEDLE HYPO 18GX1.5 BLUNT FILL (NEEDLE) ×1
PACK CYSTO (CUSTOM PROCEDURE TRAY) ×1 IMPLANT
PAD ARMBOARD 7.5X6 YLW CONV (MISCELLANEOUS) ×1 IMPLANT
POSITIONER HEAD 8X9X4 ADT (SOFTGOODS) ×1 IMPLANT
SYR 30ML LL (SYRINGE) ×1 IMPLANT
SYR CONTROL 10ML LL (SYRINGE) ×1 IMPLANT
TOWEL OR 17X26 4PK STRL BLUE (TOWEL DISPOSABLE) ×1 IMPLANT
WATER STERILE IRR 3000ML UROMA (IV SOLUTION) ×1 IMPLANT
WATER STERILE IRR 500ML POUR (IV SOLUTION) ×1 IMPLANT

## 2022-12-17 NOTE — H&P (Signed)
Expand All Collapse All     10/28/2022 1:34 PM    Christopher Conner 1951/03/29 [id number]   Referring provider: Georgianne Fick, MD [address] [place],  [place] [address]   Followup OAB     HPI: Mr Mickels is a 72yo here for intravesical botox injection. He is currently on toviaz 8mg  daily. He continue to have bothersome urinary frequency and urgency. He has nocturia 3-5x. He has failed gemtesa, mirabegron.     PMH:     Past Medical History:  Diagnosis Date   Arthritis     BPH (benign prostatic hyperplasia)     Cataract     Chronic lymphocytic leukemia (HCC) early 2018    no current treatment   CLL (chronic lymphocytic leukemia) (HCC)     DM type 1 (diabetes mellitus, type 1) (HCC)      type 1   High cholesterol     Hypertension     Neuromuscular disorder (HCC)      diabetic neuropathy   Normal pressure hydrocephalus (HCC) 2006    brain shut inserted          Surgical History:      Past Surgical History:  Procedure Laterality Date   APPENDECTOMY   early 20's   BACK SURGERY       BRAIN SURGERY   10/30/04    brain shunt, Codman Hakim   CATARACT EXTRACTION Bilateral      left eye prostheisi "shell"   CYST REMOVAL NECK   yrs ago   CYSTOSCOPY WITH INSERTION OF UROLIFT   april/march 2020    done in office   CYSTOSCOPY WITH INSERTION OF UROLIFT [name] 10/12/2019    Procedure: CYSTOSCOPY WITH INSERTION OF UROLIFT;  Surgeon: Malen Gauze, MD;  Location: [facility];  Service: Urology;  Laterality: [name];   EYE SURGERY       LUMBAR FUSION   1998 and 2000    l5 to s1 1998, then l4 and l5 in 2000   SPINE SURGERY        H. Elsner, L4-5 & L5-S1   VENTRICULOPERITONEAL SHUNT   2006          Home Medications:  Allergies as of 10/28/2022         Reactions    Lisinopril Cough    Latex Itching, Rash            Medication List           Accurate as of October 28, 2022  1:34 PM. If you have any questions, ask your nurse or doctor.               Acetyl L-Carnitine 250 MG Caps 1 capsule    Alpha-Lipoic Acid 300 MG Caps Take 1 capsule by mouth 2 (two) times daily.    amLODipine 10 MG tablet Commonly known as: NORVASC Take 1 tablet by mouth daily.    atorvastatin 80 MG tablet Commonly known as: LIPITOR Take 40 mg by mouth every evening.    B-100 COMPLEX CR PO 1 tablet    brompheniramine-pseudoephedrine-DM 30-2-10 MG/5ML syrup Take 5 mLs by mouth 3 (three) times daily as needed.    carvedilol 3.125 MG tablet Commonly known as: COREG Take 3.125 mg by mouth 2 (two) times daily with a meal.    Contour Next Test test strip Generic drug: glucose blood 2 (two) times daily.    CoQ-10 100 MG capsule See admin instructions.    fesoterodine  8 MG Tb24 tablet Commonly known as: TOVIAZ Take 1 tablet (8 mg total) by mouth daily.    fluticasone 50 MCG/ACT nasal spray Commonly known as: FLONASE Place 2 sprays into both nostrils daily.    folic acid 1 MG tablet Commonly known as: FOLVITE Take 1 mg by mouth daily.    Green Tea 150 MG Caps See admin instructions.    meclizine 25 MG tablet Commonly known as: ANTIVERT Take 1 tablet (25 mg total) by mouth 3 (three) times daily as needed for dizziness.    meloxicam 15 MG tablet Commonly known as: MOBIC Take 15 mg by mouth daily.    methotrexate 2.5 MG tablet Commonly known as: RHEUMATREX TAKE 8 TABLETS BY MOUTH 1 TIME A WEEK AS DIRECTED    NovoLOG 100 UNIT/ML injection Generic drug: insulin aspart INJECT UP TO 100 UNITS DAILY MAXIMUM DOSE WITH INSULIN PUMP DAILY    OMEGA 3-6-9 FATTY ACIDS PO Take 1 capsule by mouth 2 (two) times daily.    ondansetron 8 MG disintegrating tablet Commonly known as: Zofran ODT 8mg  ODT q6 hours prn nausea    OVER THE COUNTER MEDICATION Vitamin c 1000 mg daily    OVER THE COUNTER MEDICATION Vitamin d 3 2000 iu daily    OVER THE COUNTER MEDICATION probiotiotic qday    OVER THE COUNTER MEDICATION Methyl folate  1000 mg qd    OVER THE COUNTER MEDICATION Green tea extract 400 mg qd    OVER THE COUNTER MEDICATION Astragalis root 470 mg daily    OVER THE COUNTER MEDICATION Grape seed extract 200 mg qd    OVER THE COUNTER MEDICATION Acetyl l-caritine 500 gm qd    OVER THE COUNTER MEDICATION reservetrol 50 mg qd    OVER THE COUNTER MEDICATION coq10 120 mg qd    OVER THE COUNTER MEDICATION b complex 100 mg qd    REFRESH OP Apply 1-2 drops to eye every 3 (three) hours as needed (dryness).    telmisartan 40 MG tablet Commonly known as: MICARDIS 1 tablet    Vitamin D 50 MCG (2000 UT) Caps 1 capsule             Allergies:  Allergies      Allergies  Allergen Reactions   Lisinopril Cough   Latex Itching and Rash        Family History:      Family History  Problem Relation Age of Onset   Hypertension Mother     Hyperlipidemia Mother     Diabetes Mother     Diabetes Father            Social History:  reports that he has never smoked. He has never used smokeless tobacco. He reports that he does not drink alcohol and does not use drugs.   ROS: All other review of systems were reviewed and are negative except what is noted above in HPI   Physical Exam: BP (!) 152/76   Pulse 63   Constitutional:  Alert and oriented, No acute distress. HEENT: [place] AT, moist mucus membranes.  Trachea midline, no masses. Cardiovascular: No clubbing, cyanosis, or edema. Respiratory: Normal respiratory effort, no increased work of breathing. GI: Abdomen is soft, nontender, nondistended, no abdominal masses GU: No CVA tenderness.  Lymph: No cervical or inguinal lymphadenopathy. Skin: No rashes, bruises or suspicious lesions. Neurologic: Grossly intact, no focal deficits, moving all 4 extremities. Psychiatric: Normal mood and affect.   Laboratory Data: Recent Labs  Lab Results  Component Value Date    WBC 15.6 (H) 06/12/2022    HGB 11.9 (L) 06/12/2022    HCT 35.1 (L) 06/12/2022     MCV 102.0 (H) 06/12/2022    PLT 247 06/12/2022        Recent Labs       Lab Results  Component Value Date    CREATININE 0.95 07/28/2022        Recent Labs  No results found for: "PSA"     Recent Labs  No results found for: "TESTOSTERONE"     Recent Labs       Lab Results  Component Value Date    HGBA1C 6.7 (H) 04/10/2014        Urinalysis Labs (Brief)          Component Value Date/Time    COLORURINE YELLOW 08/16/2015 0244    APPEARANCEUR Clear 07/15/2022 1523    LABSPEC 1.029 08/16/2015 0244    PHURINE 5.5 08/16/2015 0244    GLUCOSEU Negative 07/15/2022 1523    HGBUR NEGATIVE 08/16/2015 0244    BILIRUBINUR Negative 07/15/2022 1523    KETONESUR >80 (A) 08/16/2015 0244    PROTEINUR Negative 07/15/2022 1523    PROTEINUR NEGATIVE 08/16/2015 0244    UROBILINOGEN 0.2 04/09/2014 1947    NITRITE Negative 07/15/2022 1523    NITRITE NEGATIVE 08/16/2015 0244    LEUKOCYTESUR Negative 07/15/2022 1523        Recent Labs       Lab Results  Component Value Date    LABMICR Comment 07/15/2022    BACTERIA RARE (A) 08/16/2015        Pertinent Imaging:   Results for orders placed during the hospital encounter of 05/23/10   DG Abd 1 View   Narrative *RADIOLOGY REPORT*   Clinical Data: Weakness with nausea and vomiting.   ABDOMEN - 1 VIEW   Comparison: 02/10/2010.   Findings: Ventriculoperitoneal shunt tubing extends into the right mid abdomen.  There is no shunt discontinuity.  Moderate stool is noted throughout the colon.  The bowel gas pattern is nonobstructive.  There are postsurgical changes of the lower lumbar spine status post fusion.   IMPRESSION:   1.  Moderate stool throughout the colon suggesting constipation. 2.  Intact ventricular peritoneal shunt.   Original Report Authenticated By: Gerrianne Scale, M.D.   No results found for this or any previous visit.   No results found for this or any previous visit.   No results found for  this or any previous visit.   No results found for this or any previous visit.   No valid procedures specified. No results found for this or any previous visit.   No results found for this or any previous visit.     Assessment & Plan:     1. Urinary frequency -We discussed PTNS, intravesical botox, and interstim/axonics. After discussing the options the patient elects for intravesical botox. Risks/benefits/alternatives discussed  - Urinalysis, Routine w reflex microscopic      No follow-ups on file.   Wilkie Aye, MD   [facility] [place]

## 2022-12-17 NOTE — Anesthesia Preprocedure Evaluation (Signed)
Anesthesia Evaluation  Patient identified by MRN, date of birth, ID band Patient awake    Reviewed: Allergy & Precautions, H&P , NPO status , Patient's Chart, lab work & pertinent test results, reviewed documented beta blocker date and time   Airway Mallampati: II  TM Distance: >3 FB Neck ROM: full    Dental no notable dental hx.    Pulmonary neg pulmonary ROS   Pulmonary exam normal breath sounds clear to auscultation       Cardiovascular Exercise Tolerance: Good hypertension, negative cardio ROS  Rhythm:regular Rate:Normal     Neuro/Psych  Neuromuscular disease negative neurological ROS  negative psych ROS   GI/Hepatic negative GI ROS, Neg liver ROS,,,  Endo/Other  negative endocrine ROSdiabetes    Renal/GU negative Renal ROS  negative genitourinary   Musculoskeletal   Abdominal   Peds  Hematology negative hematology ROS (+)   Anesthesia Other Findings   Reproductive/Obstetrics negative OB ROS                             Anesthesia Physical Anesthesia Plan  ASA: 3  Anesthesia Plan: General and General LMA   Post-op Pain Management:    Induction:   PONV Risk Score and Plan: Ondansetron  Airway Management Planned:   Additional Equipment:   Intra-op Plan:   Post-operative Plan:   Informed Consent: I have reviewed the patients History and Physical, chart, labs and discussed the procedure including the risks, benefits and alternatives for the proposed anesthesia with the patient or authorized representative who has indicated his/her understanding and acceptance.     Dental Advisory Given  Plan Discussed with: CRNA  Anesthesia Plan Comments:        Anesthesia Quick Evaluation

## 2022-12-17 NOTE — Transfer of Care (Signed)
Immediate Anesthesia Transfer of Care Note  Patient: Christopher Conner  Procedure(s) Performed: CYSTOSCOPY WITH INJECTION-Botox (Bladder)  Patient Location: PACU  Anesthesia Type:General  Level of Consciousness: awake, alert , oriented, and patient cooperative  Airway & Oxygen Therapy: Patient Spontanous Breathing and Patient connected to nasal cannula oxygen  Post-op Assessment: Report given to RN, Post -op Vital signs reviewed and stable, and Patient moving all extremities X 4  Post vital signs: Reviewed and stable  Last Vitals:  Vitals Value Taken Time  BP 120/54 12/17/22 1313  Temp 36.5 C 12/17/22 1313  Pulse 58 12/17/22 1315  Resp 18 12/17/22 1315  SpO2 99 % 12/17/22 1315  Vitals shown include unfiled device data.  Last Pain:  Vitals:   12/17/22 0952  TempSrc: Oral  PainSc: 0-No pain         Complications: No notable events documented.

## 2022-12-17 NOTE — Op Note (Signed)
Preoperative diagnosis: Overactive bladder  Postoperative diagnosis: same  Procedure: 1 cystoscopy 2. Intravesical botox injection 100 units  Attending: Wilkie Aye  Anesthesia: General  Estimated blood loss: Minimal  Drains: none  Specimens: none  Antibiotics: ancef  Findings:  Ureteral orifices in normal anatomic location.  No masses/lesions in the bladder  Indications: Patient is a 72 year old male with a history of overactive bladder urinary leakage refractory to anticholinergic therapy.  After discussing treatment options, they decided proceed with intravesical botox injection.  Procedure her in detail: The patient was brought to the operating room and a brief timeout was done to ensure correct patient, correct procedure, correct site.  General anesthesia was administered patient was placed in dorsal lithotomy position.  Their genitalia was then prepped and draped in usual sterile fashion.  A rigid 22 French cystoscope was passed in the urethra and the bladder.  Bladder was inspected and we noted no masses or lesions.  the ureteral orifices were in the normal orthotopic locations. Using a deflux injection needle we proceed to inject 100 units in a grid pattern between the ureteral orifices starting at the trigone to the mid posterior wall. We injected a total of 20 sites with 5 units per injection. The bladder was then drained and this concluded the procedure which was well tolerated by patient.  Complications: None  Condition: Stable, extubated, transferred to PACU  Plan: Patient is to be discharge home. They will followup in 1 month

## 2022-12-17 NOTE — Anesthesia Procedure Notes (Signed)
Procedure Name: LMA Insertion Date/Time: 12/17/2022 12:45 PM  Performed by: Shanon Payor, CRNAPre-anesthesia Checklist: Patient identified, Emergency Drugs available, Suction available, Patient being monitored and Timeout performed Patient Re-evaluated:Patient Re-evaluated prior to induction Oxygen Delivery Method: Circle system utilized Preoxygenation: Pre-oxygenation with 100% oxygen Induction Type: IV induction LMA: LMA inserted LMA Size: 4.0 Number of attempts: 1 Placement Confirmation: positive ETCO2, CO2 detector and breath sounds checked- equal and bilateral Tube secured with: Tape Dental Injury: Teeth and Oropharynx as per pre-operative assessment

## 2022-12-18 MED ORDER — WATER FOR IRRIGATION, STERILE IR SOLN
Status: AC | PRN
  Administered 2022-12-17: 3000 mL
  Administered 2022-12-18: 500 mL

## 2022-12-18 NOTE — Anesthesia Postprocedure Evaluation (Signed)
Anesthesia Post Note  Patient: Christopher Conner  Procedure(s) Performed: CYSTOSCOPY WITH INJECTION-Botox (Bladder)  Patient location during evaluation: Phase II Anesthesia Type: General Level of consciousness: awake Pain management: pain level controlled Vital Signs Assessment: post-procedure vital signs reviewed and stable Respiratory status: spontaneous breathing and respiratory function stable Cardiovascular status: blood pressure returned to baseline and stable Postop Assessment: no headache and no apparent nausea or vomiting Anesthetic complications: no Comments: Late entry   No notable events documented.   Last Vitals:  Vitals:   12/17/22 1345 12/17/22 1349  BP: (!) 163/61 (!) 167/61  Pulse: (!) 58 60  Resp: 17 14  Temp:  36.4 C  SpO2: 100% 100%    Last Pain:  Vitals:   12/17/22 1349  TempSrc: Axillary  PainSc: 0-No pain                 Windell Norfolk

## 2022-12-22 ENCOUNTER — Encounter (HOSPITAL_COMMUNITY): Payer: Self-pay | Admitting: Urology

## 2022-12-23 ENCOUNTER — Telehealth: Payer: Self-pay

## 2022-12-23 NOTE — Telephone Encounter (Signed)
Patient states that he was having hematuria after his botox procedure.  He is still having urgency and the hematuria has returned.  He is asking if this is normal after botox or if maybe it could be a concern with his previous urolift implant coming lose from holding his urine in to make it to the bathroom.

## 2022-12-25 NOTE — Telephone Encounter (Signed)
Dr. Ronne Binning made aware, per MD that is normal after his procedure and he does not think there is cause for concern from previous urolift.  Patient notified via Mychart message.

## 2022-12-30 ENCOUNTER — Ambulatory Visit: Payer: Medicare Other | Admitting: Urology

## 2022-12-30 DIAGNOSIS — I10 Essential (primary) hypertension: Secondary | ICD-10-CM | POA: Diagnosis not present

## 2022-12-30 DIAGNOSIS — M1990 Unspecified osteoarthritis, unspecified site: Secondary | ICD-10-CM | POA: Diagnosis not present

## 2022-12-30 DIAGNOSIS — I700 Atherosclerosis of aorta: Secondary | ICD-10-CM | POA: Diagnosis not present

## 2022-12-30 DIAGNOSIS — E7800 Pure hypercholesterolemia, unspecified: Secondary | ICD-10-CM | POA: Diagnosis not present

## 2022-12-30 DIAGNOSIS — E1021 Type 1 diabetes mellitus with diabetic nephropathy: Secondary | ICD-10-CM | POA: Diagnosis not present

## 2022-12-30 DIAGNOSIS — C9190 Lymphoid leukemia, unspecified not having achieved remission: Secondary | ICD-10-CM | POA: Diagnosis not present

## 2022-12-30 DIAGNOSIS — N182 Chronic kidney disease, stage 2 (mild): Secondary | ICD-10-CM | POA: Diagnosis not present

## 2022-12-30 DIAGNOSIS — E871 Hypo-osmolality and hyponatremia: Secondary | ICD-10-CM | POA: Diagnosis not present

## 2023-01-01 ENCOUNTER — Ambulatory Visit (INDEPENDENT_AMBULATORY_CARE_PROVIDER_SITE_OTHER): Payer: Medicare Other | Admitting: Urology

## 2023-01-01 VITALS — BP 130/57 | HR 63

## 2023-01-01 DIAGNOSIS — R350 Frequency of micturition: Secondary | ICD-10-CM

## 2023-01-01 DIAGNOSIS — N401 Enlarged prostate with lower urinary tract symptoms: Secondary | ICD-10-CM

## 2023-01-01 DIAGNOSIS — R3915 Urgency of urination: Secondary | ICD-10-CM | POA: Diagnosis not present

## 2023-01-01 LAB — URINALYSIS, ROUTINE W REFLEX MICROSCOPIC
Bilirubin, UA: NEGATIVE
Glucose, UA: NEGATIVE
Ketones, UA: NEGATIVE
Leukocytes,UA: NEGATIVE
Nitrite, UA: NEGATIVE
Protein,UA: NEGATIVE
RBC, UA: NEGATIVE
Specific Gravity, UA: 1.015 (ref 1.005–1.030)
Urobilinogen, Ur: 0.2 mg/dL (ref 0.2–1.0)
pH, UA: 6 (ref 5.0–7.5)

## 2023-01-01 NOTE — Progress Notes (Unsigned)
01/01/2023 12:52 PM   Christopher Conner 1950-11-07 [id number]  Referring provider: Tollie Eth, NP [address] [place],  [place] [address]  Followup urinary frequency and urgency   HPI: [name] is a 72yo here for followup for urinary frequency and urgency. He underwent intravesical botox 2 weeks ago He continue to have urinary frequency every 1-2 hours. He has nocturia 3-4x.    PMH: Past Medical History:  Diagnosis Date   Arthritis    Bilateral foot-drop    after back surgery   BPH (benign prostatic hyperplasia)    Cataract    Chronic lymphocytic leukemia (HCC) early 2018   no current treatment   CLL (chronic lymphocytic leukemia) (HCC)    DM type 1 (diabetes mellitus, type 1) (HCC)    type 1   High cholesterol    Hypertension    Neuromuscular disorder (HCC)    diabetic neuropathy   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    Surgical History: Past Surgical History:  Procedure Laterality Date   APPENDECTOMY  early 20's   BACK SURGERY     BRAIN SURGERY  10/30/04   brain shunt, Codman Hakim   CATARACT EXTRACTION Bilateral    left eye prostheisi "shell"   CYST REMOVAL NECK  yrs ago   CYSTOSCOPY WITH INJECTION [name] 12/17/2022   Procedure: CYSTOSCOPY WITH INJECTION-Botox;  Surgeon: Malen Gauze, MD;  Location: [facility];  Service: Urology;  Laterality: [name];   CYSTOSCOPY WITH INSERTION OF UROLIFT  april/march 2020   done in office   CYSTOSCOPY WITH INSERTION OF UROLIFT [name] 10/12/2019   Procedure: CYSTOSCOPY WITH INSERTION OF UROLIFT;  Surgeon: Malen Gauze, MD;  Location: [facility];  Service: Urology;  Laterality: [name];   EYE SURGERY     LUMBAR FUSION  1998 and 2000   l5 to s1 1998, then l4 and l5 in 2000   SPINE SURGERY     H. Elsner, L4-5 & L5-S1   VENTRICULOPERITONEAL SHUNT  2006    Home Medications:  Allergies as of 01/01/2023       Reactions   Lisinopril Cough   Latex Itching, Rash         Medication List        Accurate as of January 01, 2023 12:52 PM. If you have any questions, ask your nurse or doctor.          ACETYL L-CARNITINE PO Take 500 mg by mouth daily.   acidophilus Caps capsule Take 1 capsule by mouth daily.   Alpha-Lipoic Acid 300 MG Caps Take 300 mg by mouth 2 (two) times daily.   amLODipine 10 MG tablet Commonly known as: NORVASC Take 10 mg by mouth daily.   ASTRAGALUS PO Take 470 mg by mouth daily.   atorvastatin 80 MG tablet Commonly known as: LIPITOR Take 80 mg by mouth every evening.   B-100 COMPLEX CR PO Take 1 tablet by mouth daily.   carvedilol 3.125 MG tablet Commonly known as: COREG Take 3.125 mg by mouth 2 (two) times daily with a meal.   Contour Next Test test strip Generic drug: glucose blood 2 (two) times daily.   CoQ-10 100 MG capsule Take 100 mg by mouth at bedtime.   fludrocortisone 0.1 MG tablet Commonly known as: FLORINEF Take 0.1 mg by mouth every other day.   folic acid 1 MG tablet Commonly known as: FOLVITE Take 1 mg by mouth daily.   GRAPE SEED EXTRACT  PO Take 200 mg by mouth daily.   Green Tea 150 MG Caps Take 150 mg by mouth daily.   levocetirizine 5 MG tablet Commonly known as: XYZAL Take 1 tablet (5 mg total) by mouth every evening. What changed: when to take this   meclizine 25 MG tablet Commonly known as: ANTIVERT Take 1 tablet (25 mg total) by mouth 3 (three) times daily as needed for dizziness.   methotrexate 2.5 MG tablet Commonly known as: RHEUMATREX Take 20 mg by mouth every Thursday.   Methyl-Folate 1700 MCG Caps Generic drug: Levomefolate Glucosamine Take 1,000 mcg by mouth daily.   NovoLOG 100 UNIT/ML injection Generic drug: insulin aspart INJECT UP TO 100 UNITS DAILY MAXIMUM DOSE WITH INSULIN PUMP DAILY   OMEGA 3-6-9 FATTY ACIDS PO Take 1 capsule by mouth 2 (two) times daily.   ondansetron 8 MG disintegrating tablet Commonly known as: Zofran ODT 8mg  ODT q6 hours  prn nausea   REFRESH OP Apply 1-2 drops to eye every 3 (three) hours as needed (dryness).   RESVERATROL PO Take 50 mg by mouth daily.   telmisartan 40 MG tablet Commonly known as: MICARDIS Take 40 mg by mouth daily.   vitamin C 1000 MG tablet Take 1,000 mg by mouth daily.   Vitamin D 50 MCG (2000 UT) Caps Take 2,000 Units by mouth daily.        Allergies:  Allergies  Allergen Reactions   Lisinopril Cough   Latex Itching and Rash    Family History: Family History  Problem Relation Age of Onset   Hypertension Mother    Hyperlipidemia Mother    Diabetes Mother    Diabetes Father     Social History:  reports that he has never smoked. He has never used smokeless tobacco. He reports that he does not drink alcohol and does not use drugs.  ROS: All other review of systems were reviewed and are negative except what is noted above in HPI  Physical Exam: BP (!) 130/57   Pulse 63   Constitutional:  Alert and oriented, No acute distress. HEENT: [place] AT, moist mucus membranes.  Trachea midline, no masses. Cardiovascular: No clubbing, cyanosis, or edema. Respiratory: Normal respiratory effort, no increased work of breathing. GI: Abdomen is soft, nontender, nondistended, no abdominal masses GU: No CVA tenderness.  Lymph: No cervical or inguinal lymphadenopathy. Skin: No rashes, bruises or suspicious lesions. Neurologic: Grossly intact, no focal deficits, moving all 4 extremities. Psychiatric: Normal mood and affect.  Laboratory Data: Lab Results  Component Value Date   WBC 14.8 (H) 12/11/2022   HGB 11.8 (L) 12/11/2022   HCT 35.9 (L) 12/11/2022   MCV 102.3 (H) 12/11/2022   PLT 260 12/11/2022    Lab Results  Component Value Date   CREATININE 0.75 12/11/2022    No results found for: "PSA"  No results found for: "TESTOSTERONE"  Lab Results  Component Value Date   HGBA1C 6.0 (H) 12/11/2022    Urinalysis    Component Value Date/Time   COLORURINE YELLOW  08/16/2015 0244   APPEARANCEUR Clear 11/05/2022 1321   LABSPEC 1.029 08/16/2015 0244   PHURINE 5.5 08/16/2015 0244   GLUCOSEU Negative 11/05/2022 1321   HGBUR NEGATIVE 08/16/2015 0244   BILIRUBINUR Negative 11/05/2022 1321   KETONESUR >80 (A) 08/16/2015 0244   PROTEINUR Negative 11/05/2022 1321   PROTEINUR NEGATIVE 08/16/2015 0244   UROBILINOGEN 0.2 04/09/2014 1947   NITRITE Negative 11/05/2022 1321   NITRITE NEGATIVE 08/16/2015 0244   LEUKOCYTESUR Negative 11/05/2022  1321    Lab Results  Component Value Date   LABMICR Comment 11/05/2022   BACTERIA RARE (A) 08/16/2015    Pertinent Imaging: *** Results for orders placed during the hospital encounter of 05/23/10  DG Abd 1 View  Narrative *RADIOLOGY REPORT*  Clinical Data: Weakness with nausea and vomiting.  ABDOMEN - 1 VIEW  Comparison: 02/10/2010.  Findings: Ventriculoperitoneal shunt tubing extends into the right mid abdomen.  There is no shunt discontinuity.  Moderate stool is noted throughout the colon.  The bowel gas pattern is nonobstructive.  There are postsurgical changes of the lower lumbar spine status post fusion.  IMPRESSION:  1.  Moderate stool throughout the colon suggesting constipation. 2.  Intact ventricular peritoneal shunt.  Original Report Authenticated By: Gerrianne Scale, M.D.  No results found for this or any previous visit.  No results found for this or any previous visit.  No results found for this or any previous visit.  No results found for this or any previous visit.  No valid procedures specified. No results found for this or any previous visit.  No results found for this or any previous visit.   Assessment & Plan:    1. OAB Followup 2 weeks with PVR - Urinalysis, Routine w reflex microscopic - BLADDER SCAN AMB NON-IMAGING  2. Urinary urgency *** - Urinalysis, Routine w reflex microscopic - BLADDER SCAN AMB NON-IMAGING   No follow-ups on file.  Wilkie Aye, MD  [facility] [place]

## 2023-01-01 NOTE — Progress Notes (Unsigned)
post void residual=19

## 2023-01-05 ENCOUNTER — Encounter: Payer: Self-pay | Admitting: Urology

## 2023-01-05 NOTE — Patient Instructions (Signed)

## 2023-01-11 DIAGNOSIS — Z79899 Other long term (current) drug therapy: Secondary | ICD-10-CM | POA: Diagnosis not present

## 2023-01-11 DIAGNOSIS — L4050 Arthropathic psoriasis, unspecified: Secondary | ICD-10-CM | POA: Diagnosis not present

## 2023-01-11 DIAGNOSIS — C9190 Lymphoid leukemia, unspecified not having achieved remission: Secondary | ICD-10-CM | POA: Diagnosis not present

## 2023-01-11 DIAGNOSIS — E109 Type 1 diabetes mellitus without complications: Secondary | ICD-10-CM | POA: Diagnosis not present

## 2023-01-11 DIAGNOSIS — M79646 Pain in unspecified finger(s): Secondary | ICD-10-CM | POA: Diagnosis not present

## 2023-01-11 DIAGNOSIS — M7989 Other specified soft tissue disorders: Secondary | ICD-10-CM | POA: Diagnosis not present

## 2023-01-11 DIAGNOSIS — M1990 Unspecified osteoarthritis, unspecified site: Secondary | ICD-10-CM | POA: Diagnosis not present

## 2023-01-11 DIAGNOSIS — M79643 Pain in unspecified hand: Secondary | ICD-10-CM | POA: Diagnosis not present

## 2023-01-11 DIAGNOSIS — L409 Psoriasis, unspecified: Secondary | ICD-10-CM | POA: Diagnosis not present

## 2023-01-12 ENCOUNTER — Encounter: Payer: Self-pay | Admitting: Urology

## 2023-01-13 ENCOUNTER — Ambulatory Visit (INDEPENDENT_AMBULATORY_CARE_PROVIDER_SITE_OTHER): Payer: Medicare Other | Admitting: Urology

## 2023-01-13 VITALS — BP 126/63 | HR 64

## 2023-01-13 DIAGNOSIS — R3915 Urgency of urination: Secondary | ICD-10-CM | POA: Diagnosis not present

## 2023-01-13 DIAGNOSIS — N401 Enlarged prostate with lower urinary tract symptoms: Secondary | ICD-10-CM

## 2023-01-13 NOTE — Progress Notes (Signed)
01/13/2023 3:30 PM   Christopher Conner 25-Dec-1950 [id number]  Referring provider: Tollie Eth, NP [address] [place],  [place] [address]  Followup OAB   HPI: Christopher Conner is a 72yo here for followup for urinary urgency and OAB. He noted nocturia 3-4x which is unimproved. He underwent intravesical botox 1 month. He has urinary frequency every 90 minutes. He continues to have dribbling.    PMH: Past Medical History:  Diagnosis Date   Arthritis    Bilateral foot-drop    after back surgery   BPH (benign prostatic hyperplasia)    Cataract    Chronic lymphocytic leukemia (HCC) early 2018   no current treatment   CLL (chronic lymphocytic leukemia) (HCC)    DM type 1 (diabetes mellitus, type 1) (HCC)    type 1   High cholesterol    Hypertension    Neuromuscular disorder (HCC)    diabetic neuropathy   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    Surgical History: Past Surgical History:  Procedure Laterality Date   APPENDECTOMY  early 20's   BACK SURGERY     BRAIN SURGERY  10/30/04   brain shunt, Codman Hakim   CATARACT EXTRACTION Bilateral    left eye prostheisi "shell"   CYST REMOVAL NECK  yrs ago   CYSTOSCOPY WITH INJECTION [name] 12/17/2022   Procedure: CYSTOSCOPY WITH INJECTION-Botox;  Surgeon: Malen Gauze, MD;  Location: [facility];  Service: Urology;  Laterality: [name];   CYSTOSCOPY WITH INSERTION OF UROLIFT  april/march 2020   done in office   CYSTOSCOPY WITH INSERTION OF UROLIFT [name] 10/12/2019   Procedure: CYSTOSCOPY WITH INSERTION OF UROLIFT;  Surgeon: Malen Gauze, MD;  Location: [facility];  Service: Urology;  Laterality: [name];   EYE SURGERY     LUMBAR FUSION  1998 and 2000   l5 to s1 1998, then l4 and l5 in 2000   SPINE SURGERY     H. Elsner, L4-5 & L5-S1   VENTRICULOPERITONEAL SHUNT  2006    Home Medications:  Allergies as of 01/13/2023       Reactions   Lisinopril Cough   Latex Itching, Rash         Medication List        Accurate as of January 13, 2023  3:30 PM. If you have any questions, ask your nurse or doctor.          ACETYL L-CARNITINE PO Take 500 mg by mouth daily.   acidophilus Caps capsule Take 1 capsule by mouth daily.   Alpha-Lipoic Acid 300 MG Caps Take 300 mg by mouth 2 (two) times daily.   amLODipine 10 MG tablet Commonly known as: NORVASC Take 10 mg by mouth daily.   ASTRAGALUS PO Take 470 mg by mouth daily.   atorvastatin 80 MG tablet Commonly known as: LIPITOR Take 80 mg by mouth every evening.   B-100 COMPLEX CR PO Take 1 tablet by mouth daily.   carvedilol 3.125 MG tablet Commonly known as: COREG Take 3.125 mg by mouth 2 (two) times daily with a meal.   Contour Next Test test strip Generic drug: glucose blood 2 (two) times daily.   CoQ-10 100 MG capsule Take 100 mg by mouth at bedtime.   fludrocortisone 0.1 MG tablet Commonly known as: FLORINEF Take 0.1 mg by mouth every other day.   folic acid 1 MG tablet Commonly known as: FOLVITE Take 1 mg by mouth daily.  GRAPE SEED EXTRACT PO Take 200 mg by mouth daily.   Green Tea 150 MG Caps Take 150 mg by mouth daily.   levocetirizine 5 MG tablet Commonly known as: XYZAL Take 1 tablet (5 mg total) by mouth every evening. What changed: when to take this   meclizine 25 MG tablet Commonly known as: ANTIVERT Take 1 tablet (25 mg total) by mouth 3 (three) times daily as needed for dizziness.   methotrexate 2.5 MG tablet Commonly known as: RHEUMATREX Take 20 mg by mouth every Thursday.   Methyl-Folate 1700 MCG Caps Generic drug: Levomefolate Glucosamine Take 1,000 mcg by mouth daily.   NovoLOG 100 UNIT/ML injection Generic drug: insulin aspart INJECT UP TO 100 UNITS DAILY MAXIMUM DOSE WITH INSULIN PUMP DAILY   OMEGA 3-6-9 FATTY ACIDS PO Take 1 capsule by mouth 2 (two) times daily.   ondansetron 8 MG disintegrating tablet Commonly known as: Zofran ODT 8mg  ODT q6  hours prn nausea   REFRESH OP Apply 1-2 drops to eye every 3 (three) hours as needed (dryness).   RESVERATROL PO Take 50 mg by mouth daily.   telmisartan 40 MG tablet Commonly known as: MICARDIS Take 40 mg by mouth daily.   vitamin C 1000 MG tablet Take 1,000 mg by mouth daily.   Vitamin D 50 MCG (2000 UT) Caps Take 2,000 Units by mouth daily.        Allergies:  Allergies  Allergen Reactions   Lisinopril Cough   Latex Itching and Rash    Family History: Family History  Problem Relation Age of Onset   Hypertension Mother    Hyperlipidemia Mother    Diabetes Mother    Diabetes Father     Social History:  reports that he has never smoked. He has never used smokeless tobacco. He reports that he does not drink alcohol and does not use drugs.  ROS: All other review of systems were reviewed and are negative except what is noted above in HPI  Physical Exam: BP 126/63   Pulse 64   Constitutional:  Alert and oriented, No acute distress. HEENT: [place] AT, moist mucus membranes.  Trachea midline, no masses. Cardiovascular: No clubbing, cyanosis, or edema. Respiratory: Normal respiratory effort, no increased work of breathing. GI: Abdomen is soft, nontender, nondistended, no abdominal masses GU: No CVA tenderness.  Lymph: No cervical or inguinal lymphadenopathy. Skin: No rashes, bruises or suspicious lesions. Neurologic: Grossly intact, no focal deficits, moving all 4 extremities. Psychiatric: Normal mood and affect.  Laboratory Data: Lab Results  Component Value Date   WBC 14.8 (H) 12/11/2022   HGB 11.8 (L) 12/11/2022   HCT 35.9 (L) 12/11/2022   MCV 102.3 (H) 12/11/2022   PLT 260 12/11/2022    Lab Results  Component Value Date   CREATININE 0.75 12/11/2022    No results found for: "PSA"  No results found for: "TESTOSTERONE"  Lab Results  Component Value Date   HGBA1C 6.0 (H) 12/11/2022    Urinalysis    Component Value Date/Time   COLORURINE YELLOW  08/16/2015 0244   APPEARANCEUR Clear 01/01/2023 1251   LABSPEC 1.029 08/16/2015 0244   PHURINE 5.5 08/16/2015 0244   GLUCOSEU Negative 01/01/2023 1251   HGBUR NEGATIVE 08/16/2015 0244   BILIRUBINUR Negative 01/01/2023 1251   KETONESUR >80 (A) 08/16/2015 0244   PROTEINUR Negative 01/01/2023 1251   PROTEINUR NEGATIVE 08/16/2015 0244   UROBILINOGEN 0.2 04/09/2014 1947   NITRITE Negative 01/01/2023 1251   NITRITE NEGATIVE 08/16/2015 0244   LEUKOCYTESUR  Negative 01/01/2023 1251    Lab Results  Component Value Date   LABMICR Comment 01/01/2023   BACTERIA RARE (A) 08/16/2015    Pertinent Imaging:  Results for orders placed during the hospital encounter of 05/23/10  DG Abd 1 View  Narrative *RADIOLOGY REPORT*  Clinical Data: Weakness with nausea and vomiting.  ABDOMEN - 1 VIEW  Comparison: 02/10/2010.  Findings: Ventriculoperitoneal shunt tubing extends into the right mid abdomen.  There is no shunt discontinuity.  Moderate stool is noted throughout the colon.  The bowel gas pattern is nonobstructive.  There are postsurgical changes of the lower lumbar spine status post fusion.  IMPRESSION:  1.  Moderate stool throughout the colon suggesting constipation. 2.  Intact ventricular peritoneal shunt.  Original Report Authenticated By: Gerrianne Scale, M.D.  No results found for this or any previous visit.  No results found for this or any previous visit.  No results found for this or any previous visit.  No results found for this or any previous visit.  No valid procedures specified. No results found for this or any previous visit.  No results found for this or any previous visit.   Assessment & Plan:    1. Benign localized prostatic hyperplasia with lower urinary tract symptoms (LUTS) -continue observation - Urinalysis, Routine w reflex microscopic  2. Urinary urgency -continue observation   No follow-ups on file.  Wilkie Aye, MD  [facility]  [facility] [place]

## 2023-01-14 LAB — URINALYSIS, ROUTINE W REFLEX MICROSCOPIC
Bilirubin, UA: NEGATIVE
Glucose, UA: NEGATIVE
Ketones, UA: NEGATIVE
Leukocytes,UA: NEGATIVE
Nitrite, UA: NEGATIVE
Protein,UA: NEGATIVE
RBC, UA: NEGATIVE
Specific Gravity, UA: 1.015 (ref 1.005–1.030)
Urobilinogen, Ur: 0.2 mg/dL (ref 0.2–1.0)
pH, UA: 6 (ref 5.0–7.5)

## 2023-01-17 ENCOUNTER — Encounter: Payer: Self-pay | Admitting: Urology

## 2023-01-17 NOTE — Patient Instructions (Signed)

## 2023-01-26 ENCOUNTER — Encounter: Payer: Self-pay | Admitting: Nurse Practitioner

## 2023-01-29 ENCOUNTER — Ambulatory Visit: Payer: Medicare Other | Admitting: Urology

## 2023-01-29 ENCOUNTER — Encounter: Payer: Self-pay | Admitting: Urology

## 2023-01-29 ENCOUNTER — Telehealth: Payer: Self-pay

## 2023-01-29 VITALS — BP 102/60 | HR 64

## 2023-01-29 DIAGNOSIS — N401 Enlarged prostate with lower urinary tract symptoms: Secondary | ICD-10-CM

## 2023-01-29 DIAGNOSIS — R3915 Urgency of urination: Secondary | ICD-10-CM | POA: Diagnosis not present

## 2023-01-29 DIAGNOSIS — R350 Frequency of micturition: Secondary | ICD-10-CM

## 2023-01-29 DIAGNOSIS — Z23 Encounter for immunization: Secondary | ICD-10-CM | POA: Diagnosis not present

## 2023-01-29 LAB — BLADDER SCAN AMB NON-IMAGING: Scan Result: 69

## 2023-01-29 LAB — URINALYSIS, ROUTINE W REFLEX MICROSCOPIC
Bilirubin, UA: NEGATIVE
Glucose, UA: NEGATIVE
Ketones, UA: NEGATIVE
Leukocytes,UA: NEGATIVE
Nitrite, UA: NEGATIVE
Protein,UA: NEGATIVE
RBC, UA: NEGATIVE
Specific Gravity, UA: 1.015 (ref 1.005–1.030)
Urobilinogen, Ur: 0.2 mg/dL (ref 0.2–1.0)
pH, UA: 6.5 (ref 5.0–7.5)

## 2023-01-29 MED ORDER — SILODOSIN 8 MG PO CAPS: 8.0000 mg | ORAL_CAPSULE | Freq: Every evening | ORAL | 11 refills | Status: DC

## 2023-01-29 NOTE — Patient Instructions (Signed)

## 2023-01-29 NOTE — Progress Notes (Signed)
post void residual=69 

## 2023-01-29 NOTE — Progress Notes (Signed)
01/29/2023 10:49 AM   Christopher Conner 03-Feb-1951 [id number]  Referring provider: Tollie Eth, NP [address] [place],  [place] [address]  Followup OAB and BPH   HPI: Mr Arco is a 72yo here for followup for OAB and BPH. Since last visit he has noticed the urinary frequency and urgency has worsened. Last week he was having urinary frequency every 30 minutes day and night. He has started limiting his fluids due to urinary urgency. He has occasional urge incontinence. PVR 69cc.   PMH: Past Medical History:  Diagnosis Date   Arthritis    Bilateral foot-drop    after back surgery   BPH (benign prostatic hyperplasia)    Cataract    Chronic lymphocytic leukemia (HCC) early 2018   no current treatment   CLL (chronic lymphocytic leukemia) (HCC)    DM type 1 (diabetes mellitus, type 1) (HCC)    type 1   High cholesterol    Hypertension    Neuromuscular disorder (HCC)    diabetic neuropathy   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    Surgical History: Past Surgical History:  Procedure Laterality Date   APPENDECTOMY  early 20's   BACK SURGERY     BRAIN SURGERY  10/30/04   brain shunt, Codman Hakim   CATARACT EXTRACTION Bilateral    left eye prostheisi "shell"   CYST REMOVAL NECK  yrs ago   CYSTOSCOPY WITH INJECTION [name] 12/17/2022   Procedure: CYSTOSCOPY WITH INJECTION-Botox;  Surgeon: Malen Gauze, MD;  Location: [facility];  Service: Urology;  Laterality: [name];   CYSTOSCOPY WITH INSERTION OF UROLIFT  april/march 2020   done in office   CYSTOSCOPY WITH INSERTION OF UROLIFT [name] 10/12/2019   Procedure: CYSTOSCOPY WITH INSERTION OF UROLIFT;  Surgeon: Malen Gauze, MD;  Location: [facility];  Service: Urology;  Laterality: [name];   EYE SURGERY     LUMBAR FUSION  1998 and 2000   l5 to s1 1998, then l4 and l5 in 2000   SPINE SURGERY     H. Elsner, L4-5 & L5-S1   VENTRICULOPERITONEAL SHUNT  2006    Home Medications:   Allergies as of 01/29/2023       Reactions   Lisinopril Cough   Latex Itching, Rash        Medication List        Accurate as of January 29, 2023 10:49 AM. If you have any questions, ask your nurse or doctor.          ACETYL L-CARNITINE PO Take 500 mg by mouth daily.   acidophilus Caps capsule Take 1 capsule by mouth daily.   Alpha-Lipoic Acid 300 MG Caps Take 300 mg by mouth 2 (two) times daily.   amLODipine 10 MG tablet Commonly known as: NORVASC Take 10 mg by mouth daily.   ASTRAGALUS PO Take 470 mg by mouth daily.   atorvastatin 80 MG tablet Commonly known as: LIPITOR Take 80 mg by mouth every evening.   B-100 COMPLEX CR PO Take 1 tablet by mouth daily.   carvedilol 3.125 MG tablet Commonly known as: COREG Take 3.125 mg by mouth 2 (two) times daily with a meal.   Contour Next Test test strip Generic drug: glucose blood 2 (two) times daily.   CoQ-10 100 MG capsule Take 100 mg by mouth at bedtime.   fludrocortisone 0.1 MG tablet Commonly known as: FLORINEF Take 0.1 mg by mouth every other day.  folic acid 1 MG tablet Commonly known as: FOLVITE Take 1 mg by mouth daily.   GRAPE SEED EXTRACT PO Take 200 mg by mouth daily.   Green Tea 150 MG Caps Take 150 mg by mouth daily.   levocetirizine 5 MG tablet Commonly known as: XYZAL Take 1 tablet (5 mg total) by mouth every evening. What changed: when to take this   meclizine 25 MG tablet Commonly known as: ANTIVERT Take 1 tablet (25 mg total) by mouth 3 (three) times daily as needed for dizziness.   methotrexate 2.5 MG tablet Commonly known as: RHEUMATREX Take 20 mg by mouth every Thursday.   Methyl-Folate 1700 MCG Caps Generic drug: Levomefolate Glucosamine Take 1,000 mcg by mouth daily.   NovoLOG 100 UNIT/ML injection Generic drug: insulin aspart INJECT UP TO 100 UNITS DAILY MAXIMUM DOSE WITH INSULIN PUMP DAILY   OMEGA 3-6-9 FATTY ACIDS PO Take 1 capsule by mouth 2 (two) times  daily.   ondansetron 8 MG disintegrating tablet Commonly known as: Zofran ODT 8mg  ODT q6 hours prn nausea   REFRESH OP Apply 1-2 drops to eye every 3 (three) hours as needed (dryness).   RESVERATROL PO Take 50 mg by mouth daily.   telmisartan 40 MG tablet Commonly known as: MICARDIS Take 40 mg by mouth daily.   vitamin C 1000 MG tablet Take 1,000 mg by mouth daily.   Vitamin D 50 MCG (2000 UT) Caps Take 2,000 Units by mouth daily.        Allergies:  Allergies  Allergen Reactions   Lisinopril Cough   Latex Itching and Rash    Family History: Family History  Problem Relation Age of Onset   Hypertension Mother    Hyperlipidemia Mother    Diabetes Mother    Diabetes Father     Social History:  reports that he has never smoked. He has never used smokeless tobacco. He reports that he does not drink alcohol and does not use drugs.  ROS: All other review of systems were reviewed and are negative except what is noted above in HPI  Physical Exam: BP 102/60   Pulse 64   Constitutional:  Alert and oriented, No acute distress. HEENT: [place] AT, moist mucus membranes.  Trachea midline, no masses. Cardiovascular: No clubbing, cyanosis, or edema. Respiratory: Normal respiratory effort, no increased work of breathing. GI: Abdomen is soft, nontender, nondistended, no abdominal masses GU: No CVA tenderness.  Lymph: No cervical or inguinal lymphadenopathy. Skin: No rashes, bruises or suspicious lesions. Neurologic: Grossly intact, no focal deficits, moving all 4 extremities. Psychiatric: Normal mood and affect.  Laboratory Data: Lab Results  Component Value Date   WBC 14.8 (H) 12/11/2022   HGB 11.8 (L) 12/11/2022   HCT 35.9 (L) 12/11/2022   MCV 102.3 (H) 12/11/2022   PLT 260 12/11/2022    Lab Results  Component Value Date   CREATININE 0.75 12/11/2022    No results found for: "PSA"  No results found for: "TESTOSTERONE"  Lab Results  Component Value Date    HGBA1C 6.0 (H) 12/11/2022    Urinalysis    Component Value Date/Time   COLORURINE YELLOW 08/16/2015 0244   APPEARANCEUR Clear 01/13/2023 1515   LABSPEC 1.029 08/16/2015 0244   PHURINE 5.5 08/16/2015 0244   GLUCOSEU Negative 01/13/2023 1515   HGBUR NEGATIVE 08/16/2015 0244   BILIRUBINUR Negative 01/13/2023 1515   KETONESUR >80 (A) 08/16/2015 0244   PROTEINUR Negative 01/13/2023 1515   PROTEINUR NEGATIVE 08/16/2015 0244   UROBILINOGEN 0.2  04/09/2014 1947   NITRITE Negative 01/13/2023 1515   NITRITE NEGATIVE 08/16/2015 0244   LEUKOCYTESUR Negative 01/13/2023 1515    Lab Results  Component Value Date   LABMICR Comment 01/13/2023   BACTERIA RARE (A) 08/16/2015    Pertinent Imaging:  Results for orders placed during the hospital encounter of 05/23/10  DG Abd 1 View  Narrative *RADIOLOGY REPORT*  Clinical Data: Weakness with nausea and vomiting.  ABDOMEN - 1 VIEW  Comparison: 02/10/2010.  Findings: Ventriculoperitoneal shunt tubing extends into the right mid abdomen.  There is no shunt discontinuity.  Moderate stool is noted throughout the colon.  The bowel gas pattern is nonobstructive.  There are postsurgical changes of the lower lumbar spine status post fusion.  IMPRESSION:  1.  Moderate stool throughout the colon suggesting constipation. 2.  Intact ventricular peritoneal shunt.  Original Report Authenticated By: Gerrianne Scale, M.D.  No results found for this or any previous visit.  No results found for this or any previous visit.  No results found for this or any previous visit.  No results found for this or any previous visit.  No valid procedures specified. No results found for this or any previous visit.  No results found for this or any previous visit.   Assessment & Plan:    1. Benign localized prostatic hyperplasia with lower urinary tract symptoms (LUTS) -we will trial rapaflo 8mg  qhs - Urinalysis, Routine w reflex microscopic -  BLADDER SCAN AMB NON-IMAGING  2. Urinary urgency -We will trial rapaflo 8mg  daily  3. Urinary frequency -rapaflo 8mg  daily   No follow-ups on file.  Wilkie Aye, MD  [facility] [place]

## 2023-01-29 NOTE — Telephone Encounter (Signed)
Patient was told by pharmacy needing a prior authorization for medication to be filled. Asking if this can be done today to start taking medication today.

## 2023-01-29 NOTE — Telephone Encounter (Addendum)
PA started for Rapaflo Key: (Key: [id number])

## 2023-02-05 ENCOUNTER — Ambulatory Visit: Payer: Medicare Other | Admitting: Urology

## 2023-02-09 ENCOUNTER — Encounter: Payer: Self-pay | Admitting: Nurse Practitioner

## 2023-02-10 ENCOUNTER — Encounter: Payer: Self-pay | Admitting: Urology

## 2023-02-11 DIAGNOSIS — G912 (Idiopathic) normal pressure hydrocephalus: Secondary | ICD-10-CM | POA: Diagnosis not present

## 2023-02-11 DIAGNOSIS — E871 Hypo-osmolality and hyponatremia: Secondary | ICD-10-CM | POA: Diagnosis not present

## 2023-02-11 DIAGNOSIS — I10 Essential (primary) hypertension: Secondary | ICD-10-CM | POA: Diagnosis not present

## 2023-02-11 DIAGNOSIS — L4050 Arthropathic psoriasis, unspecified: Secondary | ICD-10-CM | POA: Diagnosis not present

## 2023-02-11 DIAGNOSIS — E7800 Pure hypercholesterolemia, unspecified: Secondary | ICD-10-CM | POA: Diagnosis not present

## 2023-02-11 DIAGNOSIS — I700 Atherosclerosis of aorta: Secondary | ICD-10-CM | POA: Diagnosis not present

## 2023-02-11 DIAGNOSIS — C9190 Lymphoid leukemia, unspecified not having achieved remission: Secondary | ICD-10-CM | POA: Diagnosis not present

## 2023-02-11 DIAGNOSIS — E1021 Type 1 diabetes mellitus with diabetic nephropathy: Secondary | ICD-10-CM | POA: Diagnosis not present

## 2023-02-11 DIAGNOSIS — M1990 Unspecified osteoarthritis, unspecified site: Secondary | ICD-10-CM | POA: Diagnosis not present

## 2023-02-26 ENCOUNTER — Ambulatory Visit (INDEPENDENT_AMBULATORY_CARE_PROVIDER_SITE_OTHER): Payer: Medicare Other | Admitting: Urology

## 2023-02-26 VITALS — BP 102/55 | HR 59

## 2023-02-26 DIAGNOSIS — N401 Enlarged prostate with lower urinary tract symptoms: Secondary | ICD-10-CM | POA: Diagnosis not present

## 2023-02-26 DIAGNOSIS — R351 Nocturia: Secondary | ICD-10-CM | POA: Diagnosis not present

## 2023-02-26 DIAGNOSIS — R3915 Urgency of urination: Secondary | ICD-10-CM

## 2023-02-26 LAB — URINALYSIS, ROUTINE W REFLEX MICROSCOPIC
Bilirubin, UA: NEGATIVE
Glucose, UA: NEGATIVE
Ketones, UA: NEGATIVE
Leukocytes,UA: NEGATIVE
Nitrite, UA: NEGATIVE
Protein,UA: NEGATIVE
RBC, UA: NEGATIVE
Specific Gravity, UA: 1.01 (ref 1.005–1.030)
Urobilinogen, Ur: 0.2 mg/dL (ref 0.2–1.0)
pH, UA: 7.5 (ref 5.0–7.5)

## 2023-02-26 NOTE — Progress Notes (Unsigned)
02/26/2023 10:57 AM   Christopher Conner [birth year] [id number]  Referring provider: Tollie Eth, NP [address] [place],  [place] [address]  Followup BPH   HPI: Christopher Conner is a 72yo here for followup for BPH with urinary urgency, nocturia, and urinary frequency. He was started on rapaflo 8mg  daily last visit which improved his urinary frequency for 1.25 hours to 1.5-2 hours. He notes the urination is gets worse after 14-16 hours.  The urinary urgency is unchanged. IPSS 18 QOL 6 on rapalfo 8mg .    PMH: Past Medical History:  Diagnosis Date   Arthritis    Bilateral foot-drop    after back surgery   BPH (benign prostatic hyperplasia)    Cataract    Chronic lymphocytic leukemia (HCC) early 2018   no current treatment   CLL (chronic lymphocytic leukemia) (HCC)    DM type 1 (diabetes mellitus, type 1) (HCC)    type 1   High cholesterol    Hypertension    Neuromuscular disorder (HCC)    diabetic neuropathy   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    Surgical History: Past Surgical History:  Procedure Laterality Date   APPENDECTOMY  early 20's   BACK SURGERY     BRAIN SURGERY  10/30/04   brain shunt, Codman Hakim   CATARACT EXTRACTION Bilateral    left eye prostheisi "shell"   CYST REMOVAL NECK  yrs ago   CYSTOSCOPY WITH INJECTION [name] 12/17/2022   Procedure: CYSTOSCOPY WITH INJECTION-Botox;  Surgeon: Malen Gauze, MD;  Location: [facility];  Service: Urology;  Laterality: [name];   CYSTOSCOPY WITH INSERTION OF UROLIFT  april/march 2020   done in office   CYSTOSCOPY WITH INSERTION OF UROLIFT [name] 10/12/2019   Procedure: CYSTOSCOPY WITH INSERTION OF UROLIFT;  Surgeon: Malen Gauze, MD;  [facility];  Service: Urology;  Laterality: [name];   EYE SURGERY     LUMBAR FUSION  1998 and 2000   l5 to s1 1998, then l4 and l5 in 2000   SPINE SURGERY     H. Elsner, L4-5 & L5-S1   VENTRICULOPERITONEAL SHUNT  2006    Home  Medications:  Allergies as of 02/26/2023       Reactions   Lisinopril Cough   Latex Itching, Rash        Medication List        Accurate as of February 26, 2023 10:57 AM. If you have any questions, ask your nurse or doctor.          ACETYL L-CARNITINE PO Take 500 mg by mouth daily.   acidophilus Caps capsule Take 1 capsule by mouth daily.   Alpha-Lipoic Acid 300 MG Caps Take 300 mg by mouth 2 (two) times daily.   amLODipine 10 MG tablet Commonly known as: NORVASC Take 10 mg by mouth daily.   ASTRAGALUS PO Take 470 mg by mouth daily.   atorvastatin 80 MG tablet Commonly known as: LIPITOR Take 80 mg by mouth every evening.   B-100 COMPLEX CR PO Take 1 tablet by mouth daily.   carvedilol 3.125 MG tablet Commonly known as: COREG Take 3.125 mg by mouth 2 (two) times daily with a meal.   Contour Next Test test strip Generic drug: glucose blood 2 (two) times daily.   CoQ-10 100 MG capsule Take 100 mg by mouth at bedtime.   fludrocortisone 0.1 MG tablet Commonly known as: FLORINEF Take 0.1 mg by mouth every  other day.   folic acid 1 MG tablet Commonly known as: FOLVITE Take 1 mg by mouth daily.   GRAPE SEED EXTRACT PO Take 200 mg by mouth daily.   Green Tea 150 MG Caps Take 150 mg by mouth daily.   levocetirizine 5 MG tablet Commonly known as: XYZAL Take 1 tablet (5 mg total) by mouth every evening. What changed: when to take this   meclizine 25 MG tablet Commonly known as: ANTIVERT Take 1 tablet (25 mg total) by mouth 3 (three) times daily as needed for dizziness.   methotrexate 2.5 MG tablet Commonly known as: RHEUMATREX Take 20 mg by mouth every Thursday.   Methyl-Folate 1700 MCG Caps Generic drug: Levomefolate Glucosamine Take 1,000 mcg by mouth daily.   NovoLOG 100 UNIT/ML injection Generic drug: insulin aspart INJECT UP TO 100 UNITS DAILY MAXIMUM DOSE WITH INSULIN PUMP DAILY   OMEGA 3-6-9 FATTY ACIDS PO Take 1 capsule by mouth 2  (two) times daily.   ondansetron 8 MG disintegrating tablet Commonly known as: Zofran ODT 8mg  ODT q6 hours prn nausea   REFRESH OP Apply 1-2 drops to eye every 3 (three) hours as needed (dryness).   RESVERATROL PO Take 50 mg by mouth daily.   silodosin 8 MG Caps capsule Commonly known as: RAPAFLO Take 1 capsule (8 mg total) by mouth at bedtime.   telmisartan 40 MG tablet Commonly known as: MICARDIS Take 40 mg by mouth daily.   vitamin C 1000 MG tablet Take 1,000 mg by mouth daily.   Vitamin D 50 MCG (2000 UT) Caps Take 2,000 Units by mouth daily.        Allergies:  Allergies  Allergen Reactions   Lisinopril Cough   Latex Itching and Rash    Family History: Family History  Problem Relation Age of Onset   Hypertension Mother    Hyperlipidemia Mother    Diabetes Mother    Diabetes Father     Social History:  reports that he has never smoked. He has never used smokeless tobacco. He reports that he does not drink alcohol and does not use drugs.  ROS: All other review of systems were reviewed and are negative except what is noted above in HPI  Physical Exam: BP (!) 102/55   Pulse (!) 59   Constitutional:  Alert and oriented, No acute distress. HEENT: [place] AT, moist mucus membranes.  Trachea midline, no masses. Cardiovascular: No clubbing, cyanosis, or edema. Respiratory: Normal respiratory effort, no increased work of breathing. GI: Abdomen is soft, nontender, nondistended, no abdominal masses GU: No CVA tenderness.  Lymph: No cervical or inguinal lymphadenopathy. Skin: No rashes, bruises or suspicious lesions. Neurologic: Grossly intact, no focal deficits, moving all 4 extremities. Psychiatric: Normal mood and affect.  Laboratory Data: Lab Results  Component Value Date   WBC 14.8 (H) 12/11/2022   HGB 11.8 (L) 12/11/2022   HCT 35.9 (L) 12/11/2022   MCV 102.3 (H) 12/11/2022   PLT 260 12/11/2022    Lab Results  Component Value Date   CREATININE 0.75  12/11/2022    No results found for: "PSA"  No results found for: "TESTOSTERONE"  Lab Results  Component Value Date   HGBA1C 6.0 (H) 12/11/2022    Urinalysis    Component Value Date/Time   COLORURINE YELLOW 08/16/2015 0244   APPEARANCEUR Clear 01/29/2023 1024   LABSPEC 1.029 08/16/2015 0244   PHURINE 5.5 08/16/2015 0244   GLUCOSEU Negative 01/29/2023 1024   HGBUR NEGATIVE 08/16/2015 0244  BILIRUBINUR Negative 01/29/2023 1024   KETONESUR >80 (A) 08/16/2015 0244   PROTEINUR Negative 01/29/2023 1024   PROTEINUR NEGATIVE 08/16/2015 0244   UROBILINOGEN 0.2 04/09/2014 1947   NITRITE Negative 01/29/2023 1024   NITRITE NEGATIVE 08/16/2015 0244   LEUKOCYTESUR Negative 01/29/2023 1024    Lab Results  Component Value Date   LABMICR Comment 01/29/2023   BACTERIA RARE (A) 08/16/2015    Pertinent Imaging:  Results for orders placed during the hospital encounter of 05/23/10  DG Abd 1 View  Narrative *RADIOLOGY REPORT*  Clinical Data: Weakness with nausea and vomiting.  ABDOMEN - 1 VIEW  Comparison: 02/10/2010.  Findings: Ventriculoperitoneal shunt tubing extends into the right mid abdomen.  There is no shunt discontinuity.  Moderate stool is noted throughout the colon.  The bowel gas pattern is nonobstructive.  There are postsurgical changes of the lower lumbar spine status post fusion.  IMPRESSION:  1.  Moderate stool throughout the colon suggesting constipation. 2.  Intact ventricular peritoneal shunt.  Original Report Authenticated By: Gerrianne Scale, M.D.  No results found for this or any previous visit.  No results found for this or any previous visit.  No results found for this or any previous visit.  No results found for this or any previous visit.  No valid procedures specified. No results found for this or any previous visit.  No results found for this or any previous visit.   Assessment & Plan:    1. Benign localized prostatic hyperplasia  with lower urinary tract symptoms (LUTS) We discussed the management of his BPH including continued medical therapy, Rezum, Urolift, TURP and simple prostatectomy. After discussing the options the patient has elected to proceed with TURP. Risks/benefits/alternatives discussed.  - Urinalysis, Routine w reflex microscopic  2. Urinary urgency -continue rapaflo 8mg   3. Nocturia Continue rapaflo 8mg    No follow-ups on file.  Wilkie Aye, MD  [facility] [place]

## 2023-02-28 ENCOUNTER — Encounter: Payer: Self-pay | Admitting: Urology

## 2023-02-28 NOTE — Patient Instructions (Signed)
Transurethral Resection of the Prostate Transurethral resection of the prostate (TURP) is the removal, or resection, of part of the prostate tissue. This procedure is done to treat an enlarged prostate gland (benign prostatic hyperplasia). The goal of TURP is to remove enough prostate tissue to allow for a normal flow of urine. The procedure will allow you to empty your bladder more completely when you urinate so that you can urinate less often. In a transurethral resection, a thin telescope with a light, a camera, and an electric cutting edge (resectoscope) is passed through the urethra and into the prostate. The opening of the urethra is at the end of the penis. Tell a health care provider about: Any allergies you have. All medicines you are taking, including vitamins, herbs, eye drops, creams, and over-the-counter medicines. Any problems you or family members have had with anesthetic medicines. Any bleeding problems you have. Any surgeries you have had. Any medical conditions you have. Any prostate infections you have had. What are the risks? Generally, this is a safe procedure. However, problems may occur, including: Infection. Bleeding. Allergic reactions to medicines. Blood in the urine (hematuria). Damage to nearby structures or organs. Other problems may occur, but they are rare. They include: Dry ejaculation, or having no semen come out during orgasm. Erectile dysfunction, or being unable to have or keep an erection. Scarring that leads to narrowing of the urethra. This narrowing may block the flow of urine. Inability to control when you urinate (incontinence). Deep vein thrombosis. This is a blood clot that can develop in your leg. TURP syndrome. This can happen when you lose too much sodium during or after the procedure. Some signs and symptoms of this condition include: Weakness. Headaches. Nausea or vomiting. Muscle cramping. What happens before the procedure? When to stop  eating and drinking Follow instructions from your health care provider about what you may eat and drink before your procedure. These may include: 8 hours before your procedure Stop eating most foods. Do not eat meat, fried foods, or fatty foods. Eat only light foods, such as toast or crackers. All liquids are okay except energy drinks and alcohol. 6 hours before your procedure Stop eating. Drink only clear liquids, such as water, clear fruit juice, black coffee, plain tea, and sports drinks. Do not drink energy drinks or alcohol. 2 hours before your procedure Stop drinking all liquids. You may be allowed to take medicines with small sips of water. If you do not follow your health care provider's instructions, your procedure may be delayed or canceled. Medicines Ask your health care provider about: Changing or stopping your regular medicines. This is especially important if you are taking diabetes medicines or blood thinners. Taking medicines such as aspirin and ibuprofen. These medicines can thin your blood. Do not take these medicines unless your health care provider tells you to take them. Taking over-the-counter medicines, vitamins, herbs, and supplements. Surgery safety Ask your health care provider what steps will be taken to help prevent infection. These steps may include: Removing hair at the surgery site. Washing skin with a germ-killing soap. Taking antibiotic medicine. General instructions Do not use any products that contain nicotine or tobacco for at least 4 weeks before the procedure. These products include cigarettes, chewing tobacco, and vaping devices, such as e-cigarettes. If you need help quitting, ask your health care provider. If you will be going home right after the procedure, plan to have a responsible adult: Take you home from the hospital or clinic. You  will not be allowed to drive. Care for you for the time you are told. What happens during the procedure?  An  IV will be inserted into one of your veins. You will be given one or more of the following: A medicine to help you relax (sedative). A medicine to make you fall asleep (general anesthetic). A medicine that is injected into your spine to numb the area below and slightly above the injection site (spinal anesthetic). Your legs will be placed in foot rests (stirrups) so that your legs are apart and your knees are bent. The resectoscope will be passed through your urethra to your prostate. Parts of your prostate will be resected using the cutting edge of the resectoscope. Fluid will be passed to rinse out the cut tissues (irrigation). The resectoscope will be removed. A small, thin tube (catheter) will be passed through your urethra and into your bladder. The catheter will drain urine into a bag outside of your body. The procedure may vary among health care providers and hospitals. What happens after the procedure? Your blood pressure, heart rate, breathing rate, and blood oxygen level will be monitored until you leave the hospital or clinic. You will be given fluids through the IV. The IV will be removed when you start eating and drinking normally. You may have some pain. Pain medicine will be available to help you. You will have a catheter draining your urine. You may have blood in your urine. Your catheter may be kept in until your urine is clear. Your urinary drainage will be monitored. If necessary, your bladder may be rinsed out (irrigated) through your catheter. You will be encouraged to walk around as soon as possible. You may have to wear compression stockings. These stockings help to prevent blood clots and reduce swelling in your legs. If you were given a sedative during the procedure, it can affect you for several hours. Do not drive or operate machinery until your health care provider says that it is safe. Summary Transurethral resection of the prostate (TURP) is the removal  (resection) of part of the prostate tissue. The goal of this procedure is to remove enough prostate tissue to allow for a normal flow of urine. Follow instructions from your health care provider about taking medicines and about eating and drinking before the procedure. This information is not intended to replace advice given to you by your health care provider. Make sure you discuss any questions you have with your health care provider. Document Revised: 12/31/2020 Document Reviewed: 12/31/2020 [employer] Patient Education  2024 [employer].

## 2023-03-04 NOTE — Telephone Encounter (Signed)
Please see patient question about urolift

## 2023-03-05 DIAGNOSIS — E871 Hypo-osmolality and hyponatremia: Secondary | ICD-10-CM | POA: Diagnosis not present

## 2023-03-11 DIAGNOSIS — E7800 Pure hypercholesterolemia, unspecified: Secondary | ICD-10-CM | POA: Diagnosis not present

## 2023-03-11 DIAGNOSIS — Z9641 Presence of insulin pump (external) (internal): Secondary | ICD-10-CM | POA: Diagnosis not present

## 2023-03-11 DIAGNOSIS — E871 Hypo-osmolality and hyponatremia: Secondary | ICD-10-CM | POA: Diagnosis not present

## 2023-03-11 DIAGNOSIS — E1021 Type 1 diabetes mellitus with diabetic nephropathy: Secondary | ICD-10-CM | POA: Diagnosis not present

## 2023-03-11 DIAGNOSIS — I10 Essential (primary) hypertension: Secondary | ICD-10-CM | POA: Diagnosis not present

## 2023-03-29 ENCOUNTER — Encounter: Payer: Self-pay | Admitting: Urology

## 2023-04-09 ENCOUNTER — Encounter: Payer: Self-pay | Admitting: Nurse Practitioner

## 2023-04-09 NOTE — Telephone Encounter (Signed)
 Care team updated and letter sent for eye exam notes.

## 2023-04-15 NOTE — Patient Instructions (Addendum)
Christopher Conner  04/15/2023     @PREFPERIOPPHARMACY @   Your procedure is scheduled on  04/22/2023.    Report to [facility] at  0600  A.M.   Call this number if you have problems the morning of surgery:  [phone]  If you experience any cold or flu symptoms such as cough, fever, chills, shortness of breath, etc. between now and your scheduled surgery, please notify us at the above number.   Remember:     Dial your insulin pump dow to your basal rate at bedtime.    Do not eat after midnight.   You may drink clear liquids until 0330 am on 04/22/2023.    Clear liquids allowed are:                    Water, Juice (No red color; non-citric and without pulp; diabetics please choose diet or no sugar options), Carbonated beverages (diabetics please choose diet or no sugar options), Clear Tea (No creamer, milk, or cream, including half & half and powdered creamer), Black Coffee Only (No creamer, milk or cream, including half & half and powdered creamer), and Clear Sports drink (No red color; diabetics please choose diet or no sugar options)    Take these medicines the morning of surgery with A SIP OF WATER            amlodipine, carvedilol, meclizine, methotrexate, telmisartan.    Do not wear jewelry, make-up or nail polish, including gel polish,  artificial nails, or any other type of covering on natural nails (fingers and  toes).  Do not wear lotions, powders, or perfumes, or deodorant.  Do not shave 48 hours prior to surgery.  Men may shave face and neck.  Do not bring valuables to the hospital.  [facility] is not responsible for any belongings or valuables.  Contacts, dentures or bridgework may not be worn into surgery.  Leave your suitcase in the car.  After surgery it may be brought to your room.  For patients admitted to the hospital, discharge time will be determined by your treatment team.  Patients discharged the day of surgery will not be allowed to drive home  and must have someone with them for 24 hours.    Special instructions:   DO NOT smoke tobacco or vape for 24 hours before your procedure.  Please read over the following fact sheets that you were given. Coughing and Deep Breathing, Surgical Site Infection Prevention, Anesthesia Post-op Instructions, and Care and Recovery After Surgery       Transurethral Resection of the Prostate, Care After The following information offers guidance on how to care for yourself after your procedure. Your health care provider may also give you more specific instructions. If you have problems or questions, contact your health care provider. What can I expect after the procedure? After the procedure, it is common to have: Mild pain in your lower abdomen. Soreness or mild discomfort in your penis or when you urinate. This is from having the catheter inserted during the procedure. A sudden urge to urinate (urgency). A need to urinate often. A small amount of blood in your urine. You may notice some small blood clots in your urine. These are normal. Follow these instructions at home: Medicines Take over-the-counter and prescription medicines only as told by your health care provider. If you were prescribed an antibiotic medicine, take it as told by your health care provider. Do  not stop taking the antibiotic even if you start to feel better. Activity  Rest as told by your health care provider. Avoid sitting for a long time without moving. Get up to take short walks every 1-2 hours. This is important to improve blood flow and breathing. Ask for help if you feel weak or unsteady. You may increase your physical activity gradually as you start to feel better. Do not drive or operate machinery until your health care provider says that it is safe. Do not ride in a car for long periods of time, or as told by your health care provider. Avoid intense physical activity for as long as told by your health care  provider. Do not lift anything that is heavier than 10 lb (4.5 kg), or the limit that you are told, until your health care provider says that it is safe. Do not have sex until your health care provider approves. Return to your normal activities as told by your health care provider. Ask your health care provider what activities are safe for you. Preventing constipation You may need to take these actions to prevent or treat constipation: Drink enough fluid to keep your urine pale yellow. Take over-the-counter or prescription medicines. Eat foods that are high in fiber, such as beans, whole grains, and fresh fruits and vegetables. Limit foods that are high in fat and processed sugars, such as fried or sweet foods.  General instructions Do not strain when you have a bowel movement. Straining may lead to bleeding from the prostate. This may cause blood clots and trouble urinating. Do not use any products that contain nicotine or tobacco. These products include cigarettes, chewing tobacco, and vaping devices, such as e-cigarettes. If you need help quitting, ask your health care provider. If you go home with a tube draining your urine (urinary catheter), care for the catheter as told by your health care provider. Wear compression stockings as told by your health care provider. These stockings help to prevent blood clots and reduce swelling in your legs. Keep all follow-up visits. This is important. Contact a health care provider if: You have signs of infection, such as: Fever or chills. Urine that smells very bad. Swelling around your urethra that is getting worse. Swelling in your penis or testicles. You have difficulty urinating. You have pain that gets worse or does not improve with medicine. You have blood in your urine that does not go away after 1 week of resting and drinking more fluids. You have trouble having a bowel movement. You have trouble having or keeping an erection. No semen  comes out during orgasm (dry ejaculation). You have a urinary catheter in place, and you have: Spasms or pain. Problems with your catheter or your catheter is blocked. Get help right away if: You are unable to urinate. You are having more blood clots in your urine instead of fewer. You have: Large blood clots. A lot of blood in your urine. Pain in your back or lower abdomen. You have difficulty breathing or shortness of breath. You develop swelling or pain in your leg. These symptoms may be an emergency. Get help right away. Call 911. Do not wait to see if the symptoms will go away. Do not drive yourself to the hospital. Summary After the procedure, it is common to have a small amount of blood in your urine. Follow restrictions about lifting and sexual activity as told by your health care provider. Ask what activities are safe for you. Keep all  follow-up visits. This is important. This information is not intended to replace advice given to you by your health care provider. Make sure you discuss any questions you have with your health care provider. Document Revised: 12/31/2020 Document Reviewed: 12/31/2020 Elsevier Patient Education  2024 [employer] General Anesthesia, Adult, Care After The following information offers guidance on how to care for yourself after your procedure. Your health care provider may also give you more specific instructions. If you have problems or questions, contact your health care provider. What can I expect after the procedure? After the procedure, it is common for people to: Have pain or discomfort at the IV site. Have nausea or vomiting. Have a sore throat or hoarseness. Have trouble concentrating. Feel cold or chills. Feel weak, sleepy, or tired (fatigue). Have soreness and body aches. These can affect parts of the body that were not involved in surgery. Follow these instructions at home: For the time period you were told by your health care  provider:  Rest. Do not participate in activities where you could fall or become injured. Do not drive or use machinery. Do not drink alcohol. Do not take sleeping pills or medicines that cause drowsiness. Do not make important decisions or sign legal documents. Do not take care of children on your own. General instructions Drink enough fluid to keep your urine pale yellow. If you have sleep apnea, surgery and certain medicines can increase your risk for breathing problems. Follow instructions from your health care provider about wearing your sleep device: Anytime you are sleeping, including during daytime naps. While taking prescription pain medicines, sleeping medicines, or medicines that make you drowsy. Return to your normal activities as told by your health care provider. Ask your health care provider what activities are safe for you. Take over-the-counter and prescription medicines only as told by your health care provider. Do not use any products that contain nicotine or tobacco. These products include cigarettes, chewing tobacco, and vaping devices, such as e-cigarettes. These can delay incision healing after surgery. If you need help quitting, ask your health care provider. Contact a health care provider if: You have nausea or vomiting that does not get better with medicine. You vomit every time you eat or drink. You have pain that does not get better with medicine. You cannot urinate or have bloody urine. You develop a skin rash. You have a fever. Get help right away if: You have trouble breathing. You have chest pain. You vomit blood. These symptoms may be an emergency. Get help right away. Call 911. Do not wait to see if the symptoms will go away. Do not drive yourself to the hospital. Summary After the procedure, it is common to have a sore throat, hoarseness, nausea, vomiting, or to feel weak, sleepy, or fatigue. For the time period you were told by your health care  provider, do not drive or use machinery. Get help right away if you have difficulty breathing, have chest pain, or vomit blood. These symptoms may be an emergency. This information is not intended to replace advice given to you by your health care provider. Make sure you discuss any questions you have with your health care provider. Document Revised: 07/04/2021 Document Reviewed: 07/04/2021 Elsevier Patient Education  2024 [employer]. How to Use Chlorhexidine Before Surgery Chlorhexidine gluconate (CHG) is a germ-killing (antiseptic) solution that is used to clean the skin. It can get rid of the bacteria that normally live on the skin and can keep them away for  about 24 hours. To clean your skin with CHG, you may be given: A CHG solution to use in the shower or as part of a sponge bath. A prepackaged cloth that contains CHG. Cleaning your skin with CHG may help lower the risk for infection: While you are staying in the intensive care unit of the hospital. If you have a vascular access, such as a central line, to provide short-term or long-term access to your veins. If you have a catheter to drain urine from your bladder. If you are on a ventilator. A ventilator is a machine that helps you breathe by moving air in and out of your lungs. After surgery. What are the risks? Risks of using CHG include: A skin reaction. Hearing loss, if CHG gets in your ears and you have a perforated eardrum. Eye injury, if CHG gets in your eyes and is not rinsed out. The CHG product catching fire. Make sure that you avoid smoking and flames after applying CHG to your skin. Do not use CHG: If you have a chlorhexidine allergy or have previously reacted to chlorhexidine. On babies younger than 14 months of age. How to use CHG solution Use CHG only as told by your health care provider, and follow the instructions on the label. Use the full amount of CHG as directed. Usually, this is one bottle. During a  shower Follow these steps when using CHG solution during a shower (unless your health care provider gives you different instructions): Start the shower. Use your normal soap and shampoo to wash your face and hair. Turn off the shower or move out of the shower stream. Pour the CHG onto a clean washcloth. Do not use any type of brush or rough-edged sponge. Starting at your neck, lather your body down to your toes. Make sure you follow these instructions: If you will be having surgery, pay special attention to the part of your body where you will be having surgery. Scrub this area for at least 1 minute. Do not use CHG on your head or face. If the solution gets into your ears or eyes, rinse them well with water. Avoid your genital area. Avoid any areas of skin that have broken skin, cuts, or scrapes. Scrub your back and under your arms. Make sure to wash skin folds. Let the lather sit on your skin for 1-2 minutes or as long as told by your health care provider. Thoroughly rinse your entire body in the shower. Make sure that all body creases and crevices are rinsed well. Dry off with a clean towel. Do not put any substances on your body afterward--such as powder, lotion, or perfume--unless you are told to do so by your health care provider. Only use lotions that are recommended by the manufacturer. Put on clean clothes or pajamas. If it is the night before your surgery, sleep in clean sheets.  During a sponge bath Follow these steps when using CHG solution during a sponge bath (unless your health care provider gives you different instructions): Use your normal soap and shampoo to wash your face and hair. Pour the CHG onto a clean washcloth. Starting at your neck, lather your body down to your toes. Make sure you follow these instructions: If you will be having surgery, pay special attention to the part of your body where you will be having surgery. Scrub this area for at least 1 minute. Do not use  CHG on your head or face. If the solution gets into your ears  or eyes, rinse them well with water. Avoid your genital area. Avoid any areas of skin that have broken skin, cuts, or scrapes. Scrub your back and under your arms. Make sure to wash skin folds. Let the lather sit on your skin for 1-2 minutes or as long as told by your health care provider. Using a different clean, wet washcloth, thoroughly rinse your entire body. Make sure that all body creases and crevices are rinsed well. Dry off with a clean towel. Do not put any substances on your body afterward--such as powder, lotion, or perfume--unless you are told to do so by your health care provider. Only use lotions that are recommended by the manufacturer. Put on clean clothes or pajamas. If it is the night before your surgery, sleep in clean sheets. How to use CHG prepackaged cloths Only use CHG cloths as told by your health care provider, and follow the instructions on the label. Use the CHG cloth on clean, dry skin. Do not use the CHG cloth on your head or face unless your health care provider tells you to. When washing with the CHG cloth: Avoid your genital area. Avoid any areas of skin that have broken skin, cuts, or scrapes. Before surgery Follow these steps when using a CHG cloth to clean before surgery (unless your health care provider gives you different instructions): Using the CHG cloth, vigorously scrub the part of your body where you will be having surgery. Scrub using a back-and-forth motion for 3 minutes. The area on your body should be completely wet with CHG when you are done scrubbing. Do not rinse. Discard the cloth and let the area air-dry. Do not put any substances on the area afterward, such as powder, lotion, or perfume. Put on clean clothes or pajamas. If it is the night before your surgery, sleep in clean sheets.  For general bathing Follow these steps when using CHG cloths for general bathing (unless your  health care provider gives you different instructions). Use a separate CHG cloth for each area of your body. Make sure you wash between any folds of skin and between your fingers and toes. Wash your body in the following order, switching to a new cloth after each step: The front of your neck, shoulders, and chest. Both of your arms, under your arms, and your hands. Your stomach and groin area, avoiding the genitals. Your right leg and foot. Your left leg and foot. The back of your neck, your back, and your buttocks. Do not rinse. Discard the cloth and let the area air-dry. Do not put any substances on your body afterward--such as powder, lotion, or perfume--unless you are told to do so by your health care provider. Only use lotions that are recommended by the manufacturer. Put on clean clothes or pajamas. Contact a health care provider if: Your skin gets irritated after scrubbing. You have questions about using your solution or cloth. You swallow any chlorhexidine. Call your local poison control center (315-764-7582 in the [place] Get help right away if: Your eyes itch badly, or they become very red or swollen. Your skin itches badly and is red or swollen. Your hearing changes. You have trouble seeing. You have swelling or tingling in your mouth or throat. You have trouble breathing. These symptoms may represent a serious problem that is an emergency. Do not wait to see if the symptoms will go away. Get medical help right away. Call your local emergency services (911 in the [place] Do not drive yourself  to the hospital. Summary Chlorhexidine gluconate (CHG) is a germ-killing (antiseptic) solution that is used to clean the skin. Cleaning your skin with CHG may help to lower your risk for infection. You may be given CHG to use for bathing. It may be in a bottle or in a prepackaged cloth to use on your skin. Carefully follow your health care provider's instructions and the instructions on the  product label. Do not use CHG if you have a chlorhexidine allergy. Contact your health care provider if your skin gets irritated after scrubbing. This information is not intended to replace advice given to you by your health care provider. Make sure you discuss any questions you have with your health care provider. Document Revised: 08/04/2021 Document Reviewed: 06/17/2020 Elsevier Patient Education  2023 [employer].

## 2023-04-19 ENCOUNTER — Encounter (HOSPITAL_COMMUNITY): Payer: Self-pay

## 2023-04-19 ENCOUNTER — Telehealth: Payer: Self-pay | Admitting: Urology

## 2023-04-19 ENCOUNTER — Other Ambulatory Visit: Payer: Self-pay

## 2023-04-19 ENCOUNTER — Encounter (HOSPITAL_COMMUNITY)
Admission: RE | Admit: 2023-04-19 | Discharge: 2023-04-19 | Disposition: A | Discharge status: Home / Self Care | Payer: Medicare Other | Source: Ambulatory Visit | Attending: Urology | Admitting: Urology

## 2023-04-19 VITALS — BP 115/65 | HR 62 | Temp 97.8°F | Resp 18 | Ht 68.0 in | Wt 170.6 lb

## 2023-04-19 DIAGNOSIS — Z01812 Encounter for preprocedural laboratory examination: Secondary | ICD-10-CM | POA: Diagnosis not present

## 2023-04-19 DIAGNOSIS — E1049 Type 1 diabetes mellitus with other diabetic neurological complication: Secondary | ICD-10-CM | POA: Diagnosis not present

## 2023-04-19 DIAGNOSIS — D72820 Lymphocytosis (symptomatic): Secondary | ICD-10-CM | POA: Diagnosis not present

## 2023-04-19 DIAGNOSIS — I10 Essential (primary) hypertension: Secondary | ICD-10-CM

## 2023-04-19 LAB — CBC WITH DIFFERENTIAL/PLATELET
Abs Immature Granulocytes: 0 10*3/uL (ref 0.00–0.07)
Basophils Absolute: 0 10*3/uL (ref 0.0–0.1)
Basophils Relative: 0 %
Eosinophils Absolute: 0.3 10*3/uL (ref 0.0–0.5)
Eosinophils Relative: 2 %
HCT: 36.4 % — ABNORMAL LOW (ref 39.0–52.0)
Hemoglobin: 12.4 g/dL — ABNORMAL LOW (ref 13.0–17.0)
Lymphocytes Relative: 60 %
Lymphs Abs: 10 10*3/uL — ABNORMAL HIGH (ref 0.7–4.0)
MCH: 34.5 pg — ABNORMAL HIGH (ref 26.0–34.0)
MCHC: 34.1 g/dL (ref 30.0–36.0)
MCV: 101.4 fL — ABNORMAL HIGH (ref 80.0–100.0)
Monocytes Absolute: 0.7 10*3/uL (ref 0.1–1.0)
Monocytes Relative: 4 %
Neutro Abs: 5.7 10*3/uL (ref 1.7–7.7)
Neutrophils Relative %: 34 %
Platelets: 293 10*3/uL (ref 150–400)
RBC: 3.59 MIL/uL — ABNORMAL LOW (ref 4.22–5.81)
RDW: 12.8 % (ref 11.5–15.5)
WBC: 16.7 10*3/uL — ABNORMAL HIGH (ref 4.0–10.5)
nRBC: 0 % (ref 0.0–0.2)

## 2023-04-19 LAB — BASIC METABOLIC PANEL
Anion gap: 6 (ref 5–15)
BUN: 22 mg/dL (ref 8–23)
CO2: 23 mmol/L (ref 22–32)
Calcium: 8.8 mg/dL — ABNORMAL LOW (ref 8.9–10.3)
Chloride: 101 mmol/L (ref 98–111)
Creatinine, Ser: 0.95 mg/dL (ref 0.61–1.24)
GFR, Estimated: >60 mL/min (ref >60)
Glucose, Bld: 97 mg/dL (ref 70–99)
Potassium: 4.6 mmol/L (ref 3.5–5.1)
Sodium: 130 mmol/L — ABNORMAL LOW (ref 135–145)

## 2023-04-19 NOTE — Telephone Encounter (Signed)
Having procedure on 04/22/23 and wants to know if he is staying overnight so he can make plans. Please call and advise

## 2023-04-20 NOTE — Telephone Encounter (Signed)
 Patient is aware that he will be under observation for 24 hours post surgery, he wanted to know if his wife will be able to stay with him during this time.  I advised him to reach out to [facility] for confirmation.  Patient voiced understanding.

## 2023-04-22 ENCOUNTER — Encounter (HOSPITAL_COMMUNITY)
Admission: RE | Disposition: A | Discharge status: Home / Self Care | Payer: Self-pay | Source: Home / Self Care | Attending: Urology

## 2023-04-22 ENCOUNTER — Ambulatory Visit (HOSPITAL_COMMUNITY): Payer: Medicare Other | Admitting: Certified Registered"

## 2023-04-22 ENCOUNTER — Encounter (HOSPITAL_COMMUNITY): Payer: Self-pay | Admitting: Urology

## 2023-04-22 ENCOUNTER — Observation Stay (HOSPITAL_COMMUNITY)
Admission: RE | Admit: 2023-04-22 | Discharge: 2023-04-23 | Disposition: A | Discharge status: Home / Self Care | Payer: Medicare Other | Attending: Urology | Admitting: Urology

## 2023-04-22 DIAGNOSIS — N400 Enlarged prostate without lower urinary tract symptoms: Principal | ICD-10-CM | POA: Diagnosis present

## 2023-04-22 DIAGNOSIS — N401 Enlarged prostate with lower urinary tract symptoms: Secondary | ICD-10-CM | POA: Diagnosis not present

## 2023-04-22 DIAGNOSIS — Z91040 Latex allergy status: Secondary | ICD-10-CM | POA: Diagnosis not present

## 2023-04-22 DIAGNOSIS — E109 Type 1 diabetes mellitus without complications: Secondary | ICD-10-CM | POA: Insufficient documentation

## 2023-04-22 DIAGNOSIS — N138 Other obstructive and reflux uropathy: Secondary | ICD-10-CM | POA: Insufficient documentation

## 2023-04-22 DIAGNOSIS — Z794 Long term (current) use of insulin: Secondary | ICD-10-CM | POA: Diagnosis not present

## 2023-04-22 DIAGNOSIS — E119 Type 2 diabetes mellitus without complications: Secondary | ICD-10-CM | POA: Diagnosis not present

## 2023-04-22 DIAGNOSIS — R350 Frequency of micturition: Secondary | ICD-10-CM | POA: Insufficient documentation

## 2023-04-22 DIAGNOSIS — I10 Essential (primary) hypertension: Secondary | ICD-10-CM | POA: Diagnosis not present

## 2023-04-22 HISTORY — PX: TRANSURETHRAL RESECTION OF PROSTATE: SHX73

## 2023-04-22 HISTORY — PX: CYSTOSCOPY: SHX5120

## 2023-04-22 LAB — BASIC METABOLIC PANEL
Anion gap: 7 (ref 5–15)
BUN: 22 mg/dL (ref 8–23)
CO2: 23 mmol/L (ref 22–32)
Calcium: 8.3 mg/dL — ABNORMAL LOW (ref 8.9–10.3)
Chloride: 103 mmol/L (ref 98–111)
Creatinine, Ser: 0.62 mg/dL (ref 0.61–1.24)
GFR, Estimated: >60 mL/min (ref >60)
Glucose, Bld: 104 mg/dL — ABNORMAL HIGH (ref 70–99)
Potassium: 4.4 mmol/L (ref 3.5–5.1)
Sodium: 133 mmol/L — ABNORMAL LOW (ref 135–145)

## 2023-04-22 LAB — CBC
HCT: 35.2 % — ABNORMAL LOW (ref 39.0–52.0)
Hemoglobin: 11.8 g/dL — ABNORMAL LOW (ref 13.0–17.0)
MCH: 33.6 pg (ref 26.0–34.0)
MCHC: 33.5 g/dL (ref 30.0–36.0)
MCV: 100.3 fL — ABNORMAL HIGH (ref 80.0–100.0)
Platelets: 261 10*3/uL (ref 150–400)
RBC: 3.51 MIL/uL — ABNORMAL LOW (ref 4.22–5.81)
RDW: 12.8 % (ref 11.5–15.5)
WBC: 13.7 10*3/uL — ABNORMAL HIGH (ref 4.0–10.5)
nRBC: 0 % (ref 0.0–0.2)

## 2023-04-22 LAB — GLUCOSE, CAPILLARY
Glucose-Capillary: 96 mg/dL (ref 70–99)
Glucose-Capillary: 80 mg/dL (ref 70–99)

## 2023-04-22 SURGERY — CYSTOSCOPY
Anesthesia: General | Site: Prostate

## 2023-04-22 MED ORDER — ZOLPIDEM TARTRATE 5 MG PO TABS: 5.0000 mg | ORAL_TABLET | Freq: Every evening | ORAL | Status: DC | PRN

## 2023-04-22 MED ORDER — PHENYLEPHRINE 80 MCG/ML (10ML) SYRINGE FOR IV PUSH (FOR BLOOD PRESSURE SUPPORT)
PREFILLED_SYRINGE | INTRAVENOUS | Status: DC | PRN
  Administered 2023-04-22: 80 ug via INTRAVENOUS

## 2023-04-22 MED ORDER — FENTANYL CITRATE PF 50 MCG/ML IJ SOSY
25.0000 ug | PREFILLED_SYRINGE | INTRAMUSCULAR | Status: DC | PRN
Start: 2023-04-22 — End: 2023-04-22

## 2023-04-22 MED ORDER — INSULIN ASPART 100 UNIT/ML IJ SOLN: 0.0000 [IU] | Freq: Three times a day (TID) | INTRAMUSCULAR | Status: DC

## 2023-04-22 MED ORDER — FENTANYL CITRATE (PF) 100 MCG/2ML IJ SOLN
INTRAMUSCULAR | Status: DC | PRN
  Administered 2023-04-22 (×2): 25 ug via INTRAVENOUS

## 2023-04-22 MED ORDER — AMLODIPINE BESYLATE 5 MG PO TABS
10.0000 mg | ORAL_TABLET | Freq: Every morning | ORAL | Status: DC
  Administered 2023-04-23: 10 mg via ORAL
  Filled 2023-04-22: qty 2

## 2023-04-22 MED ORDER — EPHEDRINE 5 MG/ML INJ
INTRAVENOUS | Status: AC
  Filled 2023-04-22: qty 5

## 2023-04-22 MED ORDER — INSULIN ASPART 100 UNIT/ML IJ SOLN: 0.0000 [IU] | Freq: Every day | INTRAMUSCULAR | Status: DC

## 2023-04-22 MED ORDER — ASTRAGALUS 470 MG PO CAPS: 470.0000 mg | ORAL_CAPSULE | Freq: Every evening | ORAL | Status: DC

## 2023-04-22 MED ORDER — ATORVASTATIN CALCIUM 40 MG PO TABS
80.0000 mg | ORAL_TABLET | Freq: Every evening | ORAL | Status: DC
  Administered 2023-04-22: 80 mg via ORAL
  Filled 2023-04-22: qty 2

## 2023-04-22 MED ORDER — PROPOFOL 10 MG/ML IV BOLUS
INTRAVENOUS | Status: AC
  Filled 2023-04-22: qty 20

## 2023-04-22 MED ORDER — ACETYL L-CARNITINE 500 MG PO CAPS
500.0000 mg | ORAL_CAPSULE | Freq: Every morning | ORAL | Status: DC
  Filled 2023-04-22 (×4): qty 1

## 2023-04-22 MED ORDER — ONDANSETRON HCL 4 MG/2ML IJ SOLN: 4.0000 mg | Freq: Once | INTRAMUSCULAR | Status: DC | PRN

## 2023-04-22 MED ORDER — OXYBUTYNIN CHLORIDE 5 MG PO TABS: 5.0000 mg | ORAL_TABLET | Freq: Three times a day (TID) | ORAL | Status: DC | PRN

## 2023-04-22 MED ORDER — RESVERATROL 100 MG PO CAPS: ORAL_CAPSULE | Freq: Every evening | ORAL | Status: DC

## 2023-04-22 MED ORDER — SODIUM CHLORIDE 0.9 % IR SOLN
Status: DC | PRN
  Administered 2023-04-22 (×3): 3000 mL via INTRAVESICAL
  Administered 2023-04-22 (×3): 3000 mL
  Administered 2023-04-22 (×5): 3000 mL via INTRAVESICAL
  Administered 2023-04-22 (×2): 3000 mL

## 2023-04-22 MED ORDER — DIPHENHYDRAMINE HCL 12.5 MG/5ML PO ELIX: 12.5000 mg | ORAL_SOLUTION | Freq: Four times a day (QID) | ORAL | Status: DC | PRN

## 2023-04-22 MED ORDER — SODIUM CHLORIDE 0.9 % IV SOLN: INTRAVENOUS | Status: DC | PRN

## 2023-04-22 MED ORDER — ORAL CARE MOUTH RINSE
15.0000 mL | Freq: Once | OROMUCOSAL | Status: DC
Start: 2023-04-22 — End: 2023-04-22

## 2023-04-22 MED ORDER — CHLORHEXIDINE GLUCONATE CLOTH 2 % EX PADS
6.0000 | MEDICATED_PAD | Freq: Every day | CUTANEOUS | Status: DC
  Administered 2023-04-22 – 2023-04-23 (×2): 6 via TOPICAL

## 2023-04-22 MED ORDER — FENTANYL CITRATE (PF) 100 MCG/2ML IJ SOLN
INTRAMUSCULAR | Status: AC
  Filled 2023-04-22: qty 2

## 2023-04-22 MED ORDER — EPHEDRINE SULFATE-NACL 50-0.9 MG/10ML-% IV SOSY
PREFILLED_SYRINGE | INTRAVENOUS | Status: DC | PRN
  Administered 2023-04-22 (×5): 5 mg via INTRAVENOUS

## 2023-04-22 MED ORDER — SODIUM CHLORIDE 0.9 % IV SOLN
2.0000 g | Freq: Once | INTRAVENOUS | Status: AC
  Administered 2023-04-22: 2 g via INTRAVENOUS
  Filled 2023-04-22: qty 20

## 2023-04-22 MED ORDER — MIDAZOLAM HCL 2 MG/2ML IJ SOLN
INTRAMUSCULAR | Status: DC | PRN
  Administered 2023-04-22: 1 mg via INTRAVENOUS

## 2023-04-22 MED ORDER — PHENYLEPHRINE 80 MCG/ML (10ML) SYRINGE FOR IV PUSH (FOR BLOOD PRESSURE SUPPORT)
PREFILLED_SYRINGE | INTRAVENOUS | Status: AC
  Filled 2023-04-22: qty 10

## 2023-04-22 MED ORDER — DIPHENHYDRAMINE HCL 50 MG/ML IJ SOLN: 12.5000 mg | Freq: Four times a day (QID) | INTRAMUSCULAR | Status: DC | PRN

## 2023-04-22 MED ORDER — LACTATED RINGERS IV SOLN: INTRAVENOUS | Status: DC

## 2023-04-22 MED ORDER — ACETAMINOPHEN 325 MG PO TABS: 650.0000 mg | ORAL_TABLET | ORAL | Status: DC | PRN

## 2023-04-22 MED ORDER — RISAQUAD PO CAPS
1.0000 | ORAL_CAPSULE | Freq: Every morning | ORAL | Status: DC
  Administered 2023-04-23: 1 via ORAL
  Filled 2023-04-22: qty 1

## 2023-04-22 MED ORDER — PROPOFOL 10 MG/ML IV BOLUS
INTRAVENOUS | Status: DC | PRN
  Administered 2023-04-22: 200 mg via INTRAVENOUS

## 2023-04-22 MED ORDER — OXYCODONE HCL 5 MG PO TABS: 5.0000 mg | ORAL_TABLET | Freq: Once | ORAL | Status: DC | PRN

## 2023-04-22 MED ORDER — SODIUM CHLORIDE 0.9 % IR SOLN
3000.0000 mL | Status: DC
  Administered 2023-04-23: 3000 mL

## 2023-04-22 MED ORDER — GLYCOPYRROLATE PF 0.2 MG/ML IJ SOSY
PREFILLED_SYRINGE | INTRAMUSCULAR | Status: AC
  Filled 2023-04-22: qty 1

## 2023-04-22 MED ORDER — ONDANSETRON HCL 4 MG/2ML IJ SOLN
INTRAMUSCULAR | Status: DC | PRN
  Administered 2023-04-22: 4 mg via INTRAVENOUS

## 2023-04-22 MED ORDER — LIDOCAINE HCL (PF) 2 % IJ SOLN
INTRAMUSCULAR | Status: DC | PRN
  Administered 2023-04-22: 100 mg via INTRADERMAL

## 2023-04-22 MED ORDER — CHLORHEXIDINE GLUCONATE 0.12 % MT SOLN
15.0000 mL | Freq: Once | OROMUCOSAL | Status: DC
Start: 2023-04-22 — End: 2023-04-22

## 2023-04-22 MED ORDER — DEXAMETHASONE SODIUM PHOSPHATE 10 MG/ML IJ SOLN
INTRAMUSCULAR | Status: AC
  Filled 2023-04-22: qty 1

## 2023-04-22 MED ORDER — ONDANSETRON HCL 4 MG/2ML IJ SOLN
INTRAMUSCULAR | Status: AC
  Filled 2023-04-22: qty 2

## 2023-04-22 MED ORDER — GLYCOPYRROLATE PF 0.2 MG/ML IJ SOSY
PREFILLED_SYRINGE | INTRAMUSCULAR | Status: DC | PRN
  Administered 2023-04-22 (×2): .1 mg via INTRAVENOUS

## 2023-04-22 MED ORDER — SENNOSIDES-DOCUSATE SODIUM 8.6-50 MG PO TABS
2.0000 | ORAL_TABLET | Freq: Every day | ORAL | Status: DC
  Administered 2023-04-22: 2 via ORAL
  Filled 2023-04-22 (×2): qty 2

## 2023-04-22 MED ORDER — IRBESARTAN 150 MG PO TABS
75.0000 mg | ORAL_TABLET | Freq: Every day | ORAL | Status: DC
  Administered 2023-04-23: 75 mg via ORAL
  Filled 2023-04-22: qty 1

## 2023-04-22 MED ORDER — SODIUM CHLORIDE 0.9 % IV SOLN: INTRAVENOUS | Status: DC

## 2023-04-22 MED ORDER — HYDROMORPHONE HCL 1 MG/ML IJ SOLN: 0.5000 mg | INTRAMUSCULAR | Status: DC | PRN

## 2023-04-22 MED ORDER — DEXAMETHASONE SODIUM PHOSPHATE 10 MG/ML IJ SOLN
INTRAMUSCULAR | Status: DC | PRN
  Administered 2023-04-22: 5 mg via INTRAVENOUS

## 2023-04-22 MED ORDER — CHLORHEXIDINE GLUCONATE 0.12 % MT SOLN
OROMUCOSAL | Status: AC
  Filled 2023-04-22: qty 15

## 2023-04-22 MED ORDER — STERILE WATER FOR IRRIGATION IR SOLN
Status: DC | PRN
  Administered 2023-04-22: 500 mL

## 2023-04-22 MED ORDER — ALPHA-LIPOIC ACID 300 MG PO CAPS
300.0000 mg | ORAL_CAPSULE | Freq: Two times a day (BID) | ORAL | Status: DC
  Filled 2023-04-22 (×7): qty 1

## 2023-04-22 MED ORDER — OXYCODONE HCL 5 MG/5ML PO SOLN: 5.0000 mg | Freq: Once | ORAL | Status: DC | PRN

## 2023-04-22 MED ORDER — CETIRIZINE HCL 10 MG PO TABS
10.0000 mg | ORAL_TABLET | Freq: Every day | ORAL | Status: DC
  Filled 2023-04-22 (×4): qty 1

## 2023-04-22 MED ORDER — ONDANSETRON HCL 4 MG/2ML IJ SOLN: 4.0000 mg | INTRAMUSCULAR | Status: DC | PRN

## 2023-04-22 MED ORDER — MIDAZOLAM HCL 2 MG/2ML IJ SOLN
INTRAMUSCULAR | Status: AC
  Filled 2023-04-22: qty 2

## 2023-04-22 MED ORDER — LIDOCAINE HCL (PF) 2 % IJ SOLN
INTRAMUSCULAR | Status: AC
  Filled 2023-04-22: qty 5

## 2023-04-22 MED ORDER — OXYCODONE HCL 5 MG PO TABS: 5.0000 mg | ORAL_TABLET | ORAL | Status: DC | PRN

## 2023-04-22 MED ORDER — CARVEDILOL 3.125 MG PO TABS
3.1250 mg | ORAL_TABLET | Freq: Two times a day (BID) | ORAL | Status: DC
  Administered 2023-04-23: 3.125 mg via ORAL
  Filled 2023-04-22: qty 1

## 2023-04-22 SURGICAL SUPPLY — 25 items
BAG DRAIN URO TABLE W/ADPT NS (BAG) ×2 IMPLANT
BAG HAMPER (MISCELLANEOUS) ×2 IMPLANT
BAG URINE DRAIN TURP 4L (OSTOMY) ×2 IMPLANT
CATH FOLEY 3WAY 30CC 22FR (CATHETERS) IMPLANT
CLOTH BEACON ORANGE TIMEOUT ST (SAFETY) ×2 IMPLANT
ELECT LOOP 22F BIPOLAR SML (ELECTROSURGICAL) ×2
ELECT REM PT RETURN 9FT ADLT (ELECTROSURGICAL) ×2
ELECTRODE LOOP 22F BIPOLAR SML (ELECTROSURGICAL) IMPLANT
ELECTRODE REM PT RTRN 9FT ADLT (ELECTROSURGICAL) ×2 IMPLANT
GLOVE BIO SURGEON STRL SZ8 (GLOVE) ×2 IMPLANT
GLOVE BIOGEL PI IND STRL 7.0 (GLOVE) ×4 IMPLANT
GOWN STRL REUS W/TWL LRG LVL3 (GOWN DISPOSABLE) ×4 IMPLANT
GOWN STRL REUS W/TWL XL LVL3 (GOWN DISPOSABLE) ×2 IMPLANT
IV NS IRRIG 3000ML ARTHROMATIC (IV SOLUTION) ×8 IMPLANT
KIT TURNOVER CYSTO (KITS) ×2 IMPLANT
LOOP CUT BIPOLAR 24F LRG (ELECTROSURGICAL) ×2 IMPLANT
MANIFOLD NEPTUNE II (INSTRUMENTS) ×2 IMPLANT
PACK CYSTO (CUSTOM PROCEDURE TRAY) ×2 IMPLANT
PAD ARMBOARD 7.5X6 YLW CONV (MISCELLANEOUS) ×2 IMPLANT
POSITIONER HEAD 8X9X4 ADT (SOFTGOODS) ×2 IMPLANT
SYR 30ML LL (SYRINGE) ×2 IMPLANT
SYR TOOMEY IRRIG 70ML (MISCELLANEOUS) ×2
SYRINGE TOOMEY IRRIG 70ML (MISCELLANEOUS) ×2 IMPLANT
TOWEL OR 17X26 4PK STRL BLUE (TOWEL DISPOSABLE) ×2 IMPLANT
WATER STERILE IRR 500ML POUR (IV SOLUTION) ×2 IMPLANT

## 2023-04-22 NOTE — Op Note (Signed)
 Preoperative diagnosis: BPH  Postoperative diagnosis: BPH  Procedure: 1 cystoscopy 2. Transurethral resection of the prostate  Attending: [name]  Anesthesia: General  Estimated blood loss: Minimal  Drains: 22 French foley  Specimens: 1. Prostate Chips  Antibiotics: Rocephin   Findings: bilobar prostate enlargement. Ureteral orifices in normal anatomic location.   Indications: Patient is a 73 year old male with a history of BPH and urinary frequency.  After discussing treatment options, they decided proceed with transurethral resection of the prostate.  Procedure in detail: The patient was brought to the operating room and a brief timeout was done to ensure correct patient, correct procedure, correct site.  General anesthesia was administered patient was placed in dorsal lithotomy position.  Their genitalia was then prepped and draped in usual sterile fashion.  A rigid 22 French cystoscope was passed in the urethra and the bladder.  Bladder was inspected and we noted no masses or lesions.  the ureteral orifices were in the normal orthotopic locations. removed the cystoscope and placed a resectoscope into the bladder. We then turned our attention to the prostate resection. Using the bipolar resectoscope we resected the lateral lobes from the bladder neck to the verumontanum. We then started at the 12 oclock position on the left lobe and resection to the 6 o'clock position from the bladder neck to the verumontanum. We then did the same resection of the right lobe. Once the resection was complete we then cauterized individual bleeders. We then removed the prostate chips and sent them for pathology.  We then re-inspected the prostatic fossa and found no residual bleeding.  the bladder was then drained, a 22 French foley was placed and this concluded the procedure which was well tolerated by patient.  Complications: None  Condition: Stable, extubated, transferred to PACU  Plan:  Patient is admitted overnight with continuous bladder irrigation. If their urine is clear tomorrow they will be discharged home and followup in 5 days for foley catheter removal and pathology discussion.

## 2023-04-22 NOTE — H&P (Signed)
 HPI: Christopher Conner is a 73yo here for TURP for BPH with urinary urgency, nocturia, and urinary frequency. He was started on rapaflo  8mg  daily last visit which improved his urinary frequency for 1.25 hours to 1.5-2 hours. He notes the urination is gets worse after 14-16 hours.  The urinary urgency is unchanged. IPSS 18 QOL 6 on rapalfo 8mg .      PMH:     Past Medical History:  Diagnosis Date   Arthritis     Bilateral foot-drop      after back surgery   BPH (benign prostatic hyperplasia)     Cataract     Chronic lymphocytic leukemia (HCC) early 2018    no current treatment   CLL (chronic lymphocytic leukemia) (HCC)     DM type 1 (diabetes mellitus, type 1) (HCC)      type 1   High cholesterol     Hypertension     Neuromuscular disorder (HCC)      diabetic neuropathy   Normal pressure hydrocephalus (HCC) 2006    brain shut inserted          Surgical History:      Past Surgical History:  Procedure Laterality Date   APPENDECTOMY   early 20's   BACK SURGERY       BRAIN SURGERY   10/30/04    brain shunt, Codman Hakim   CATARACT EXTRACTION Bilateral      left eye prostheisi shell   CYST REMOVAL NECK   yrs ago   CYSTOSCOPY WITH INJECTION [name] 12/17/2022    Procedure: CYSTOSCOPY WITH INJECTION-Botox ;  Surgeon: Sherrilee Belvie CROME, MD;  Location: [facility];  Service: Urology;  Laterality: [name];   CYSTOSCOPY WITH INSERTION OF UROLIFT   april/march 2020    done in office   CYSTOSCOPY WITH INSERTION OF UROLIFT [name] 10/12/2019    Procedure: CYSTOSCOPY WITH INSERTION OF UROLIFT;  Surgeon: Sherrilee Belvie CROME, MD;  Location: [facility];  Service: Urology;  Laterality: [name];   EYE SURGERY       LUMBAR FUSION   1998 and 2000    l5 to s1 1998, then l4 and l5 in 2000   SPINE SURGERY        H. Elsner, L4-5 & L5-S1   VENTRICULOPERITONEAL SHUNT   2006          Home Medications:  Allergies as of 02/26/2023         Reactions    Lisinopril  Cough    Latex Itching, Rash             Medication List           Accurate as of February 26, 2023 10:57 AM. If you have any questions, ask your nurse or doctor.              ACETYL L-CARNITINE PO Take 500 mg by mouth daily.    acidophilus Caps capsule Take 1 capsule by mouth daily.    Alpha-Lipoic Acid 300 MG Caps Take 300 mg by mouth 2 (two) times daily.    amLODipine  10 MG tablet Commonly known as: NORVASC  Take 10 mg by mouth daily.    ASTRAGALUS PO Take 470 mg by mouth daily.    atorvastatin  80 MG tablet Commonly known as: LIPITOR  Take 80 mg by mouth every evening.    B-100 COMPLEX CR PO Take 1 tablet by mouth daily.    carvedilol  3.125 MG tablet Commonly known as: COREG  Take 3.125 mg by mouth 2 (two)  times daily with a meal.    Contour Next Test test strip Generic drug: glucose blood 2 (two) times daily.    CoQ-10 100 MG capsule Take 100 mg by mouth at bedtime.    fludrocortisone 0.1 MG tablet Commonly known as: FLORINEF Take 0.1 mg by mouth every other day.    folic acid 1 MG tablet Commonly known as: FOLVITE Take 1 mg by mouth daily.    GRAPE SEED EXTRACT PO Take 200 mg by mouth daily.    Green Tea 150 MG Caps Take 150 mg by mouth daily.    levocetirizine 5 MG tablet Commonly known as: XYZAL  Take 1 tablet (5 mg total) by mouth every evening. What changed: when to take this    meclizine  25 MG tablet Commonly known as: ANTIVERT  Take 1 tablet (25 mg total) by mouth 3 (three) times daily as needed for dizziness.    methotrexate 2.5 MG tablet Commonly known as: RHEUMATREX Take 20 mg by mouth every Thursday.    Methyl-Folate 1700 MCG Caps Generic drug: Levomefolate Glucosamine Take 1,000 mcg by mouth daily.    NovoLOG  100 UNIT/ML injection Generic drug: insulin  aspart INJECT UP TO 100 UNITS DAILY MAXIMUM DOSE WITH INSULIN  PUMP DAILY    OMEGA 3-6-9 FATTY ACIDS PO Take 1 capsule by mouth 2 (two) times daily.    ondansetron  8 MG disintegrating tablet Commonly known as:  Zofran  ODT 8mg  ODT q6 hours prn nausea    REFRESH OP Apply 1-2 drops to eye every 3 (three) hours as needed (dryness).    RESVERATROL PO Take 50 mg by mouth daily.    silodosin  8 MG Caps capsule Commonly known as: RAPAFLO  Take 1 capsule (8 mg total) by mouth at bedtime.    telmisartan  40 MG tablet Commonly known as: MICARDIS  Take 40 mg by mouth daily.    vitamin C 1000 MG tablet Take 1,000 mg by mouth daily.    Vitamin D 50 MCG (2000 UT) Caps Take 2,000 Units by mouth daily.             Allergies:  Allergies      Allergies  Allergen Reactions   Lisinopril  Cough   Latex Itching and Rash        Family History:      Family History  Problem Relation Age of Onset   Hypertension Mother     Hyperlipidemia Mother     Diabetes Mother     Diabetes Father            Social History:  reports that he has never smoked. He has never used smokeless tobacco. He reports that he does not drink alcohol and does not use drugs.   ROS: All other review of systems were reviewed and are negative except what is noted above in HPI   Physical Exam: BP (!) 102/55   Pulse (!) 59   Constitutional:  Alert and oriented, No acute distress. HEENT: [place] AT, moist mucus membranes.  Trachea midline, no masses. Cardiovascular: No clubbing, cyanosis, or edema. Respiratory: Normal respiratory effort, no increased work of breathing. GI: Abdomen is soft, nontender, nondistended, no abdominal masses GU: No CVA tenderness.  Lymph: No cervical or inguinal lymphadenopathy. Skin: No rashes, bruises or suspicious lesions. Neurologic: Grossly intact, no focal deficits, moving all 4 extremities. Psychiatric: Normal mood and affect.   Laboratory Data: Recent Labs       Lab Results  Component Value Date    WBC 14.8 (H) 12/11/2022  HGB 11.8 (L) 12/11/2022    HCT 35.9 (L) 12/11/2022    MCV 102.3 (H) 12/11/2022    PLT 260 12/11/2022        Recent Labs       Lab Results  Component Value  Date    CREATININE 0.75 12/11/2022        Recent Labs  No results found for: PSA     Recent Labs  No results found for: TESTOSTERONE     Recent Labs       Lab Results  Component Value Date    HGBA1C 6.0 (H) 12/11/2022        Urinalysis Labs (Brief)          Component Value Date/Time    COLORURINE YELLOW 08/16/2015 0244    APPEARANCEUR Clear 01/29/2023 1024    LABSPEC 1.029 08/16/2015 0244    PHURINE 5.5 08/16/2015 0244    GLUCOSEU Negative 01/29/2023 1024    HGBUR NEGATIVE 08/16/2015 0244    BILIRUBINUR Negative 01/29/2023 1024    KETONESUR >80 (A) 08/16/2015 0244    PROTEINUR Negative 01/29/2023 1024    PROTEINUR NEGATIVE 08/16/2015 0244    UROBILINOGEN 0.2 04/09/2014 1947    NITRITE Negative 01/29/2023 1024    NITRITE NEGATIVE 08/16/2015 0244    LEUKOCYTESUR Negative 01/29/2023 1024        Recent Labs       Lab Results  Component Value Date    LABMICR Comment 01/29/2023    BACTERIA RARE (A) 08/16/2015        Pertinent Imaging:   Results for orders placed during the hospital encounter of 05/23/10   DG Abd 1 View   Narrative *RADIOLOGY REPORT*   Clinical Data: Weakness with nausea and vomiting.   ABDOMEN - 1 VIEW   Comparison: 02/10/2010.   Findings: Ventriculoperitoneal shunt tubing extends into the right mid abdomen.  There is no shunt discontinuity.  Moderate stool is noted throughout the colon.  The bowel gas pattern is nonobstructive.  There are postsurgical changes of the lower lumbar spine status post fusion.   IMPRESSION:   1.  Moderate stool throughout the colon suggesting constipation. 2.  Intact ventricular peritoneal shunt.   Original Report Authenticated By: ELSIE WENDI PERONE, M.D.   No results found for this or any previous visit.   No results found for this or any previous visit.   No results found for this or any previous visit.   No results found for this or any previous visit.   No valid procedures  specified. No results found for this or any previous visit.   No results found for this or any previous visit.     Assessment & Plan:     1. Benign localized prostatic hyperplasia with lower urinary tract symptoms (LUTS) We discussed the management of his BPH including continued medical therapy, Rezum, Urolift, TURP and simple prostatectomy. After discussing the options the patient has elected to proceed with TURP. Risks/benefits/alternatives discussed.

## 2023-04-22 NOTE — Transfer of Care (Signed)
 Immediate Anesthesia Transfer of Care Note  Patient: Christopher Conner  Procedure(s) Performed: CYSTOSCOPY (Bladder) TRANSURETHRAL RESECTION OF THE PROSTATE (TURP) (Prostate)  Patient Location: PACU  Anesthesia Type:General  Level of Consciousness: awake  Airway & Oxygen Therapy: Patient Spontanous Breathing and Patient connected to face mask oxygen  Post-op Assessment: Report given to RN and Post -op Vital signs reviewed and stable  Post vital signs: Reviewed and stable  Last Vitals:  Vitals Value Taken Time  BP 140/56 04/22/23 0904  Temp    Pulse 70 04/22/23 0906  Resp 21 04/22/23 0906  SpO2 100 % 04/22/23 0906  Vitals shown include unfiled device data.  Last Pain:  Vitals:   04/22/23 0703  TempSrc: Oral  PainSc: 0-No pain         Complications: No notable events documented.

## 2023-04-22 NOTE — Anesthesia Preprocedure Evaluation (Signed)
Anesthesia Evaluation  Patient identified by MRN, date of birth, ID band Patient awake    Reviewed: Allergy & Precautions, H&P , NPO status , Patient's Chart, lab work & pertinent test results, reviewed documented beta blocker date and time   Airway Mallampati: II  TM Distance: >3 FB Neck ROM: full    Dental no notable dental hx.    Pulmonary neg pulmonary ROS   Pulmonary exam normal breath sounds clear to auscultation       Cardiovascular Exercise Tolerance: Good hypertension, negative cardio ROS  Rhythm:regular Rate:Normal     Neuro/Psych  Neuromuscular disease negative neurological ROS  negative psych ROS   GI/Hepatic negative GI ROS, Neg liver ROS,,,  Endo/Other  negative endocrine ROSdiabetes    Renal/GU negative Renal ROS  negative genitourinary   Musculoskeletal   Abdominal   Peds  Hematology negative hematology ROS (+)   Anesthesia Other Findings   Reproductive/Obstetrics negative OB ROS                             Anesthesia Physical Anesthesia Plan  ASA: 3  Anesthesia Plan: General and General LMA   Post-op Pain Management:    Induction:   PONV Risk Score and Plan: Ondansetron  Airway Management Planned:   Additional Equipment:   Intra-op Plan:   Post-operative Plan:   Informed Consent: I have reviewed the patients History and Physical, chart, labs and discussed the procedure including the risks, benefits and alternatives for the proposed anesthesia with the patient or authorized representative who has indicated his/her understanding and acceptance.     Dental Advisory Given  Plan Discussed with: CRNA  Anesthesia Plan Comments:        Anesthesia Quick Evaluation

## 2023-04-22 NOTE — Anesthesia Procedure Notes (Addendum)
 Procedure Name: LMA Insertion Date/Time: 04/22/2023 7:47 AM  Performed by: Eliodoro Deward FALCON, CRNAPre-anesthesia Checklist: Patient identified, Emergency Drugs available, Suction available and Patient being monitored Patient Re-evaluated:Patient Re-evaluated prior to induction Oxygen Delivery Method: Circle system utilized Preoxygenation: Pre-oxygenation with 100% oxygen Induction Type: IV induction LMA: LMA with gastric port inserted LMA Size: 5.0 Tube type: Oral Number of attempts: 1 Placement Confirmation: positive ETCO2 Tube secured with: Tape Dental Injury: Teeth and Oropharynx as per pre-operative assessment

## 2023-04-23 ENCOUNTER — Encounter (HOSPITAL_COMMUNITY): Payer: Self-pay | Admitting: Urology

## 2023-04-23 DIAGNOSIS — N401 Enlarged prostate with lower urinary tract symptoms: Secondary | ICD-10-CM | POA: Diagnosis not present

## 2023-04-23 DIAGNOSIS — I10 Essential (primary) hypertension: Secondary | ICD-10-CM | POA: Diagnosis not present

## 2023-04-23 DIAGNOSIS — R350 Frequency of micturition: Secondary | ICD-10-CM | POA: Diagnosis not present

## 2023-04-23 DIAGNOSIS — N138 Other obstructive and reflux uropathy: Secondary | ICD-10-CM | POA: Diagnosis not present

## 2023-04-23 DIAGNOSIS — Z91040 Latex allergy status: Secondary | ICD-10-CM | POA: Diagnosis not present

## 2023-04-23 DIAGNOSIS — E109 Type 1 diabetes mellitus without complications: Secondary | ICD-10-CM | POA: Diagnosis not present

## 2023-04-23 LAB — BASIC METABOLIC PANEL
Anion gap: 4 — ABNORMAL LOW (ref 5–15)
BUN: 19 mg/dL (ref 8–23)
CO2: 25 mmol/L (ref 22–32)
Calcium: 8.4 mg/dL — ABNORMAL LOW (ref 8.9–10.3)
Chloride: 102 mmol/L (ref 98–111)
Creatinine, Ser: 0.66 mg/dL (ref 0.61–1.24)
GFR, Estimated: >60 mL/min (ref >60)
Glucose, Bld: 151 mg/dL — ABNORMAL HIGH (ref 70–99)
Potassium: 4.2 mmol/L (ref 3.5–5.1)
Sodium: 131 mmol/L — ABNORMAL LOW (ref 135–145)

## 2023-04-23 LAB — GLUCOSE, CAPILLARY
Glucose-Capillary: 102 mg/dL — ABNORMAL HIGH (ref 70–99)
Glucose-Capillary: 116 mg/dL — ABNORMAL HIGH (ref 70–99)

## 2023-04-23 LAB — CBC
HCT: 30.2 % — ABNORMAL LOW (ref 39.0–52.0)
Hemoglobin: 10.4 g/dL — ABNORMAL LOW (ref 13.0–17.0)
MCH: 34.8 pg — ABNORMAL HIGH (ref 26.0–34.0)
MCHC: 34.4 g/dL (ref 30.0–36.0)
MCV: 101 fL — ABNORMAL HIGH (ref 80.0–100.0)
Platelets: 253 10*3/uL (ref 150–400)
RBC: 2.99 MIL/uL — ABNORMAL LOW (ref 4.22–5.81)
RDW: 12.7 % (ref 11.5–15.5)
WBC: 17.3 10*3/uL — ABNORMAL HIGH (ref 4.0–10.5)
nRBC: 0 % (ref 0.0–0.2)

## 2023-04-23 LAB — SURGICAL PATHOLOGY

## 2023-04-23 MED ORDER — TRAMADOL HCL 50 MG PO TABS: 50.0000 mg | ORAL_TABLET | Freq: Four times a day (QID) | ORAL | 0 refills | Status: DC | PRN

## 2023-04-23 MED ORDER — LORATADINE 10 MG PO TABS
10.0000 mg | ORAL_TABLET | Freq: Every day | ORAL | Status: DC
  Administered 2023-04-23: 10 mg via ORAL
  Filled 2023-04-23: qty 1

## 2023-04-23 NOTE — Care Management Obs Status (Signed)
 MEDICARE OBSERVATION STATUS NOTIFICATION   Patient Details  Name: Christopher Conner MRN: [id number] Date of Birth: 03/14/1951   Medicare Observation Status Notification Given:  Yes    Corey Harold 04/23/2023, 8:49 AM

## 2023-04-23 NOTE — Progress Notes (Signed)
   04/23/23 1429  TOC Brief Assessment  Insurance and Status Reviewed  Patient has primary care physician Yes  Home environment has been reviewed Single Family Home  Prior level of function: Independent  Prior/Current Home Services No current home services  Social Drivers of Health Review SDOH reviewed no interventions necessary  Readmission risk has been reviewed Yes  Transition of care needs no transition of care needs at this time    Transition of Care Department [facility]) has reviewed patient and no TOC needs have been identified at this time. We will continue to monitor patient advancement through interdisciplinary progression rounds. If new patient transition needs arise, please place a TOC consult.

## 2023-04-23 NOTE — Progress Notes (Signed)
 Discharge instructions and foley catheter maintained reviewed with patient and patient's wife. Both verbalized understanding and demonstrated understanding of instructions. Patient discharged home with wife in stable condition.

## 2023-04-23 NOTE — Plan of Care (Signed)

## 2023-04-26 ENCOUNTER — Telehealth: Payer: Self-pay

## 2023-04-26 NOTE — Telephone Encounter (Signed)
 Patient called with concerns of blood clots after his TURP procedure, he states the catheter is draining more of a pink color today and he has not noticed many clots today.  He states that the clots happen more at night.  After further discussion it sounds that the catheter achor was placed too low and is causing tension.  Patient does not want to travel due to the road conditions today.  He will wait until 01/08 as scheduled and I informed him to go to the nearest ER if he notices his catheter is not draining.  Patient voiced understanding.

## 2023-04-28 ENCOUNTER — Encounter: Payer: Self-pay | Admitting: Urology

## 2023-04-28 ENCOUNTER — Ambulatory Visit: Payer: Medicare Other | Admitting: Urology

## 2023-04-28 VITALS — BP 165/75 | HR 67

## 2023-04-28 DIAGNOSIS — N401 Enlarged prostate with lower urinary tract symptoms: Secondary | ICD-10-CM

## 2023-04-28 MED ORDER — CIPROFLOXACIN HCL 500 MG PO TABS
500.0000 mg | ORAL_TABLET | Freq: Once | ORAL | Status: AC
  Administered 2023-04-28: 500 mg via ORAL

## 2023-04-28 NOTE — Progress Notes (Signed)
 Fill and Pull Catheter Removal  Patient is present today for a catheter removal.  Patient was cleaned and prepped in a sterile fashion of sterile water / saline was instilled into the bladder when the patient felt the urge to urinate. 30ml of water  was then drained from the balloon.  A 22FR foley cath was removed from the bladder no complications were noted .  Patient as then given some time to void on their own.  Patient can void  on their own after some time.  Patient tolerated well.  Performed by: Veleria, CMA  Follow up/ Additional notes: MD to see after.

## 2023-04-28 NOTE — Patient Instructions (Signed)
Transurethral Resection of the Prostate Transurethral resection of the prostate (TURP) is the removal, or resection, of part of the prostate tissue. This procedure is done to treat an enlarged prostate gland (benign prostatic hyperplasia). The goal of TURP is to remove enough prostate tissue to allow for a normal flow of urine. The procedure will allow you to empty your bladder more completely when you urinate so that you can urinate less often. In a transurethral resection, a thin telescope with a light, a camera, and an electric cutting edge (resectoscope) is passed through the urethra and into the prostate. The opening of the urethra is at the end of the penis. Tell a health care provider about: Any allergies you have. All medicines you are taking, including vitamins, herbs, eye drops, creams, and over-the-counter medicines. Any problems you or family members have had with anesthetic medicines. Any bleeding problems you have. Any surgeries you have had. Any medical conditions you have. Any prostate infections you have had. What are the risks? Generally, this is a safe procedure. However, problems may occur, including: Infection. Bleeding. Allergic reactions to medicines. Blood in the urine (hematuria). Damage to nearby structures or organs. Other problems may occur, but they are rare. They include: Dry ejaculation, or having no semen come out during orgasm. Erectile dysfunction, or being unable to have or keep an erection. Scarring that leads to narrowing of the urethra. This narrowing may block the flow of urine. Inability to control when you urinate (incontinence). Deep vein thrombosis. This is a blood clot that can develop in your leg. TURP syndrome. This can happen when you lose too much sodium during or after the procedure. Some signs and symptoms of this condition include: Weakness. Headaches. Nausea or vomiting. Muscle cramping. What happens before the procedure? When to stop  eating and drinking Follow instructions from your health care provider about what you may eat and drink before your procedure. These may include: 8 hours before your procedure Stop eating most foods. Do not eat meat, fried foods, or fatty foods. Eat only light foods, such as toast or crackers. All liquids are okay except energy drinks and alcohol. 6 hours before your procedure Stop eating. Drink only clear liquids, such as water, clear fruit juice, black coffee, plain tea, and sports drinks. Do not drink energy drinks or alcohol. 2 hours before your procedure Stop drinking all liquids. You may be allowed to take medicines with small sips of water. If you do not follow your health care provider's instructions, your procedure may be delayed or canceled. Medicines Ask your health care provider about: Changing or stopping your regular medicines. This is especially important if you are taking diabetes medicines or blood thinners. Taking medicines such as aspirin and ibuprofen. These medicines can thin your blood. Do not take these medicines unless your health care provider tells you to take them. Taking over-the-counter medicines, vitamins, herbs, and supplements. Surgery safety Ask your health care provider what steps will be taken to help prevent infection. These steps may include: Removing hair at the surgery site. Washing skin with a germ-killing soap. Taking antibiotic medicine. General instructions Do not use any products that contain nicotine or tobacco for at least 4 weeks before the procedure. These products include cigarettes, chewing tobacco, and vaping devices, such as e-cigarettes. If you need help quitting, ask your health care provider. If you will be going home right after the procedure, plan to have a responsible adult: Take you home from the hospital or clinic. You  will not be allowed to drive. Care for you for the time you are told. What happens during the procedure?  An  IV will be inserted into one of your veins. You will be given one or more of the following: A medicine to help you relax (sedative). A medicine to make you fall asleep (general anesthetic). A medicine that is injected into your spine to numb the area below and slightly above the injection site (spinal anesthetic). Your legs will be placed in foot rests (stirrups) so that your legs are apart and your knees are bent. The resectoscope will be passed through your urethra to your prostate. Parts of your prostate will be resected using the cutting edge of the resectoscope. Fluid will be passed to rinse out the cut tissues (irrigation). The resectoscope will be removed. A small, thin tube (catheter) will be passed through your urethra and into your bladder. The catheter will drain urine into a bag outside of your body. The procedure may vary among health care providers and hospitals. What happens after the procedure? Your blood pressure, heart rate, breathing rate, and blood oxygen level will be monitored until you leave the hospital or clinic. You will be given fluids through the IV. The IV will be removed when you start eating and drinking normally. You may have some pain. Pain medicine will be available to help you. You will have a catheter draining your urine. You may have blood in your urine. Your catheter may be kept in until your urine is clear. Your urinary drainage will be monitored. If necessary, your bladder may be rinsed out (irrigated) through your catheter. You will be encouraged to walk around as soon as possible. You may have to wear compression stockings. These stockings help to prevent blood clots and reduce swelling in your legs. If you were given a sedative during the procedure, it can affect you for several hours. Do not drive or operate machinery until your health care provider says that it is safe. Summary Transurethral resection of the prostate (TURP) is the removal  (resection) of part of the prostate tissue. The goal of this procedure is to remove enough prostate tissue to allow for a normal flow of urine. Follow instructions from your health care provider about taking medicines and about eating and drinking before the procedure. This information is not intended to replace advice given to you by your health care provider. Make sure you discuss any questions you have with your health care provider. Document Revised: 12/31/2020 Document Reviewed: 12/31/2020 [employer] Patient Education  2024 [employer].

## 2023-04-28 NOTE — Progress Notes (Signed)
 04/28/2023 9:08 AM   Christopher Conner 03-19-1951 [id number]  Referring provider: Oris Camie BRAVO, NP [address] [place],  [place] [address]  Folllowup after TURP   HPI: [name] is a 73yo here for followup after TURP. Pathology Benign. Voiding trial passed today.    PMH: Past Medical History:  Diagnosis Date   Arthritis    Bilateral foot-drop    after back surgery   BPH (benign prostatic hyperplasia)    Cataract    Chronic lymphocytic leukemia (HCC) early 2018   no current treatment   CLL (chronic lymphocytic leukemia) (HCC)    DM type 1 (diabetes mellitus, type 1) (HCC)    type 1   High cholesterol    Hypertension    Neuromuscular disorder (HCC)    diabetic neuropathy   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    Surgical History: Past Surgical History:  Procedure Laterality Date   APPENDECTOMY  early 20's   BACK SURGERY     BRAIN SURGERY  10/30/04   brain shunt, Codman Hakim   CATARACT EXTRACTION Bilateral    left eye prostheisi shell   CYST REMOVAL NECK  yrs ago   CYSTOSCOPY [name] 04/22/2023   Procedure: CYSTOSCOPY;  Surgeon: Sherrilee Belvie CROME, MD;  Location: [facility];  Service: Urology;  Laterality: [name];   CYSTOSCOPY WITH INJECTION [name] 12/17/2022   Procedure: CYSTOSCOPY WITH INJECTION-Botox ;  Surgeon: Sherrilee Belvie CROME, MD;  Location: [facility];  Service: Urology;  Laterality: [name];   CYSTOSCOPY WITH INSERTION OF UROLIFT  april/march 2020   done in office   CYSTOSCOPY WITH INSERTION OF UROLIFT [name] 10/12/2019   Procedure: CYSTOSCOPY WITH INSERTION OF UROLIFT;  Surgeon: Sherrilee Belvie CROME, MD;  Location: [facility];  Service: Urology;  Laterality: [name];   EYE SURGERY     LUMBAR FUSION  1998 and 2000   l5 to s1 1998, then l4 and l5 in 2000   SPINE SURGERY     H. Elsner, L4-5 & L5-S1   TRANSURETHRAL RESECTION OF PROSTATE [name] 04/22/2023   Procedure: TRANSURETHRAL RESECTION OF THE PROSTATE (TURP);  Surgeon: Sherrilee Belvie CROME, MD;  Location: [facility];  Service: Urology;  Laterality: [name];   VENTRICULOPERITONEAL SHUNT  2006    Home Medications:  Allergies as of 04/28/2023       Reactions   Lisinopril  Cough   Latex Itching, Rash        Medication List        Accurate as of April 28, 2023  9:08 AM. If you have any questions, ask your nurse or doctor.          ACETYL L-CARNITINE PO Take 500 mg by mouth in the morning.   acidophilus Caps capsule Take 1 capsule by mouth in the morning.   Alpha-Lipoic Acid 300 MG Caps Take 300 mg by mouth 2 (two) times daily.   amLODipine  10 MG tablet Commonly known as: NORVASC  Take 10 mg by mouth in the morning.   ASTRAGALUS PO Take 470 mg by mouth every evening.   atorvastatin  80 MG tablet Commonly known as: LIPITOR  Take 80 mg by mouth every evening.   B-100 PO Take 1 tablet by mouth in the morning.   carvedilol  3.125 MG tablet Commonly known as: COREG  Take 3.125 mg by mouth 2 (two) times daily with a meal.   Contour Next Test test strip Generic drug: glucose blood 2 (two) times daily.   CoQ-10 100 MG capsule  Take 100 mg by mouth at bedtime.   folic acid 1 MG tablet Commonly known as: FOLVITE Take 1 mg by mouth in the morning.   GRAPE SEED EXTRACT PO Take 100 mg by mouth every evening.   GREEN TEA PO Take 400 mg by mouth every evening.   L-METHYLFOLATE PO Take 1,000 mcg by mouth in the morning.   levocetirizine 5 MG tablet Commonly known as: XYZAL  Take 1 tablet (5 mg total) by mouth every evening. What changed: when to take this   meclizine  25 MG tablet Commonly known as: ANTIVERT  Take 1 tablet (25 mg total) by mouth 3 (three) times daily as needed for dizziness.   methotrexate 2.5 MG tablet Commonly known as: RHEUMATREX Take 20 mg by mouth every Thursday. In the morning.   multivitamin with minerals Tabs tablet Take 1 tablet by mouth every evening.   NovoLOG  100 UNIT/ML injection Generic drug: insulin  aspart INJECT  UP TO 100 UNITS DAILY MAXIMUM DOSE WITH INSULIN  PUMP DAILY   OMEGA III EPA+DHA PO Take 1 capsule by mouth in the morning.   ondansetron  8 MG disintegrating tablet Commonly known as: Zofran  ODT 8mg  ODT q6 hours prn nausea   REFRESH OP Apply 1-2 drops to eye every 3 (three) hours as needed (dryness).   RESVERATROL PO Take 1 capsule by mouth every evening.   telmisartan  40 MG tablet Commonly known as: MICARDIS  Take 40 mg by mouth in the morning.   traMADol  50 MG tablet Commonly known as: Ultram  Take 1 tablet (50 mg total) by mouth every 6 (six) hours as needed.   VITAMIN C PO Take 1 tablet by mouth in the morning.   Vitamin D 50 MCG (2000 UT) Caps Take 2,000 Units by mouth in the morning.        Allergies:  Allergies  Allergen Reactions   Lisinopril  Cough   Latex Itching and Rash    Family History: Family History  Problem Relation Age of Onset   Hypertension Mother    Hyperlipidemia Mother    Diabetes Mother    Diabetes Father     Social History:  reports that he has never smoked. He has never used smokeless tobacco. He reports that he does not drink alcohol and does not use drugs.  ROS: All other review of systems were reviewed and are negative except what is noted above in HPI  Physical Exam: BP (!) 165/75   Pulse 67   Constitutional:  Alert and oriented, No acute distress. HEENT: [place] AT, moist mucus membranes.  Trachea midline, no masses. Cardiovascular: No clubbing, cyanosis, or edema. Respiratory: Normal respiratory effort, no increased work of breathing. GI: Abdomen is soft, nontender, nondistended, no abdominal masses GU: No CVA tenderness.  Lymph: No cervical or inguinal lymphadenopathy. Skin: No rashes, bruises or suspicious lesions. Neurologic: Grossly intact, no focal deficits, moving all 4 extremities. Psychiatric: Normal mood and affect.  Laboratory Data: Lab Results  Component Value Date   WBC 17.3 (H) 04/23/2023   HGB 10.4 (L)  04/23/2023   HCT 30.2 (L) 04/23/2023   MCV 101.0 (H) 04/23/2023   PLT 253 04/23/2023    Lab Results  Component Value Date   CREATININE 0.66 04/23/2023    No results found for: PSA  No results found for: TESTOSTERONE  Lab Results  Component Value Date   HGBA1C 6.0 (H) 12/11/2022    Urinalysis    Component Value Date/Time   COLORURINE YELLOW 08/16/2015 0244   APPEARANCEUR Clear 02/26/2023 1051  LABSPEC 1.029 08/16/2015 0244   PHURINE 5.5 08/16/2015 0244   GLUCOSEU Negative 02/26/2023 1051   HGBUR NEGATIVE 08/16/2015 0244   BILIRUBINUR Negative 02/26/2023 1051   KETONESUR >80 (A) 08/16/2015 0244   PROTEINUR Negative 02/26/2023 1051   PROTEINUR NEGATIVE 08/16/2015 0244   UROBILINOGEN 0.2 04/09/2014 1947   NITRITE Negative 02/26/2023 1051   NITRITE NEGATIVE 08/16/2015 0244   LEUKOCYTESUR Negative 02/26/2023 1051    Lab Results  Component Value Date   LABMICR Comment 02/26/2023   BACTERIA RARE (A) 08/16/2015    Pertinent Imaging:  Results for orders placed during the hospital encounter of 05/23/10  DG Abd 1 View  Narrative *RADIOLOGY REPORT*  Clinical Data: Weakness with nausea and vomiting.  ABDOMEN - 1 VIEW  Comparison: 02/10/2010.  Findings: Ventriculoperitoneal shunt tubing extends into the right mid abdomen.  There is no shunt discontinuity.  Moderate stool is noted throughout the colon.  The bowel gas pattern is nonobstructive.  There are postsurgical changes of the lower lumbar spine status post fusion.  IMPRESSION:  1.  Moderate stool throughout the colon suggesting constipation. 2.  Intact ventricular peritoneal shunt.  Original Report Authenticated By: ELSIE WENDI PERONE, M.D.  No results found for this or any previous visit.  No results found for this or any previous visit.  No results found for this or any previous visit.  No results found for this or any previous visit.  No results found for this or any previous visit.  No  results found for this or any previous visit.  No results found for this or any previous visit.   Assessment & Plan:    1. Benign localized prostatic hyperplasia with lower urinary tract symptoms (LUTS) (Primary) Followup 4-6 weeks with PVR - Bladder Voiding Trial - ciprofloxacin  (CIPRO ) tablet 500 mg   No follow-ups on file.  [name], MD  [facility] [place]

## 2023-04-30 NOTE — Anesthesia Postprocedure Evaluation (Signed)
 Anesthesia Post Note  Patient: Christopher Conner  Procedure(s) Performed: CYSTOSCOPY (Bladder) TRANSURETHRAL RESECTION OF THE PROSTATE (TURP) (Prostate)  Patient location during evaluation: Phase II Anesthesia Type: General Level of consciousness: awake Pain management: pain level controlled Vital Signs Assessment: post-procedure vital signs reviewed and stable Respiratory status: spontaneous breathing and respiratory function stable Cardiovascular status: blood pressure returned to baseline and stable Postop Assessment: no headache and no apparent nausea or vomiting Anesthetic complications: no Comments: Late entry   No notable events documented.   Last Vitals:  Vitals:   04/23/23 0421 04/23/23 1358  BP: (!) 134/57 (!) 111/48  Pulse: 63 (!) 59  Resp: 20 16  Temp: 36.5 C 36.6 C  SpO2: 97% 99%    Last Pain:  Vitals:   04/23/23 1358  TempSrc: Oral  PainSc:                  [name]

## 2023-05-04 ENCOUNTER — Other Ambulatory Visit: Payer: Self-pay | Admitting: Nurse Practitioner

## 2023-05-04 DIAGNOSIS — B353 Tinea pedis: Secondary | ICD-10-CM | POA: Diagnosis not present

## 2023-05-04 DIAGNOSIS — Z85828 Personal history of other malignant neoplasm of skin: Secondary | ICD-10-CM | POA: Diagnosis not present

## 2023-05-04 DIAGNOSIS — L821 Other seborrheic keratosis: Secondary | ICD-10-CM | POA: Diagnosis not present

## 2023-05-04 DIAGNOSIS — R202 Paresthesia of skin: Secondary | ICD-10-CM | POA: Diagnosis not present

## 2023-05-04 DIAGNOSIS — L814 Other melanin hyperpigmentation: Secondary | ICD-10-CM | POA: Diagnosis not present

## 2023-05-04 DIAGNOSIS — D485 Neoplasm of uncertain behavior of skin: Secondary | ICD-10-CM | POA: Diagnosis not present

## 2023-05-04 DIAGNOSIS — D225 Melanocytic nevi of trunk: Secondary | ICD-10-CM | POA: Diagnosis not present

## 2023-05-04 DIAGNOSIS — L570 Actinic keratosis: Secondary | ICD-10-CM | POA: Diagnosis not present

## 2023-05-04 DIAGNOSIS — Z08 Encounter for follow-up examination after completed treatment for malignant neoplasm: Secondary | ICD-10-CM | POA: Diagnosis not present

## 2023-05-07 ENCOUNTER — Ambulatory Visit: Payer: Medicare Other | Admitting: Podiatry

## 2023-05-11 NOTE — Discharge Summary (Signed)
Physician Discharge Summary  Patient ID: Christopher Conner MRN: [id number] DOB/AGE: 06/11/1950 73 y.o.  Admit date: 04/22/2023 Discharge date: 04/23/2023  Admission Diagnoses:  BPH (benign prostatic hyperplasia)  Discharge Diagnoses:  Principal Problem:   BPH (benign prostatic hyperplasia) Active Problems:   Benign prostatic hyperplasia with urinary frequency   Past Medical History:  Diagnosis Date   Arthritis    Bilateral foot-drop    after back surgery   BPH (benign prostatic hyperplasia)    Cataract    Chronic lymphocytic leukemia (HCC) early 2018   no current treatment   CLL (chronic lymphocytic leukemia) (HCC)    DM type 1 (diabetes mellitus, type 1) (HCC)    type 1   High cholesterol    Hypertension    Neuromuscular disorder (HCC)    diabetic neuropathy   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    Surgeries: Procedure(s): CYSTOSCOPY TRANSURETHRAL RESECTION OF THE PROSTATE (TURP) on 04/22/2023   Consultants (if any):   Discharged Condition: Improved  Hospital Course: SABIR SPORTSMAN is an 73 y.o. male who was admitted 04/22/2023 with a diagnosis of BPH (benign prostatic hyperplasia) and went to the operating room on 04/22/2023 and underwent the above named procedures.    He was given perioperative antibiotics:  Anti-infectives (From admission, onward)    Start     Dose/Rate Route Frequency Ordered Stop   04/22/23 0645  cefTRIAXone (ROCEPHIN) 2 g in sodium chloride 0.9 % 100 mL IVPB        2 g 200 mL/hr over 30 Minutes Intravenous  Once 04/22/23 0630 04/22/23 0759     .  He was given sequential compression devices, early ambulation for DVT prophylaxis.  He benefited maximally from the hospital stay and there were no complications.    Recent vital signs:  Vitals:   04/23/23 0421 04/23/23 1358  BP: (!) 134/57 (!) 111/48  Pulse: 63 (!) 59  Resp: 20 16  Temp: 97.7 F (36.5 C) 97.9 F (36.6 C)  SpO2: 97% 99%    Recent laboratory  studies:  Lab Results  Component Value Date   HGB 10.4 (L) 04/23/2023   HGB 11.8 (L) 04/22/2023   HGB 12.4 (L) 04/19/2023   Lab Results  Component Value Date   WBC 17.3 (H) 04/23/2023   PLT 253 04/23/2023   No results found for: "INR" Lab Results  Component Value Date   NA 131 (L) 04/23/2023   K 4.2 04/23/2023   CL 102 04/23/2023   CO2 25 04/23/2023   BUN 19 04/23/2023   CREATININE 0.66 04/23/2023   GLUCOSE 151 (H) 04/23/2023    Discharge Medications:   Allergies as of 04/23/2023       Reactions   Lisinopril Cough   Latex Itching, Rash        Medication List     STOP taking these medications    silodosin 8 MG Caps capsule Commonly known as: RAPAFLO       TAKE these medications    ACETYL L-CARNITINE PO Take 500 mg by mouth in the morning.   acidophilus Caps capsule Take 1 capsule by mouth in the morning.   Alpha-Lipoic Acid 300 MG Caps Take 300 mg by mouth 2 (two) times daily.   amLODipine 10 MG tablet Commonly known as: NORVASC Take 10 mg by mouth in the morning.   ASTRAGALUS PO Take 470 mg by mouth every evening.   atorvastatin 80 MG tablet Commonly known as: LIPITOR Take 80  mg by mouth every evening.   B-100 PO Take 1 tablet by mouth in the morning.   carvedilol 3.125 MG tablet Commonly known as: COREG Take 3.125 mg by mouth 2 (two) times daily with a meal.   Contour Next Test test strip Generic drug: glucose blood 2 (two) times daily.   CoQ-10 100 MG capsule Take 100 mg by mouth at bedtime.   folic acid 1 MG tablet Commonly known as: FOLVITE Take 1 mg by mouth in the morning.   GRAPE SEED EXTRACT PO Take 100 mg by mouth every evening.   GREEN TEA PO Take 400 mg by mouth every evening.   L-METHYLFOLATE PO Take 1,000 mcg by mouth in the morning.   meclizine 25 MG tablet Commonly known as: ANTIVERT Take 1 tablet (25 mg total) by mouth 3 (three) times daily as needed for dizziness.   methotrexate 2.5 MG tablet Commonly  known as: RHEUMATREX Take 20 mg by mouth every Thursday. In the morning.   multivitamin with minerals Tabs tablet Take 1 tablet by mouth every evening.   NovoLOG 100 UNIT/ML injection Generic drug: insulin aspart INJECT UP TO 100 UNITS DAILY MAXIMUM DOSE WITH INSULIN PUMP DAILY   OMEGA III EPA+DHA PO Take 1 capsule by mouth in the morning.   ondansetron 8 MG disintegrating tablet Commonly known as: Zofran ODT 8mg  ODT q6 hours prn nausea   REFRESH OP Apply 1-2 drops to eye every 3 (three) hours as needed (dryness).   RESVERATROL PO Take 1 capsule by mouth every evening.   telmisartan 40 MG tablet Commonly known as: MICARDIS Take 40 mg by mouth in the morning.   traMADol 50 MG tablet Commonly known as: Ultram Take 1 tablet (50 mg total) by mouth every 6 (six) hours as needed.   VITAMIN C PO Take 1 tablet by mouth in the morning.   Vitamin D 50 MCG (2000 UT) Caps Take 2,000 Units by mouth in the morning.        Diagnostic Studies: No results found.  Disposition: Discharge disposition: 01-Home or Self Care       Discharge Instructions     Discharge patient   Complete by: As directed    Discharge disposition: 01-Home or Self Care   Discharge patient date: 04/23/2023        Follow-up Information     Cathryne Mancebo, Mardene Celeste, MD Follow up on 04/28/2023.   Specialty: Urology Contact information: [address]  [place] [address] [phone]                  Signed: Wilkie Aye 05/11/2023, 8:05 AM

## 2023-05-13 DIAGNOSIS — M79643 Pain in unspecified hand: Secondary | ICD-10-CM | POA: Diagnosis not present

## 2023-05-13 DIAGNOSIS — Z79899 Other long term (current) drug therapy: Secondary | ICD-10-CM | POA: Diagnosis not present

## 2023-05-13 DIAGNOSIS — E109 Type 1 diabetes mellitus without complications: Secondary | ICD-10-CM | POA: Diagnosis not present

## 2023-05-13 DIAGNOSIS — L4050 Arthropathic psoriasis, unspecified: Secondary | ICD-10-CM | POA: Diagnosis not present

## 2023-05-13 DIAGNOSIS — L409 Psoriasis, unspecified: Secondary | ICD-10-CM | POA: Diagnosis not present

## 2023-05-13 DIAGNOSIS — M79642 Pain in left hand: Secondary | ICD-10-CM | POA: Diagnosis not present

## 2023-05-13 DIAGNOSIS — M79641 Pain in right hand: Secondary | ICD-10-CM | POA: Diagnosis not present

## 2023-05-13 DIAGNOSIS — C9190 Lymphoid leukemia, unspecified not having achieved remission: Secondary | ICD-10-CM | POA: Diagnosis not present

## 2023-05-13 DIAGNOSIS — M79646 Pain in unspecified finger(s): Secondary | ICD-10-CM | POA: Diagnosis not present

## 2023-05-13 DIAGNOSIS — M1990 Unspecified osteoarthritis, unspecified site: Secondary | ICD-10-CM | POA: Diagnosis not present

## 2023-05-13 DIAGNOSIS — M7989 Other specified soft tissue disorders: Secondary | ICD-10-CM | POA: Diagnosis not present

## 2023-05-14 ENCOUNTER — Ambulatory Visit (INDEPENDENT_AMBULATORY_CARE_PROVIDER_SITE_OTHER): Payer: Medicare Other | Admitting: Podiatry

## 2023-05-14 ENCOUNTER — Ambulatory Visit (INDEPENDENT_AMBULATORY_CARE_PROVIDER_SITE_OTHER): Payer: Medicare Other

## 2023-05-14 ENCOUNTER — Encounter: Payer: Self-pay | Admitting: Podiatry

## 2023-05-14 ENCOUNTER — Ambulatory Visit: Payer: Medicare Other | Admitting: Podiatry

## 2023-05-14 VITALS — Ht 68.0 in | Wt 170.0 lb

## 2023-05-14 DIAGNOSIS — M21371 Foot drop, right foot: Secondary | ICD-10-CM | POA: Diagnosis not present

## 2023-05-14 DIAGNOSIS — M21372 Foot drop, left foot: Secondary | ICD-10-CM

## 2023-05-14 DIAGNOSIS — Z9181 History of falling: Secondary | ICD-10-CM

## 2023-05-14 DIAGNOSIS — R2689 Other abnormalities of gait and mobility: Secondary | ICD-10-CM

## 2023-05-14 LAB — LAB REPORT - SCANNED
Calcium: 8.8
EGFR: 92

## 2023-05-14 MED ORDER — CLOTRIMAZOLE-BETAMETHASONE 1-0.05 % EX CREA: 1.0000 | TOPICAL_CREAM | Freq: Every day | CUTANEOUS | 0 refills | Status: DC

## 2023-05-14 NOTE — Progress Notes (Signed)
Subjective:  Patient ID: Christopher Conner, male    DOB: January 18, 1951,  MRN: [id number]  Chief Complaint  Patient presents with   foot drop    73 y.o. male presents with the above complaint.  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  Patient states that he wanted to discuss treatment options for his dropfoot if any.  He is a diabetic with history of CLL.  He states been dealing with this for quite some time has been wearing the dropfoot bracing but is still causing him to trip because his foot is not clearing the ground.  He denies any other acute complaints.  He denies any other issues.   Review of Systems: Negative except as noted in the HPI. Denies N/V/F/Ch.  Past Medical History:  Diagnosis Date   Arthritis    Bilateral foot-drop    after back surgery   BPH (benign prostatic hyperplasia)    Cataract    Chronic lymphocytic leukemia (HCC) early 2018   no current treatment   CLL (chronic lymphocytic leukemia) (HCC)    DM type 1 (diabetes mellitus, type 1) (HCC)    type 1   High cholesterol    Hypertension    Neuromuscular disorder (HCC)    diabetic neuropathy   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    Current Outpatient Medications:    Acetylcarnitine HCl (ACETYL L-CARNITINE PO), Take 500 mg by mouth in the morning., Disp: , Rfl:    acidophilus (RISAQUAD) CAPS capsule, Take 1 capsule by mouth in the morning., Disp: , Rfl:    Alpha-Lipoic Acid 300 MG CAPS, Take 300 mg by mouth 2 (two) times daily., Disp: , Rfl:    amLODipine (NORVASC) 10 MG tablet, Take 10 mg by mouth in the morning., Disp: , Rfl:    Ascorbic Acid (VITAMIN C PO), Take 1 tablet by mouth in the morning., Disp: , Rfl:    ASTRAGALUS PO, Take 470 mg by mouth every evening., Disp: , Rfl:    atorvastatin (LIPITOR) 80 MG tablet, Take 80 mg by mouth every evening., Disp: , Rfl:    carvedilol (COREG) 3.125 MG tablet, Take 3.125 mg by mouth 2  (two) times daily with a meal., Disp: , Rfl:    Cholecalciferol (VITAMIN D) 50 MCG (2000 UT) CAPS, Take 2,000 Units by mouth in the morning., Disp: , Rfl:    clotrimazole-betamethasone (LOTRISONE) cream, Apply 1 Application topically daily., Disp: 30 g, Rfl: 0   Coenzyme Q10 (COQ-10) 100 MG capsule, Take 100 mg by mouth at bedtime., Disp: , Rfl:    CONTOUR NEXT TEST test strip, 2 (two) times daily., Disp: , Rfl:    folic acid (FOLVITE) 1 MG tablet, Take 1 mg by mouth in the morning., Disp: , Rfl:    GRAPE SEED EXTRACT PO, Take 100 mg by mouth every evening., Disp: , Rfl:    Green Tea, Camellia sinensis, (GREEN TEA PO), Take 400 mg by mouth every evening., Disp: , Rfl:    L-METHYLFOLATE PO, Take 1,000 mcg by mouth in the morning., Disp: , Rfl:    levocetirizine (XYZAL) 5 MG tablet, TAKE ONE TABLET BY MOUTH EVERY EVENING, Disp: 90 tablet, Rfl: 1   meclizine (ANTIVERT) 25 MG tablet, Take 1 tablet (25 mg total) by mouth 3 (three) times daily as needed for dizziness., Disp: 30 tablet, Rfl: 0   methotrexate (RHEUMATREX) 2.5 MG tablet, Take 20 mg by mouth every Thursday. In  the morning., Disp: , Rfl:    Multiple Vitamin (MULTIVITAMIN WITH MINERALS) TABS tablet, Take 1 tablet by mouth every evening., Disp: , Rfl:    NOVOLOG 100 UNIT/ML injection, INJECT UP TO 100 UNITS DAILY MAXIMUM DOSE WITH INSULIN PUMP DAILY, Disp: , Rfl: 1   Omega-3 Fatty Acids (OMEGA III EPA+DHA PO), Take 1 capsule by mouth in the morning., Disp: , Rfl:    ondansetron (ZOFRAN ODT) 8 MG disintegrating tablet, 8mg  ODT q6 hours prn nausea, Disp: 8 tablet, Rfl: 0   Polyvinyl Alcohol-Povidone (REFRESH OP), Apply 1-2 drops to eye every 3 (three) hours as needed (dryness)., Disp: , Rfl:    RESVERATROL PO, Take 1 capsule by mouth every evening., Disp: , Rfl:    telmisartan (MICARDIS) 40 MG tablet, Take 40 mg by mouth in the morning., Disp: , Rfl:    Vitamins-Lipotropics (B-100 PO), Take 1 tablet by mouth in the morning., Disp: , Rfl:  No  current facility-administered medications for this visit.  Facility-Administered Medications Ordered in Other Visits:    water for irrigation, sterile for irrigation SOLN, , , PRN, McKenzie, Mardene Celeste, MD, 500 mL at 12/18/22 4098  Social History   Tobacco Use  Smoking Status Never  Smokeless Tobacco Never    Allergies  Allergen Reactions   Lisinopril Cough   Latex Itching and Rash   Objective:  There were no vitals filed for this visit. Body mass index is 25.85 kg/m. Constitutional Well developed. Well nourished.  Vascular Dorsalis pedis pulses palpable bilaterally. Posterior tibial pulses palpable bilaterally. Capillary refill normal to all digits.  No cyanosis or clubbing noted. Pedal hair growth normal.  Neurologic Normal speech. Oriented to person, place, and time. Epicritic sensation to light touch grossly present bilaterally.  Dermatologic Nails well groomed and normal in appearance. No open wounds. No skin lesions.  Orthopedic: Bilateral foot drop noted with weakness of the extensor tendon flexor tendons are 5 out of 5 bilaterally.  Bilateral dropfoot noted.   Radiographs: None Assessment:   1. Bilateral foot-drop   2. Antalgic gait   3. History of fall    Plan:  Patient was evaluated and treated and all questions answered.  Bilateral foot drop likely from back surgery with history of multiple fall -All of the concerns were discussed with the patient in extensive detail -Given the amount of dropfoot that is present he will build he will benefit from possibly more than balance bracing as he is also having a history of fall and antalgic gait due to it.  Dropfoot bracing/AFO bracing's are not giving him enough stability. -I believe she will benefit from seeing the orthotics department for bracing to help with the balance and prevent falls such as Moore balance bracing. -At this time I discussed with the patient that he is not an ideal surgical candidate as there  are not a good alternative procedures for rigid dropfoot.  I discussed with patient he states understanding would like to hold off on surgical procedure -Patient would like to do moore balance bracing as opposed to AFO bracing to give stability.

## 2023-06-02 ENCOUNTER — Ambulatory Visit (INDEPENDENT_AMBULATORY_CARE_PROVIDER_SITE_OTHER): Payer: Medicare Other | Admitting: Urology

## 2023-06-02 VITALS — BP 155/71 | HR 61

## 2023-06-02 DIAGNOSIS — N401 Enlarged prostate with lower urinary tract symptoms: Secondary | ICD-10-CM

## 2023-06-02 DIAGNOSIS — R351 Nocturia: Secondary | ICD-10-CM

## 2023-06-02 DIAGNOSIS — N400 Enlarged prostate without lower urinary tract symptoms: Secondary | ICD-10-CM

## 2023-06-02 DIAGNOSIS — R3915 Urgency of urination: Secondary | ICD-10-CM

## 2023-06-02 LAB — URINALYSIS, ROUTINE W REFLEX MICROSCOPIC
Bilirubin, UA: NEGATIVE
Glucose, UA: NEGATIVE
Ketones, UA: NEGATIVE
Nitrite, UA: NEGATIVE
Specific Gravity, UA: 1.015 (ref 1.005–1.030)
Urobilinogen, Ur: 0.2 mg/dL (ref 0.2–1.0)
pH, UA: 6.5 (ref 5.0–7.5)

## 2023-06-02 LAB — MICROSCOPIC EXAMINATION: WBC, UA: >30 /[HPF] — AB (ref 0–5)

## 2023-06-02 MED ORDER — MIRABEGRON ER 25 MG PO TB24: 25.0000 mg | ORAL_TABLET | Freq: Every day | ORAL | Status: DC

## 2023-06-02 NOTE — Progress Notes (Signed)
06/02/2023 10:01 AM   Christopher Conner 1950/05/19 [id number]  Referring provider: Tollie Eth, NP [address] [place],  [place] [address]  Urinary urgency   HPI: Christopher Conner is a 72yo here for followup for BPH with nocturia, urinary frequency and urgency. Nocturia is 3-4x after TURP. His daytime frequency has improved but his urgency has not improved. Urine stream is strong. No strianing to urinate   PMH: Past Medical History:  Diagnosis Date   Arthritis    Bilateral foot-drop    after back surgery   BPH (benign prostatic hyperplasia)    Cataract    Chronic lymphocytic leukemia (HCC) early 2018   no current treatment   CLL (chronic lymphocytic leukemia) (HCC)    DM type 1 (diabetes mellitus, type 1) (HCC)    type 1   High cholesterol    Hypertension    Neuromuscular disorder (HCC)    diabetic neuropathy   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    Surgical History: Past Surgical History:  Procedure Laterality Date   APPENDECTOMY  early 20's   BACK SURGERY     BRAIN SURGERY  10/30/04   brain shunt, Codman Hakim   CATARACT EXTRACTION Bilateral    left eye prostheisi "shell"   CYST REMOVAL NECK  yrs ago   CYSTOSCOPY [name] 04/22/2023   Procedure: CYSTOSCOPY;  Surgeon: Malen Gauze, MD;  Location: [facility];  Service: Urology;  Laterality: [name];   CYSTOSCOPY WITH INJECTION [name] 12/17/2022   Procedure: CYSTOSCOPY WITH INJECTION-Botox;  Surgeon: Malen Gauze, MD;  Location: [facility];  Service: Urology;  Laterality: [name];   CYSTOSCOPY WITH INSERTION OF UROLIFT  april/march 2020   done in office   CYSTOSCOPY WITH INSERTION OF UROLIFT [name] 10/12/2019   Procedure: CYSTOSCOPY WITH INSERTION OF UROLIFT;  Surgeon: Malen Gauze, MD;  Location: [facility];  Service: Urology;  Laterality: [name];   EYE SURGERY     LUMBAR FUSION  1998 and 2000   l5 to s1 1998, then l4 and l5 in 2000   SPINE SURGERY     H. Elsner, L4-5 & L5-S1    TRANSURETHRAL RESECTION OF PROSTATE [name] 04/22/2023   Procedure: TRANSURETHRAL RESECTION OF THE PROSTATE (TURP);  Surgeon: Malen Gauze, MD;  Location: [facility];  Service: Urology;  Laterality: [name];   VENTRICULOPERITONEAL SHUNT  2006    Home Medications:  Allergies as of 06/02/2023       Reactions   Lisinopril Cough   Latex Itching, Rash        Medication List        Accurate as of June 02, 2023 10:01 AM. If you have any questions, ask your nurse or doctor.          ACETYL L-CARNITINE PO Take 500 mg by mouth in the morning.   acidophilus Caps capsule Take 1 capsule by mouth in the morning.   Alpha-Lipoic Acid 300 MG Caps Take 300 mg by mouth 2 (two) times daily.   amLODipine 10 MG tablet Commonly known as: NORVASC Take 10 mg by mouth in the morning.   ASTRAGALUS PO Take 470 mg by mouth every evening.   atorvastatin 80 MG tablet Commonly known as: LIPITOR Take 80 mg by mouth every evening.   B-100 PO Take 1 tablet by mouth in the morning.   carvedilol 3.125 MG tablet Commonly known as: COREG Take 3.125 mg by mouth 2 (two) times daily with a meal.  clotrimazole-betamethasone cream Commonly known as: LOTRISONE Apply 1 Application topically daily.   Contour Next Test test strip Generic drug: glucose blood 2 (two) times daily.   CoQ-10 100 MG capsule Take 100 mg by mouth at bedtime.   folic acid 1 MG tablet Commonly known as: FOLVITE Take 1 mg by mouth in the morning.   GRAPE SEED EXTRACT PO Take 100 mg by mouth every evening.   GREEN TEA PO Take 400 mg by mouth every evening.   L-METHYLFOLATE PO Take 1,000 mcg by mouth in the morning.   levocetirizine 5 MG tablet Commonly known as: XYZAL TAKE ONE TABLET BY MOUTH EVERY EVENING   meclizine 25 MG tablet Commonly known as: ANTIVERT Take 1 tablet (25 mg total) by mouth 3 (three) times daily as needed for dizziness.   methotrexate 2.5 MG tablet Commonly known as: RHEUMATREX Take 20  mg by mouth every Thursday. In the morning.   multivitamin with minerals Tabs tablet Take 1 tablet by mouth every evening.   NovoLOG 100 UNIT/ML injection Generic drug: insulin aspart INJECT UP TO 100 UNITS DAILY MAXIMUM DOSE WITH INSULIN PUMP DAILY   OMEGA III EPA+DHA PO Take 1 capsule by mouth in the morning.   ondansetron 8 MG disintegrating tablet Commonly known as: Zofran ODT 8mg  ODT q6 hours prn nausea   REFRESH OP Apply 1-2 drops to eye every 3 (three) hours as needed (dryness).   RESVERATROL PO Take 1 capsule by mouth every evening.   telmisartan 40 MG tablet Commonly known as: MICARDIS Take 40 mg by mouth in the morning.   VITAMIN C PO Take 1 tablet by mouth in the morning.   Vitamin D 50 MCG (2000 UT) Caps Take 2,000 Units by mouth in the morning.        Allergies:  Allergies  Allergen Reactions   Lisinopril Cough   Latex Itching and Rash    Family History: Family History  Problem Relation Age of Onset   Hypertension Mother    Hyperlipidemia Mother    Diabetes Mother    Diabetes Father     Social History:  reports that he has never smoked. He has never used smokeless tobacco. He reports that he does not drink alcohol and does not use drugs.  ROS: All other review of systems were reviewed and are negative except what is noted above in HPI  Physical Exam: BP (!) 155/71   Pulse 61   Constitutional:  Alert and oriented, No acute distress. HEENT: [place] AT, moist mucus membranes.  Trachea midline, no masses. Cardiovascular: No clubbing, cyanosis, or edema. Respiratory: Normal respiratory effort, no increased work of breathing. GI: Abdomen is soft, nontender, nondistended, no abdominal masses GU: No CVA tenderness.  Lymph: No cervical or inguinal lymphadenopathy. Skin: No rashes, bruises or suspicious lesions. Neurologic: Grossly intact, no focal deficits, moving all 4 extremities. Psychiatric: Normal mood and affect.  Laboratory Data: Lab  Results  Component Value Date   WBC 17.3 (H) 04/23/2023   HGB 10.4 (L) 04/23/2023   HCT 30.2 (L) 04/23/2023   MCV 101.0 (H) 04/23/2023   PLT 253 04/23/2023    Lab Results  Component Value Date   CREATININE 0.66 04/23/2023    No results found for: "PSA"  No results found for: "TESTOSTERONE"  Lab Results  Component Value Date   HGBA1C 6.0 (H) 12/11/2022    Urinalysis    Component Value Date/Time   COLORURINE YELLOW 08/16/2015 0244   APPEARANCEUR Clear 02/26/2023 1051  LABSPEC 1.029 08/16/2015 0244   PHURINE 5.5 08/16/2015 0244   GLUCOSEU Negative 02/26/2023 1051   HGBUR NEGATIVE 08/16/2015 0244   BILIRUBINUR Negative 02/26/2023 1051   KETONESUR >80 (A) 08/16/2015 0244   PROTEINUR Negative 02/26/2023 1051   PROTEINUR NEGATIVE 08/16/2015 0244   UROBILINOGEN 0.2 04/09/2014 1947   NITRITE Negative 02/26/2023 1051   NITRITE NEGATIVE 08/16/2015 0244   LEUKOCYTESUR Negative 02/26/2023 1051    Lab Results  Component Value Date   LABMICR Comment 02/26/2023   BACTERIA RARE (A) 08/16/2015    Pertinent Imaging:  Results for orders placed during the hospital encounter of 05/23/10  DG Abd 1 View  Narrative *RADIOLOGY REPORT*  Clinical Data: Weakness with nausea and vomiting.  ABDOMEN - 1 VIEW  Comparison: 02/10/2010.  Findings: Ventriculoperitoneal shunt tubing extends into the right mid abdomen.  There is no shunt discontinuity.  Moderate stool is noted throughout the colon.  The bowel gas pattern is nonobstructive.  There are postsurgical changes of the lower lumbar spine status post fusion.  IMPRESSION:  1.  Moderate stool throughout the colon suggesting constipation. 2.  Intact ventricular peritoneal shunt.  Original Report Authenticated By: Gerrianne Scale, M.D.  No results found for this or any previous visit.  No results found for this or any previous visit.  No results found for this or any previous visit.  No results found for this or any  previous visit.  No results found for this or any previous visit.  No results found for this or any previous visit.  No results found for this or any previous visit.   Assessment & Plan:    1. Benign prostatic hyperplasia, unspecified whether lower urinary tract symptoms present (Primary) Followup 3 months with [facility] - BLADDER SCAN AMB NON-IMAGING - Urinalysis, Routine w reflex microscopic  2. Urinary urgency  3. Urinary frequency -improving after TURP  No follow-ups on file.  Wilkie Aye, MD  [facility] [place]

## 2023-06-02 NOTE — Progress Notes (Signed)
Bladder Scan completed today.  Patient can void prior to the bladder scan. Bladder scan result: 163

## 2023-06-04 LAB — URINE CULTURE

## 2023-06-08 ENCOUNTER — Encounter: Payer: Self-pay | Admitting: Urology

## 2023-06-08 NOTE — Patient Instructions (Signed)

## 2023-06-09 ENCOUNTER — Other Ambulatory Visit: Payer: Medicare Other

## 2023-06-17 ENCOUNTER — Other Ambulatory Visit: Payer: Self-pay

## 2023-06-17 DIAGNOSIS — C9110 Chronic lymphocytic leukemia of B-cell type not having achieved remission: Secondary | ICD-10-CM

## 2023-06-17 NOTE — Progress Notes (Signed)
 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 male here for his 1 year follow-up for continued evaluation and management of CLL. Patient was last seen by me on 06/12/2022 and reported bilateral neuropathy L>R, leg swelling, physical limitations due to shoulder surgery, removal of several spots on his left shoulder, head, right UE, and left LE, occasional night sweats, nocturia, and increased total urine output. Patient also reported some stress to a death in the family.   Patient had TURP surgery on 04/22/2023.   Today, he reports that he continues to heal from his recent TURP surgery. He notes that his catheter has been removed.   Patient is otherwise doing fairly well. He denies any infection issues,  unexplained fever, drenching night sweats, or abdominal pain.  Patient reports having chills at night sometimes even prior to his recent surgery.   He reports that he believes that he has lost some weight despite eating well. His weight in clinic today is 171 pounds.   Patient reports urination issues, which he describes as being close to incontinence issues. He reports needing to urinate a couple hours at a time, which has improved from previously. He is not on any diuretics at this time. He reports side effects from taking Myrbetriq to manage bladder spasms, which was ordered by his urologist.   Patient was noted to have hyponatremia managed by his endocrinologist.He reports haivng low sodium even prior to his TURP surgery. Patient reports that his sodium levels have been low over the past 2-3 years without a known cause. Patient tried Fludrocortisone previously which did not improve sodium levels. He  reports that he will see his endocrinologist, Christopher Conner, next on Wednesday, 06/23/2023. He generally follows with his endocrinologist twice a year.   Patient denies any heart issues or other kidney issues. He denies any alcohol use.   MEDICAL HISTORY:  Past Medical History:  Diagnosis Date   Arthritis    Bilateral foot-drop    after back surgery   BPH (benign prostatic hyperplasia)    Cataract    Chronic lymphocytic leukemia (HCC) early 2018   no current treatment   CLL (chronic lymphocytic leukemia) (HCC)    DM type 1 (diabetes mellitus, type 1) (HCC)    type 1   High cholesterol    Hypertension    Neuromuscular disorder (HCC)    diabetic neuropathy   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    SURGICAL HISTORY: Past Surgical History:  Procedure Laterality Date   APPENDECTOMY  early 20's   BACK SURGERY     BRAIN SURGERY  10/30/04   brain shunt, Codman Hakim   CATARACT EXTRACTION Bilateral    left eye prostheisi "shell"   CYST REMOVAL NECK  yrs ago   CYSTOSCOPY [name] 04/22/2023   Procedure: CYSTOSCOPY;  Surgeon: Christopher Gauze, Christopher Conner;  Location: [facility];  Service: Urology;  Laterality: [name];   CYSTOSCOPY WITH INJECTION [name] 12/17/2022   Procedure: CYSTOSCOPY WITH INJECTION-Botox;  Surgeon: Christopher Gauze, Christopher Conner;  Location: [facility];  Service: Urology;  Laterality: [name];   CYSTOSCOPY WITH INSERTION OF UROLIFT  april/march 2020   done in office   CYSTOSCOPY WITH INSERTION OF  UROLIFT [name] 10/12/2019   Procedure: CYSTOSCOPY WITH INSERTION OF UROLIFT;  Surgeon: Christopher Gauze, Christopher Conner;  Location: [facility];  Service: Urology;  Laterality: [name];   EYE SURGERY     LUMBAR FUSION  1998 and 2000   l5 to s1 1998, then l4 and l5 in 2000   SPINE SURGERY     Christopher Conner, L4-5 & L5-S1   TRANSURETHRAL RESECTION OF PROSTATE [name] 04/22/2023   Procedure: TRANSURETHRAL RESECTION OF THE PROSTATE (TURP);  Surgeon: Christopher Gauze, Christopher Conner;  Location: [facility];  Service:  Urology;  Laterality: [name];   VENTRICULOPERITONEAL SHUNT  2006    SOCIAL HISTORY: Social History   Socioeconomic History   Marital status: Married    Spouse name: Not on file   Number of children: Not on file   Years of education: Not on file   Highest education level: Not on file  Occupational History   Occupation: Engineer, structural    Comment: Management consultant (employer)  Tobacco Use   Smoking status: Never   Smokeless tobacco: Never  Vaping Use   Vaping status: Never Used  Substance and Sexual Activity   Alcohol use: No   Drug use: No   Sexual activity: Not Currently  Other Topics Concern   Not on file  Social History Narrative   Not on file   Social Drivers of Health   Financial Resource Strain: Not on file  Food Insecurity: No Food Insecurity (04/23/2023)   Hunger Vital Sign    Worried About Running Out of Food in the Last Year: Never true    Ran Out of Food in the Last Year: Never true  Transportation Needs: No Transportation Needs (04/23/2023)   PRAPARE - Administrator, Civil Service (Medical): No    Lack of Transportation (Non-Medical): No  Physical Activity: Not on file  Stress: Not on file  Social Connections: Socially Integrated (04/23/2023)   Social Connection and Isolation Panel [NHANES]    Frequency of Communication with Friends and Family: Three times a week    Frequency of Social Gatherings with Friends and Family: Three times a week    Attends Religious Services: More than 4 times per year    Active Member of Clubs or Organizations: Yes    Attends Banker Meetings: More than 4 times per year    Marital Status: Married  Catering manager Violence: Not At Risk (04/23/2023)   Humiliation, Afraid, Rape, and Kick questionnaire    Fear of Current or Ex-Partner: No    Emotionally Abused: No    Physically Abused: No    Sexually Abused: No    FAMILY HISTORY: Family History  Problem Relation Age of Onset   Hypertension Mother     Hyperlipidemia Mother    Diabetes Mother    Diabetes Father     ALLERGIES:  is allergic to lisinopril and latex.  MEDICATIONS:  Current Outpatient Medications  Medication Sig Dispense Refill   Acetylcarnitine HCl (ACETYL L-CARNITINE PO) Take 500 mg by mouth in the morning.     acidophilus (RISAQUAD) CAPS capsule Take 1 capsule by mouth in the morning.     Alpha-Lipoic Acid 300 MG CAPS Take 300 mg by mouth 2 (two) times daily.     amLODipine (NORVASC) 10 MG tablet Take 10 mg by mouth in the morning.     Ascorbic Acid (VITAMIN C PO) Take 1 tablet by mouth in the morning.     ASTRAGALUS PO Take 470  mg by mouth every evening.     atorvastatin (LIPITOR) 80 MG tablet Take 80 mg by mouth every evening.     carvedilol (COREG) 3.125 MG tablet Take 3.125 mg by mouth 2 (two) times daily with a meal.     Cholecalciferol (VITAMIN D) 50 MCG (2000 UT) CAPS Take 2,000 Units by mouth in the morning.     clotrimazole-betamethasone (LOTRISONE) cream Apply 1 Application topically daily. 30 g 0   Coenzyme Q10 (COQ-10) 100 MG capsule Take 100 mg by mouth at bedtime.     CONTOUR NEXT TEST test strip 2 (two) times daily.     folic acid (FOLVITE) 1 MG tablet Take 1 mg by mouth in the morning.     GRAPE SEED EXTRACT PO Take 100 mg by mouth every evening.     Green Tea, Camellia sinensis, (GREEN TEA PO) Take 400 mg by mouth every evening.     L-METHYLFOLATE PO Take 1,000 mcg by mouth in the morning.     levocetirizine (XYZAL) 5 MG tablet TAKE ONE TABLET BY MOUTH EVERY EVENING 90 tablet 1   meclizine (ANTIVERT) 25 MG tablet Take 1 tablet (25 mg total) by mouth 3 (three) times daily as needed for dizziness. 30 tablet 0   methotrexate (RHEUMATREX) 2.5 MG tablet Take 20 mg by mouth every Thursday. In the morning.     mirabegron ER (MYRBETRIQ) 25 MG TB24 tablet Take 1 tablet (25 mg total) by mouth daily.     Multiple Vitamin (MULTIVITAMIN WITH MINERALS) TABS tablet Take 1 tablet by mouth every evening.     NOVOLOG  100 UNIT/ML injection INJECT UP TO 100 UNITS DAILY MAXIMUM DOSE WITH INSULIN PUMP DAILY  1   Omega-3 Fatty Acids (OMEGA III EPA+DHA PO) Take 1 capsule by mouth in the morning.     ondansetron (ZOFRAN ODT) 8 MG disintegrating tablet 8mg  ODT q6 hours prn nausea 8 tablet 0   Polyvinyl Alcohol-Povidone (REFRESH OP) Apply 1-2 drops to eye every 3 (three) hours as needed (dryness).     RESVERATROL PO Take 1 capsule by mouth every evening.     telmisartan (MICARDIS) 40 MG tablet Take 40 mg by mouth in the morning.     Vitamins-Lipotropics (B-100 PO) Take 1 tablet by mouth in the morning.     No current facility-administered medications for this visit.   Facility-Administered Medications Ordered in Other Visits  Medication Dose Route Frequency Provider Last Rate Last Admin   water for irrigation, sterile for irrigation SOLN    PRN Christopher Binning Mardene Celeste, Christopher Conner   500 mL at 12/18/22 0810    REVIEW OF SYSTEMS:    10 Point review of Systems was done is negative except as noted above.   PHYSICAL EXAMINATION: ECOG PERFORMANCE STATUS: 1 - Symptomatic but completely ambulatory  Vitals:   06/18/23 1424  BP: (!) 163/60  Pulse: (!) 58  Resp: 14  Temp: (!) 97.5 F (36.4 C)  SpO2: 100%   Filed Weights   06/18/23 1424  Weight: 171 lb 1.6 oz (77.6 kg)   .Body mass index is 26.02 kg/m.   GENERAL:alert, in no acute distress and comfortable SKIN: no acute rashes, no significant lesions EYES: conjunctiva are pink and non-injected, sclera anicteric OROPHARYNX: MMM, no exudates, no oropharyngeal erythema or ulceration NECK: supple, no JVD LYMPH:  no palpable lymphadenopathy in the cervical, axillary or inguinal regions LUNGS: clear to auscultation b/l with normal respiratory effort HEART: regular rate & rhythm ABDOMEN:  normoactive bowel sounds ,  non tender, not distended. Extremity: no pedal edema PSYCH: alert & oriented x 3 with fluent speech NEURO: no focal motor/sensory deficits   LABORATORY  DATA:  I have reviewed the data as listed  .    Latest Ref Rng & Units 06/18/2023    1:40 PM 04/23/2023    4:36 AM 04/22/2023    9:28 AM  CBC  WBC 4.0 - 10.5 K/uL 17.0  17.3  13.7   Hemoglobin 13.0 - 17.0 g/dL 16.1  09.6  04.5   Hematocrit 39.0 - 52.0 % 37.9  30.2  35.2   Platelets 150 - 400 K/uL 299  253  261    . CBC    Component Value Date/Time   WBC 17.0 (H) 06/18/2023 1340   WBC 17.3 (H) 04/23/2023 0436   RBC 3.81 (L) 06/18/2023 1340   HGB 12.7 (L) 06/18/2023 1340   HGB 12.9 (L) 02/09/2017 1302   HCT 37.9 (L) 06/18/2023 1340   HCT 38.6 02/09/2017 1302   PLT 299 06/18/2023 1340   PLT 290 02/09/2017 1302   MCV 99.5 06/18/2023 1340   MCV 97.0 02/09/2017 1302   MCH 33.3 06/18/2023 1340   MCHC 33.5 06/18/2023 1340   RDW 12.9 06/18/2023 1340   RDW 12.7 02/09/2017 1302   LYMPHSABS 8.8 (H) 06/18/2023 1340   LYMPHSABS 12.9 (H) 02/09/2017 1302   MONOABS 0.7 06/18/2023 1340   MONOABS 0.7 02/09/2017 1302   EOSABS 0.3 06/18/2023 1340   EOSABS 0.2 02/09/2017 1302   BASOSABS 0.0 06/18/2023 1340   BASOSABS 0.0 02/09/2017 1302        Latest Ref Rng & Units 06/18/2023    1:40 PM 05/13/2023   12:00 AM 04/23/2023    4:36 AM  CMP  Glucose 70 - 99 mg/dL 99   409   BUN 8 - 23 mg/dL 23   19   Creatinine 8.11 - 1.24 mg/dL 9.14   7.82   Sodium 956 - 145 mmol/L 130   131   Potassium 3.5 - 5.1 mmol/L 4.9   4.2   Chloride 98 - 111 mmol/L 97   102   CO2 22 - 32 mmol/L 30   25   Calcium 8.9 - 10.3 mg/dL 9.3  8.8     8.4   Total Protein 6.5 - 8.1 g/dL 6.7     Total Bilirubin 0.0 - 1.2 mg/dL 0.5     Alkaline Phos 38 - 126 U/L 66     AST 15 - 41 U/L 31     ALT 0 - 44 U/L 27        This result is from an external source.  . Lab Results  Component Value Date   LDH 188 06/18/2023         ASSESSMENT & PLAN:   Mr Hilleary is a very pleasant 73 y.o.  Caucasian gentleman with  1) Rai Stage 0 CLL with lymphocytosis with no overt LNadenopathy or palpable hepatosplenomegaly. On  presentation Absolute lymphocyte count of 8.8k with about 79 percent clonal lymphocytes on flow cytometry consistent with CLL. nl Hgb and platelets.  02/09/2017 labs, the patient's hbg is at 12.9, plt improved to 290k and WBC count of 19.6 with absolute lymphocyte count at 12.9.   CLL prognostic FISH panel neg for commonly tested mutations. No constitutional symptoms. No new lymphadenopathy.  2) skin cancer screening and management.  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  PLAN:  -Patient did have TURP procedure on his  prostate on January 2nd, 2025 -Discussed lab results on 06/18/2023 in detail with patient. CBC showed WBC of 17.0K, hemoglobin of 12.7, and platelets of 299K. -his hgb has improved with no concern for anemia -platelets normal -lymphocytes remain unchanged -did not feel any enlarged lymph nodes during physical examination -no clinical sign or lab evidence of CLL progression at this time -he is hyponatremic with sodium level of 130 mmol/L. His sodium level is stable and not significantly concerning at these levels -discussed that the chance of his CLL causing low sodium is low -discussed that if there is concern of  persistent hyponatremia, there would be a role to check thyroid and cortisone levels, among other testing -continue to follow up with endocrinologist to manage hyponatremia. -advised patient to connect with his PCP to evaluate whether his hyponatremia may be related to hormonal change, medication, or other factors -in regards to his urinary issues, I discussed that sometimes there is an element of post obstructive diurresis, though other factors that need to be considered in regards to his urinary issues as well -discussed that certain supplements can cause inappropriate secretion of ADH -eduated patient that Green tea causes increased urination and weight loss -discussed that general tea intake is also a diuretic  -okay to stop green tea  extract for 1-2 months see if things stabilize after his urinary obstruction issues are taken care of -answered all of patient's questions in detail -patient shall return to clinic in one year  FOLLOW-UP: RTC with Dr Candise Che with labs in 12 months  The total time spent in the appointment was 30 minutes* .  All of the patient's questions were answered with apparent satisfaction. The patient knows to call the clinic with any problems, questions or concerns.   Wyvonnia Lora MD MS AAHIVMS [facility] Hematology/Oncology Physician [facility]  .*Total Encounter Time as defined by the Centers for Medicare and Medicaid Services includes, in addition to the face-to-face time of a patient visit (documented in the note above) non-face-to-face time: obtaining and reviewing outside history, ordering and reviewing medications, tests or procedures, care coordination (communications with other health care professionals or caregivers) and documentation in the medical record.    I,Mitra Faeizi,acting as a Neurosurgeon for Wyvonnia Lora, Christopher Conner.,have documented all relevant documentation on the behalf of Wyvonnia Lora, Christopher Conner,as directed by  Wyvonnia Lora, Christopher Conner while in the presence of Wyvonnia Lora, Christopher Conner.  .I have reviewed the above documentation for accuracy and completeness, and I agree with the above. Johney Maine Christopher Conner

## 2023-06-18 ENCOUNTER — Inpatient Hospital Stay: Payer: Medicare Other | Admitting: Hematology

## 2023-06-18 ENCOUNTER — Inpatient Hospital Stay: Payer: Medicare Other | Attending: Hematology

## 2023-06-18 VITALS — BP 163/60 | HR 58 | Temp 97.5°F | Resp 14 | Ht 68.0 in | Wt 171.1 lb

## 2023-06-18 DIAGNOSIS — C9110 Chronic lymphocytic leukemia of B-cell type not having achieved remission: Secondary | ICD-10-CM | POA: Diagnosis not present

## 2023-06-18 LAB — CBC WITH DIFFERENTIAL (CANCER CENTER ONLY)
Abs Immature Granulocytes: 0 10*3/uL (ref 0.00–0.07)
Basophils Absolute: 0 10*3/uL (ref 0.0–0.1)
Basophils Relative: 0 %
Eosinophils Absolute: 0.3 10*3/uL (ref 0.0–0.5)
Eosinophils Relative: 2 %
HCT: 37.9 % — ABNORMAL LOW (ref 39.0–52.0)
Hemoglobin: 12.7 g/dL — ABNORMAL LOW (ref 13.0–17.0)
Lymphocytes Relative: 52 %
Lymphs Abs: 8.8 10*3/uL — ABNORMAL HIGH (ref 0.7–4.0)
MCH: 33.3 pg (ref 26.0–34.0)
MCHC: 33.5 g/dL (ref 30.0–36.0)
MCV: 99.5 fL (ref 80.0–100.0)
Monocytes Absolute: 0.7 10*3/uL (ref 0.1–1.0)
Monocytes Relative: 4 %
Neutro Abs: 7.1 10*3/uL (ref 1.7–7.7)
Neutrophils Relative %: 42 %
Platelet Count: 299 10*3/uL (ref 150–400)
RBC: 3.81 MIL/uL — ABNORMAL LOW (ref 4.22–5.81)
RDW: 12.9 % (ref 11.5–15.5)
Smear Review: NORMAL
WBC Count: 17 10*3/uL — ABNORMAL HIGH (ref 4.0–10.5)
nRBC: 0 % (ref 0.0–0.2)

## 2023-06-18 LAB — CMP (CANCER CENTER ONLY)
ALT: 27 U/L (ref 0–44)
AST: 31 U/L (ref 15–41)
Albumin: 4.2 g/dL (ref 3.5–5.0)
Alkaline Phosphatase: 66 U/L (ref 38–126)
Anion gap: 3 — ABNORMAL LOW (ref 5–15)
BUN: 23 mg/dL (ref 8–23)
CO2: 30 mmol/L (ref 22–32)
Calcium: 9.3 mg/dL (ref 8.9–10.3)
Chloride: 97 mmol/L — ABNORMAL LOW (ref 98–111)
Creatinine: 0.8 mg/dL (ref 0.61–1.24)
GFR, Estimated: >60 mL/min (ref >60)
Glucose, Bld: 99 mg/dL (ref 70–99)
Potassium: 4.9 mmol/L (ref 3.5–5.1)
Sodium: 130 mmol/L — ABNORMAL LOW (ref 135–145)
Total Bilirubin: 0.5 mg/dL (ref 0.0–1.2)
Total Protein: 6.7 g/dL (ref 6.5–8.1)

## 2023-06-18 LAB — LACTATE DEHYDROGENASE: LDH: 188 U/L (ref 98–192)

## 2023-06-21 ENCOUNTER — Ambulatory Visit: Payer: Self-pay | Admitting: Nurse Practitioner

## 2023-06-21 ENCOUNTER — Telehealth: Payer: Self-pay | Admitting: Nurse Practitioner

## 2023-06-21 NOTE — Telephone Encounter (Signed)
 Patient was called and triaged. Patient was set up for video visit for 06/22/2023 with provider prior to triage call.

## 2023-06-21 NOTE — Telephone Encounter (Signed)
 Chief Complaint: Cough Symptoms: dry cough-worse at night Frequency: started this past Friday Pertinent Negatives: Patient denies CP, SOB Disposition: [] ED /[] Urgent Care (no appt availability in office) / [x] Appointment(In office/virtual)/ []  [place] [facility]/ [] Home Care/ [] Refused Recommended Disposition /[] [place] [facility]/ []  Follow-up with PCP Additional Notes: patient called initially to request medication for cough. Patient states nonproductive dry cough started on Friday. Patient states cough is worse at night. Reports no fever, CP or SOB. Patient made a video visit tomorrow with provider. Per protocol, this would have been the recommendation to be seen in 24 hours. Patient verbalized understanding of plan and all questions answered.     Reason for Disposition  [1] Continuous (nonstop) coughing interferes with work or school AND [2] no improvement using cough treatment per Care Advice  Answer Assessment - Initial Assessment Questions 1. ONSET: "When did the cough begin?"      Started Friday 2. SEVERITY: "How bad is the cough today?"      Cough is worse at night-cough isn't bad during the day 3. SPUTUM: "Describe the color of your sputum" (none, dry cough; clear, white, yellow, green)     Dry cough-nonproductive 4. HEMOPTYSIS: "Are you coughing up any blood?" If so ask: "How much?" (flecks, streaks, tablespoons, etc.)     No 5. DIFFICULTY BREATHING: "Are you having difficulty breathing?" If Yes, ask: "How bad is it?" (e.g., mild, moderate, severe)    - MILD: No SOB at rest, mild SOB with walking, speaks normally in sentences, can lie down, no retractions, pulse < 100.    - MODERATE: SOB at rest, SOB with minimal exertion and prefers to sit, cannot lie down flat, speaks in phrases, mild retractions, audible wheezing, pulse 100-120.    - SEVERE: Very SOB at rest, speaks in single words, struggling to breathe, sitting hunched forward, retractions, pulse > 120      No 6.  FEVER: "Do you have a fever?" If Yes, ask: "What is your temperature, how was it measured, and when did it start?"     No 7. CARDIAC HISTORY: "Do you have any history of heart disease?" (e.g., heart attack, congestive heart failure)      No 8. LUNG HISTORY: "Do you have any history of lung disease?"  (e.g., pulmonary embolus, asthma, emphysema)     No 9. PE RISK FACTORS: "Do you have a history of blood clots?" (or: recent major surgery, recent prolonged travel, bedridden)     No 10. OTHER SYMPTOMS: "Do you have any other symptoms?" (e.g., runny nose, wheezing, chest pain)      runny nose 12. TRAVEL: "Have you traveled out of the country in the last month?" (e.g., travel history, exposures)       No  Protocols used: Cough - Acute Non-Productive-A-AH

## 2023-06-21 NOTE — Telephone Encounter (Unsigned)
 Copied from CRM [id number]. Topic: Clinical - Medication Refill >> Jun 21, 2023  9:52 AM Christopher Conner wrote: Most Recent Primary Care Visit:  Provider: EARLY, SARA E  Department: [facility]  Visit Type: NEW PATIENT 45  Date: 10/19/2022  Medication: Patient is requesting medication for cough, light wheezing, cant lay down due to coughing badly.  Has the patient contacted their pharmacy? No (Agent: If no, request that the patient contact the pharmacy for the refill. If patient does not wish to contact the pharmacy document the reason why and proceed with request.) No appointment available and patient would like something called in   (Agent: If yes, when and what did the pharmacy advise?)  Is this the correct pharmacy for this prescription? Yes If no, delete pharmacy and type the correct one.  This is the patient's preferred pharmacy:   [facility] [id number] [place], [place] - [address] [place] [address] Phone: [phone] Fax: [phone]   Has the prescription been filled recently? No  Is the patient out of the medication? Yes  Has the patient been seen for an appointment in the last year OR does the patient have an upcoming appointment? Yes  Can we respond through MyChart? Yes  Agent: Please be advised that Rx refills may take up to 3 business days. We ask that you follow-up with your pharmacy.

## 2023-06-22 ENCOUNTER — Telehealth (INDEPENDENT_AMBULATORY_CARE_PROVIDER_SITE_OTHER): Admitting: Medical

## 2023-06-22 ENCOUNTER — Encounter: Payer: Self-pay | Admitting: Medical

## 2023-06-22 VITALS — Ht 68.0 in

## 2023-06-22 DIAGNOSIS — R062 Wheezing: Secondary | ICD-10-CM | POA: Diagnosis not present

## 2023-06-22 DIAGNOSIS — E1069 Type 1 diabetes mellitus with other specified complication: Secondary | ICD-10-CM

## 2023-06-22 DIAGNOSIS — R051 Acute cough: Secondary | ICD-10-CM

## 2023-06-22 DIAGNOSIS — C9110 Chronic lymphocytic leukemia of B-cell type not having achieved remission: Secondary | ICD-10-CM

## 2023-06-22 MED ORDER — ALBUTEROL SULFATE HFA 108 (90 BASE) MCG/ACT IN AERS: 2.0000 | INHALATION_SPRAY | Freq: Four times a day (QID) | RESPIRATORY_TRACT | 0 refills | Status: DC | PRN

## 2023-06-22 MED ORDER — HYDROCODONE BIT-HOMATROP MBR 5-1.5 MG/5ML PO SOLN: 5.0000 mL | Freq: Three times a day (TID) | ORAL | 0 refills | Status: DC | PRN

## 2023-06-22 NOTE — Progress Notes (Signed)
 Subjective:     Patient ID: Christopher Conner, male   DOB: [birth year], 73 y.o.   MRN: [id number]  This visit type was conducted due to national recommendations for restrictions regarding the COVID-19 Pandemic (e.g. social distancing) in an effort to limit this patient's exposure and mitigate transmission in our community.  Due to their co-morbid illnesses, this patient is at least at moderate risk for complications without adequate follow up.  This format is felt to be most appropriate for this patient at this time.    Documentation for virtual audio and video telecommunications through [facility] encounter:  The patient was located at home. The provider was located in the office. The patient did consent to this visit and is aware of possible charges through their insurance for this visit.  The other persons participating in this telemedicine service were none. Time spent on call was 20 minutes and in review of previous records 20 minutes total.  This virtual service is not related to other E/M service within previous 7 days.   HPI Chief Complaint  Patient presents with   Cough    VIRTUAL cough that started Friday. Wheezing, coughing fits when he lays down. Has significantly and rapidly worsened starting Saturday. Very intense. No home covid tests.    Virtual consult for cough.  Started 5 days ago.  He has been about severe hacking cough at times, nothing is coming up nonproductive.  He does hear wheezing and he cannot lie flat at night.  He does not normally have cough and shortness of breath problems.  No history of asthma.  Non-smoker.  He thinks he may have come down with a respiratory illness but it is not improving in regards to cough.  He does not have a whole lot of other symptoms.  He has had some runny nose but no sore throat, no sinus pressure, no headache, no bodyaches, no nausea or vomiting, no fever.  Given his underlying health issues including diabetes and CLL, he was worried  about it getting worse.  He wants to try to cut down on this ASAP.  Over-the-counter remedies are not working  No other aggravating or relieving factors. No other complaint.  Past Medical History:  Diagnosis Date   Arthritis    Bilateral foot-drop    after back surgery   BPH (benign prostatic hyperplasia)    Cataract    Chronic lymphocytic leukemia (HCC) early 2018   no current treatment   CLL (chronic lymphocytic leukemia) (HCC)    DM type 1 (diabetes mellitus, type 1) (HCC)    type 1   High cholesterol    Hypertension    Neuromuscular disorder (HCC)    diabetic neuropathy   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   Current Outpatient Medications on File Prior to Visit  Medication Sig Dispense Refill   Acetylcarnitine HCl (ACETYL L-CARNITINE PO) Take 500 mg by mouth in the morning.     acidophilus (RISAQUAD) CAPS capsule Take 1 capsule by mouth in the morning.     Alpha-Lipoic Acid 300 MG CAPS Take 300 mg by mouth 2 (two) times daily.     amLODipine (NORVASC) 10 MG tablet Take 10 mg by mouth in the morning.     Ascorbic Acid (VITAMIN C PO) Take 1 tablet by mouth in the morning.     ASTRAGALUS PO Take 470 mg by mouth every evening.     atorvastatin (LIPITOR) 80 MG tablet Take 80 mg by mouth  every evening.     carvedilol (COREG) 3.125 MG tablet Take 3.125 mg by mouth 2 (two) times daily with a meal.     Cholecalciferol (VITAMIN D) 50 MCG (2000 UT) CAPS Take 2,000 Units by mouth in the morning.     Coenzyme Q10 (COQ-10) 100 MG capsule Take 100 mg by mouth at bedtime.     CONTOUR NEXT TEST test strip 2 (two) times daily.     folic acid (FOLVITE) 1 MG tablet Take 1 mg by mouth in the morning.     GRAPE SEED EXTRACT PO Take 100 mg by mouth every evening.     L-METHYLFOLATE PO Take 1,000 mcg by mouth in the morning.     levocetirizine (XYZAL) 5 MG tablet TAKE ONE TABLET BY MOUTH EVERY EVENING 90 tablet 1   methotrexate (RHEUMATREX) 2.5 MG tablet Take 20 mg by  mouth every Thursday. In the morning.     mirabegron ER (MYRBETRIQ) 25 MG TB24 tablet Take 1 tablet (25 mg total) by mouth daily.     Multiple Vitamin (MULTIVITAMIN WITH MINERALS) TABS tablet Take 1 tablet by mouth every evening.     NOVOLOG 100 UNIT/ML injection INJECT UP TO 100 UNITS DAILY MAXIMUM DOSE WITH INSULIN PUMP DAILY  1   Omega-3 Fatty Acids (OMEGA III EPA+DHA PO) Take 1 capsule by mouth in the morning.     Polyvinyl Alcohol-Povidone (REFRESH OP) Apply 1-2 drops to eye every 3 (three) hours as needed (dryness).     telmisartan (MICARDIS) 40 MG tablet Take 40 mg by mouth in the morning.     Vitamins-Lipotropics (B-100 PO) Take 1 tablet by mouth in the morning.     clotrimazole-betamethasone (LOTRISONE) cream Apply 1 Application topically daily. (Patient not taking: Reported on 06/22/2023) 30 g 0   meclizine (ANTIVERT) 25 MG tablet Take 1 tablet (25 mg total) by mouth 3 (three) times daily as needed for dizziness. (Patient not taking: Reported on 06/22/2023) 30 tablet 0   ondansetron (ZOFRAN ODT) 8 MG disintegrating tablet 8mg  ODT q6 hours prn nausea (Patient not taking: Reported on 06/22/2023) 8 tablet 0   Current Facility-Administered Medications on File Prior to Visit  Medication Dose Route Frequency Provider Last Rate Last Admin   water for irrigation, sterile for irrigation SOLN    PRN Malen Gauze, MD   500 mL at 12/18/22 9811     Review of Systems As in subjective    Objective:   Physical Exam Due to coronavirus pandemic stay at home measures, patient visit was virtual and they were not examined in person.   Ht 5\' 8"  (1.727 m)   BMI 26.02 kg/m   General: Well-developed well-nourished no acute distress No labored breathing or wheezing but he does have several cough episodes     Assessment:     Encounter Diagnoses  Name Primary?   Acute cough Yes   Wheezing    CLL (chronic lymphocytic leukemia) (HCC)    Type 1 diabetes mellitus with other specified complication  (HCC)        Plan:     We discussed his symptoms, concerns, underlying health issues.  We discussed limitation of virtual consult.  Gave the option of getting a chest x-ray.  Order placed.  We discussed the likelihood of some bronchitis.  Begin medications below.  Discussed proper use of medications and risk and benefits of medications as below.  Discussed if no significant improvement within the next 48 hours we may consider steroids such  as prednisone or possibly even an antibiotic depending on symptoms.  Continue with good hydration and rest.  He voiced agreement and understanding of plan.  Christopher "[name]" was seen today for cough.  Diagnoses and all orders for this visit:  Acute cough -     DG Chest 2 View; Future  Wheezing -     DG Chest 2 View; Future  CLL (chronic lymphocytic leukemia) (HCC)  Type 1 diabetes mellitus with other specified complication (HCC)  Other orders -     HYDROcodone bit-homatropine (HYCODAN) 5-1.5 MG/5ML syrup; Take 5 mLs by mouth every 8 (eight) hours as needed for up to 5 days for cough. -     albuterol (VENTOLIN HFA) 108 (90 Base) MCG/ACT inhaler; Inhale 2 puffs into the lungs every 6 (six) hours as needed for wheezing or shortness of breath.  F/u prn

## 2023-06-23 ENCOUNTER — Ambulatory Visit: Admitting: Medical

## 2023-06-24 ENCOUNTER — Ambulatory Visit
Admission: RE | Admit: 2023-06-24 | Discharge: 2023-06-24 | Disposition: A | Discharge status: Home / Self Care | Source: Ambulatory Visit | Attending: Medical | Admitting: Medical

## 2023-06-24 DIAGNOSIS — R051 Acute cough: Secondary | ICD-10-CM

## 2023-06-24 DIAGNOSIS — R059 Cough, unspecified: Secondary | ICD-10-CM | POA: Diagnosis not present

## 2023-06-24 DIAGNOSIS — R062 Wheezing: Secondary | ICD-10-CM

## 2023-06-24 NOTE — Progress Notes (Signed)
 Results sent through MyChart

## 2023-06-24 NOTE — Progress Notes (Signed)
 It may be helpful to see you in person tomorrow.  It will be helpful to know your oxygen level and your pulse rate.  CT scan of chest is sometimes needed if there is concern for other more serious issues, but the x-ray does not show an obvious pneumonia or fluid collection in the lungs  I did mention if your symptoms persist we may need to use steroid to help calm down inflammation cough and/or possibly antibiotic if there is still a lot of mucus or sinus pressure  Keep in mind we are limited on virtual as it is not a replacement for listening to your lungs and getting some actual vital signs.  I believe I have some openings tomorrow but you would need to contact the office first thing in the morning.  I believe the phone lines open at 730 or you can possibly schedule through MyChart

## 2023-06-25 ENCOUNTER — Ambulatory Visit: Admitting: Medical

## 2023-06-25 VITALS — BP 130/80 | HR 62 | Temp 98.0°F | Resp 18 | Wt 174.0 lb

## 2023-06-25 DIAGNOSIS — R052 Subacute cough: Secondary | ICD-10-CM | POA: Diagnosis not present

## 2023-06-25 DIAGNOSIS — R0602 Shortness of breath: Secondary | ICD-10-CM

## 2023-06-25 DIAGNOSIS — R062 Wheezing: Secondary | ICD-10-CM

## 2023-06-25 DIAGNOSIS — R9431 Abnormal electrocardiogram [ECG] [EKG]: Secondary | ICD-10-CM

## 2023-06-25 DIAGNOSIS — R52 Pain, unspecified: Secondary | ICD-10-CM | POA: Diagnosis not present

## 2023-06-25 DIAGNOSIS — R051 Acute cough: Secondary | ICD-10-CM | POA: Diagnosis not present

## 2023-06-25 DIAGNOSIS — R6883 Chills (without fever): Secondary | ICD-10-CM | POA: Diagnosis not present

## 2023-06-25 LAB — POCT INFLUENZA A/B
Influenza A, POC: NEGATIVE
Influenza B, POC: NEGATIVE

## 2023-06-25 LAB — POC COVID19 BINAXNOW: SARS Coronavirus 2 Ag: NEGATIVE

## 2023-06-25 MED ORDER — BENZONATATE 200 MG PO CAPS: 200.0000 mg | ORAL_CAPSULE | Freq: Three times a day (TID) | ORAL | 0 refills | Status: DC | PRN

## 2023-06-25 MED ORDER — ALBUTEROL SULFATE (2.5 MG/3ML) 0.083% IN NEBU
2.5000 mg | INHALATION_SOLUTION | Freq: Once | RESPIRATORY_TRACT | Status: AC
  Administered 2023-06-25: 2.5 mg via RESPIRATORY_TRACT

## 2023-06-25 MED ORDER — AZITHROMYCIN 250 MG PO TABS
ORAL_TABLET | ORAL | 0 refills | Status: DC
Start: 2023-06-25 — End: 2023-07-15

## 2023-06-25 MED ORDER — TRELEGY ELLIPTA 100-62.5-25 MCG/ACT IN AEPB
1.0000 | INHALATION_SPRAY | Freq: Every day | RESPIRATORY_TRACT | Status: DC
Start: 2023-06-25 — End: 2023-07-15

## 2023-06-25 MED ORDER — PREDNISONE 20 MG PO TABS: ORAL_TABLET | ORAL | 0 refills | Status: DC

## 2023-06-25 NOTE — Progress Notes (Signed)
 Subjective:  Christopher Conner is a 73 y.o. male who presents for Chief Complaint  Patient presents with   Follow-up    Follow-up on cough and wheezing. Feels worse, symptoms going on since last Friday. Can't lay flat. Hears the wheezing in his ears. Having other flu like symptoms- body aches, chills, fever     Here for ongoing concerns with cough.  We did a virtual visit 3 days ago for the same.  Still has hacking cough.  Yesterday was a coughing nightmare and has had numerous days of this type of cough.  Can't lie down without hacking episode of cough.   Gets exhausted quickly.  Misplaced phone on the way here . Went upstairs to find phone, and just going up and down stairs felt out of breath.  This morning got some sputum with cough, but most of the time not seeing greenish yellows phlegm.  Blowing nose has increased significantly since recent virtual visit.  Mostly clear in blowing nose.  Lot of volume with nasal discharge.  He does note some chills on and off, slight fever at times.  He only had a fever up to 101 once.  Otherwise subjective fever.  No sore throat.  No ear pain.  No sinus pressure.  No headache.  No leg swelling or tenderness.  No calf swelling.  He does wear compression socks.  He notes that his wife is all of a sudden sick after being around him and that he thinks he has been kind of virus possibly or infection  He does have CLL and is on therapy for this, sees the cancer center.  No history of DVT or PE.  He notes that the Hycodan syrup has not really helped a whole lot.  Albuterol seems to help some.  No other aggravating or relieving factors.    No other c/o.  Past Medical History:  Diagnosis Date   Arthritis    Bilateral foot-drop    after back surgery   BPH (benign prostatic hyperplasia)    Cataract    Chronic lymphocytic leukemia (HCC) early 2018   no current treatment   CLL (chronic lymphocytic leukemia) (HCC)    DM type 1 (diabetes mellitus, type 1) (HCC)     type 1   High cholesterol    Hypertension    Neuromuscular disorder (HCC)    diabetic neuropathy   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   Current Outpatient Medications on File Prior to Visit  Medication Sig Dispense Refill   Acetylcarnitine HCl (ACETYL L-CARNITINE PO) Take 500 mg by mouth in the morning.     acidophilus (RISAQUAD) CAPS capsule Take 1 capsule by mouth in the morning.     albuterol (VENTOLIN HFA) 108 (90 Base) MCG/ACT inhaler Inhale 2 puffs into the lungs every 6 (six) hours as needed for wheezing or shortness of breath. 8 g 0   Alpha-Lipoic Acid 300 MG CAPS Take 300 mg by mouth 2 (two) times daily.     amLODipine (NORVASC) 10 MG tablet Take 10 mg by mouth in the morning.     Ascorbic Acid (VITAMIN C PO) Take 1 tablet by mouth in the morning.     ASTRAGALUS PO Take 470 mg by mouth every evening.     atorvastatin (LIPITOR) 80 MG tablet Take 80 mg by mouth every evening.     carvedilol (COREG) 3.125 MG tablet Take 3.125 mg by mouth 2 (two) times daily with a meal.  Cholecalciferol (VITAMIN D) 50 MCG (2000 UT) CAPS Take 2,000 Units by mouth in the morning.     clotrimazole-betamethasone (LOTRISONE) cream Apply 1 Application topically daily. 30 g 0   Coenzyme Q10 (COQ-10) 100 MG capsule Take 100 mg by mouth at bedtime.     CONTOUR NEXT TEST test strip 2 (two) times daily.     folic acid (FOLVITE) 1 MG tablet Take 1 mg by mouth in the morning.     GRAPE SEED EXTRACT PO Take 100 mg by mouth every evening.     L-METHYLFOLATE PO Take 1,000 mcg by mouth in the morning.     levocetirizine (XYZAL) 5 MG tablet TAKE ONE TABLET BY MOUTH EVERY EVENING 90 tablet 1   meclizine (ANTIVERT) 25 MG tablet Take 1 tablet (25 mg total) by mouth 3 (three) times daily as needed for dizziness. 30 tablet 0   methotrexate (RHEUMATREX) 2.5 MG tablet Take 20 mg by mouth every Thursday. In the morning.     mirabegron ER (MYRBETRIQ) 25 MG TB24 tablet Take 1 tablet (25 mg  total) by mouth daily.     Multiple Vitamin (MULTIVITAMIN WITH MINERALS) TABS tablet Take 1 tablet by mouth every evening.     NOVOLOG 100 UNIT/ML injection INJECT UP TO 100 UNITS DAILY MAXIMUM DOSE WITH INSULIN PUMP DAILY  1   Omega-3 Fatty Acids (OMEGA III EPA+DHA PO) Take 1 capsule by mouth in the morning.     ondansetron (ZOFRAN ODT) 8 MG disintegrating tablet 8mg  ODT q6 hours prn nausea 8 tablet 0   Polyvinyl Alcohol-Povidone (REFRESH OP) Apply 1-2 drops to eye every 3 (three) hours as needed (dryness).     telmisartan (MICARDIS) 40 MG tablet Take 40 mg by mouth in the morning.     Vitamins-Lipotropics (B-100 PO) Take 1 tablet by mouth in the morning.     Current Facility-Administered Medications on File Prior to Visit  Medication Dose Route Frequency Provider Last Rate Last Admin   water for irrigation, sterile for irrigation SOLN    PRN Ronne Binning Mardene Celeste, MD   500 mL at 12/18/22 6213     The following portions of the patient's history were reviewed and updated as appropriate: allergies, current medications, past family history, past medical history, past social history, past surgical history and problem list.  ROS Otherwise as in subjective above    Objective: BP 130/80   Pulse 62   Temp 98 F (36.7 C)   Resp 18   Wt 174 lb (78.9 kg)   SpO2 97%   BMI 26.46 kg/m   Wt Readings from Last 3 Encounters:  06/25/23 174 lb (78.9 kg)  06/18/23 171 lb 1.6 oz (77.6 kg)  05/14/23 170 lb (77.1 kg)    General appearance: alert, no distress, well developed, well nourished HEENT: normocephalic, sclerae anicteric, conjunctiva pink and moist, TMs WNL, nares patent, no discharge or erythema, pharynx with some post nasal drainage Oral cavity: MMM, no lesions Neck: supple, no lymphadenopathy, no thyromegaly, no masses, no JVD Heart: RRR, normal S1, S2, no murmurs Lungs: decreased breath sounds, faint wheezes, faint rhonchi, no rales Pulses: 2+ radial pulses, 2+ pedal pulses, normal  cap refill Ext: no edema No calve tendnerss or asymmetry Negative homans    Assessment: Encounter Diagnoses  Name Primary?   Acute cough Yes   Body aches    Chills    Wheezing    Subacute cough    SOB (shortness of breath)    Abnormal EKG  Plan: I reviewed over his chest x-ray from yesterday which showed no acute abnormality.  I reviewed his recent visit from 3 days ago with me virtually.  I reviewed 05/2023 labs.  Labs as below today  We gave 1 round of albuterol nebulized here in office today  We discussed differential.  Given the x-ray findings yesterday and given vital signs, no obvious bacterial or fungal pneumonia, no obvious heart failure, no collapsed lung.    Suspected etiology is still respiratory tract infection with reactive airway.  Sample of Trelegy given to help with inflammation and open up the airways this weekend.  Discussed proper use.  Stop Hydromet cough syrup as it is not helping and try Tessalon Perles instead.  Begin Z-Pak antibiotic given the purulent phlegm and nasal drainage in recent days.  Continue to hydrate well and rest.  Continue albuterol as needed since that has been helpful.  If not significantly improved in the next 36 hours then consider beginning to 3 days short prednisone.  We did discuss that it will increase blood sugars in the short-term  Cannot completely rule out PE but no sign of DVT in leg, no leg swelling, no hemoptysis, no tachycardia, pulse oxygen is normal.  If worse over the weekend go to the emergency department  I looked back and had an abnormal EKG in August 2024 compared to prior EKG 2001.  Referral to cardiology for consult on this   Christopher "[name]" was seen today for follow-up.  Diagnoses and all orders for this visit:  Acute cough -     POC COVID-19 -     POCT Influenza A/B  Body aches -     POC COVID-19 -     POCT Influenza A/B  Chills -     POC COVID-19 -     POCT Influenza A/B  Wheezing -     Brain  natriuretic peptide -     CBC with Differential/Platelet -     Basic metabolic panel -     albuterol (PROVENTIL) (2.5 MG/3ML) 0.083% nebulizer solution 2.5 mg  Subacute cough -     Brain natriuretic peptide -     CBC with Differential/Platelet -     Basic metabolic panel -     albuterol (PROVENTIL) (2.5 MG/3ML) 0.083% nebulizer solution 2.5 mg  SOB (shortness of breath) -     Brain natriuretic peptide -     albuterol (PROVENTIL) (2.5 MG/3ML) 0.083% nebulizer solution 2.5 mg  Abnormal EKG -     Ambulatory referral to Cardiology  Other orders -     benzonatate (TESSALON) 200 MG capsule; Take 1 capsule (200 mg total) by mouth 3 (three) times daily as needed for cough. -     azithromycin (ZITHROMAX) 250 MG tablet; 2 tablets day 1, then 1 tablet days 2-4 -     Fluticasone-Umeclidin-Vilant (TRELEGY ELLIPTA) 100-62.5-25 MCG/ACT AEPB; Inhale 1 puff into the lungs daily. -     predniSONE (DELTASONE) 20 MG tablet; 3 tablets today, 2 tablets tomorrow, 1 tablet the third day    Follow up: pending labs

## 2023-06-26 LAB — CBC WITH DIFFERENTIAL/PLATELET
Basophils Absolute: 0.1 10*3/uL (ref 0.0–0.2)
Basos: 0 %
EOS (ABSOLUTE): 0.2 10*3/uL (ref 0.0–0.4)
Eos: 2 %
Hematocrit: 36.5 % — ABNORMAL LOW (ref 37.5–51.0)
Hemoglobin: 12.5 g/dL — ABNORMAL LOW (ref 13.0–17.7)
Immature Grans (Abs): 0 10*3/uL (ref 0.0–0.1)
Immature Granulocytes: 0 %
Lymphocytes Absolute: 8.6 10*3/uL — ABNORMAL HIGH (ref 0.7–3.1)
Lymphs: 66 %
MCH: 33.2 pg — ABNORMAL HIGH (ref 26.6–33.0)
MCHC: 34.2 g/dL (ref 31.5–35.7)
MCV: 97 fL (ref 79–97)
Monocytes Absolute: 0.7 10*3/uL (ref 0.1–0.9)
Monocytes: 5 %
Neutrophils Absolute: 3.6 10*3/uL (ref 1.4–7.0)
Neutrophils: 27 %
Platelets: 278 10*3/uL (ref 150–450)
RBC: 3.77 x10E6/uL — ABNORMAL LOW (ref 4.14–5.80)
RDW: 11.9 % (ref 11.6–15.4)
WBC: 13.2 10*3/uL — ABNORMAL HIGH (ref 3.4–10.8)

## 2023-06-26 LAB — BASIC METABOLIC PANEL
BUN/Creatinine Ratio: 19 (ref 10–24)
BUN: 13 mg/dL (ref 8–27)
CO2: 23 mmol/L (ref 20–29)
Calcium: 8.7 mg/dL (ref 8.6–10.2)
Chloride: 93 mmol/L — ABNORMAL LOW (ref 96–106)
Creatinine, Ser: 0.7 mg/dL — ABNORMAL LOW (ref 0.76–1.27)
Glucose: 96 mg/dL (ref 70–99)
Potassium: 4.8 mmol/L (ref 3.5–5.2)
Sodium: 129 mmol/L — ABNORMAL LOW (ref 134–144)
eGFR: 98 mL/min/{1.73_m2} (ref >59)

## 2023-06-26 LAB — BRAIN NATRIURETIC PEPTIDE: BNP: 160.3 pg/mL — ABNORMAL HIGH (ref 0.0–100.0)

## 2023-06-27 ENCOUNTER — Other Ambulatory Visit: Payer: Self-pay | Admitting: Medical

## 2023-06-27 MED ORDER — FUROSEMIDE 20 MG PO TABS: 20.0000 mg | ORAL_TABLET | Freq: Every day | ORAL | 0 refills | Status: DC

## 2023-06-27 NOTE — Progress Notes (Signed)
 Results sent through MyChart

## 2023-06-28 DIAGNOSIS — E1021 Type 1 diabetes mellitus with diabetic nephropathy: Secondary | ICD-10-CM | POA: Diagnosis not present

## 2023-06-28 DIAGNOSIS — E871 Hypo-osmolality and hyponatremia: Secondary | ICD-10-CM | POA: Diagnosis not present

## 2023-06-28 DIAGNOSIS — I10 Essential (primary) hypertension: Secondary | ICD-10-CM | POA: Diagnosis not present

## 2023-06-28 DIAGNOSIS — Z9641 Presence of insulin pump (external) (internal): Secondary | ICD-10-CM | POA: Diagnosis not present

## 2023-06-29 DIAGNOSIS — E103593 Type 1 diabetes mellitus with proliferative diabetic retinopathy without macular edema, bilateral: Secondary | ICD-10-CM | POA: Diagnosis not present

## 2023-06-29 DIAGNOSIS — Z961 Presence of intraocular lens: Secondary | ICD-10-CM | POA: Diagnosis not present

## 2023-06-29 DIAGNOSIS — H52201 Unspecified astigmatism, right eye: Secondary | ICD-10-CM | POA: Diagnosis not present

## 2023-06-29 DIAGNOSIS — Z970 Presence of artificial eye: Secondary | ICD-10-CM | POA: Diagnosis not present

## 2023-06-29 LAB — HM DIABETES EYE EXAM

## 2023-06-30 ENCOUNTER — Telehealth: Payer: Self-pay

## 2023-06-30 ENCOUNTER — Ambulatory Visit: Payer: Medicare Other

## 2023-07-06 ENCOUNTER — Ambulatory Visit: Payer: Medicare Other

## 2023-07-06 VITALS — BP 118/68 | HR 65 | Temp 98.0°F | Ht 68.0 in | Wt 169.4 lb

## 2023-07-06 DIAGNOSIS — Z0000 Encounter for general adult medical examination without abnormal findings: Secondary | ICD-10-CM | POA: Diagnosis not present

## 2023-07-06 NOTE — Patient Instructions (Signed)
 [name] , Thank you for taking time to come for your Medicare Wellness Visit. I appreciate your ongoing commitment to your health goals. Please review the following plan we discussed and let me know if I can assist you in the future.   Referrals/Orders/Follow-Ups/Clinician Recommendations: none  This is a list of the screening recommended for you and due dates:  Health Maintenance  Topic Date Due   Complete foot exam   Never done   Yearly kidney health urinalysis for diabetes  Never done   Colon Cancer Screening  Never done   Hemoglobin A1C  06/13/2023   Eye exam for diabetics  07/14/2023   COVID-19 Vaccine (4 - Moderna risk 2024-25 season) 07/30/2023   Yearly kidney function blood test for diabetes  06/24/2024   Medicare Annual Wellness Visit  07/05/2024   DTaP/Tdap/Td vaccine (2 - Td or Tdap) 09/09/2031   Pneumonia Vaccine  Completed   Flu Shot  Completed   Hepatitis C Screening  Completed   Zoster (Shingles) Vaccine  Completed   HPV Vaccine  Aged Out    Advanced directives: (Copy Requested) Please bring a copy of your health care power of attorney and living will to the office to be added to your chart at your convenience. You can mail to [facility] [address] [place], [place] [address] or email to [email or web address]  Next Medicare Annual Wellness Visit scheduled for next year: Yes  insert Preventive Care attachment Insert FALL PREVENTION attachment if needed

## 2023-07-06 NOTE — Progress Notes (Signed)
 Subjective:   Christopher Conner is a 74 y.o. who presents for a Medicare Wellness preventive visit.  Visit Complete: In person    Persons Participating in Visit: n/a  AWV Questionnaire: No: Patient Medicare AWV questionnaire was not completed prior to this visit.  Cardiac Risk Factors include: advanced age (>49men, >47 women);diabetes mellitus;hypertension;male gender     Objective:    Today's Vitals   07/06/23 1514 07/06/23 1515  BP: 118/68   Pulse: 65   Temp: 98 F (36.7 C)   TempSrc: Oral   Weight: 169 lb 6.4 oz (76.8 kg)   Height: 5\' 8"  (1.727 m)   PainSc:  4    Body mass index is 25.76 kg/m.     07/06/2023    3:45 PM 04/22/2023    6:59 AM 04/19/2023   10:59 AM 12/17/2022    9:47 AM 10/12/2019    6:26 AM 08/16/2017    2:26 PM 08/15/2015    8:03 PM  Advanced Directives  Does Patient Have a Medical Advance Directive? Yes Yes Yes No Yes Yes No  Type of Advance Directive Healthcare Power of Attorney Living will Living will  Living will;Healthcare Power of [employer] Power of [place];Living will   Does patient want to make changes to medical advance directive?     No - Guardian declined No - Patient declined   Copy of Healthcare Power of Attorney in Chart? No - copy requested    No - copy requested No - copy requested   Would patient like information on creating a medical advance directive?    No - Patient declined  No - Patient declined     Current Medications (verified) Outpatient Encounter Medications as of 07/06/2023  Medication Sig   Acetylcarnitine HCl (ACETYL L-CARNITINE PO) Take 500 mg by mouth in the morning.   acidophilus (RISAQUAD) CAPS capsule Take 1 capsule by mouth in the morning.   albuterol (VENTOLIN HFA) 108 (90 Base) MCG/ACT inhaler Inhale 2 puffs into the lungs every 6 (six) hours as needed for wheezing or shortness of breath.   Alpha-Lipoic Acid 300 MG CAPS Take 300 mg by mouth 2 (two) times daily.   amLODipine (NORVASC) 10 MG tablet Take 10  mg by mouth in the morning.   Ascorbic Acid (VITAMIN C PO) Take 1 tablet by mouth in the morning.   ASTRAGALUS PO Take 470 mg by mouth every evening.   atorvastatin (LIPITOR) 80 MG tablet Take 80 mg by mouth every evening.   benzonatate (TESSALON) 200 MG capsule Take 1 capsule (200 mg total) by mouth 3 (three) times daily as needed for cough.   carvedilol (COREG) 3.125 MG tablet Take 3.125 mg by mouth 2 (two) times daily with a meal.   Cholecalciferol (VITAMIN D) 50 MCG (2000 UT) CAPS Take 2,000 Units by mouth in the morning.   clotrimazole-betamethasone (LOTRISONE) cream Apply 1 Application topically daily.   Coenzyme Q10 (COQ-10) 100 MG capsule Take 100 mg by mouth at bedtime.   CONTOUR NEXT TEST test strip 2 (two) times daily.   Fluticasone-Umeclidin-Vilant (TRELEGY ELLIPTA) 100-62.5-25 MCG/ACT AEPB Inhale 1 puff into the lungs daily.   furosemide (LASIX) 20 MG tablet Take 1 tablet (20 mg total) by mouth daily.   GRAPE SEED EXTRACT PO Take 100 mg by mouth every evening.   L-METHYLFOLATE PO Take 1,000 mcg by mouth in the morning.   levocetirizine (XYZAL) 5 MG tablet TAKE ONE TABLET BY MOUTH EVERY EVENING   meclizine (ANTIVERT) 25 MG  tablet Take 1 tablet (25 mg total) by mouth 3 (three) times daily as needed for dizziness.   methotrexate (RHEUMATREX) 2.5 MG tablet Take 20 mg by mouth every Thursday. In the morning.   Multiple Vitamin (MULTIVITAMIN WITH MINERALS) TABS tablet Take 1 tablet by mouth every evening.   NOVOLOG 100 UNIT/ML injection INJECT UP TO 100 UNITS DAILY MAXIMUM DOSE WITH INSULIN PUMP DAILY   Omega-3 Fatty Acids (OMEGA III EPA+DHA PO) Take 1 capsule by mouth in the morning.   ondansetron (ZOFRAN ODT) 8 MG disintegrating tablet 8mg  ODT q6 hours prn nausea   Polyvinyl Alcohol-Povidone (REFRESH OP) Apply 1-2 drops to eye every 3 (three) hours as needed (dryness).   telmisartan (MICARDIS) 40 MG tablet Take 40 mg by mouth in the morning.   azithromycin (ZITHROMAX) 250 MG tablet 2  tablets day 1, then 1 tablet days 2-4 (Patient not taking: Reported on 07/06/2023)   folic acid (FOLVITE) 1 MG tablet Take 1 mg by mouth in the morning. (Patient not taking: Reported on 07/06/2023)   mirabegron ER (MYRBETRIQ) 25 MG TB24 tablet Take 1 tablet (25 mg total) by mouth daily.   predniSONE (DELTASONE) 20 MG tablet 3 tablets today, 2 tablets tomorrow, 1 tablet the third day (Patient not taking: Reported on 07/06/2023)   Vitamins-Lipotropics (B-100 PO) Take 1 tablet by mouth in the morning. (Patient not taking: Reported on 07/06/2023)   Facility-Administered Encounter Medications as of 07/06/2023  Medication   water for irrigation, sterile for irrigation SOLN    Allergies (verified) Lisinopril and Latex   History: Past Medical History:  Diagnosis Date   Arthritis    Bilateral foot-drop    after back surgery   BPH (benign prostatic hyperplasia)    Cataract    Chronic lymphocytic leukemia (HCC) early 2018   no current treatment   CLL (chronic lymphocytic leukemia) (HCC)    DM type 1 (diabetes mellitus, type 1) (HCC)    type 1   High cholesterol    Hypertension    Neuromuscular disorder (HCC)    diabetic neuropathy   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   Past Surgical History:  Procedure Laterality Date   APPENDECTOMY  early 20's   BACK SURGERY     BRAIN SURGERY  10/30/04   brain shunt, [facility]   CATARACT EXTRACTION Bilateral    left eye prostheisi "shell"   CYST REMOVAL NECK  yrs ago   CYSTOSCOPY [name] 04/22/2023   Procedure: CYSTOSCOPY;  Surgeon: Malen Gauze, MD;  Location: [facility];  Service: Urology;  Laterality: [name];   CYSTOSCOPY WITH INJECTION [name] 12/17/2022   Procedure: CYSTOSCOPY WITH INJECTION-Botox;  Surgeon: Malen Gauze, MD;  Location: [facility];  Service: Urology;  Laterality: [name];   CYSTOSCOPY WITH INSERTION OF UROLIFT  april/march 2020   done in office   CYSTOSCOPY WITH INSERTION OF UROLIFT [name] 10/12/2019    Procedure: CYSTOSCOPY WITH INSERTION OF UROLIFT;  Surgeon: Malen Gauze, MD;  [facility]: [facility];  Service: Urology;  Laterality: [name];   EYE SURGERY     LUMBAR FUSION  1998 and 2000   l5 to s1 1998, then l4 and l5 in 2000   SPINE SURGERY     H. Elsner, L4-5 & L5-S1   TRANSURETHRAL RESECTION OF PROSTATE [name] 04/22/2023   Procedure: TRANSURETHRAL RESECTION OF THE PROSTATE (TURP);  Surgeon: Malen Gauze, MD;  Location: [facility];  Service: Urology;  Laterality: [name];   VENTRICULOPERITONEAL  SHUNT  2006   Family History  Problem Relation Age of Onset   Hypertension Mother    Hyperlipidemia Mother    Diabetes Mother    Diabetes Father    Social History   Socioeconomic History   Marital status: Married    Spouse name: Not on file   Number of children: Not on file   Years of education: Not on file   Highest education level: Not on file  Occupational History   Occupation: Engineer, structural    Comment: Management consultant (employer)  Tobacco Use   Smoking status: Never   Smokeless tobacco: Never  Vaping Use   Vaping status: Never Used  Substance and Sexual Activity   Alcohol use: No   Drug use: No   Sexual activity: Not Currently  Other Topics Concern   Not on file  Social History Narrative   Not on file   Social Drivers of Health   Financial Resource Strain: Low Risk  (07/06/2023)   Overall Financial Resource Strain (CARDIA)    Difficulty of Paying Living Expenses: Not hard at all  Food Insecurity: No Food Insecurity (07/06/2023)   Hunger Vital Sign    Worried About Running Out of Food in the Last Year: Never true    Ran Out of Food in the Last Year: Never true  Transportation Needs: No Transportation Needs (07/06/2023)   PRAPARE - Administrator, Civil Service (Medical): No    Lack of Transportation (Non-Medical): No  Physical Activity: Inactive (07/06/2023)   Exercise Vital Sign    Days of Exercise per Week: 0 days    Minutes of  Exercise per Session: 0 min  Stress: No Stress Concern Present (07/06/2023)   [employer] of Occupational Health - Occupational Stress Questionnaire    Feeling of Stress : Not at all  Social Connections: Socially Isolated (07/06/2023)   Social Connection and Isolation Panel [NHANES]    Frequency of Communication with Friends and Family: Once a week    Frequency of Social Gatherings with Friends and Family: Once a week    Attends Religious Services: Never    Database administrator or Organizations: No    Attends Engineer, structural: Never    Marital Status: Married    Tobacco Counseling Counseling given: Not Answered    Clinical Intake:  Pre-visit preparation completed: Yes  Pain : 0-10 Pain Score: 4  Pain Type: Chronic pain Pain Location: Hand Pain Orientation: Left, Right Pain Descriptors / Indicators: Aching Pain Onset: More than a month ago Pain Frequency: Constant     Nutritional Status: BMI 25 -29 Overweight Nutritional Risks: None Diabetes: Yes CBG done?: No Did pt. bring in CBG monitor from home?: No  How often do you need to have someone help you when you read instructions, pamphlets, or other written materials from your doctor or pharmacy?: 1 - Never  Interpreter Needed?: No  Information entered by :: NAllen LPN   Activities of Daily Living     07/06/2023    3:18 PM 04/19/2023   10:58 AM  In your present state of health, do you have any difficulty performing the following activities:  Hearing? 0 0  Vision? 1 0  Comment left eye prosthetic   Difficulty concentrating or making decisions? 0 0  Walking or climbing stairs? 1   Dressing or bathing? 0   Doing errands, shopping? 0   Preparing Food and eating ? N   Using the Toilet? N  In the past six months, have you accidently leaked urine? Y   Do you have problems with loss of bowel control? N   Managing your Medications? N   Managing your Finances? N   Housekeeping or managing your  Housekeeping? N     Patient Care Team: Early, Sung Amabile, NP as PCP - General (Nurse Practitioner) Barnett Abu, MD as Consulting Physician (Neurosurgery) [facility], [facility]  Indicate any recent Medical Services you may have received from other than [facility] providers in the past year (date may be approximate).     Assessment:   This is a routine wellness examination for Trei.  Hearing/Vision screen Hearing Screening - Comments:: Denies hearing issues Vision Screening - Comments:: Regular eye exams, Dr. Charlotte Sanes   Goals Addressed             This Visit's Progress    Patient Stated       07/06/2023       Depression Screen     07/06/2023    3:51 PM 10/19/2022    2:05 PM  PHQ 2/9 Scores  PHQ - 2 Score 0 0    Fall Risk     07/06/2023    3:47 PM 10/19/2022    2:05 PM  Fall Risk   Falls in the past year? 1 1  Comment due to drop foot   Number falls in past yr: 1 1  Injury with Fall? 0 0  Risk for fall due to : Impaired balance/gait;Medication side effect;Impaired vision History of fall(s)  Follow up Falls prevention discussed;Falls evaluation completed Falls prevention discussed    MEDICARE RISK AT HOME:  Medicare Risk at Home Any stairs in or around the home?: Yes If so, are there any without handrails?: No Home free of loose throw rugs in walkways, pet beds, electrical cords, etc?: Yes Adequate lighting in your home to reduce risk of falls?: Yes Life alert?: No Use of a cane, walker or w/c?: No Grab bars in the bathroom?: No Shower chair or bench in shower?: No Elevated toilet seat or a handicapped toilet?: Yes  TIMED UP AND GO:  Was the test performed?  Yes  Length of time to ambulate 10 feet: 5 sec Gait steady and fast without use of assistive device  Cognitive Function: 6CIT completed        07/06/2023    3:51 PM  6CIT Screen  What Year? 0 points  What month? 0 points  What time? 0 points  Count back from 20 0 points  Months in reverse  0 points  Repeat phrase 0 points  Total Score 0 points    Immunizations Immunization History  Administered Date(s) Administered   Influenza, High Dose Seasonal PF 01/29/2023   Moderna Covid-19 Fall Seasonal Vaccine 92yrs & older 03/20/2022, 08/07/2022, 01/29/2023   PNEUMOCOCCAL CONJUGATE-20 08/04/2022   Rsv, Bivalent, Protein Subunit Rsvpref,pf Verdis Frederickson) 08/21/2022   Tdap 09/08/2021   Zoster Recombinant(Shingrix) 05/29/2021, 09/08/2021    Screening Tests Health Maintenance  Topic Date Due   FOOT EXAM  Never done   Diabetic kidney evaluation - Urine ACR  Never done   Colonoscopy  Never done   HEMOGLOBIN A1C  06/13/2023   OPHTHALMOLOGY EXAM  07/14/2023   COVID-19 Vaccine (4 - Moderna risk 2024-25 season) 07/30/2023   Diabetic kidney evaluation - eGFR measurement  06/24/2024   Medicare Annual Wellness (AWV)  07/05/2024   DTaP/Tdap/Td (2 - Td or Tdap) 09/09/2031   Pneumonia Vaccine 40+ Years old  Completed  INFLUENZA VACCINE  Completed   Hepatitis C Screening  Completed   Zoster Vaccines- Shingrix  Completed   HPV VACCINES  Aged Out    Health Maintenance  Health Maintenance Due  Topic Date Due   FOOT EXAM  Never done   Diabetic kidney evaluation - Urine ACR  Never done   Colonoscopy  Never done   HEMOGLOBIN A1C  06/13/2023   Health Maintenance Items Addressed: Cologuard completed 10/12/2022. Seen podiatrist 05/14/2023  Additional Screening:  Vision Screening: Recommended annual ophthalmology exams for early detection of glaucoma and other disorders of the eye.  Dental Screening: Recommended annual dental exams for proper oral hygiene  Community Resource Referral / Chronic Care Management: CRR required this visit?  No   CCM required this visit?  No     Plan:     I have personally reviewed and noted the following in the patient's chart:   Medical and social history Use of alcohol, tobacco or illicit drugs  Current medications and supplements including  opioid prescriptions. Patient is not currently taking opioid prescriptions. Functional ability and status Nutritional status Physical activity Advanced directives List of other physicians Hospitalizations, surgeries, and ER visits in previous 12 months Vitals Screenings to include cognitive, depression, and falls Referrals and appointments  In addition, I have reviewed and discussed with patient certain preventive protocols, quality metrics, and best practice recommendations. A written personalized care plan for preventive services as well as general preventive health recommendations were provided to patient.     Barb Merino, LPN   07/27/8117   After Visit Summary: (MyChart) Due to this being a telephonic visit, the after visit summary with patients personalized plan was offered to patient via MyChart   Notes: Nothing significant to report at this time.

## 2023-07-15 ENCOUNTER — Ambulatory Visit: Admitting: Nurse Practitioner

## 2023-07-15 ENCOUNTER — Encounter: Payer: Self-pay | Admitting: Nurse Practitioner

## 2023-07-15 VITALS — BP 138/72 | HR 59 | Wt 169.4 lb

## 2023-07-15 DIAGNOSIS — R634 Abnormal weight loss: Secondary | ICD-10-CM

## 2023-07-15 DIAGNOSIS — R600 Localized edema: Secondary | ICD-10-CM

## 2023-07-15 DIAGNOSIS — I10 Essential (primary) hypertension: Secondary | ICD-10-CM | POA: Diagnosis not present

## 2023-07-15 DIAGNOSIS — D72820 Lymphocytosis (symptomatic): Secondary | ICD-10-CM

## 2023-07-15 DIAGNOSIS — Z7189 Other specified counseling: Secondary | ICD-10-CM

## 2023-07-15 DIAGNOSIS — E1069 Type 1 diabetes mellitus with other specified complication: Secondary | ICD-10-CM | POA: Diagnosis not present

## 2023-07-15 DIAGNOSIS — D649 Anemia, unspecified: Secondary | ICD-10-CM | POA: Diagnosis not present

## 2023-07-15 DIAGNOSIS — E871 Hypo-osmolality and hyponatremia: Secondary | ICD-10-CM

## 2023-07-15 DIAGNOSIS — E875 Hyperkalemia: Secondary | ICD-10-CM

## 2023-07-15 DIAGNOSIS — M1990 Unspecified osteoarthritis, unspecified site: Secondary | ICD-10-CM

## 2023-07-15 DIAGNOSIS — C9110 Chronic lymphocytic leukemia of B-cell type not having achieved remission: Secondary | ICD-10-CM

## 2023-07-15 DIAGNOSIS — R350 Frequency of micturition: Secondary | ICD-10-CM

## 2023-07-15 DIAGNOSIS — G912 (Idiopathic) normal pressure hydrocephalus: Secondary | ICD-10-CM

## 2023-07-15 DIAGNOSIS — R011 Cardiac murmur, unspecified: Secondary | ICD-10-CM

## 2023-07-15 DIAGNOSIS — E782 Mixed hyperlipidemia: Secondary | ICD-10-CM

## 2023-07-15 HISTORY — DX: Cardiac murmur, unspecified: R01.1

## 2023-07-15 HISTORY — DX: Anemia, unspecified: D64.9

## 2023-07-15 LAB — CBC WITH DIFFERENTIAL/PLATELET

## 2023-07-15 MED ORDER — ATORVASTATIN CALCIUM 80 MG PO TABS
80.0000 mg | ORAL_TABLET | Freq: Every evening | ORAL | 3 refills | Status: DC
Start: 2023-07-15 — End: 2023-08-02

## 2023-07-15 NOTE — Patient Instructions (Addendum)
 I am going to check for some specific concerns of auto-immune causes of the concerns you have. I will also check your thyroid, B12, and iron levels to make sure we have a good understanding of what the lab changes may indicate.   I do want you to follow-up with the cardiologist in May. It is ok to wait for this appointment.   Heart Murmur A heart murmur is an extra sound that is caused by turbulent blood flow through the valves of the heart. The murmur can be heard as a "hum" or "whoosh" sound when blood flows through the heart. The heart has four areas called chambers. There is a valve for each chamber for the heart. Blood passes through a valve before leaving the chamber. Two of the valves move the blood from the upper chambers of the heart to the lower chambers of the heart (tricuspid valve and mitral valve). The other two valves (aortic valve and pulmonary valve) move the blood to the lungs and the rest of the body. The valves keep blood moving through the heart in the right direction. When the heart valves are not working properly it may cause a murmur. There are two types of heart murmurs: Innocent (benign) murmurs. Most people with this type of heart murmur do not have a heart problem. Many children have innocent heart murmurs. Your health care provider may suggest some basic tests to find out whether your murmur is an innocent murmur. If an innocent heart murmur is found, there is no need for further tests or treatment and no need to restrict activities or stop playing sports. Abnormal murmurs. These types of murmurs can occur in children and adults. Abnormal murmurs may be a sign of a more serious heart condition, such as a heart abnormality present at birth (congenital defect) or heart valve disease. What are the causes?  This condition may also be caused by: Pregnancy. Fever. Overactive thyroid gland. Anemia. Exercise. Rapid growth spurts in children. What are the signs or  symptoms? Innocent murmurs do not cause symptoms, and many people with abnormal murmurs may not have symptoms. If symptoms do develop, they may include: Shortness of breath or persistent cough. Blue coloring of the skin, especially on the fingertips. Chest pain. Palpitations, or feeling a fluttering or skipped heartbeat. Fainting. Getting tired much faster than expected. Swelling in the abdomen, feet, or ankles. How is this diagnosed? This condition may be diagnosed during a routine physical or other exam. If your health care provider hears a murmur with a stethoscope, he or she will listen for: Where the murmur is located in your heart. How loud the murmur is. This may help the health care provider figure out what is causing the murmur. You may be referred to a heart specialist (cardiologist). You may also have other tests, including: Electrocardiogram (ECG or EKG). This test measures the electrical activity of your heart. Echocardiogram. This test uses high frequency sound waves to make pictures of your heart. Chest X-ray. Magnetic resonance imaging (MRI). Cardiac catheterization. This test looks at blood flow through the arteries around the heart. For children and adults who have an abnormal heart murmur and want to stay active, it is important to: Complete testing. Review test results. Receive recommendations from your health care provider. If heart disease is present, it may not be safe to play or be involved in activities that require a lot of effort and energy (are strenuous). How is this treated? Heart murmurs themselves do not need treatment.  In some cases, a heart murmur may go away on its own. If an underlying problem or disease is causing the murmur, you may need treatment. If treatment is needed, it will depend on the type and severity of the disease or heart problem causing the murmur. Treatment may include: Medicine. Surgery. Changes to your lifestyle and diet. Follow  these instructions at home: Talk with your health care provider before participating in sports or other activities that are strenuous. Learn as much as possible about your condition and any related diseases. Ask your health care provider if you may be at risk for any medical emergencies. Talk with your health care provider about what symptoms you should look out for. It is up to you to get your test results. Ask your health care provider, or the department that is doing the test, when your results will be ready. Keep all follow-up visits. This is important. Contact a health care provider if: You are frequently short of breath. You feel more tired than usual. You are having a hard time keeping up with normal activities or fitness routines. You have swelling in your ankles or feet. You notice that your heart often beats irregularly. You develop any new symptoms. Get help right away if: You have chest pain. You are having trouble breathing. You feel light-headed or you faint. Your symptoms suddenly get worse. These symptoms may represent a serious problem that is an emergency. Do not wait to see if the symptoms will go away. Get medical help right away. Call your local emergency services (911 in the [place] Do not drive yourself to the hospital. Summary Heart valves keep blood moving through the heart in the right direction. When the valves are not working properly it may cause a murmur. Innocent murmurs do not cause symptoms, and many people with abnormal murmurs may not have symptoms. You may need treatment if an underlying problem or disease is causing the heart murmur. Treatment may include medicine, surgery, and changes to your lifestyle and diet. Talk with your health care provider before participating in sports or other activities that are strenuous. Get help right away if you have chest pain or trouble breathing. This information is not intended to replace advice given to you by your  health care provider. Make sure you discuss any questions you have with your health care provider. Document Revised: 07/15/2020 Document Reviewed: 07/15/2020 Elsevier Patient Education  2024 [employer].

## 2023-07-15 NOTE — Progress Notes (Addendum)
 Christopher Kief, DNP, AGNP-c [facility] [address] [place], [place] [address] [phone]   ACUTE VISIT- ESTABLISHED PATIENT  Blood pressure 138/72, pulse (!) 59, weight 169 lb 6.4 oz (76.8 kg).  Subjective:  HPI [name] is a 73 y.o. male presents to day for evaluation of acute concern(s).   History of Present Illness Christopher Conner "Christopher Conner" is a 72 year old male who presents with several concerns.   He has had persistent hyponatremia since at least 2010, with sodium levels consistently at the low end or below the normal range. Various medications have been tried without success (unclear what medications have been tried). Potassium levels are either normal or high. He has had multiple tests completed with endocrinology to rule out cushings, adrenal dysfunction, SIADH, and other concerns with all normal findings. He is concerned as to what is causing this and how to correct the issue.   He describes significant weight loss and feels he is not retaining fluids properly, losing about five pounds of fluid overnight. He reports frequent urination with "large volumes" of urine. He is concerned about not absorbing nutrients effectively despite a healthy diet. He has a history of urinary issues, including nocturia, which he attributes to a lack of control and possibly related to previous surgeries; however, he is concerned there is a correlation with the other symptoms.   He has chronic lymphocytic leukemia, which is stable, but it complicates the interpretation of his blood work, particularly in distinguishing between infection and CLL-related changes. He questions how to determine if there is infection or inflammation present when consistently elevated WBC readings.   He reports anemia, which he attributes to not consuming red meat and questions whether it is related to his CLL or absorption. He has recently started taking multivitamins with iron but has not noticed an  improvement in his anemia.  He has a history of type 1 diabetes, which is well-controlled with the help of an endocrinologist. He has concerns about the connection between his diabetes and his sodium levels and other blood work alterations.   He experiences peripheral edema, which he initially attributed to his medications, particularly amlodipine. He wears hiking socks for support and notes significant swelling when he does not wear them. He recently performed an "experiment" to see how much swelling occurred when he did not wear his stockings, and the swelling is reported as "big time fluid". He is concerned that the amount of swelling is too much for it to be only caused by the amlodipine.   He is on multiple medications, including methotrexate for arthritis, levocetirizine for allergies, telmisartan, amlodipine, and carvedilol for blood pressure, and atorvastatin for cholesterol.     ROS negative except for what is listed in HPI. History, Medications, Surgery, SDOH, and Family History reviewed and updated as appropriate.  Objective:  Physical Exam Vitals and nursing note reviewed.  Constitutional:      General: He is not in acute distress.    Appearance: Normal appearance. He is not ill-appearing.  HENT:     Head: Normocephalic.  Eyes:     Conjunctiva/sclera: Conjunctivae normal.  Neck:     Vascular: No carotid bruit.  Cardiovascular:     Rate and Rhythm: Normal rate and regular rhythm.     Pulses: Normal pulses.     Heart sounds: Murmur heard.  Pulmonary:     Effort: Pulmonary effort is normal.     Breath sounds: Normal breath sounds. No wheezing or rhonchi.  Musculoskeletal:     Cervical back: Neck supple.     Comments: Compression stockings in place. Mild swelling noted bilaterally in LE's.   Lymphadenopathy:     Cervical: No cervical adenopathy.  Skin:    General: Skin is warm and dry.     Capillary Refill: Capillary refill takes less than 2 seconds.  Neurological:      Mental Status: He is alert and oriented to person, place, and time.     Motor: Weakness present.     Gait: Gait abnormal.  Psychiatric:        Mood and Affect: Mood normal.        Behavior: Behavior normal.         Assessment & Plan:   Problem List Items Addressed This Visit     Hyponatremia   Chronic hyponatremia with previous unsuccessful treatment attempts and testing for etiology. Adrenal issues and SIADH have been ruled out by endocrinology. We reviewed these lab results today. Sodium levels are low but stable, with no immediate severe risk. Urine osmolality has consistently been low.  I understand his frustration and concerns; however, review of the chart shows that typical monitoring that would typically be completed in the primary care/non-specialty setting has been completed and found to be normal. While we are able to rule out certain concerns, I am very limited in training and knowledge of additional specialty testing that may give us  answers. I discussed that we could consider renal tubular defect, but this is outside of my area for evaluation and testing. We discussed the possibility of autoimmune causes given that he does have other autoimmune conditions it is more likely that there may be others. I will test today for possible autoimmune causes, but again, I am limited in how far my skill set can take me with this. He may benefit from evaluation at one of the teaching hospitals if we are unable to find clear answers given their specialty's in conditions that do not fit the "normal" findings.  - Check for autoimmune conditions affecting sodium levels. - Continue sodium replacement and high salt diet. - Consider evaluation at [facility] or [facility] if unable to find clear answers.       Relevant Orders   CBC with Differential/Platelet (Completed)   Comprehensive metabolic panel with GFR (Completed)   TSH (Completed)   T4, free (Completed)   ANA, IFA (with reflex) (Completed)    Intrinsic Factor Antibodies (Completed)   Anti-CCP Ab, IgG + IgA (RDL) (Completed)   Vitamin B12 (Completed)   Iron, TIBC and Ferritin Panel (Completed)   Diabetes (HCC)   Managed closely with endocrinology at [facility]. No alarm symptoms at this time. Labs reviewed.       Relevant Medications   atorvastatin (LIPITOR) 80 MG tablet   Hypertension   Blood pressure is generally well-controlled with telmisartan, amlodipine, and carvedilol, but occasional fluctuations occur. We discussed that some fluctuations are normal and expected, but some of this could be related to fluid and electrolyte changes. At this time, I do not feel that medication changes are warranted as he does not typically have higher than 140 systolic readings, but can have readings in the low 110's. We discussed that dropping his blood pressure too much could be problematic.  Cardiologist to evaluate medication regimen. - Continue current antihypertensive regimen. - Discuss blood pressure management with cardiology during the upcoming appointment.      Relevant Medications   atorvastatin (LIPITOR) 80 MG tablet  Lymphocytosis   Chronic. R/T CLL. Oncology following. No alarming changes.       Relevant Orders   CBC with Differential/Platelet (Completed)   Comprehensive metabolic panel with GFR (Completed)   TSH (Completed)   T4, free (Completed)   ANA, IFA (with reflex) (Completed)   Intrinsic Factor Antibodies (Completed)   Anti-CCP Ab, IgG + IgA (RDL) (Completed)   Vitamin B12 (Completed)   Iron, TIBC and Ferritin Panel (Completed)   CLL (chronic lymphocytic leukemia) (HCC) - Primary   Chronic management with oncology. White counts elevated consistent with his normal readings with no significant changes noted. No apparent changes that warrant concern at this time. Recommend close monitoring. Consider evaluation and discussion with oncology to ensure that none of the concerns are related to this condition.        Relevant Orders   CBC with Differential/Platelet (Completed)   Comprehensive metabolic panel with GFR (Completed)   TSH (Completed)   T4, free (Completed)   ANA, IFA (with reflex) (Completed)   Intrinsic Factor Antibodies (Completed)   Anti-CCP Ab, IgG + IgA (RDL) (Completed)   Vitamin B12 (Completed)   Iron, TIBC and Ferritin Panel (Completed)   Weight loss, abnormal   Unexplained weight loss despite adequate dietary intake. No clear correlation with current lab findings. Possible thyroid dysfunction or malabsorption issues considered. Weight loss may be related to fluid loss due to over-diuresis. - Check thyroid function tests to rule out thyroid dysfunction.      Relevant Orders   CBC with Differential/Platelet (Completed)   Comprehensive metabolic panel with GFR (Completed)   TSH (Completed)   T4, free (Completed)   ANA, IFA (with reflex) (Completed)   Intrinsic Factor Antibodies (Completed)   Anti-CCP Ab, IgG + IgA (RDL) (Completed)   Vitamin B12 (Completed)   Iron, TIBC and Ferritin Panel (Completed)   Hypo-osmolality and hyponatremia   Given his chronically low sodium and hypo-osmolality, I do question whether there could be a cardiac etiology for this finding. Murmur is detected today and he does have LE edema. He has a cardiology appt in May, I provided reassurance today with his concerns for waiting given that his symptoms have been chronic in nature (at least since 2010) and do not appear to be worsening. Kidney evaluation may also be helpful for closer evaluation of renal tubular concerns.       Hyperkalemia   Intermittent hyperkalemia with no clear etiology. Potassium levels are not consistently elevated, and dietary intake is not excessive. - Monitor potassium levels in conjunction with sodium management.      Relevant Orders   CBC with Differential/Platelet (Completed)   Comprehensive metabolic panel with GFR (Completed)   TSH (Completed)   T4, free (Completed)    ANA, IFA (with reflex) (Completed)   Intrinsic Factor Antibodies (Completed)   Anti-CCP Ab, IgG + IgA (RDL) (Completed)   Vitamin B12 (Completed)   Iron, TIBC and Ferritin Panel (Completed)   Anemia   Chronic in nature with concerns of limited improvement despite iron supplementation. At this time, it is unclear what is causing this. Suspect possible correlation with CLL, but will need to review chart and oncology notes to determine if this is considered. Labs pending today.       Relevant Orders   CBC with Differential/Platelet (Completed)   Comprehensive metabolic panel with GFR (Completed)   TSH (Completed)   T4, free (Completed)   ANA, IFA (with reflex) (Completed)   Intrinsic Factor Antibodies (Completed)   Anti-CCP  Ab, IgG + IgA (RDL) (Completed)   Vitamin B12 (Completed)   Iron, TIBC and Ferritin Panel (Completed)   Murmur, cardiac   Detectable murmur in the setting of edema, hyponatremia, hypoosmolality of urine, and various other symptoms. Unclear if there is correlation with symptoms/signs or if these are independent. Consider possible cardiac cause for some of the changes noted. He is seeing cardiology in May. These findings are chronic in nature with no evidence of new or worsening findings, which is reassuring for lack of immediate urgency. We discussed that while we do want to find answers, he appears stable and waiting until May for the appointment would be acceptable if he is unable to be seen sooner. He is on the cancellation list, which may shorten the wait period. Will monitor. Consider ordering echocardiogram in the interim.       Peripheral edema   Chronic peripheral edema possibly related to cardiac function or medication side effects. Amlodipine may contribute to fluid retention, but I do agree that the level of swelling described without compression stockings seems excessive for the medication alone.  Heart murmur noted, and cardiology evaluation is pending. -  Await cardiology evaluation and echocardiogram to assess cardiac function.       Arthritis   Chronic arthritis with hand joint involvement. Methotrexate is currently used, but its effectiveness is uncertain. He experiences joint fusion and soreness. - Continue methotrexate for arthritis management.      Goals of care, counseling/discussion   Discussion about the complexity of his medical conditions and the need for a comprehensive management approach. No immediate life-threatening issues identified, but ongoing monitoring and coordination with specialists are necessary to determine if a cause can be found for his complex and multiple symptoms.  - Coordinate care with specialists for evaluation and management of complex medical concerns, symptoms, and history.  - Consider teaching hospital specialists if no clear etiology can be identified.       Normal pressure hydrocephalus (HCC)   Relevant Orders   CBC with Differential/Platelet (Completed)   Comprehensive metabolic panel with GFR (Completed)   TSH (Completed)   T4, free (Completed)   ANA, IFA (with reflex) (Completed)   Intrinsic Factor Antibodies (Completed)   Anti-CCP Ab, IgG + IgA (RDL) (Completed)   Vitamin B12 (Completed)   Iron, TIBC and Ferritin Panel (Completed)   Other Visit Diagnoses       Urinary frequency         Mixed hyperlipidemia       Relevant Medications   atorvastatin (LIPITOR) 80 MG tablet         Christopher Kief, DNP, AGNP-c Time: 112 minutes, >50% spent counseling, care coordination, chart review, and documentation. Majority of time spent face to face with patient.

## 2023-07-18 ENCOUNTER — Encounter: Payer: Self-pay | Admitting: Nurse Practitioner

## 2023-07-18 DIAGNOSIS — R600 Localized edema: Secondary | ICD-10-CM | POA: Insufficient documentation

## 2023-07-18 DIAGNOSIS — Z7189 Other specified counseling: Secondary | ICD-10-CM | POA: Insufficient documentation

## 2023-07-18 DIAGNOSIS — M1990 Unspecified osteoarthritis, unspecified site: Secondary | ICD-10-CM | POA: Insufficient documentation

## 2023-07-18 HISTORY — DX: Other specified counseling: Z71.89

## 2023-07-18 NOTE — Assessment & Plan Note (Signed)
 Chronic peripheral edema possibly related to cardiac function or medication side effects. Amlodipine may contribute to fluid retention, but I do agree that the level of swelling described without compression stockings seems excessive for the medication alone.  Heart murmur noted, and cardiology evaluation is pending. - Await cardiology evaluation and echocardiogram to assess cardiac function.

## 2023-07-18 NOTE — Assessment & Plan Note (Signed)
 Blood pressure is generally well-controlled with telmisartan, amlodipine, and carvedilol, but occasional fluctuations occur. We discussed that some fluctuations are normal and expected, but some of this could be related to fluid and electrolyte changes. At this time, I do not feel that medication changes are warranted as he does not typically have higher than 140 systolic readings, but can have readings in the low 110's. We discussed that dropping his blood pressure too much could be problematic.  Cardiologist to evaluate medication regimen. - Continue current antihypertensive regimen. - Discuss blood pressure management with cardiology during the upcoming appointment.

## 2023-07-18 NOTE — Assessment & Plan Note (Signed)
 Discussion about the complexity of his medical conditions and the need for a comprehensive management approach. No immediate life-threatening issues identified, but ongoing monitoring and coordination with specialists are necessary to determine if a cause can be found for his complex and multiple symptoms.  - Coordinate care with specialists for evaluation and management of complex medical concerns, symptoms, and history.  - Consider teaching hospital specialists if no clear etiology can be identified.

## 2023-07-18 NOTE — Assessment & Plan Note (Signed)
 Chronic in nature with concerns of limited improvement despite iron supplementation. At this time, it is unclear what is causing this. Suspect possible correlation with CLL, but will need to review chart and oncology notes to determine if this is considered. Labs pending today.

## 2023-07-18 NOTE — Assessment & Plan Note (Signed)
 Chronic. R/T CLL. Oncology following. No alarming changes.

## 2023-07-18 NOTE — Assessment & Plan Note (Signed)
 Intermittent hyperkalemia with no clear etiology. Potassium levels are not consistently elevated, and dietary intake is not excessive. - Monitor potassium levels in conjunction with sodium management.

## 2023-07-18 NOTE — Assessment & Plan Note (Signed)
 Unexplained weight loss despite adequate dietary intake. No clear correlation with current lab findings. Possible thyroid dysfunction or malabsorption issues considered. Weight loss may be related to fluid loss due to over-diuresis. - Check thyroid function tests to rule out thyroid dysfunction.

## 2023-07-18 NOTE — Assessment & Plan Note (Signed)
 Managed closely with endocrinology at [facility] No alarm symptoms at this time. Labs reviewed.

## 2023-07-18 NOTE — Assessment & Plan Note (Signed)
 Chronic hyponatremia with previous unsuccessful treatment attempts and testing for etiology. Adrenal issues and SIADH have been ruled out by endocrinology. We reviewed these lab results today. Sodium levels are low but stable, with no immediate severe risk. Urine osmolality has consistently been low.  I understand his frustration and concerns; however, review of the chart shows that typical monitoring that would typically be completed in the primary care/non-specialty setting has been completed and found to be normal. While we are able to rule out certain concerns, I am very limited in training and knowledge of additional specialty testing that may give [place] answers. I discussed that we could consider renal tubular defect, but this is outside of my area for evaluation and testing. We discussed the possibility of autoimmune causes given that he does have other autoimmune conditions it is more likely that there may be others. I will test today for possible autoimmune causes, but again, I am limited in how far my skill set can take me with this. He may benefit from evaluation at one of the teaching hospitals if we are unable to find clear answers given their specialty's in conditions that do not fit the "normal" findings.  - Check for autoimmune conditions affecting sodium levels. - Continue sodium replacement and high salt diet. - Consider evaluation at [facility] or [facility] if unable to find clear answers.

## 2023-07-18 NOTE — Assessment & Plan Note (Signed)
 Detectable murmur in the setting of edema, hyponatremia, hypoosmolality of urine, and various other symptoms. Unclear if there is correlation with symptoms/signs or if these are independent. Consider possible cardiac cause for some of the changes noted. He is seeing cardiology in May. These findings are chronic in nature with no evidence of new or worsening findings, which is reassuring for lack of immediate urgency. We discussed that while we do want to find answers, he appears stable and waiting until May for the appointment would be acceptable if he is unable to be seen sooner. He is on the cancellation list, which may shorten the wait period. Will monitor. Consider ordering echocardiogram in the interim.

## 2023-07-18 NOTE — Assessment & Plan Note (Signed)
 Chronic management with oncology. White counts elevated consistent with his normal readings with no significant changes noted. No apparent changes that warrant concern at this time. Recommend close monitoring. Consider evaluation and discussion with oncology to ensure that none of the concerns are related to this condition.

## 2023-07-18 NOTE — Assessment & Plan Note (Signed)
 Chronic arthritis with hand joint involvement. Methotrexate is currently used, but its effectiveness is uncertain. He experiences joint fusion and soreness. - Continue methotrexate for arthritis management.

## 2023-07-18 NOTE — Assessment & Plan Note (Signed)
 Given his chronically low sodium and hypo-osmolality, I do question whether there could be a cardiac etiology for this finding. Murmur is detected today and he does have LE edema. He has a cardiology appt in May, I provided reassurance today with his concerns for waiting given that his symptoms have been chronic in nature (at least since 2010) and do not appear to be worsening. Kidney evaluation may also be helpful for closer evaluation of renal tubular concerns.

## 2023-07-21 LAB — CBC WITH DIFFERENTIAL/PLATELET
Basophils Absolute: 0.1 10*3/uL (ref 0.0–0.2)
Basos: 0 %
EOS (ABSOLUTE): 0.2 10*3/uL (ref 0.0–0.4)
Eos: 1 %
Hematocrit: 39.7 % (ref 37.5–51.0)
Hemoglobin: 12.9 g/dL — ABNORMAL LOW (ref 13.0–17.7)
Immature Grans (Abs): 0 10*3/uL (ref 0.0–0.1)
Immature Granulocytes: 0 %
Lymphocytes Absolute: 12.6 10*3/uL — ABNORMAL HIGH (ref 0.7–3.1)
Lymphs: 77 %
MCH: 32.5 pg (ref 26.6–33.0)
MCHC: 32.5 g/dL (ref 31.5–35.7)
MCV: 100 fL — ABNORMAL HIGH (ref 79–97)
Monocytes Absolute: 0.6 10*3/uL (ref 0.1–0.9)
Monocytes: 3 %
Neutrophils Absolute: 3.1 10*3/uL (ref 1.4–7.0)
Neutrophils: 19 %
Platelets: 324 10*3/uL (ref 150–450)
RBC: 3.97 x10E6/uL — ABNORMAL LOW (ref 4.14–5.80)
RDW: 12 % (ref 11.6–15.4)
WBC: 16.5 10*3/uL — ABNORMAL HIGH (ref 3.4–10.8)

## 2023-07-21 LAB — IRON,TIBC AND FERRITIN PANEL
Ferritin: 42 ng/mL (ref 30–400)
Iron Saturation: 31 % (ref 15–55)
Iron: 99 ug/dL (ref 38–169)
Total Iron Binding Capacity: 322 ug/dL (ref 250–450)
UIBC: 223 ug/dL (ref 111–343)

## 2023-07-21 LAB — INTRINSIC FACTOR ANTIBODIES: Intrinsic Factor Abs, Serum: 1 [AU]/ml (ref 0.0–1.1)

## 2023-07-21 LAB — TSH: TSH: 1.89 u[IU]/mL (ref 0.450–4.500)

## 2023-07-21 LAB — COMPREHENSIVE METABOLIC PANEL WITH GFR
ALT: 29 IU/L (ref 0–44)
AST: 37 IU/L (ref 0–40)
Albumin: 4.2 g/dL (ref 3.8–4.8)
Alkaline Phosphatase: 78 IU/L (ref 44–121)
BUN/Creatinine Ratio: 26 — ABNORMAL HIGH (ref 10–24)
BUN: 18 mg/dL (ref 8–27)
Bilirubin Total: 0.3 mg/dL (ref 0.0–1.2)
CO2: 24 mmol/L (ref 20–29)
Calcium: 9.4 mg/dL (ref 8.6–10.2)
Chloride: 97 mmol/L (ref 96–106)
Creatinine, Ser: 0.68 mg/dL — ABNORMAL LOW (ref 0.76–1.27)
Globulin, Total: 2.2 g/dL (ref 1.5–4.5)
Glucose: 68 mg/dL — ABNORMAL LOW (ref 70–99)
Potassium: 5 mmol/L (ref 3.5–5.2)
Sodium: 135 mmol/L (ref 134–144)
Total Protein: 6.4 g/dL (ref 6.0–8.5)
eGFR: 99 mL/min/{1.73_m2} (ref >59)

## 2023-07-21 LAB — T4, FREE: Free T4: 1.49 ng/dL (ref 0.82–1.77)

## 2023-07-21 LAB — ANTI-CCP AB, IGG + IGA (RDL)

## 2023-07-21 LAB — VITAMIN B12: Vitamin B-12: 1490 pg/mL — ABNORMAL HIGH (ref 232–1245)

## 2023-07-21 LAB — ANTINUCLEAR ANTIBODIES, IFA: ANA Titer 1: NEGATIVE

## 2023-07-24 ENCOUNTER — Other Ambulatory Visit: Payer: Self-pay | Admitting: Medical

## 2023-07-28 ENCOUNTER — Encounter: Payer: Self-pay | Admitting: Nurse Practitioner

## 2023-08-02 ENCOUNTER — Other Ambulatory Visit: Payer: Self-pay

## 2023-08-02 ENCOUNTER — Other Ambulatory Visit: Payer: Self-pay | Admitting: Nurse Practitioner

## 2023-08-02 ENCOUNTER — Telehealth: Payer: Self-pay | Admitting: *Deleted

## 2023-08-02 DIAGNOSIS — E782 Mixed hyperlipidemia: Secondary | ICD-10-CM

## 2023-08-02 MED ORDER — ATORVASTATIN CALCIUM 80 MG PO TABS: 80.0000 mg | ORAL_TABLET | Freq: Every evening | ORAL | 1 refills | Status: DC

## 2023-08-02 MED ORDER — TELMISARTAN 40 MG PO TABS: 40.0000 mg | ORAL_TABLET | Freq: Every morning | ORAL | 1 refills | Status: DC

## 2023-08-02 MED ORDER — CARVEDILOL 3.125 MG PO TABS: 3.1250 mg | ORAL_TABLET | Freq: Two times a day (BID) | ORAL | 1 refills | Status: DC

## 2023-08-02 MED ORDER — TELMISARTAN 40 MG PO TABS: 40.0000 mg | ORAL_TABLET | Freq: Every morning | ORAL | 0 refills | Status: DC

## 2023-08-02 MED ORDER — ATORVASTATIN CALCIUM 80 MG PO TABS: 80.0000 mg | ORAL_TABLET | Freq: Every evening | ORAL | 0 refills | Status: AC

## 2023-08-02 MED ORDER — CARVEDILOL 3.125 MG PO TABS: 3.1250 mg | ORAL_TABLET | Freq: Two times a day (BID) | ORAL | 0 refills | Status: DC

## 2023-08-02 MED ORDER — AMLODIPINE BESYLATE 10 MG PO TABS: 10.0000 mg | ORAL_TABLET | Freq: Every morning | ORAL | 0 refills | Status: DC

## 2023-08-02 MED ORDER — AMLODIPINE BESYLATE 10 MG PO TABS: 10.0000 mg | ORAL_TABLET | Freq: Every morning | ORAL | 1 refills | Status: DC

## 2023-08-02 NOTE — Telephone Encounter (Signed)
 Copied from CRM [id number]. Topic: Clinical - Medication Question >> Aug 02, 2023  1:27 PM Marissa P wrote: Reason for CRM: Patient called to get in to get in contact with DR. Early in regards to his prescription please. He is wanting someone to call back/ follow up with him please. Callback number verified is the preferred contact on profile.

## 2023-08-02 NOTE — Telephone Encounter (Signed)
 Copied from CRM [id number]. Topic: Clinical - Medication Refill >> Aug 02, 2023  1:58 PM Zipporah Him wrote: Most Recent Primary Care Visit:  Provider: EARLY, SARA E  Department: [facility]  Visit Type: ACUTE  Date: 07/15/2023  Medication: telmisartan (MICARDIS) 40 MG tablet,  amLODipine (NORVASC) 10 MG tablet, carvedilol (COREG) 3.125 MG tablet, atorvastatin (LIPITOR) 80 MG tablet  Has the patient contacted their pharmacy? No Going to different pharmacy   Is this the correct pharmacy for this prescription? No If no, delete pharmacy and type the correct one.  This is the patient's preferred pharmacy:  [facility], [place]   [address], [place], [place] [address]   Has the prescription been filled recently? No  Is the patient out of the medication? Yes, TRAVELLED TO SEE FAMILY, ONLY BROUGHT A WEEK WITH HIM, STAYING LONGER THAN EXPECTED.  Has the patient been seen for an appointment in the last year OR does the patient have an upcoming appointment? Yes  Can we respond through MyChart? Yes  Agent: Please be advised that Rx refills may take up to 3 business days. We ask that you follow-up with your pharmacy.

## 2023-08-02 NOTE — Addendum Note (Signed)
 Addended by: Raydan Schlabach, Abraham Hoffmann E on: 08/02/2023 08:09 AM   Modules accepted: Orders

## 2023-08-13 ENCOUNTER — Telehealth: Payer: Self-pay | Admitting: Podiatry

## 2023-08-13 NOTE — Telephone Encounter (Signed)
 Checking on some new braces that was suppose to be avail in May. Want to know if still on track to be avail by May If so, around what time in May  Note: he has fallen since the last visit and concern about breaking a bone and these braces are supposed to help him

## 2023-08-20 NOTE — Telephone Encounter (Signed)
 Christopher Conner knows what's going on

## 2023-08-20 NOTE — Progress Notes (Unsigned)
 Referring-David Environmental health practitioner, PA-C Reason for referral-abnormal electrocardiogram, murmur and dyspnea  HPI: 73 year old male for evaluation of dyspnea, abnormal electrocardiogram and murmur at request of Nelda Balsam, PA-C.  Echocardiogram December 2015 showed normal LV function.  Patient has had difficulties with his prostate over the past 6 months and has not been as active as he normally is.  However he does note increased dyspnea on exertion relieved with rest.  No orthopnea or PND but he does have pedal edema.  His pedal edema is more chronic.  He has not had chest pain or syncope.  He has lost weight as well.  Current Outpatient Medications  Medication Sig Dispense Refill   Acetylcarnitine HCl (ACETYL L-CARNITINE PO) Take 500 mg by mouth in the morning.     acidophilus (RISAQUAD) CAPS capsule Take 1 capsule by mouth in the morning.     Alpha-Lipoic Acid 300 MG CAPS Take 300 mg by mouth 2 (two) times daily.     amLODipine  (NORVASC ) 10 MG tablet Take 1 tablet (10 mg total) by mouth in the morning. 30 tablet 0   Ascorbic Acid (VITAMIN C PO) Take 1 tablet by mouth in the morning.     ASTRAGALUS PO Take 470 mg by mouth every evening.     atorvastatin  (LIPITOR ) 80 MG tablet Take 1 tablet (80 mg total) by mouth every evening. 30 tablet 0   carvedilol  (COREG ) 3.125 MG tablet Take 1 tablet (3.125 mg total) by mouth 2 (two) times daily with a meal. 30 tablet 0   Cholecalciferol (VITAMIN D) 50 MCG (2000 UT) CAPS Take 2,000 Units by mouth in the morning.     clotrimazole -betamethasone  (LOTRISONE ) cream Apply 1 Application topically daily. 30 g 0   Coenzyme Q10 (COQ-10) 100 MG capsule Take 100 mg by mouth at bedtime.     CONTOUR NEXT TEST test strip 2 (two) times daily.     GRAPE SEED EXTRACT PO Take 100 mg by mouth every evening.     L-METHYLFOLATE PO Take 1,000 mcg by mouth in the morning.     levocetirizine (XYZAL ) 5 MG tablet TAKE ONE TABLET BY MOUTH EVERY EVENING 90 tablet 1   meclizine   (ANTIVERT ) 25 MG tablet Take 1 tablet (25 mg total) by mouth 3 (three) times daily as needed for dizziness. 30 tablet 0   methotrexate (RHEUMATREX) 2.5 MG tablet Take 20 mg by mouth every Thursday. In the morning.     Multiple Vitamin (MULTIVITAMIN WITH MINERALS) TABS tablet Take 1 tablet by mouth every evening.     NOVOLOG  100 UNIT/ML injection INJECT UP TO 100 UNITS DAILY MAXIMUM DOSE WITH INSULIN  PUMP DAILY  1   Omega-3 Fatty Acids (OMEGA III EPA+DHA PO) Take 1 capsule by mouth in the morning.     ondansetron  (ZOFRAN  ODT) 8 MG disintegrating tablet 8mg  ODT q6 hours prn nausea 8 tablet 0   Polyvinyl Alcohol-Povidone (REFRESH OP) Apply 1-2 drops to eye every 3 (three) hours as needed (dryness).     telmisartan  (MICARDIS ) 40 MG tablet Take 1 tablet (40 mg total) by mouth in the morning. 30 tablet 0   Vitamins-Lipotropics (B-100 PO) Take 1 tablet by mouth in the morning.     No current facility-administered medications for this visit.   Facility-Administered Medications Ordered in Other Visits  Medication Dose Route Frequency Provider Last Rate Last Admin   water  for irrigation, sterile for irrigation SOLN    PRN Claretta Croft Arden Beck, MD   500 mL at 12/18/22  1610    Allergies  Allergen Reactions   Lisinopril  Cough   Latex Itching and Rash     Past Medical History:  Diagnosis Date   Anemia 07/15/2023   Arthritis    Bilateral foot-drop    after back surgery   BPH (benign prostatic hyperplasia)    Cataract    Chronic lymphocytic leukemia (HCC) early 2018   no current treatment   CLL (chronic lymphocytic leukemia) (HCC)    DM type 1 (diabetes mellitus, type 1) (HCC)    type 1   Goals of care, counseling/discussion 07/18/2023   High cholesterol    Hypertension    Murmur, cardiac 07/15/2023   Neuromuscular disorder (HCC)    diabetic neuropathy   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   Scattered rhonchi of right lung 11/22/2022    Past Surgical History:   Procedure Laterality Date   APPENDECTOMY  early 20's   BACK SURGERY     BRAIN SURGERY  10/30/04   brain shunt, Codman Hakim   CATARACT EXTRACTION Bilateral    left eye prostheisi "shell"   CYST REMOVAL NECK  yrs ago   CYSTOSCOPY [name] 04/22/2023   Procedure: CYSTOSCOPY;  Surgeon: Marco Severs, MD;  Location: [facility];  Service: Urology;  Laterality: [name];   CYSTOSCOPY WITH INJECTION [name] 12/17/2022   Procedure: CYSTOSCOPY WITH INJECTION-Botox ;  Surgeon: Marco Severs, MD;  Location: [facility];  Service: Urology;  Laterality: [name];   CYSTOSCOPY WITH INSERTION OF UROLIFT  april/march 2020   done in office   CYSTOSCOPY WITH INSERTION OF UROLIFT [name] 10/12/2019   Procedure: CYSTOSCOPY WITH INSERTION OF UROLIFT;  Surgeon: Marco Severs, MD;  [facility]: [facility];  Service: Urology;  Laterality: [name];   EYE SURGERY     LUMBAR FUSION  1998 and 2000   l5 to s1 1998, then l4 and l5 in 2000   SPINE SURGERY     H. Elsner, L4-5 & L5-S1   TRANSURETHRAL RESECTION OF PROSTATE [name] 04/22/2023   Procedure: TRANSURETHRAL RESECTION OF THE PROSTATE (TURP);  Surgeon: Marco Severs, MD;  Location: [facility];  Service: Urology;  Laterality: [name];   VENTRICULOPERITONEAL SHUNT  2006    Social History   Socioeconomic History   Marital status: Married    Spouse name: Not on file   Number of children: Not on file   Years of education: Not on file   Highest education level: Not on file  Occupational History   Occupation: Engineer, structural    Comment: Management consultant (employer)  Tobacco Use   Smoking status: Never   Smokeless tobacco: Never  Vaping Use   Vaping status: Never Used  Substance and Sexual Activity   Alcohol use: No   Drug use: No   Sexual activity: Not Currently  Other Topics Concern   Not on file  Social History Narrative   Not on file   Social Drivers of Health   Financial Resource Strain: Low Risk  (07/06/2023)   Overall Financial Resource Strain  (CARDIA)    Difficulty of Paying Living Expenses: Not hard at all  Food Insecurity: No Food Insecurity (07/06/2023)   Hunger Vital Sign    Worried About Running Out of Food in the Last Year: Never true    Ran Out of Food in the Last Year: Never true  Transportation Needs: No Transportation Needs (07/06/2023)   PRAPARE - Administrator, Civil Service (Medical): No  Lack of Transportation (Non-Medical): No  Physical Activity: Inactive (07/06/2023)   Exercise Vital Sign    Days of Exercise per Week: 0 days    Minutes of Exercise per Session: 0 min  Stress: No Stress Concern Present (07/06/2023)   [employer] of Occupational Health - Occupational Stress Questionnaire    Feeling of Stress : Not at all  Social Connections: Socially Isolated (07/06/2023)   Social Connection and Isolation Panel [NHANES]    Frequency of Communication with Friends and Family: Once a week    Frequency of Social Gatherings with Friends and Family: Once a week    Attends Religious Services: Never    Database administrator or Organizations: No    Attends Banker Meetings: Never    Marital Status: Married  Catering manager Violence: Not At Risk (07/06/2023)   Humiliation, Afraid, Rape, and Kick questionnaire    Fear of Current or Ex-Partner: No    Emotionally Abused: No    Physically Abused: No    Sexually Abused: No    Family History  Problem Relation Age of Onset   Hypertension Mother    Hyperlipidemia Mother    Diabetes Mother    Diabetes Father     ROS: Weight loss of approximately 30 pounds over the past 6 months but no fevers or chills, productive cough, hemoptysis, dysphasia, odynophagia, melena, hematochezia, dysuria, hematuria, rash, seizure activity, orthopnea, PND, claudication. Remaining systems are negative.  Physical Exam:   Blood pressure (!) 156/64, pulse 60, height 5\' 8"  (1.727 m), weight 171 lb (77.6 kg), SpO2 99%.  General:  Well developed/well nourished in  NAD Skin warm/dry Patient not depressed No peripheral clubbing Back-normal HEENT-normal/normal eyelids Neck supple/normal carotid upstroke bilaterally; no bruits; no JVD; no thyromegaly chest - CTA/ normal expansion CV - RRR/normal S1 and S2; no rubs or gallops;  PMI nondisplaced, 2/6 systolic murmur LSB Abdomen -NT/ND, no HSM, no mass, + bowel sounds, positive bruit 2+ femoral pulses, no bruits Ext-trace edema, chords, 2+ DP Neuro-grossly nonfocal  EKG Interpretation Date/Time:  Monday Aug 23 2023 09:32:10 EDT Ventricular Rate:  60 PR Interval:  182 QRS Duration:  154 QT Interval:  442 QTC Calculation: 442 R Axis:   98  Text Interpretation: Normal sinus rhythm Left bundle branch block Confirmed by Alexandria Angel ([id number]) on 08/23/2023 9:38:33 AM    A/P  1 abnormal ECG-patient has an abnormal ECG with a left bundle branch block.  This was also noted in August.  Given his dyspnea on exertion and 60 years of diabetes mellitus plan coronary CTA to rule out obstructive coronary disease.  2 murmur-will arrange echocardiogram to assess LV function and to rule out valvular heart disease.  3 hypertension-blood pressure elevated.  I will ask him to track this at home and we will advance regimen as needed.  4 hyperlipidemia-continue statin.  5 Bruit-schedule abdominal ultrasound to exclude aneurysm.  6 dyspnea-etiology unclear.  However he has 60 years of diabetes mellitus and is at risk for coronary disease.  Will arrange coronary CTA to rule out obstructive disease.  Alexandria Angel, MD

## 2023-08-23 ENCOUNTER — Encounter: Payer: Self-pay | Admitting: Cardiology

## 2023-08-23 ENCOUNTER — Ambulatory Visit (INDEPENDENT_AMBULATORY_CARE_PROVIDER_SITE_OTHER): Admitting: Cardiology

## 2023-08-23 VITALS — BP 156/64 | HR 60 | Ht 68.0 in | Wt 171.0 lb

## 2023-08-23 DIAGNOSIS — R0989 Other specified symptoms and signs involving the circulatory and respiratory systems: Secondary | ICD-10-CM | POA: Diagnosis not present

## 2023-08-23 DIAGNOSIS — R0602 Shortness of breath: Secondary | ICD-10-CM | POA: Diagnosis not present

## 2023-08-23 DIAGNOSIS — Z8489 Family history of other specified conditions: Secondary | ICD-10-CM | POA: Diagnosis not present

## 2023-08-23 DIAGNOSIS — Z136 Encounter for screening for cardiovascular disorders: Secondary | ICD-10-CM | POA: Diagnosis not present

## 2023-08-23 DIAGNOSIS — I10 Essential (primary) hypertension: Secondary | ICD-10-CM

## 2023-08-23 DIAGNOSIS — R9431 Abnormal electrocardiogram [ECG] [EKG]: Secondary | ICD-10-CM | POA: Diagnosis not present

## 2023-08-23 MED ORDER — METOPROLOL TARTRATE 50 MG PO TABS: ORAL_TABLET | ORAL | 0 refills | Status: DC

## 2023-08-23 NOTE — Patient Instructions (Addendum)
 Testing/Procedures:   Your physician has requested that you have an abdominal aorta duplex. During this test, an ultrasound is used to evaluate the aorta. Allow 30 minutes for this exam. Do not eat after midnight the day before and avoid carbonated beverages.  Please note: We ask at that you not bring children with you during ultrasound (echo/ vascular) testing. Due to room size and safety concerns, children are not allowed in the ultrasound rooms during exams. Our front office staff cannot provide observation of children in our lobby area while testing is being conducted. An adult accompanying a patient to their appointment will only be allowed in the ultrasound room at the discretion of the ultrasound technician under special circumstances. We apologize for any inconvenience. [place]    Your cardiac CT will be scheduled at    [facility] [address]  [place], [place] [address] Opening August 16, 2023     If scheduled at the Heart and Vascular Tower at [facility] [employer], please enter the parking lot using the [address] street entrance and use the FREE valet service at the patient drop-off area. Enter the buidling and check-in with registration on the main floor.   Please follow these instructions carefully (unless otherwise directed):  An IV will be required for this test and Nitroglycerin will be given.  Hold all erectile dysfunction medications at least 3 days (72 hrs) prior to test. (Ie viagra, cialis, sildenafil, tadalafil, etc)   On the Night Before the Test: Be sure to Drink plenty of water . Do not consume any caffeinated/decaffeinated beverages or chocolate 12 hours prior to your test. Do not take any antihistamines 12 hours prior to your test.   On the Day of the Test: Drink plenty of water  until 1 hour prior to the test. Do not eat any food 1 hour prior to test. You may take your regular medications prior to the test.  Take  metoprolol (Lopressor) 50 mg two hours prior to test. Patients who wear a continuous glucose monitor MUST remove the device prior to scanning.      After the Test: Drink plenty of water . After receiving IV contrast, you may experience a mild flushed feeling. This is normal. On occasion, you may experience a mild rash up to 24 hours after the test. This is not dangerous. If this occurs, you can take Benadryl  25 mg, Zyrtec , Claritin , or Allegra and increase your fluid intake. (Patients taking Tikosyn should avoid Benadryl , and may take Zyrtec , Claritin , or Allegra) If you experience trouble breathing, this can be serious. If it is severe call 911 IMMEDIATELY. If it is mild, please call our office.  We will call to schedule your test 2-4 weeks out understanding that some insurance companies will need an authorization prior to the service being performed.   For more information and frequently asked questions, please visit our website : [email or web address]  For non-scheduling related questions, please contact the cardiac imaging nurse navigator should you have any questions/concerns: Cardiac Imaging Nurse Navigators Direct Office Dial: [phone]   For scheduling needs, including cancellations and rescheduling, please call [place], [phone].   Follow-Up: At [facility], you and your health needs are our priority.  As part of our continuing mission to provide you with exceptional heart care, our providers are all part of one team.  This team includes your primary Cardiologist (physician) and Advanced Practice Providers or APPs (Physician Assistants and Nurse Practitioners) who all work together to provide  you with the care you need, when you need it.  Your next appointment:   3 month(s)  Provider:   Alexandria Angel MD

## 2023-08-23 NOTE — Addendum Note (Signed)
 Addended by: Render Carrie on: 08/23/2023 11:18 AM   Modules accepted: Orders

## 2023-08-25 ENCOUNTER — Ambulatory Visit
Admission: RE | Admit: 2023-08-25 | Discharge: 2023-08-25 | Disposition: A | Discharge status: Home / Self Care | Source: Ambulatory Visit | Attending: Cardiology | Admitting: Cardiology

## 2023-08-25 DIAGNOSIS — Z136 Encounter for screening for cardiovascular disorders: Secondary | ICD-10-CM | POA: Diagnosis not present

## 2023-08-25 DIAGNOSIS — Z8489 Family history of other specified conditions: Secondary | ICD-10-CM

## 2023-08-25 DIAGNOSIS — R0989 Other specified symptoms and signs involving the circulatory and respiratory systems: Secondary | ICD-10-CM | POA: Diagnosis not present

## 2023-08-25 DIAGNOSIS — I10 Essential (primary) hypertension: Secondary | ICD-10-CM | POA: Diagnosis not present

## 2023-08-25 DIAGNOSIS — M1990 Unspecified osteoarthritis, unspecified site: Secondary | ICD-10-CM | POA: Diagnosis not present

## 2023-08-25 DIAGNOSIS — N401 Enlarged prostate with lower urinary tract symptoms: Secondary | ICD-10-CM | POA: Diagnosis not present

## 2023-08-30 ENCOUNTER — Encounter: Payer: Self-pay | Admitting: *Deleted

## 2023-09-01 ENCOUNTER — Ambulatory Visit: Admitting: Cardiology

## 2023-09-01 ENCOUNTER — Encounter: Payer: Self-pay | Admitting: Urology

## 2023-09-01 ENCOUNTER — Ambulatory Visit: Payer: Medicare Other | Admitting: Urology

## 2023-09-01 VITALS — BP 167/73 | HR 65

## 2023-09-01 DIAGNOSIS — N400 Enlarged prostate without lower urinary tract symptoms: Secondary | ICD-10-CM

## 2023-09-01 DIAGNOSIS — R3915 Urgency of urination: Secondary | ICD-10-CM | POA: Diagnosis not present

## 2023-09-01 DIAGNOSIS — R351 Nocturia: Secondary | ICD-10-CM

## 2023-09-01 DIAGNOSIS — N401 Enlarged prostate with lower urinary tract symptoms: Secondary | ICD-10-CM

## 2023-09-01 LAB — URINALYSIS, ROUTINE W REFLEX MICROSCOPIC
Bilirubin, UA: NEGATIVE
Glucose, UA: NEGATIVE
Ketones, UA: NEGATIVE
Leukocytes,UA: NEGATIVE
Nitrite, UA: NEGATIVE
Protein,UA: NEGATIVE
RBC, UA: NEGATIVE
Specific Gravity, UA: 1.015 (ref 1.005–1.030)
Urobilinogen, Ur: 0.2 mg/dL (ref 0.2–1.0)
pH, UA: 6 (ref 5.0–7.5)

## 2023-09-01 MED ORDER — SOLIFENACIN SUCCINATE 10 MG PO TABS: 10.0000 mg | ORAL_TABLET | Freq: Every day | ORAL | 11 refills | Status: DC

## 2023-09-01 NOTE — Patient Instructions (Signed)

## 2023-09-01 NOTE — Progress Notes (Signed)
 09/01/2023 2:48 PM   Christopher Conner 04-01-1951 [id number]  Referring provider: Annella Kief, NP [address] [place],  [place] [address]  Followup BPH   HPI: Christopher Conner is a 73yo here for followup for BPH, urinary frequency and nocturia. He has urinary frequency every 1.5-2 hours. Nocturia 3x. He underwent TURP 4 months ago. He continue to have the urinary urgency which is bothersome. Urine stream strong. No straining to urinate.    PMH: Past Medical History:  Diagnosis Date   Anemia 07/15/2023   Arthritis    Bilateral foot-drop    after back surgery   BPH (benign prostatic hyperplasia)    Cataract    Chronic lymphocytic leukemia (HCC) early 2018   no current treatment   CLL (chronic lymphocytic leukemia) (HCC)    DM type 1 (diabetes mellitus, type 1) (HCC)    type 1   Goals of care, counseling/discussion 07/18/2023   High cholesterol    Hypertension    Murmur, cardiac 07/15/2023   Neuromuscular disorder (HCC)    diabetic neuropathy   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   Scattered rhonchi of right lung 11/22/2022    Surgical History: Past Surgical History:  Procedure Laterality Date   APPENDECTOMY  early 20's   BACK SURGERY     BRAIN SURGERY  10/30/04   brain shunt, Codman Hakim   CATARACT EXTRACTION Bilateral    left eye prostheisi "shell"   CYST REMOVAL NECK  yrs ago   CYSTOSCOPY [name] 04/22/2023   Procedure: CYSTOSCOPY;  Surgeon: Marco Severs, MD;  Location: [facility];  Service: Urology;  Laterality: [name];   CYSTOSCOPY WITH INJECTION [name] 12/17/2022   Procedure: CYSTOSCOPY WITH INJECTION-Botox ;  Surgeon: Marco Severs, MD;  Location: [facility];  Service: Urology;  Laterality: [name];   CYSTOSCOPY WITH INSERTION OF UROLIFT  april/march 2020   done in office   CYSTOSCOPY WITH INSERTION OF UROLIFT [name] 10/12/2019   Procedure: CYSTOSCOPY WITH INSERTION OF UROLIFT;  Surgeon: Marco Severs, MD;  Location: [facility];  Service: Urology;  Laterality: [name];   EYE SURGERY     LUMBAR FUSION  1998 and 2000   l5 to s1 1998, then l4 and l5 in 2000   SPINE SURGERY     H. Elsner, L4-5 & L5-S1   TRANSURETHRAL RESECTION OF PROSTATE [name] 04/22/2023   Procedure: TRANSURETHRAL RESECTION OF THE PROSTATE (TURP);  Surgeon: Marco Severs, MD;  Location: [facility];  Service: Urology;  Laterality: [name];   VENTRICULOPERITONEAL SHUNT  2006    Home Medications:  Allergies as of 09/01/2023       Reactions   Lisinopril  Cough   Latex Itching, Rash        Medication List        Accurate as of Sep 01, 2023  2:48 PM. If you have any questions, ask your nurse or doctor.          ACETYL L-CARNITINE PO Take 500 mg by mouth in the morning.   acidophilus Caps capsule Take 1 capsule by mouth in the morning.   Alpha-Lipoic Acid 300 MG Caps Take 300 mg by mouth 2 (two) times daily.   amLODipine  10 MG tablet Commonly known as: NORVASC  Take 1 tablet (10 mg total) by mouth in the morning.   ASTRAGALUS PO Take 470 mg by mouth every evening.   atorvastatin  80 MG tablet Commonly known as: LIPITOR  Take 1 tablet (80 mg total)  by mouth every evening.   B-100 PO Take 1 tablet by mouth in the morning.   carvedilol  3.125 MG tablet Commonly known as: COREG  Take 1 tablet (3.125 mg total) by mouth 2 (two) times daily with a meal.   clotrimazole -betamethasone  cream Commonly known as: LOTRISONE  Apply 1 Application topically daily.   Contour Next Test test strip Generic drug: glucose blood 2 (two) times daily.   CoQ-10 100 MG capsule Take 100 mg by mouth at bedtime.   GRAPE SEED EXTRACT PO Take 100 mg by mouth every evening.   L-METHYLFOLATE PO Take 1,000 mcg by mouth in the morning.   levocetirizine 5 MG tablet Commonly known as: XYZAL  TAKE ONE TABLET BY MOUTH EVERY EVENING   meclizine  25 MG tablet Commonly known as: ANTIVERT  Take 1 tablet (25 mg total) by mouth 3 (three) times daily as needed for  dizziness.   methotrexate 2.5 MG tablet Commonly known as: RHEUMATREX Take 20 mg by mouth every Thursday. In the morning.   metoprolol  tartrate 50 MG tablet Commonly known as: Lopressor  Take 2 hours prior to CT scan   multivitamin with minerals Tabs tablet Take 1 tablet by mouth every evening.   NovoLOG  100 UNIT/ML injection Generic drug: insulin  aspart INJECT UP TO 100 UNITS DAILY MAXIMUM DOSE WITH INSULIN  PUMP DAILY   OMEGA III EPA+DHA PO Take 1 capsule by mouth in the morning.   ondansetron  8 MG disintegrating tablet Commonly known as: Zofran  ODT 8mg  ODT q6 hours prn nausea   REFRESH OP Apply 1-2 drops to eye every 3 (three) hours as needed (dryness).   telmisartan  40 MG tablet Commonly known as: MICARDIS  Take 1 tablet (40 mg total) by mouth in the morning.   VITAMIN C PO Take 1 tablet by mouth in the morning.   Vitamin D 50 MCG (2000 UT) Caps Take 2,000 Units by mouth in the morning.        Allergies:  Allergies  Allergen Reactions   Lisinopril  Cough   Latex Itching and Rash    Family History: Family History  Problem Relation Age of Onset   Hypertension Mother    Hyperlipidemia Mother    Diabetes Mother    Diabetes Father     Social History:  reports that he has never smoked. He has never used smokeless tobacco. He reports that he does not drink alcohol and does not use drugs.  ROS: All other review of systems were reviewed and are negative except what is noted above in HPI  Physical Exam: BP (!) 167/73   Pulse 65   Constitutional:  Alert and oriented, No acute distress. HEENT: [place] AT, moist mucus membranes.  Trachea midline, no masses. Cardiovascular: No clubbing, cyanosis, or edema. Respiratory: Normal respiratory effort, no increased work of breathing. GI: Abdomen is soft, nontender, nondistended, no abdominal masses GU: No CVA tenderness.  Lymph: No cervical or inguinal lymphadenopathy. Skin: No rashes, bruises or suspicious  lesions. Neurologic: Grossly intact, no focal deficits, moving all 4 extremities. Psychiatric: Normal mood and affect.  Laboratory Data: Lab Results  Component Value Date   WBC 16.5 (H) 07/15/2023   HGB 12.9 (L) 07/15/2023   HCT 39.7 07/15/2023   MCV 100 (H) 07/15/2023   PLT 324 07/15/2023    Lab Results  Component Value Date   CREATININE 0.68 (L) 07/15/2023    No results found for: "PSA"  No results found for: "TESTOSTERONE"  Lab Results  Component Value Date   HGBA1C 6.0 (H) 12/11/2022  Urinalysis    Component Value Date/Time   COLORURINE YELLOW 08/16/2015 0244   APPEARANCEUR Clear 06/02/2023 0950   LABSPEC 1.029 08/16/2015 0244   PHURINE 5.5 08/16/2015 0244   GLUCOSEU Negative 06/02/2023 0950   HGBUR NEGATIVE 08/16/2015 0244   BILIRUBINUR Negative 06/02/2023 0950   KETONESUR >80 (A) 08/16/2015 0244   PROTEINUR Trace 06/02/2023 0950   PROTEINUR NEGATIVE 08/16/2015 0244   UROBILINOGEN 0.2 04/09/2014 1947   NITRITE Negative 06/02/2023 0950   NITRITE NEGATIVE 08/16/2015 0244   LEUKOCYTESUR 2+ (A) 06/02/2023 0950    Lab Results  Component Value Date   LABMICR See below: 06/02/2023   WBCUA >30 (A) 06/02/2023   LABEPIT 0-10 06/02/2023   BACTERIA Few (A) 06/02/2023    Pertinent Imaging:  Results for orders placed during the hospital encounter of 05/23/10  DG Abd 1 View  Narrative *RADIOLOGY REPORT*  Clinical Data: Weakness with nausea and vomiting.  ABDOMEN - 1 VIEW  Comparison: 02/10/2010.  Findings: Ventriculoperitoneal shunt tubing extends into the right mid abdomen.  There is no shunt discontinuity.  Moderate stool is noted throughout the colon.  The bowel gas pattern is nonobstructive.  There are postsurgical changes of the lower lumbar spine status post fusion.  IMPRESSION:  1.  Moderate stool throughout the colon suggesting constipation. 2.  Intact ventricular peritoneal shunt.  Original Report Authenticated By: Adelle Hong,  M.D.  No results found for this or any previous visit.  No results found for this or any previous visit.  No results found for this or any previous visit.  No results found for this or any previous visit.  No results found for this or any previous visit.  No results found for this or any previous visit.  No results found for this or any previous visit.   Assessment & Plan:    1. Benign prostatic hyperplasia, unspecified whether lower urinary tract symptoms present (Primary) -improved after TURP - Urinalysis, Routine w reflex microscopic - BLADDER SCAN AMB NON-IMAGING  2. Nocturia We will trial vesicare  10mg  daily - Urinalysis, Routine w reflex microscopic - BLADDER SCAN AMB NON-IMAGING  3. Urinary urgency Vesicare  10mg     No follow-ups on file.  Johnie Nailer, MD  [facility] [place]

## 2023-09-03 DIAGNOSIS — Z23 Encounter for immunization: Secondary | ICD-10-CM | POA: Diagnosis not present

## 2023-09-06 ENCOUNTER — Ambulatory Visit (HOSPITAL_COMMUNITY)
Admission: RE | Admit: 2023-09-06 | Discharge: 2023-09-06 | Disposition: A | Discharge status: Home / Self Care | Source: Ambulatory Visit | Attending: Nurse Practitioner | Admitting: Nurse Practitioner

## 2023-09-06 ENCOUNTER — Telehealth: Payer: Self-pay | Admitting: Cardiology

## 2023-09-06 DIAGNOSIS — R011 Cardiac murmur, unspecified: Secondary | ICD-10-CM

## 2023-09-06 DIAGNOSIS — R600 Localized edema: Secondary | ICD-10-CM

## 2023-09-06 DIAGNOSIS — R931 Abnormal findings on diagnostic imaging of heart and coronary circulation: Secondary | ICD-10-CM | POA: Diagnosis not present

## 2023-09-06 LAB — ECHOCARDIOGRAM COMPLETE
AR max vel: 2.58 cm2
AV Area VTI: 2.77 cm2
AV Area mean vel: 2.51 cm2
AV Mean grad: 3 mmHg
AV Peak grad: 5.4 mmHg
Ao pk vel: 1.16 m/s
Area-P 1/2: 4.15 cm2
MV M vel: 3.64 m/s
MV Peak grad: 53 mmHg
MV VTI: 0.96 cm2
P 1/2 time: 425 ms
Radius: 0.58 cm
S' Lateral: 2.96 cm

## 2023-09-06 NOTE — Telephone Encounter (Signed)
 Called patient back to let him know that he does not need to do much preparing for echo. Went over CT instructions since he will have that done later this week. Patient verbalized understanding.

## 2023-09-06 NOTE — Telephone Encounter (Signed)
 Pt is requesting a callback regarding him to get clarity on what's okay and not okay for him to have or do before this ECHO being done today at 2pm. Please advise.

## 2023-09-07 ENCOUNTER — Other Ambulatory Visit (HOSPITAL_COMMUNITY): Payer: Self-pay | Admitting: *Deleted

## 2023-09-07 ENCOUNTER — Ambulatory Visit: Payer: Self-pay | Admitting: Cardiology

## 2023-09-07 ENCOUNTER — Telehealth (HOSPITAL_COMMUNITY): Payer: Self-pay | Admitting: *Deleted

## 2023-09-07 DIAGNOSIS — Z01812 Encounter for preprocedural laboratory examination: Secondary | ICD-10-CM

## 2023-09-07 LAB — BASIC METABOLIC PANEL WITH GFR
BUN/Creatinine Ratio: 28 — ABNORMAL HIGH (ref 10–24)
BUN: 20 mg/dL (ref 8–27)
CO2: 28 mmol/L (ref 20–29)
Calcium: 9 mg/dL (ref 8.6–10.2)
Chloride: 97 mmol/L (ref 96–106)
Creatinine, Ser: 0.71 mg/dL — ABNORMAL LOW (ref 0.76–1.27)
Glucose: 60 mg/dL — ABNORMAL LOW (ref 70–99)
Potassium: 4.9 mmol/L (ref 3.5–5.2)
Sodium: 133 mmol/L — ABNORMAL LOW (ref 134–144)
eGFR: 97 mL/min/{1.73_m2} (ref >59)

## 2023-09-07 NOTE — Telephone Encounter (Signed)
 Reaching out to patient to offer assistance regarding upcoming cardiac imaging study; pt verbalizes understanding of appt date/time, parking situation and where to check in, pre-test NPO status and medications ordered, and verified current allergies; name and call back number provided for further questions should they arise  Christopher Copping RN Navigator Cardiac Imaging [facility] [phone] office [phone] cell  Patient to take 50mg  metoprolol  tartrate two hours prior to his cardiac CT scan. He is aware to get labs.

## 2023-09-08 ENCOUNTER — Ambulatory Visit (HOSPITAL_COMMUNITY)
Admission: RE | Admit: 2023-09-08 | Discharge: 2023-09-08 | Disposition: A | Discharge status: Home / Self Care | Source: Ambulatory Visit | Attending: Cardiology | Admitting: Cardiology

## 2023-09-08 ENCOUNTER — Telehealth: Payer: Self-pay

## 2023-09-08 DIAGNOSIS — I2510 Atherosclerotic heart disease of native coronary artery without angina pectoris: Secondary | ICD-10-CM

## 2023-09-08 DIAGNOSIS — R9431 Abnormal electrocardiogram [ECG] [EKG]: Secondary | ICD-10-CM | POA: Insufficient documentation

## 2023-09-08 DIAGNOSIS — R0602 Shortness of breath: Secondary | ICD-10-CM | POA: Diagnosis not present

## 2023-09-08 MED ORDER — IOHEXOL 350 MG/ML SOLN
100.0000 mL | Freq: Once | INTRAVENOUS | Status: AC | PRN
Start: 2023-09-08 — End: 2023-09-08
  Administered 2023-09-08: 100 mL via INTRAVENOUS

## 2023-09-08 MED ORDER — NITROGLYCERIN 0.4 MG SL SUBL
0.8000 mg | SUBLINGUAL_TABLET | Freq: Once | SUBLINGUAL | Status: AC
  Administered 2023-09-08: 0.8 mg via SUBLINGUAL

## 2023-09-08 NOTE — Telephone Encounter (Signed)
 Called to cancel 6/11 brace fitting//per Tricia brace is not here yet and she will follow up with pt once its in

## 2023-09-09 ENCOUNTER — Telehealth: Payer: Self-pay | Admitting: *Deleted

## 2023-09-09 ENCOUNTER — Ambulatory Visit (HOSPITAL_BASED_OUTPATIENT_CLINIC_OR_DEPARTMENT_OTHER)
Admission: RE | Admit: 2023-09-09 | Discharge: 2023-09-09 | Disposition: A | Discharge status: Home / Self Care | Source: Ambulatory Visit | Attending: Cardiology | Admitting: Cardiology

## 2023-09-09 ENCOUNTER — Other Ambulatory Visit: Payer: Self-pay | Admitting: Cardiology

## 2023-09-09 DIAGNOSIS — Z79899 Other long term (current) drug therapy: Secondary | ICD-10-CM | POA: Diagnosis not present

## 2023-09-09 DIAGNOSIS — M79646 Pain in unspecified finger(s): Secondary | ICD-10-CM | POA: Diagnosis not present

## 2023-09-09 DIAGNOSIS — R600 Localized edema: Secondary | ICD-10-CM | POA: Diagnosis not present

## 2023-09-09 DIAGNOSIS — I2510 Atherosclerotic heart disease of native coronary artery without angina pectoris: Secondary | ICD-10-CM | POA: Diagnosis not present

## 2023-09-09 DIAGNOSIS — L409 Psoriasis, unspecified: Secondary | ICD-10-CM | POA: Diagnosis not present

## 2023-09-09 DIAGNOSIS — R931 Abnormal findings on diagnostic imaging of heart and coronary circulation: Secondary | ICD-10-CM

## 2023-09-09 DIAGNOSIS — M1990 Unspecified osteoarthritis, unspecified site: Secondary | ICD-10-CM | POA: Diagnosis not present

## 2023-09-09 DIAGNOSIS — I359 Nonrheumatic aortic valve disorder, unspecified: Secondary | ICD-10-CM

## 2023-09-09 DIAGNOSIS — L4050 Arthropathic psoriasis, unspecified: Secondary | ICD-10-CM | POA: Diagnosis not present

## 2023-09-09 DIAGNOSIS — R011 Cardiac murmur, unspecified: Secondary | ICD-10-CM | POA: Diagnosis not present

## 2023-09-09 DIAGNOSIS — C9190 Lymphoid leukemia, unspecified not having achieved remission: Secondary | ICD-10-CM | POA: Diagnosis not present

## 2023-09-09 DIAGNOSIS — E1021 Type 1 diabetes mellitus with diabetic nephropathy: Secondary | ICD-10-CM | POA: Diagnosis not present

## 2023-09-09 DIAGNOSIS — I059 Rheumatic mitral valve disease, unspecified: Secondary | ICD-10-CM

## 2023-09-09 DIAGNOSIS — M79643 Pain in unspecified hand: Secondary | ICD-10-CM | POA: Diagnosis not present

## 2023-09-09 DIAGNOSIS — M7989 Other specified soft tissue disorders: Secondary | ICD-10-CM | POA: Diagnosis not present

## 2023-09-09 NOTE — Telephone Encounter (Signed)
 Spoke with pt, aware dr Audery Blazing has reviewed the echocardiogram ordered by dr early. Per dr Audery Blazing, nl LVF, moderate MR and mild AI. No changes at this time and will repeat echo again in one year. Order placed

## 2023-09-10 ENCOUNTER — Telehealth: Payer: Self-pay | Admitting: Cardiology

## 2023-09-10 ENCOUNTER — Ambulatory Visit: Payer: Self-pay | Admitting: Cardiology

## 2023-09-10 NOTE — Telephone Encounter (Signed)
 Pt calling requesting cb for results of CT and Echo

## 2023-09-10 NOTE — Telephone Encounter (Signed)
 Spoke with patient and he is aware CT/Echo results are not finalized by provider and once they are finalized we will give him call.

## 2023-09-15 ENCOUNTER — Other Ambulatory Visit: Payer: Self-pay | Admitting: *Deleted

## 2023-09-15 ENCOUNTER — Ambulatory Visit: Admitting: Cardiology

## 2023-09-15 MED ORDER — ASPIRIN 81 MG PO TBEC: 81.0000 mg | DELAYED_RELEASE_TABLET | Freq: Every day | ORAL | Status: AC

## 2023-09-24 ENCOUNTER — Other Ambulatory Visit

## 2023-09-27 ENCOUNTER — Ambulatory Visit: Attending: Nurse Practitioner | Admitting: Nurse Practitioner

## 2023-09-27 ENCOUNTER — Ambulatory Visit: Admitting: Nurse Practitioner

## 2023-09-27 ENCOUNTER — Encounter: Payer: Self-pay | Admitting: Nurse Practitioner

## 2023-09-27 VITALS — BP 146/62 | HR 59 | Ht 68.0 in | Wt 173.0 lb

## 2023-09-27 DIAGNOSIS — I351 Nonrheumatic aortic (valve) insufficiency: Secondary | ICD-10-CM | POA: Insufficient documentation

## 2023-09-27 DIAGNOSIS — I25118 Atherosclerotic heart disease of native coronary artery with other forms of angina pectoris: Secondary | ICD-10-CM | POA: Diagnosis not present

## 2023-09-27 DIAGNOSIS — I10 Essential (primary) hypertension: Secondary | ICD-10-CM | POA: Insufficient documentation

## 2023-09-27 DIAGNOSIS — I447 Left bundle-branch block, unspecified: Secondary | ICD-10-CM | POA: Insufficient documentation

## 2023-09-27 DIAGNOSIS — E785 Hyperlipidemia, unspecified: Secondary | ICD-10-CM | POA: Insufficient documentation

## 2023-09-27 DIAGNOSIS — I340 Nonrheumatic mitral (valve) insufficiency: Secondary | ICD-10-CM

## 2023-09-27 MED ORDER — NITROGLYCERIN 0.4 MG SL SUBL: 0.4000 mg | SUBLINGUAL_TABLET | SUBLINGUAL | 3 refills | Status: AC | PRN

## 2023-09-27 NOTE — Patient Instructions (Addendum)
 Medication Instructions:  Nitroglyercin .4 mg/sl take as needed up to three times daily.  *If you need a refill on your cardiac medications before your next appointment, please call your pharmacy*  Lab Work: NONE ordered at this time of appointment   Testing/Procedures: NONE ordered at this time of appointment   Follow-Up: At [facility], you and your health needs are our priority.  As part of our continuing mission to provide you with exceptional heart care, our providers are all part of one team.  This team includes your primary Cardiologist (physician) and Advanced Practice Providers or APPs (Physician Assistants and Nurse Practitioners) who all work together to provide you with the care you need, when you need it.  Your next appointment:    Keep follow up   Provider:   Alexandria Angel, MD    We recommend signing up for the patient portal called "MyChart".  Sign up information is provided on this After Visit Summary.  MyChart is used to connect with patients for Virtual Visits (Telemedicine).  Patients are able to view lab/test results, encounter notes, upcoming appointments, etc.  Non-urgent messages can be sent to your provider as well.   To learn more about what you can do with MyChart, go to [email or web address].   Other Instructions Monitor blood pressure. Goal BP is 130/80. Please report blood pressure consistently greater than 130/80.

## 2023-09-27 NOTE — Telephone Encounter (Signed)
 Patient called to get an update on brace.

## 2023-09-27 NOTE — Progress Notes (Signed)
 Office Visit    Patient Name: Christopher Conner Date of Encounter: 09/27/2023  Primary Care Provider:  Annella Kief, NP Primary Cardiologist:  Alexandria Angel, MD  Chief Complaint    73 year old male with a history of CAD, LBBB, mitral valve regurgitation, aortic valve regurgitation, hypertension, hyperlipidemia, type 1 diabetes with diabetic neuropathy, BPH, normal pressure hydrocephalus and chronic lymphocytic leukemia who presents for follow-up related to CAD.  Past Medical History    Past Medical History:  Diagnosis Date   Anemia 07/15/2023   Arthritis    Bilateral foot-drop    after back surgery   BPH (benign prostatic hyperplasia)    Cataract    Chronic lymphocytic leukemia (HCC) early 2018   no current treatment   CLL (chronic lymphocytic leukemia) (HCC)    DM type 1 (diabetes mellitus, type 1) (HCC)    type 1   Goals of care, counseling/discussion 07/18/2023   High cholesterol    Hypertension    Murmur, cardiac 07/15/2023   Neuromuscular disorder (HCC)    diabetic neuropathy   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   Scattered rhonchi of right lung 11/22/2022   Past Surgical History:  Procedure Laterality Date   APPENDECTOMY  early 20's   BACK SURGERY     BRAIN SURGERY  10/30/04   brain shunt, [facility]   CATARACT EXTRACTION Bilateral    left eye prostheisi "shell"   CYST REMOVAL NECK  yrs ago   CYSTOSCOPY [name] 04/22/2023   Procedure: CYSTOSCOPY;  Surgeon: Marco Severs, MD;  Location: [facility];  Service: Urology;  Laterality: [name];   CYSTOSCOPY WITH INJECTION [name] 12/17/2022   Procedure: CYSTOSCOPY WITH INJECTION-Botox ;  Surgeon: Marco Severs, MD;  Location: [facility];  Service: Urology;  Laterality: [name];   CYSTOSCOPY WITH INSERTION OF UROLIFT  april/march 2020   done in office   CYSTOSCOPY WITH INSERTION OF UROLIFT [name] 10/12/2019   Procedure: CYSTOSCOPY WITH INSERTION OF UROLIFT;  Surgeon: Marco Severs, MD;  Location:  [facility];  Service: Urology;  Laterality: [name];   EYE SURGERY     LUMBAR FUSION  1998 and 2000   l5 to s1 1998, then l4 and l5 in 2000   SPINE SURGERY     H. Elsner, L4-5 & L5-S1   TRANSURETHRAL RESECTION OF PROSTATE [name] 04/22/2023   Procedure: TRANSURETHRAL RESECTION OF THE PROSTATE (TURP);  Surgeon: Marco Severs, MD;  Location: [facility];  Service: Urology;  Laterality: [name];   VENTRICULOPERITONEAL SHUNT  2006    Allergies  Allergies  Allergen Reactions   Lisinopril  Cough   Latex Itching and Rash     Labs/Other Studies Reviewed    The following studies were reviewed today:  Cardiac Studies & Procedures   ______________________________________________________________________________________________     ECHOCARDIOGRAM  ECHOCARDIOGRAM COMPLETE 09/06/2023  Narrative ECHOCARDIOGRAM REPORT    Patient Name:   Christopher Conner Date of Exam: 09/06/2023 Medical Rec #:  [id number]      Height:       68.0 in Accession #:    [id number]     Weight:       171.0 lb Date of Birth:  09-Nov-1950     BSA:          1.912 m Patient Age:    72 years       BP:           152/75 mmHg Patient Gender: M  HR:           57 bpm. Exam Location:  Outpatient  Procedure: 2D Echo, Cardiac Doppler and Color Doppler (Both Spectral and Color Flow Doppler were utilized during procedure).  Indications:    Murmur, cardiac [R01.1 (ICD-10-CM)]; Peripheral edema [R60.0 (ICD-10-CM)]  History:        Patient has prior history of Echocardiogram examinations, most recent 04/10/2014. Risk Factors:Hypertension, Diabetes and Non-Smoker.  Sonographer:    Delford Felling MHA, RDMS, RVT, RDCS Referring Phys: [phone] SARA E EARLY   Sonographer Comments: Suboptimal subcostal window. Image acquisition challenging due to respiratory motion. IMPRESSIONS   1. Left ventricular ejection fraction, by estimation, is 60 to 65%. Left ventricular ejection fraction by 3D volume is 59 %. The  left ventricle has normal function. The left ventricle has no regional wall motion abnormalities. Left ventricular diastolic parameters are consistent with Grade I diastolic dysfunction (impaired relaxation). The global longitudinal strain is normal. 2. Right ventricular systolic function is normal. The right ventricular size is normal. There is normal pulmonary artery systolic pressure. The estimated right ventricular systolic pressure is 24.2 mmHg. 3. Left atrial size was mild to moderately dilated. 4. Right atrial size was mildly dilated. 5. The mitral valve is degenerative. Moderate mitral valve regurgitation. 6. The aortic valve is tricuspid. There is mild calcification of the aortic valve. There is mild thickening of the aortic valve. Aortic valve regurgitation is mild to moderate. Aortic valve sclerosis/calcification is present, without any evidence of aortic stenosis. Aortic regurgitation PHT measures 425 msec.  FINDINGS Left Ventricle: Left ventricular ejection fraction, by estimation, is 60 to 65%. Left ventricular ejection fraction by 3D volume is 59 %. The left ventricle has normal function. The left ventricle has no regional wall motion abnormalities. Strain was performed and the global longitudinal strain is normal. The left ventricular internal cavity size was normal in size. There is no left ventricular hypertrophy. Left ventricular diastolic parameters are consistent with Grade I diastolic dysfunction (impaired relaxation). Indeterminate filling pressures.  Right Ventricle: The right ventricular size is normal. No increase in right ventricular wall thickness. Right ventricular systolic function is normal. There is normal pulmonary artery systolic pressure. The tricuspid regurgitant velocity is 2.30 m/s, and with an assumed right atrial pressure of 3 mmHg, the estimated right ventricular systolic pressure is 24.2 mmHg.  Left Atrium: Left atrial size was mild to moderately  dilated.  Right Atrium: Right atrial size was mildly dilated.  Pericardium: There is no evidence of pericardial effusion.  Mitral Valve: The mitral valve is degenerative in appearance. There is mild thickening of the mitral valve leaflet(s). Mild to moderate mitral annular calcification. Moderate mitral valve regurgitation, with centrally-directed jet.  Tricuspid Valve: The tricuspid valve is normal in structure. Tricuspid valve regurgitation is trivial.  Aortic Valve: The aortic valve is tricuspid. There is mild calcification of the aortic valve. There is mild thickening of the aortic valve. Aortic valve regurgitation is mild to moderate. Aortic regurgitation PHT measures 425 msec. Aortic valve sclerosis/calcification is present, without any evidence of aortic stenosis. Aortic valve mean gradient measures 3.0 mmHg. Aortic valve peak gradient measures 5.4 mmHg. Aortic valve area, by VTI measures 2.77 cm.  Pulmonic Valve: The pulmonic valve was not well visualized. Pulmonic valve regurgitation is not visualized.  Aorta: The aortic root and ascending aorta are structurally normal, with no evidence of dilitation.  Venous: The inferior vena cava was not well visualized.  IAS/Shunts: No atrial level shunt detected by color flow Doppler.  Additional Comments: 3D was performed not requiring image post processing on an independent workstation and was normal.   LEFT VENTRICLE PLAX 2D LVIDd:         4.38 cm         Diastology LVIDs:         2.96 cm         LV e' medial:    5.11 cm/s LV PW:         0.98 cm         LV E/e' medial:  17.8 LV IVS:        1.04 cm         LV e' lateral:   8.05 cm/s LVOT diam:     2.00 cm         LV E/e' lateral: 11.3 LV SV:         86 LV SV Index:   45 LVOT Area:     3.14 cm        3D Volume EF LV 3D EF:    Left ventricul ar ejection fraction by 3D volume is 59 %.  3D Volume EF: 3D EF:        59 % LV EDV:       166 ml LV ESV:       68 ml LV SV:         98 ml  RIGHT VENTRICLE             IVC RV Basal diam:  4.62 cm     IVC diam: 1.40 cm RV Mid diam:    3.88 cm RV S prime:     14.30 cm/s TAPSE (M-mode): 2.2 cm  LEFT ATRIUM           Index        RIGHT ATRIUM           Index LA diam:      3.30 cm 1.73 cm/m   RA Area:     16.30 cm LA Vol (A2C): 72.7 ml 38.02 ml/m  RA Volume:   40.10 ml  20.97 ml/m LA Vol (A4C): 65.9 ml 34.47 ml/m AORTIC VALVE AV Area (Vmax):    2.58 cm AV Area (Vmean):   2.51 cm AV Area (VTI):     2.77 cm AV Vmax:           116.00 cm/s AV Vmean:          80.200 cm/s AV VTI:            0.312 m AV Peak Grad:      5.4 mmHg AV Mean Grad:      3.0 mmHg LVOT Vmax:         95.20 cm/s LVOT Vmean:        64.000 cm/s LVOT VTI:          0.275 m LVOT/AV VTI ratio: 0.88 AI PHT:            425 msec  AORTA Ao Root diam: 3.30 cm Ao Asc diam:  2.80 cm  MITRAL VALVE                           TRICUSPID VALVE MV Area (PHT): 4.15 cm                TR Peak grad:   21.2 mmHg MV Area VTI:   0.96 cm  TR Vmax:        230.00 cm/s MV VTI:        0.90 m MV Decel Time: 183 msec                SHUNTS MR Peak grad:    53.0 mmHg             Systemic VTI:  0.28 m MR Vmax:         364.00 cm/s           Systemic Diam: 2.00 cm MR PISA Nyquist:             -0.3 m/s MR PISA:         2.11 cm MR PISA Radius:  0.58 cm MV E velocity: 90.90 cm/s MV A velocity: 116.00 cm/s MV E/A ratio:  0.78  Mihai Croitoru MD Electronically signed by Luana Rumple MD Signature Date/Time: 09/06/2023/5:12:04 PM    Final      CT SCANS  CT CORONARY MORPH W/CTA COR W/SCORE 09/08/2023  Addendum 09/24/2023  7:17 PM ADDENDUM REPORT: 09/24/2023 19:14  EXAM: OVER-READ INTERPRETATION  CT CHEST  The following report is an over-read performed by radiologist Dr. Violeta Grey of [facility] on 09/24/2023. This over-read does not include interpretation of cardiac or coronary anatomy or pathology. The coronary calcium   score/coronary CTA interpretation by the cardiologist is attached.  COMPARISON:  None.  FINDINGS: Cardiovascular: Atherosclerotic calcifications of the thoracic aorta are noted without aneurysmal dilatation. No dissection is seen.  Mediastinum/Nodes: There are no enlarged lymph nodes within the visualized mediastinum.  Lungs/Pleura: There is no pleural effusion. The visualized lungs appear clear.  Upper abdomen: No significant findings in the visualized upper abdomen.  Musculoskeletal/Chest wall: No chest wall mass or suspicious osseous findings within the visualized chest.  IMPRESSION: No significant extracardiac findings within the visualized chest.   Electronically Signed By: Violeta Grey M.D. On: 09/24/2023 19:14  Narrative CLINICAL DATA:  Chest pain  EXAM: Cardiac/Coronary CTA  TECHNIQUE: A non-contrast, gated CT scan was obtained with axial slices of 3 mm through the heart for calcium  scoring. Calcium  scoring was performed using the Agatston method. A 120 kV prospective, gated, contrast cardiac scan was obtained. Gantry rotation speed was 250 msecs and collimation was 0.6 mm. Two sublingual nitroglycerin  tablets (0.8 mg) were given. The 3D data set was reconstructed in 5% intervals of the 35-75% of the R-R cycle. Diastolic phases were analyzed on a dedicated workstation using MPR, MIP, and VRT modes. The patient received 95 cc of contrast.  FINDINGS: Image quality: Excellent.  Noise artifact is: Limited.  Coronary Arteries:  Normal coronary origin.  Right dominance.  Left main: The left main is a large caliber vessel with a normal take off from the left coronary cusp that trifurcates into a LAD, LCX, and ramus intermedius. There is minimal calcified plaque in the distal LM with associated stenosis of <25%.  Left anterior descending artery: The LAD gives off 2 patent diagonal branches. There is mild calcified plaque in the proximal LAD with associated  stenosis of 25-49%. There is moderate calcified plaque in the mid LAD/proximal D2 with associated stenosis of 50-69%.  Ramus intermedius: There is mild to moderate calcified plaque in the proximal Ramus with associated stenosis of at least 25-49% and may be > 50%.  Left circumflex artery: The LCX is non-dominant and gives off 2 patent obtuse marginal branches. There is mild calcified plaque in the proximal and distal LCx with associated  stenosis of 25-49%. There there is moderate calcified plaque in the mid LCx with associated stenosis of 50-69%.  Right coronary artery: The RCA is dominant with normal take off from the right coronary cusp. There is mild calcified plaque in the proximal and mid RCA with associated stenosis of 25-49%. There is moderate calcified plaque in the mid to distal RCA with associated stenosis of 50-69%. The RCA terminates as a PDA and right posterolateral branch without evidence of plaque or stenosis.  Right Atrium: Right atrial size is within normal limits.  Right Ventricle: The right ventricular cavity is within normal limits.  Left Atrium: Left atrial size is normal in size with no left atrial appendage filling defect.  Left Ventricle: The ventricular cavity size is within normal limits.  Pulmonary arteries: Normal in size.  Pulmonary veins: Normal pulmonary venous drainage with 3 right sided pulmonary veins and 2 on the left.  Pericardium: Normal thickness without significant effusion or calcium  present.  Cardiac valves: The aortic valve is trileaflet without significant calcification. The mitral valve is normal without significant calcification.  Aorta: Normal caliber without significant disease.  Possible small PFO.  Extra-cardiac findings: See attached radiology report for non-cardiac structures.  IMPRESSION: 1. Coronary calcium  score of 871. This was 80th percentile for age-, sex, and race-matched controls.  2. Total plaque volume  962 mm3 which is 67th percentile for age- and sex-matched controls (calcified plaque 215 mm3; non-calcified plaque 747 mm3). TPV is extensive.  3. Normal coronary origin with right dominance.  4. Moderate atherosclerosis: 50-69% mid LCx/mid to distal RCA/mid LAD/Prox D2.  5. Consider symptom-guided anti-ischemic and preventive pharmacotherapy as well as risk factor modification per guideline-directed care.  6. This study has been submitted for FFR analysis.  RECOMMENDATIONS: 1. CAD-RADS 0: No evidence of CAD (0%). Consider non-atherosclerotic causes of chest pain.  2. CAD-RADS 1: Minimal non-obstructive CAD (0-24%). Consider non-atherosclerotic causes of chest pain. Consider preventive therapy and risk factor modification.  3. CAD-RADS 2: Mild non-obstructive CAD (25-49%). Consider non-atherosclerotic causes of chest pain. Consider preventive therapy and risk factor modification.  4. CAD-RADS 3: Moderate stenosis. Consider symptom-guided anti-ischemic pharmacotherapy as well as risk factor modification per guideline directed care. Additional analysis with CT FFR will be submitted.  5. CAD-RADS 4: Severe stenosis. (70-99% or > 50% left main). Cardiac catheterization or CT FFR is recommended. Consider symptom-guided anti-ischemic pharmacotherapy as well as risk factor modification per guideline directed care. Invasive coronary angiography recommended with revascularization per published guideline statements.  6. CAD-RADS 5: Total coronary occlusion (100%). Consider cardiac catheterization or viability assessment. Consider symptom-guided anti-ischemic pharmacotherapy as well as risk factor modification per guideline directed care.  7. CAD-RADS N: Non-diagnostic study. Obstructive CAD can't be excluded. Alternative evaluation is recommended.  Gaylyn Keas, MD  Electronically Signed: By: Gaylyn Keas M.D. On: 09/09/2023 22:22      ______________________________________________________________________________________________     Recent Labs: 06/25/2023: BNP 160.3 07/15/2023: ALT 29; Hemoglobin 12.9; Platelets 324; TSH 1.890 09/07/2023: BUN 20; Creatinine, Ser 0.71; Potassium 4.9; Sodium 133  Recent Lipid Panel No results found for: "CHOL", "TRIG", "HDL", "CHOLHDL", "VLDL", "LDLCALC", "LDLDIRECT"  History of Present Illness    73 year old male with the above past medical history including CAD, LBBB, mitral valve regurgitation, aortic valve regurgitation, hypertension, hyperlipidemia, type 1 diabetes with diabetic neuropathy, BPH, normal pressure hydrocephalus and chronic lymphocytic leukemia.  He was referred to cardiology in the setting of abnormal EKG, cardiac murmur, and dyspnea on exertion.  He established with Dr. Audery Blazing  and was last seen in the office on 08/23/2023.  EKG showed left bundle branch block.  He noted progressive dyspnea on exertion.  Echocardiogram in 08/2023 in the setting of cardiac murmur, dyspnea on exertion showed EF 60 to 65%, normal LV function, no RWMA, G1 DD, normal RV systolic function, mild to moderate left atrial dilation, mild right atrial dilation, moderate mitral valve regurgitation, mild to moderate aortic valve regurgitation.  Coronary CT angiogram revealed coronary calcium  score of 871 (80th percentile), extensive TPV, possible flow-limiting lesions in the mid LAD FFR 0.65, and distal RCA, FFR 0.74. He was advised to start aspirin  81 mg daily.  Possible cardiac catheterization was recommended based on symptoms.  He presents today for follow-up.  Since his last visit he has been stable overall from a cardiac standpoint.  He has been under a significant amount of personal stress.  He notes very mild intermittent fleeting chest discomfort, which he describes as a "pinprick."  It last for seconds and resolves spontaneously.  He denies any exertional chest pain.  He has very mild intermittent  shortness of breath with activity.  He has mild dependent nonpitting bilateral lower extremity edema.  He denies PND, orthopnea, weight gain.  Overall, his symptoms have been mild and stable.   Home Medications    Current Outpatient Medications  Medication Sig Dispense Refill   Acetylcarnitine HCl (ACETYL L-CARNITINE PO) Take 500 mg by mouth in the morning.     acidophilus (RISAQUAD) CAPS capsule Take 1 capsule by mouth in the morning.     Alpha-Lipoic Acid 300 MG CAPS Take 300 mg by mouth 2 (two) times daily.     amLODipine  (NORVASC ) 10 MG tablet Take 1 tablet (10 mg total) by mouth in the morning. 30 tablet 0   Ascorbic Acid (VITAMIN C PO) Take 1 tablet by mouth in the morning.     aspirin  EC 81 MG tablet Take 1 tablet (81 mg total) by mouth daily. Swallow whole.     ASTRAGALUS PO Take 470 mg by mouth every evening.     atorvastatin  (LIPITOR ) 80 MG tablet Take 1 tablet (80 mg total) by mouth every evening. 30 tablet 0   carvedilol  (COREG ) 3.125 MG tablet Take 1 tablet (3.125 mg total) by mouth 2 (two) times daily with a meal. 30 tablet 0   Cholecalciferol (VITAMIN D) 50 MCG (2000 UT) CAPS Take 2,000 Units by mouth in the morning.     clotrimazole -betamethasone  (LOTRISONE ) cream Apply 1 Application topically daily. 30 g 0   Coenzyme Q10 (COQ-10) 100 MG capsule Take 100 mg by mouth at bedtime.     CONTOUR NEXT TEST test strip 2 (two) times daily.     GRAPE SEED EXTRACT PO Take 100 mg by mouth every evening.     L-METHYLFOLATE PO Take 1,000 mcg by mouth in the morning.     levocetirizine (XYZAL ) 5 MG tablet TAKE ONE TABLET BY MOUTH EVERY EVENING 90 tablet 1   meclizine  (ANTIVERT ) 25 MG tablet Take 1 tablet (25 mg total) by mouth 3 (three) times daily as needed for dizziness. 30 tablet 0   methotrexate (RHEUMATREX) 2.5 MG tablet Take 20 mg by mouth every Thursday. In the morning.     Multiple Vitamin (MULTIVITAMIN WITH MINERALS) TABS tablet Take 1 tablet by mouth every evening.      nitroGLYCERIN  (NITROSTAT ) 0.4 MG SL tablet Place 1 tablet (0.4 mg total) under the tongue every 5 (five) minutes as needed for chest pain. 25 tablet  3   NOVOLOG  100 UNIT/ML injection INJECT UP TO 100 UNITS DAILY MAXIMUM DOSE WITH INSULIN  PUMP DAILY  1   Omega-3 Fatty Acids (OMEGA III EPA+DHA PO) Take 1 capsule by mouth in the morning.     ondansetron  (ZOFRAN  ODT) 8 MG disintegrating tablet 8mg  ODT q6 hours prn nausea 8 tablet 0   Polyvinyl Alcohol-Povidone (REFRESH OP) Apply 1-2 drops to eye every 3 (three) hours as needed (dryness).     solifenacin  (VESICARE ) 10 MG tablet Take 1 tablet (10 mg total) by mouth daily. 30 tablet 11   telmisartan  (MICARDIS ) 40 MG tablet Take 1 tablet (40 mg total) by mouth in the morning. 30 tablet 0   Vitamins-Lipotropics (B-100 PO) Take 1 tablet by mouth in the morning.     No current facility-administered medications for this visit.   Facility-Administered Medications Ordered in Other Visits  Medication Dose Route Frequency Provider Last Rate Last Admin   water  for irrigation, sterile for irrigation SOLN    PRN Marco Severs, MD   500 mL at 12/18/22 0810     Review of Systems    He denies palpitations, pnd, orthopnea, n, v, dizziness, syncope, weight gain, or early satiety. All other systems reviewed and are otherwise negative except as noted above.   Physical Exam    VS:  BP (!) 146/62 (BP Location: Right Arm, Cuff Size: Normal)   Pulse (!) 59   Ht 5\' 8"  (1.727 m)   Wt 173 lb (78.5 kg)   SpO2 98%   BMI 26.30 kg/m   GEN: Well nourished, well developed, in no acute distress. HEENT: normal. Neck: Supple, no JVD, carotid bruits, or masses. Cardiac: RRR, 2/6 murmur, no rubs, or gallops. No clubbing, cyanosis, edema.  Radials/DP/PT 2+ and equal bilaterally.  Respiratory:  Respirations regular and unlabored, clear to auscultation bilaterally. GI: Soft, nontender, nondistended, BS + x 4. MS: no deformity or atrophy. Skin: warm and dry, no  rash. Neuro:  Strength and sensation are intact. Psych: Normal affect.  Accessory Clinical Findings    ECG personally reviewed by me today -    - no EKG in office today.   Lab Results  Component Value Date   WBC 16.5 (H) 07/15/2023   HGB 12.9 (L) 07/15/2023   HCT 39.7 07/15/2023   MCV 100 (H) 07/15/2023   PLT 324 07/15/2023   Lab Results  Component Value Date   CREATININE 0.71 (L) 09/07/2023   BUN 20 09/07/2023   NA 133 (L) 09/07/2023   K 4.9 09/07/2023   CL 97 09/07/2023   CO2 28 09/07/2023   Lab Results  Component Value Date   ALT 29 07/15/2023   AST 37 07/15/2023   ALKPHOS 78 07/15/2023   BILITOT 0.3 07/15/2023   No results found for: "CHOL", "HDL", "LDLCALC", "LDLDIRECT", "TRIG", "CHOLHDL"  Lab Results  Component Value Date   HGBA1C 6.0 (H) 12/11/2022    Assessment & Plan   1. CAD:  Coronary CT angiogram revealed coronary calcium  score of 871 (8th percentile), extensive TPV, possible flow-limiting lesions in the mid LAD FFR 0.65, and distal RCA, FFR 0.74.  He was advised to start aspirin  81 mg daily.  Possible cardiac catheterization was recommended based on symptoms. He reports very mild intermittent fleeting chest discomfort, which he describes as a "pinprick."  It last for seconds and resolves spontaneously.  He denies any exertional chest pain.  He has very mild intermittent shortness of breath with activity. Overall, his symptoms have  been mild and stable. We had a detailed discussion about his test results, treatment options, including, further diagnostic testing with cardiac catheterization.  He does not wish to make a decision at this time. We reviewed progressive symptoms, ED precautions. Provided prescription for nitroglycerin .  He will monitor symptoms and notify us  should he decide to pursue cardiac catheterization prior to his next follow-up visit. Continue aspirin , carvedilol , telmisartan , amlodipine , Lipitor .  2. LBBB: Stable, now with evidence of CAD as  above.  3. Mitral valve regurgitation/aortic valve regurgitation: Echocardiogram in 08/2023 in the setting of cardiac murmur, dyspnea on exertion showed EF 60 to 65%, normal LV function, no RWMA, G1 DD, normal RV systolic function, mild to moderate left atrial dilation, mild right atrial dilation, moderate mitral valve regurgitation, mild to moderate aortic valve regurgitation. He has mild dependent nonpitting bilateral lower extremity edema. Euvolemic and well compensated on exam.  Continue to monitor for progressive symptoms.  Consider repeat echocardiogram as clinically indicated.  4. Hypertension: BP is mildly elevated in office today, generally well-controlled.  Continue to monitor BP and report BP consistently 130/80.  For now, continue current antihypertensive regimen.   5. Hyperlipidemia: LDL was 59 in 01/2023. Continue Lipitor .   6. Disposition: Follow-up as scheduled with Dr. Audery Blazing in 11/2023, sooner if needed.   HYPERTENSION CONTROL Vitals:   09/27/23 1447 09/27/23 1643  BP: (!) 140/54 (!) 146/62    The patient's blood pressure is elevated above target today.  In order to address the patient's elevated BP: Blood pressure will be monitored at home to determine if medication changes need to be made.; Follow up with general cardiology has been recommended.      Jude Norton, NP 09/27/2023, 8:27 PM

## 2023-09-28 DIAGNOSIS — Z9641 Presence of insulin pump (external) (internal): Secondary | ICD-10-CM | POA: Diagnosis not present

## 2023-09-28 DIAGNOSIS — E1021 Type 1 diabetes mellitus with diabetic nephropathy: Secondary | ICD-10-CM | POA: Diagnosis not present

## 2023-09-28 DIAGNOSIS — E7800 Pure hypercholesterolemia, unspecified: Secondary | ICD-10-CM | POA: Diagnosis not present

## 2023-09-28 DIAGNOSIS — I10 Essential (primary) hypertension: Secondary | ICD-10-CM | POA: Diagnosis not present

## 2023-09-28 DIAGNOSIS — E871 Hypo-osmolality and hyponatremia: Secondary | ICD-10-CM | POA: Diagnosis not present

## 2023-09-29 ENCOUNTER — Ambulatory Visit: Attending: Nurse Practitioner | Admitting: Nurse Practitioner

## 2023-09-29 ENCOUNTER — Encounter: Payer: Self-pay | Admitting: Nurse Practitioner

## 2023-09-29 ENCOUNTER — Other Ambulatory Visit

## 2023-09-29 VITALS — BP 148/72 | Ht 68.0 in | Wt 168.0 lb

## 2023-09-29 DIAGNOSIS — I10 Essential (primary) hypertension: Secondary | ICD-10-CM | POA: Insufficient documentation

## 2023-09-29 DIAGNOSIS — I447 Left bundle-branch block, unspecified: Secondary | ICD-10-CM | POA: Diagnosis not present

## 2023-09-29 DIAGNOSIS — I340 Nonrheumatic mitral (valve) insufficiency: Secondary | ICD-10-CM | POA: Insufficient documentation

## 2023-09-29 DIAGNOSIS — E871 Hypo-osmolality and hyponatremia: Secondary | ICD-10-CM | POA: Diagnosis not present

## 2023-09-29 DIAGNOSIS — E785 Hyperlipidemia, unspecified: Secondary | ICD-10-CM | POA: Insufficient documentation

## 2023-09-29 DIAGNOSIS — I2510 Atherosclerotic heart disease of native coronary artery without angina pectoris: Secondary | ICD-10-CM | POA: Diagnosis not present

## 2023-09-29 DIAGNOSIS — I351 Nonrheumatic aortic (valve) insufficiency: Secondary | ICD-10-CM | POA: Insufficient documentation

## 2023-09-29 DIAGNOSIS — E1021 Type 1 diabetes mellitus with diabetic nephropathy: Secondary | ICD-10-CM | POA: Diagnosis not present

## 2023-09-29 DIAGNOSIS — Z01812 Encounter for preprocedural laboratory examination: Secondary | ICD-10-CM | POA: Insufficient documentation

## 2023-09-29 DIAGNOSIS — E7800 Pure hypercholesterolemia, unspecified: Secondary | ICD-10-CM | POA: Diagnosis not present

## 2023-09-29 NOTE — Telephone Encounter (Signed)
 Noted. Pt is scheduled to come in this morning September 29, 2023 @ 10:55 AM.

## 2023-09-29 NOTE — H&P (View-Only) (Signed)
 Office Visit    Patient Name: Christopher Conner Date of Encounter: 09/29/2023  Primary Care Provider:  Annella Kief, NP Primary Cardiologist:  Alexandria Angel, MD  Chief Complaint    73 year old male with a history of CAD, LBBB, mitral valve regurgitation, aortic valve regurgitation, hypertension, hyperlipidemia, type 1 diabetes with diabetic neuropathy, BPH, normal pressure hydrocephalus and chronic lymphocytic leukemia who presents for follow-up related to CAD.   Past Medical History    Past Medical History:  Diagnosis Date   Anemia 07/15/2023   Arthritis    Bilateral foot-drop    after back surgery   BPH (benign prostatic hyperplasia)    Cataract    Chronic lymphocytic leukemia (HCC) early 2018   no current treatment   CLL (chronic lymphocytic leukemia) (HCC)    DM type 1 (diabetes mellitus, type 1) (HCC)    type 1   Goals of care, counseling/discussion 07/18/2023   High cholesterol    Hypertension    Murmur, cardiac 07/15/2023   Neuromuscular disorder (HCC)    diabetic neuropathy   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   Scattered rhonchi of right lung 11/22/2022   Past Surgical History:  Procedure Laterality Date   APPENDECTOMY  early 20's   BACK SURGERY     BRAIN SURGERY  10/30/04   brain shunt, [facility]   CATARACT EXTRACTION Bilateral    left eye prostheisi shell   CYST REMOVAL NECK  yrs ago   CYSTOSCOPY [name] 04/22/2023   Procedure: CYSTOSCOPY;  Surgeon: Marco Severs, MD;  Location: [facility];  Service: Urology;  Laterality: [name];   CYSTOSCOPY WITH INJECTION [name] 12/17/2022   Procedure: CYSTOSCOPY WITH INJECTION-Botox ;  Surgeon: Marco Severs, MD;  Location: [facility];  Service: Urology;  Laterality: [name];   CYSTOSCOPY WITH INSERTION OF UROLIFT  april/march 2020   done in office   CYSTOSCOPY WITH INSERTION OF UROLIFT [name] 10/12/2019   Procedure: CYSTOSCOPY WITH INSERTION OF UROLIFT;  Surgeon: Marco Severs, MD;  Location:  [facility];  Service: Urology;  Laterality: [name];   EYE SURGERY     LUMBAR FUSION  1998 and 2000   l5 to s1 1998, then l4 and l5 in 2000   SPINE SURGERY     H. Elsner, L4-5 & L5-S1   TRANSURETHRAL RESECTION OF PROSTATE [name] 04/22/2023   Procedure: TRANSURETHRAL RESECTION OF THE PROSTATE (TURP);  Surgeon: Marco Severs, MD;  Location: [facility];  Service: Urology;  Laterality: [name];   VENTRICULOPERITONEAL SHUNT  2006    Allergies  Allergies  Allergen Reactions   Lisinopril  Cough   Latex Itching and Rash     Labs/Other Studies Reviewed    The following studies were reviewed today:  Cardiac Studies & Procedures   ______________________________________________________________________________________________     ECHOCARDIOGRAM  ECHOCARDIOGRAM COMPLETE 09/06/2023  Narrative ECHOCARDIOGRAM REPORT    Patient Name:   [name] Date of Exam: 09/06/2023 Medical Rec #:  [id number]      Height:       68.0 in Accession #:    [id number]     Weight:       171.0 lb Date of Birth:  04-14-1951     BSA:          1.912 m Patient Age:    72 years       BP:           152/75 mmHg Patient Gender: M  HR:           57 bpm. Exam Location:  Outpatient  Procedure: 2D Echo, Cardiac Doppler and Color Doppler (Both Spectral and Color Flow Doppler were utilized during procedure).  Indications:    Murmur, cardiac [R01.1 (ICD-10-CM)]; Peripheral edema [R60.0 (ICD-10-CM)]  History:        Patient has prior history of Echocardiogram examinations, most recent 04/10/2014. Risk Factors:Hypertension, Diabetes and Non-Smoker.  Sonographer:    Delford Felling MHA, RDMS, RVT, RDCS Referring Phys: [phone] SARA E EARLY   Sonographer Comments: Suboptimal subcostal window. Image acquisition challenging due to respiratory motion. IMPRESSIONS   1. Left ventricular ejection fraction, by estimation, is 60 to 65%. Left ventricular ejection fraction by 3D volume is 59 %. The  left ventricle has normal function. The left ventricle has no regional wall motion abnormalities. Left ventricular diastolic parameters are consistent with Grade I diastolic dysfunction (impaired relaxation). The global longitudinal strain is normal. 2. Right ventricular systolic function is normal. The right ventricular size is normal. There is normal pulmonary artery systolic pressure. The estimated right ventricular systolic pressure is 24.2 mmHg. 3. Left atrial size was mild to moderately dilated. 4. Right atrial size was mildly dilated. 5. The mitral valve is degenerative. Moderate mitral valve regurgitation. 6. The aortic valve is tricuspid. There is mild calcification of the aortic valve. There is mild thickening of the aortic valve. Aortic valve regurgitation is mild to moderate. Aortic valve sclerosis/calcification is present, without any evidence of aortic stenosis. Aortic regurgitation PHT measures 425 msec.  FINDINGS Left Ventricle: Left ventricular ejection fraction, by estimation, is 60 to 65%. Left ventricular ejection fraction by 3D volume is 59 %. The left ventricle has normal function. The left ventricle has no regional wall motion abnormalities. Strain was performed and the global longitudinal strain is normal. The left ventricular internal cavity size was normal in size. There is no left ventricular hypertrophy. Left ventricular diastolic parameters are consistent with Grade I diastolic dysfunction (impaired relaxation). Indeterminate filling pressures.  Right Ventricle: The right ventricular size is normal. No increase in right ventricular wall thickness. Right ventricular systolic function is normal. There is normal pulmonary artery systolic pressure. The tricuspid regurgitant velocity is 2.30 m/s, and with an assumed right atrial pressure of 3 mmHg, the estimated right ventricular systolic pressure is 24.2 mmHg.  Left Atrium: Left atrial size was mild to moderately  dilated.  Right Atrium: Right atrial size was mildly dilated.  Pericardium: There is no evidence of pericardial effusion.  Mitral Valve: The mitral valve is degenerative in appearance. There is mild thickening of the mitral valve leaflet(s). Mild to moderate mitral annular calcification. Moderate mitral valve regurgitation, with centrally-directed jet.  Tricuspid Valve: The tricuspid valve is normal in structure. Tricuspid valve regurgitation is trivial.  Aortic Valve: The aortic valve is tricuspid. There is mild calcification of the aortic valve. There is mild thickening of the aortic valve. Aortic valve regurgitation is mild to moderate. Aortic regurgitation PHT measures 425 msec. Aortic valve sclerosis/calcification is present, without any evidence of aortic stenosis. Aortic valve mean gradient measures 3.0 mmHg. Aortic valve peak gradient measures 5.4 mmHg. Aortic valve area, by VTI measures 2.77 cm.  Pulmonic Valve: The pulmonic valve was not well visualized. Pulmonic valve regurgitation is not visualized.  Aorta: The aortic root and ascending aorta are structurally normal, with no evidence of dilitation.  Venous: The inferior vena cava was not well visualized.  IAS/Shunts: No atrial level shunt detected by color flow Doppler.  Additional Comments: 3D was performed not requiring image post processing on an independent workstation and was normal.   LEFT VENTRICLE PLAX 2D LVIDd:         4.38 cm         Diastology LVIDs:         2.96 cm         LV e' medial:    5.11 cm/s LV PW:         0.98 cm         LV E/e' medial:  17.8 LV IVS:        1.04 cm         LV e' lateral:   8.05 cm/s LVOT diam:     2.00 cm         LV E/e' lateral: 11.3 LV SV:         86 LV SV Index:   45 LVOT Area:     3.14 cm        3D Volume EF LV 3D EF:    Left ventricul ar ejection fraction by 3D volume is 59 %.  3D Volume EF: 3D EF:        59 % LV EDV:       166 ml LV ESV:       68 ml LV SV:         98 ml  RIGHT VENTRICLE             IVC RV Basal diam:  4.62 cm     IVC diam: 1.40 cm RV Mid diam:    3.88 cm RV S prime:     14.30 cm/s TAPSE (M-mode): 2.2 cm  LEFT ATRIUM           Index        RIGHT ATRIUM           Index LA diam:      3.30 cm 1.73 cm/m   RA Area:     16.30 cm LA Vol (A2C): 72.7 ml 38.02 ml/m  RA Volume:   40.10 ml  20.97 ml/m LA Vol (A4C): 65.9 ml 34.47 ml/m AORTIC VALVE AV Area (Vmax):    2.58 cm AV Area (Vmean):   2.51 cm AV Area (VTI):     2.77 cm AV Vmax:           116.00 cm/s AV Vmean:          80.200 cm/s AV VTI:            0.312 m AV Peak Grad:      5.4 mmHg AV Mean Grad:      3.0 mmHg LVOT Vmax:         95.20 cm/s LVOT Vmean:        64.000 cm/s LVOT VTI:          0.275 m LVOT/AV VTI ratio: 0.88 AI PHT:            425 msec  AORTA Ao Root diam: 3.30 cm Ao Asc diam:  2.80 cm  MITRAL VALVE                           TRICUSPID VALVE MV Area (PHT): 4.15 cm                TR Peak grad:   21.2 mmHg MV Area VTI:   0.96 cm  TR Vmax:        230.00 cm/s MV VTI:        0.90 m MV Decel Time: 183 msec                SHUNTS MR Peak grad:    53.0 mmHg             Systemic VTI:  0.28 m MR Vmax:         364.00 cm/s           Systemic Diam: 2.00 cm MR PISA Nyquist:             -0.3 m/s MR PISA:         2.11 cm MR PISA Radius:  0.58 cm MV E velocity: 90.90 cm/s MV A velocity: 116.00 cm/s MV E/A ratio:  0.78  Mihai Croitoru MD Electronically signed by Luana Rumple MD Signature Date/Time: 09/06/2023/5:12:04 PM    Final      CT SCANS  CT CORONARY MORPH W/CTA COR W/SCORE 09/08/2023  Addendum 09/24/2023  7:17 PM ADDENDUM REPORT: 09/24/2023 19:14  EXAM: OVER-READ INTERPRETATION  CT CHEST  The following report is an over-read performed by radiologist Dr. Violeta Grey of [facility] on 09/24/2023. This over-read does not include interpretation of cardiac or coronary anatomy or pathology. The coronary calcium   score/coronary CTA interpretation by the cardiologist is attached.  COMPARISON:  None.  FINDINGS: Cardiovascular: Atherosclerotic calcifications of the thoracic aorta are noted without aneurysmal dilatation. No dissection is seen.  Mediastinum/Nodes: There are no enlarged lymph nodes within the visualized mediastinum.  Lungs/Pleura: There is no pleural effusion. The visualized lungs appear clear.  Upper abdomen: No significant findings in the visualized upper abdomen.  Musculoskeletal/Chest wall: No chest wall mass or suspicious osseous findings within the visualized chest.  IMPRESSION: No significant extracardiac findings within the visualized chest.   Electronically Signed By: Violeta Grey M.D. On: 09/24/2023 19:14  Narrative CLINICAL DATA:  Chest pain  EXAM: Cardiac/Coronary CTA  TECHNIQUE: A non-contrast, gated CT scan was obtained with axial slices of 3 mm through the heart for calcium  scoring. Calcium  scoring was performed using the Agatston method. A 120 kV prospective, gated, contrast cardiac scan was obtained. Gantry rotation speed was 250 msecs and collimation was 0.6 mm. Two sublingual nitroglycerin  tablets (0.8 mg) were given. The 3D data set was reconstructed in 5% intervals of the 35-75% of the R-R cycle. Diastolic phases were analyzed on a dedicated workstation using MPR, MIP, and VRT modes. The patient received 95 cc of contrast.  FINDINGS: Image quality: Excellent.  Noise artifact is: Limited.  Coronary Arteries:  Normal coronary origin.  Right dominance.  Left main: The left main is a large caliber vessel with a normal take off from the left coronary cusp that trifurcates into a LAD, LCX, and ramus intermedius. There is minimal calcified plaque in the distal LM with associated stenosis of <25%.  Left anterior descending artery: The LAD gives off 2 patent diagonal branches. There is mild calcified plaque in the proximal LAD with associated  stenosis of 25-49%. There is moderate calcified plaque in the mid LAD/proximal D2 with associated stenosis of 50-69%.  Ramus intermedius: There is mild to moderate calcified plaque in the proximal Ramus with associated stenosis of at least 25-49% and may be > 50%.  Left circumflex artery: The LCX is non-dominant and gives off 2 patent obtuse marginal branches. There is mild calcified plaque in the proximal and distal LCx with associated  stenosis of 25-49%. There there is moderate calcified plaque in the mid LCx with associated stenosis of 50-69%.  Right coronary artery: The RCA is dominant with normal take off from the right coronary cusp. There is mild calcified plaque in the proximal and mid RCA with associated stenosis of 25-49%. There is moderate calcified plaque in the mid to distal RCA with associated stenosis of 50-69%. The RCA terminates as a PDA and right posterolateral branch without evidence of plaque or stenosis.  Right Atrium: Right atrial size is within normal limits.  Right Ventricle: The right ventricular cavity is within normal limits.  Left Atrium: Left atrial size is normal in size with no left atrial appendage filling defect.  Left Ventricle: The ventricular cavity size is within normal limits.  Pulmonary arteries: Normal in size.  Pulmonary veins: Normal pulmonary venous drainage with 3 right sided pulmonary veins and 2 on the left.  Pericardium: Normal thickness without significant effusion or calcium  present.  Cardiac valves: The aortic valve is trileaflet without significant calcification. The mitral valve is normal without significant calcification.  Aorta: Normal caliber without significant disease.  Possible small PFO.  Extra-cardiac findings: See attached radiology report for non-cardiac structures.  IMPRESSION: 1. Coronary calcium  score of 871. This was 80th percentile for age-, sex, and race-matched controls.  2. Total plaque volume  962 mm3 which is 67th percentile for age- and sex-matched controls (calcified plaque 215 mm3; non-calcified plaque 747 mm3). TPV is extensive.  3. Normal coronary origin with right dominance.  4. Moderate atherosclerosis: 50-69% mid LCx/mid to distal RCA/mid LAD/Prox D2.  5. Consider symptom-guided anti-ischemic and preventive pharmacotherapy as well as risk factor modification per guideline-directed care.  6. This study has been submitted for FFR analysis.  RECOMMENDATIONS: 1. CAD-RADS 0: No evidence of CAD (0%). Consider non-atherosclerotic causes of chest pain.  2. CAD-RADS 1: Minimal non-obstructive CAD (0-24%). Consider non-atherosclerotic causes of chest pain. Consider preventive therapy and risk factor modification.  3. CAD-RADS 2: Mild non-obstructive CAD (25-49%). Consider non-atherosclerotic causes of chest pain. Consider preventive therapy and risk factor modification.  4. CAD-RADS 3: Moderate stenosis. Consider symptom-guided anti-ischemic pharmacotherapy as well as risk factor modification per guideline directed care. Additional analysis with CT FFR will be submitted.  5. CAD-RADS 4: Severe stenosis. (70-99% or > 50% left main). Cardiac catheterization or CT FFR is recommended. Consider symptom-guided anti-ischemic pharmacotherapy as well as risk factor modification per guideline directed care. Invasive coronary angiography recommended with revascularization per published guideline statements.  6. CAD-RADS 5: Total coronary occlusion (100%). Consider cardiac catheterization or viability assessment. Consider symptom-guided anti-ischemic pharmacotherapy as well as risk factor modification per guideline directed care.  7. CAD-RADS N: Non-diagnostic study. Obstructive CAD can't be excluded. Alternative evaluation is recommended.  Gaylyn Keas, MD  Electronically Signed: By: Gaylyn Keas M.D. On: 09/09/2023 22:22      ______________________________________________________________________________________________     Recent Labs: 06/25/2023: BNP 160.3 07/15/2023: ALT 29; Hemoglobin 12.9; Platelets 324; TSH 1.890 09/07/2023: BUN 20; Creatinine, Ser 0.71; Potassium 4.9; Sodium 133  Recent Lipid Panel No results found for: CHOL, TRIG, HDL, CHOLHDL, VLDL, LDLCALC, LDLDIRECT  History of Present Illness    73 year old male with the above past medical history including CAD, LBBB, mitral valve regurgitation, aortic valve regurgitation, hypertension, hyperlipidemia, type 1 diabetes with diabetic neuropathy, BPH, normal pressure hydrocephalus and chronic lymphocytic leukemia.   He was referred to cardiology in the setting of abnormal EKG, cardiac murmur, and dyspnea on exertion.  He established with Dr.  Crenshaw and was last seen in the office on 08/23/2023.  EKG showed left bundle branch block.  He noted progressive dyspnea on exertion.  Echocardiogram in 08/2023 in the setting of cardiac murmur, dyspnea on exertion showed EF 60 to 65%, normal LV function, no RWMA, G1 DD, normal RV systolic function, mild to moderate left atrial dilation, mild right atrial dilation, moderate mitral valve regurgitation, mild to moderate aortic valve regurgitation.  Coronary CT angiogram revealed coronary calcium  score of 871 (80th percentile), extensive TPV, possible flow-limiting lesions in the mid LAD FFR 0.65, and distal RCA, FFR 0.74. He was advised to start aspirin  81 mg daily.  Possible cardiac catheterization was recommended based on symptoms.  He was last seen in the office on 09/27/2023 and was stable from a cardiac standpoint.  He noted intermittent fleeting chest discomfort, mild intermittent shortness of breath with activity, mild dependent nonpitting bilateral lower extremity edema.  He deferred cath.  He contacted our office on 09/28/2023 and expressed interest in pursuing cardiac catheterization.   He presents today  for follow-up.  Since his last visit he has been stable from a cardiac standpoint.  He denies any change in symptoms, however, he has decided that he would like to proceed with cardiac catheterization for further evaluation of coronary disease.   BP slightly elevated in office today, he has not taken his medicine this morning.  Home Medications    Current Outpatient Medications  Medication Sig Dispense Refill   Acetylcarnitine HCl (ACETYL L-CARNITINE PO) Take 500 mg by mouth in the morning.     acidophilus (RISAQUAD) CAPS capsule Take 1 capsule by mouth in the morning.     Alpha-Lipoic Acid 300 MG CAPS Take 300 mg by mouth 2 (two) times daily.     amLODipine  (NORVASC ) 10 MG tablet Take 1 tablet (10 mg total) by mouth in the morning. 30 tablet 0   Ascorbic Acid (VITAMIN C PO) Take 1 tablet by mouth in the morning.     aspirin  EC 81 MG tablet Take 1 tablet (81 mg total) by mouth daily. Swallow whole.     ASTRAGALUS PO Take 470 mg by mouth every evening.     atorvastatin  (LIPITOR ) 80 MG tablet Take 1 tablet (80 mg total) by mouth every evening. 30 tablet 0   carvedilol  (COREG ) 3.125 MG tablet Take 1 tablet (3.125 mg total) by mouth 2 (two) times daily with a meal. 30 tablet 0   Cholecalciferol (VITAMIN D) 50 MCG (2000 UT) CAPS Take 2,000 Units by mouth in the morning.     clotrimazole -betamethasone  (LOTRISONE ) cream Apply 1 Application topically daily. 30 g 0   Coenzyme Q10 (COQ-10) 100 MG capsule Take 100 mg by mouth at bedtime.     CONTOUR NEXT TEST test strip 2 (two) times daily.     GRAPE SEED EXTRACT PO Take 100 mg by mouth every evening.     L-METHYLFOLATE PO Take 1,000 mcg by mouth in the morning.     levocetirizine (XYZAL ) 5 MG tablet TAKE ONE TABLET BY MOUTH EVERY EVENING 90 tablet 1   meclizine  (ANTIVERT ) 25 MG tablet Take 1 tablet (25 mg total) by mouth 3 (three) times daily as needed for dizziness. 30 tablet 0   methotrexate (RHEUMATREX) 2.5 MG tablet Take 20 mg by mouth every  Thursday. In the morning.     Multiple Vitamin (MULTIVITAMIN WITH MINERALS) TABS tablet Take 1 tablet by mouth every evening.     nitroGLYCERIN  (NITROSTAT ) 0.4 MG SL tablet  Place 1 tablet (0.4 mg total) under the tongue every 5 (five) minutes as needed for chest pain. 25 tablet 3   NOVOLOG  100 UNIT/ML injection INJECT UP TO 100 UNITS DAILY MAXIMUM DOSE WITH INSULIN  PUMP DAILY  1   Omega-3 Fatty Acids (OMEGA III EPA+DHA PO) Take 1 capsule by mouth in the morning.     ondansetron  (ZOFRAN  ODT) 8 MG disintegrating tablet 8mg  ODT q6 hours prn nausea 8 tablet 0   Polyvinyl Alcohol-Povidone (REFRESH OP) Apply 1-2 drops to eye every 3 (three) hours as needed (dryness).     solifenacin  (VESICARE ) 10 MG tablet Take 1 tablet (10 mg total) by mouth daily. 30 tablet 11   telmisartan  (MICARDIS ) 40 MG tablet Take 1 tablet (40 mg total) by mouth in the morning. 30 tablet 0   Vitamins-Lipotropics (B-100 PO) Take 1 tablet by mouth in the morning.     No current facility-administered medications for this visit.   Facility-Administered Medications Ordered in Other Visits  Medication Dose Route Frequency Provider Last Rate Last Admin   water  for irrigation, sterile for irrigation SOLN    PRN Marco Severs, MD   500 mL at 12/18/22 0810     Review of Systems   He denies palpitations, pnd, orthopnea, n, v, dizziness, syncope, edema, weight gain, or early satiety. All other systems reviewed and are otherwise negative except as noted above.   Physical Exam    VS:  BP (!) 148/72   Ht 5' 8 (1.727 m)   Wt 168 lb (76.2 kg)   BMI 25.54 kg/m   GEN: Well nourished, well developed, in no acute distress. HEENT: normal. Neck: Supple, no JVD, carotid bruits, or masses. Cardiac: RRR, no murmurs, rubs, or gallops. No clubbing, cyanosis, edema.  Radials/DP/PT 2+ and equal bilaterally.  Respiratory:  Respirations regular and unlabored, clear to auscultation bilaterally. GI: Soft, nontender, nondistended, BS + x  4. MS: no deformity or atrophy. Skin: warm and dry, no rash. Neuro:  Strength and sensation are intact. Psych: Normal affect.  Accessory Clinical Findings    ECG personally reviewed by me today - EKG Interpretation Date/Time:  Wednesday September 29 2023 11:01:43 EDT Ventricular Rate:  56 PR Interval:  178 QRS Duration:  162 QT Interval:  462 QTC Calculation: 445 R Axis:   -26  Text Interpretation: Sinus bradycardia Left bundle branch block When compared with ECG of 23-Aug-2023 09:32, QRS axis Shifted left T wave inversion now evident in Lateral leads Confirmed by Marlana Silvan ([id number]) on 09/29/2023 11:23:51 AM  - no acute changes.   Lab Results  Component Value Date   WBC 16.5 (H) 07/15/2023   HGB 12.9 (L) 07/15/2023   HCT 39.7 07/15/2023   MCV 100 (H) 07/15/2023   PLT 324 07/15/2023   Lab Results  Component Value Date   CREATININE 0.71 (L) 09/07/2023   BUN 20 09/07/2023   NA 133 (L) 09/07/2023   K 4.9 09/07/2023   CL 97 09/07/2023   CO2 28 09/07/2023   Lab Results  Component Value Date   ALT 29 07/15/2023   AST 37 07/15/2023   ALKPHOS 78 07/15/2023   BILITOT 0.3 07/15/2023   No results found for: CHOL, HDL, LDLCALC, LDLDIRECT, TRIG, CHOLHDL  Lab Results  Component Value Date   HGBA1C 6.0 (H) 12/11/2022    Assessment & Plan    1. CAD:  Coronary CT angiogram revealed coronary calcium  score of 871 (8th percentile), extensive TPV, possible flow-limiting lesions in the mid  LAD FFR 0.65, and distal RCA, FFR 0.74.  He was advised to start aspirin  81 mg daily.  Possible cardiac catheterization was recommended based on symptoms. He reports mild intermittent fleeting chest discomfort, mild intermittent shortness of breath with activity. Overall, his symptoms have been mild and stable.  At our prior visit we had a detailed discussion about his test results, treatment options, including, further diagnostic testing with cardiac catheterization.  He returns today and is  interested in proceeding with cardiac catheterization.  Discussed with Dr. Addie Holstein, DOD, we will go ahead and schedule left heart cath.  He had an appointment with his endocrinologist today and had a CMET drawn, will request copy of labs.  Will LHC is scheduled for 10/05/2023 with Dr. [place]. He is on an insulin  pump. Continue aspirin , carvedilol , telmisartan , amlodipine , Lipitor .  Informed Consent   Shared Decision Making/Informed Consent The risks [stroke (1 in 1000), death (1 in 1000), kidney failure [usually temporary] (1 in 500), bleeding (1 in 200), allergic reaction [possibly serious] (1 in 200)], benefits (diagnostic support and management of coronary artery disease) and alternatives of a cardiac catheterization were discussed in detail with Mr. Messimer and he is willing to proceed.       2. LBBB: Stable, now with evidence of CAD as above.   3. Mitral valve regurgitation/aortic valve regurgitation: Echocardiogram in 08/2023 in the setting of cardiac murmur, dyspnea on exertion showed EF 60 to 65%, normal LV function, no RWMA, G1 DD, normal RV systolic function, mild to moderate left atrial dilation, mild right atrial dilation, moderate mitral valve regurgitation, mild to moderate aortic valve regurgitation. He has mild dependent nonpitting bilateral lower extremity edema. Euvolemic and well compensated on exam.  Continue to monitor for progressive symptoms.  Consider repeat echocardiogram as clinically indicated.   4. Hypertension: BP is mildly elevated in office today, generally well-controlled.  He has not yet taken his medication this morning.  Continue to monitor BP and report BP consistently 130/80.  For now, continue current antihypertensive regimen.    5. Hyperlipidemia: LDL was 59 in 01/2023. Continue Lipitor .    6. Disposition: Follow-up 2 weeks post cath. Follow-up as scheduled with Dr. Audery Blazing in 11/2023.  HYPERTENSION CONTROL Vitals:   09/29/23 1052 09/29/23 1157  BP: (!) 170/70  (!) 148/72    The patient's blood pressure is elevated above target today.  In order to address the patient's elevated BP: Blood pressure will be monitored at home to determine if medication changes need to be made.; Follow up with general cardiology has been recommended.      Jude Norton, NP 09/29/2023, 11:57 AM

## 2023-09-29 NOTE — Progress Notes (Signed)
 Office Visit    Patient Name: Christopher Conner Date of Encounter: 09/29/2023  Primary Care Provider:  Annella Kief, NP Primary Cardiologist:  Alexandria Angel, MD  Chief Complaint    73 year old male with a history of CAD, LBBB, mitral valve regurgitation, aortic valve regurgitation, hypertension, hyperlipidemia, type 1 diabetes with diabetic neuropathy, BPH, normal pressure hydrocephalus and chronic lymphocytic leukemia who presents for follow-up related to CAD.   Past Medical History    Past Medical History:  Diagnosis Date   Anemia 07/15/2023   Arthritis    Bilateral foot-drop    after back surgery   BPH (benign prostatic hyperplasia)    Cataract    Chronic lymphocytic leukemia (HCC) early 2018   no current treatment   CLL (chronic lymphocytic leukemia) (HCC)    DM type 1 (diabetes mellitus, type 1) (HCC)    type 1   Goals of care, counseling/discussion 07/18/2023   High cholesterol    Hypertension    Murmur, cardiac 07/15/2023   Neuromuscular disorder (HCC)    diabetic neuropathy   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   Scattered rhonchi of right lung 11/22/2022   Past Surgical History:  Procedure Laterality Date   APPENDECTOMY  early 20's   BACK SURGERY     BRAIN SURGERY  10/30/04   brain shunt, [facility]   CATARACT EXTRACTION Bilateral    left eye prostheisi shell   CYST REMOVAL NECK  yrs ago   CYSTOSCOPY [name] 04/22/2023   Procedure: CYSTOSCOPY;  Surgeon: Marco Severs, MD;  Location: [facility];  Service: Urology;  Laterality: [name];   CYSTOSCOPY WITH INJECTION [name] 12/17/2022   Procedure: CYSTOSCOPY WITH INJECTION-Botox ;  Surgeon: Marco Severs, MD;  Location: [facility];  Service: Urology;  Laterality: [name];   CYSTOSCOPY WITH INSERTION OF UROLIFT  april/march 2020   done in office   CYSTOSCOPY WITH INSERTION OF UROLIFT [name] 10/12/2019   Procedure: CYSTOSCOPY WITH INSERTION OF UROLIFT;  Surgeon: Marco Severs, MD;  Location:  [facility];  Service: Urology;  Laterality: [name];   EYE SURGERY     LUMBAR FUSION  1998 and 2000   l5 to s1 1998, then l4 and l5 in 2000   SPINE SURGERY     H. Elsner, L4-5 & L5-S1   TRANSURETHRAL RESECTION OF PROSTATE [name] 04/22/2023   Procedure: TRANSURETHRAL RESECTION OF THE PROSTATE (TURP);  Surgeon: Marco Severs, MD;  Location: [facility];  Service: Urology;  Laterality: [name];   VENTRICULOPERITONEAL SHUNT  2006    Allergies  Allergies  Allergen Reactions   Lisinopril  Cough   Latex Itching and Rash     Labs/Other Studies Reviewed    The following studies were reviewed today:  Cardiac Studies & Procedures   ______________________________________________________________________________________________     ECHOCARDIOGRAM  ECHOCARDIOGRAM COMPLETE 09/06/2023  Narrative ECHOCARDIOGRAM REPORT    Patient Name:   Christopher Conner Date of Exam: 09/06/2023 Medical Rec #:  [id number]      Height:       68.0 in Accession #:    [id number]     Weight:       171.0 lb Date of Birth:  04-14-1951     BSA:          1.912 m Patient Age:    72 years       BP:           152/75 mmHg Patient Gender: M  HR:           57 bpm. Exam Location:  Outpatient  Procedure: 2D Echo, Cardiac Doppler and Color Doppler (Both Spectral and Color Flow Doppler were utilized during procedure).  Indications:    Murmur, cardiac [R01.1 (ICD-10-CM)]; Peripheral edema [R60.0 (ICD-10-CM)]  History:        Patient has prior history of Echocardiogram examinations, most recent 04/10/2014. Risk Factors:Hypertension, Diabetes and Non-Smoker.  Sonographer:    Delford Felling MHA, RDMS, RVT, RDCS Referring Phys: [phone] SARA E EARLY   Sonographer Comments: Suboptimal subcostal window. Image acquisition challenging due to respiratory motion. IMPRESSIONS   1. Left ventricular ejection fraction, by estimation, is 60 to 65%. Left ventricular ejection fraction by 3D volume is 59 %. The  left ventricle has normal function. The left ventricle has no regional wall motion abnormalities. Left ventricular diastolic parameters are consistent with Grade I diastolic dysfunction (impaired relaxation). The global longitudinal strain is normal. 2. Right ventricular systolic function is normal. The right ventricular size is normal. There is normal pulmonary artery systolic pressure. The estimated right ventricular systolic pressure is 24.2 mmHg. 3. Left atrial size was mild to moderately dilated. 4. Right atrial size was mildly dilated. 5. The mitral valve is degenerative. Moderate mitral valve regurgitation. 6. The aortic valve is tricuspid. There is mild calcification of the aortic valve. There is mild thickening of the aortic valve. Aortic valve regurgitation is mild to moderate. Aortic valve sclerosis/calcification is present, without any evidence of aortic stenosis. Aortic regurgitation PHT measures 425 msec.  FINDINGS Left Ventricle: Left ventricular ejection fraction, by estimation, is 60 to 65%. Left ventricular ejection fraction by 3D volume is 59 %. The left ventricle has normal function. The left ventricle has no regional wall motion abnormalities. Strain was performed and the global longitudinal strain is normal. The left ventricular internal cavity size was normal in size. There is no left ventricular hypertrophy. Left ventricular diastolic parameters are consistent with Grade I diastolic dysfunction (impaired relaxation). Indeterminate filling pressures.  Right Ventricle: The right ventricular size is normal. No increase in right ventricular wall thickness. Right ventricular systolic function is normal. There is normal pulmonary artery systolic pressure. The tricuspid regurgitant velocity is 2.30 m/s, and with an assumed right atrial pressure of 3 mmHg, the estimated right ventricular systolic pressure is 24.2 mmHg.  Left Atrium: Left atrial size was mild to moderately  dilated.  Right Atrium: Right atrial size was mildly dilated.  Pericardium: There is no evidence of pericardial effusion.  Mitral Valve: The mitral valve is degenerative in appearance. There is mild thickening of the mitral valve leaflet(s). Mild to moderate mitral annular calcification. Moderate mitral valve regurgitation, with centrally-directed jet.  Tricuspid Valve: The tricuspid valve is normal in structure. Tricuspid valve regurgitation is trivial.  Aortic Valve: The aortic valve is tricuspid. There is mild calcification of the aortic valve. There is mild thickening of the aortic valve. Aortic valve regurgitation is mild to moderate. Aortic regurgitation PHT measures 425 msec. Aortic valve sclerosis/calcification is present, without any evidence of aortic stenosis. Aortic valve mean gradient measures 3.0 mmHg. Aortic valve peak gradient measures 5.4 mmHg. Aortic valve area, by VTI measures 2.77 cm.  Pulmonic Valve: The pulmonic valve was not well visualized. Pulmonic valve regurgitation is not visualized.  Aorta: The aortic root and ascending aorta are structurally normal, with no evidence of dilitation.  Venous: The inferior vena cava was not well visualized.  IAS/Shunts: No atrial level shunt detected by color flow Doppler.  Additional Comments: 3D was performed not requiring image post processing on an independent workstation and was normal.   LEFT VENTRICLE PLAX 2D LVIDd:         4.38 cm         Diastology LVIDs:         2.96 cm         LV e' medial:    5.11 cm/s LV PW:         0.98 cm         LV E/e' medial:  17.8 LV IVS:        1.04 cm         LV e' lateral:   8.05 cm/s LVOT diam:     2.00 cm         LV E/e' lateral: 11.3 LV SV:         86 LV SV Index:   45 LVOT Area:     3.14 cm        3D Volume EF LV 3D EF:    Left ventricul ar ejection fraction by 3D volume is 59 %.  3D Volume EF: 3D EF:        59 % LV EDV:       166 ml LV ESV:       68 ml LV SV:         98 ml  RIGHT VENTRICLE             IVC RV Basal diam:  4.62 cm     IVC diam: 1.40 cm RV Mid diam:    3.88 cm RV S prime:     14.30 cm/s TAPSE (M-mode): 2.2 cm  LEFT ATRIUM           Index        RIGHT ATRIUM           Index LA diam:      3.30 cm 1.73 cm/m   RA Area:     16.30 cm LA Vol (A2C): 72.7 ml 38.02 ml/m  RA Volume:   40.10 ml  20.97 ml/m LA Vol (A4C): 65.9 ml 34.47 ml/m AORTIC VALVE AV Area (Vmax):    2.58 cm AV Area (Vmean):   2.51 cm AV Area (VTI):     2.77 cm AV Vmax:           116.00 cm/s AV Vmean:          80.200 cm/s AV VTI:            0.312 m AV Peak Grad:      5.4 mmHg AV Mean Grad:      3.0 mmHg LVOT Vmax:         95.20 cm/s LVOT Vmean:        64.000 cm/s LVOT VTI:          0.275 m LVOT/AV VTI ratio: 0.88 AI PHT:            425 msec  AORTA Ao Root diam: 3.30 cm Ao Asc diam:  2.80 cm  MITRAL VALVE                           TRICUSPID VALVE MV Area (PHT): 4.15 cm                TR Peak grad:   21.2 mmHg MV Area VTI:   0.96 cm  TR Vmax:        230.00 cm/s MV VTI:        0.90 m MV Decel Time: 183 msec                SHUNTS MR Peak grad:    53.0 mmHg             Systemic VTI:  0.28 m MR Vmax:         364.00 cm/s           Systemic Diam: 2.00 cm MR PISA Nyquist:             -0.3 m/s MR PISA:         2.11 cm MR PISA Radius:  0.58 cm MV E velocity: 90.90 cm/s MV A velocity: 116.00 cm/s MV E/A ratio:  0.78  Mihai Croitoru MD Electronically signed by Luana Rumple MD Signature Date/Time: 09/06/2023/5:12:04 PM    Final      CT SCANS  CT CORONARY MORPH W/CTA COR W/SCORE 09/08/2023  Addendum 09/24/2023  7:17 PM ADDENDUM REPORT: 09/24/2023 19:14  EXAM: OVER-READ INTERPRETATION  CT CHEST  The following report is an over-read performed by radiologist Dr. Violeta Grey of [facility] on 09/24/2023. This over-read does not include interpretation of cardiac or coronary anatomy or pathology. The coronary calcium   score/coronary CTA interpretation by the cardiologist is attached.  COMPARISON:  None.  FINDINGS: Cardiovascular: Atherosclerotic calcifications of the thoracic aorta are noted without aneurysmal dilatation. No dissection is seen.  Mediastinum/Nodes: There are no enlarged lymph nodes within the visualized mediastinum.  Lungs/Pleura: There is no pleural effusion. The visualized lungs appear clear.  Upper abdomen: No significant findings in the visualized upper abdomen.  Musculoskeletal/Chest wall: No chest wall mass or suspicious osseous findings within the visualized chest.  IMPRESSION: No significant extracardiac findings within the visualized chest.   Electronically Signed By: Violeta Grey M.D. On: 09/24/2023 19:14  Narrative CLINICAL DATA:  Chest pain  EXAM: Cardiac/Coronary CTA  TECHNIQUE: A non-contrast, gated CT scan was obtained with axial slices of 3 mm through the heart for calcium  scoring. Calcium  scoring was performed using the Agatston method. A 120 kV prospective, gated, contrast cardiac scan was obtained. Gantry rotation speed was 250 msecs and collimation was 0.6 mm. Two sublingual nitroglycerin  tablets (0.8 mg) were given. The 3D data set was reconstructed in 5% intervals of the 35-75% of the R-R cycle. Diastolic phases were analyzed on a dedicated workstation using MPR, MIP, and VRT modes. The patient received 95 cc of contrast.  FINDINGS: Image quality: Excellent.  Noise artifact is: Limited.  Coronary Arteries:  Normal coronary origin.  Right dominance.  Left main: The left main is a large caliber vessel with a normal take off from the left coronary cusp that trifurcates into a LAD, LCX, and ramus intermedius. There is minimal calcified plaque in the distal LM with associated stenosis of <25%.  Left anterior descending artery: The LAD gives off 2 patent diagonal branches. There is mild calcified plaque in the proximal LAD with associated  stenosis of 25-49%. There is moderate calcified plaque in the mid LAD/proximal D2 with associated stenosis of 50-69%.  Ramus intermedius: There is mild to moderate calcified plaque in the proximal Ramus with associated stenosis of at least 25-49% and may be > 50%.  Left circumflex artery: The LCX is non-dominant and gives off 2 patent obtuse marginal branches. There is mild calcified plaque in the proximal and distal LCx with associated  stenosis of 25-49%. There there is moderate calcified plaque in the mid LCx with associated stenosis of 50-69%.  Right coronary artery: The RCA is dominant with normal take off from the right coronary cusp. There is mild calcified plaque in the proximal and mid RCA with associated stenosis of 25-49%. There is moderate calcified plaque in the mid to distal RCA with associated stenosis of 50-69%. The RCA terminates as a PDA and right posterolateral branch without evidence of plaque or stenosis.  Right Atrium: Right atrial size is within normal limits.  Right Ventricle: The right ventricular cavity is within normal limits.  Left Atrium: Left atrial size is normal in size with no left atrial appendage filling defect.  Left Ventricle: The ventricular cavity size is within normal limits.  Pulmonary arteries: Normal in size.  Pulmonary veins: Normal pulmonary venous drainage with 3 right sided pulmonary veins and 2 on the left.  Pericardium: Normal thickness without significant effusion or calcium  present.  Cardiac valves: The aortic valve is trileaflet without significant calcification. The mitral valve is normal without significant calcification.  Aorta: Normal caliber without significant disease.  Possible small PFO.  Extra-cardiac findings: See attached radiology report for non-cardiac structures.  IMPRESSION: 1. Coronary calcium  score of 871. This was 80th percentile for age-, sex, and race-matched controls.  2. Total plaque volume  962 mm3 which is 67th percentile for age- and sex-matched controls (calcified plaque 215 mm3; non-calcified plaque 747 mm3). TPV is extensive.  3. Normal coronary origin with right dominance.  4. Moderate atherosclerosis: 50-69% mid LCx/mid to distal RCA/mid LAD/Prox D2.  5. Consider symptom-guided anti-ischemic and preventive pharmacotherapy as well as risk factor modification per guideline-directed care.  6. This study has been submitted for FFR analysis.  RECOMMENDATIONS: 1. CAD-RADS 0: No evidence of CAD (0%). Consider non-atherosclerotic causes of chest pain.  2. CAD-RADS 1: Minimal non-obstructive CAD (0-24%). Consider non-atherosclerotic causes of chest pain. Consider preventive therapy and risk factor modification.  3. CAD-RADS 2: Mild non-obstructive CAD (25-49%). Consider non-atherosclerotic causes of chest pain. Consider preventive therapy and risk factor modification.  4. CAD-RADS 3: Moderate stenosis. Consider symptom-guided anti-ischemic pharmacotherapy as well as risk factor modification per guideline directed care. Additional analysis with CT FFR will be submitted.  5. CAD-RADS 4: Severe stenosis. (70-99% or > 50% left main). Cardiac catheterization or CT FFR is recommended. Consider symptom-guided anti-ischemic pharmacotherapy as well as risk factor modification per guideline directed care. Invasive coronary angiography recommended with revascularization per published guideline statements.  6. CAD-RADS 5: Total coronary occlusion (100%). Consider cardiac catheterization or viability assessment. Consider symptom-guided anti-ischemic pharmacotherapy as well as risk factor modification per guideline directed care.  7. CAD-RADS N: Non-diagnostic study. Obstructive CAD can't be excluded. Alternative evaluation is recommended.  Gaylyn Keas, MD  Electronically Signed: By: Gaylyn Keas M.D. On: 09/09/2023 22:22      ______________________________________________________________________________________________     Recent Labs: 06/25/2023: BNP 160.3 07/15/2023: ALT 29; Hemoglobin 12.9; Platelets 324; TSH 1.890 09/07/2023: BUN 20; Creatinine, Ser 0.71; Potassium 4.9; Sodium 133  Recent Lipid Panel No results found for: CHOL, TRIG, HDL, CHOLHDL, VLDL, LDLCALC, LDLDIRECT  History of Present Illness    73 year old male with the above past medical history including CAD, LBBB, mitral valve regurgitation, aortic valve regurgitation, hypertension, hyperlipidemia, type 1 diabetes with diabetic neuropathy, BPH, normal pressure hydrocephalus and chronic lymphocytic leukemia.   He was referred to cardiology in the setting of abnormal EKG, cardiac murmur, and dyspnea on exertion.  He established with Dr.  Crenshaw and was last seen in the office on 08/23/2023.  EKG showed left bundle branch block.  He noted progressive dyspnea on exertion.  Echocardiogram in 08/2023 in the setting of cardiac murmur, dyspnea on exertion showed EF 60 to 65%, normal LV function, no RWMA, G1 DD, normal RV systolic function, mild to moderate left atrial dilation, mild right atrial dilation, moderate mitral valve regurgitation, mild to moderate aortic valve regurgitation.  Coronary CT angiogram revealed coronary calcium  score of 871 (80th percentile), extensive TPV, possible flow-limiting lesions in the mid LAD FFR 0.65, and distal RCA, FFR 0.74. He was advised to start aspirin  81 mg daily.  Possible cardiac catheterization was recommended based on symptoms.  He was last seen in the office on 09/27/2023 and was stable from a cardiac standpoint.  He noted intermittent fleeting chest discomfort, mild intermittent shortness of breath with activity, mild dependent nonpitting bilateral lower extremity edema.  He deferred cath.  He contacted our office on 09/28/2023 and expressed interest in pursuing cardiac catheterization.   He presents today  for follow-up.  Since his last visit he has been stable from a cardiac standpoint.  He denies any change in symptoms, however, he has decided that he would like to proceed with cardiac catheterization for further evaluation of coronary disease.   BP slightly elevated in office today, he has not taken his medicine this morning.  Home Medications    Current Outpatient Medications  Medication Sig Dispense Refill   Acetylcarnitine HCl (ACETYL L-CARNITINE PO) Take 500 mg by mouth in the morning.     acidophilus (RISAQUAD) CAPS capsule Take 1 capsule by mouth in the morning.     Alpha-Lipoic Acid 300 MG CAPS Take 300 mg by mouth 2 (two) times daily.     amLODipine  (NORVASC ) 10 MG tablet Take 1 tablet (10 mg total) by mouth in the morning. 30 tablet 0   Ascorbic Acid (VITAMIN C PO) Take 1 tablet by mouth in the morning.     aspirin  EC 81 MG tablet Take 1 tablet (81 mg total) by mouth daily. Swallow whole.     ASTRAGALUS PO Take 470 mg by mouth every evening.     atorvastatin  (LIPITOR ) 80 MG tablet Take 1 tablet (80 mg total) by mouth every evening. 30 tablet 0   carvedilol  (COREG ) 3.125 MG tablet Take 1 tablet (3.125 mg total) by mouth 2 (two) times daily with a meal. 30 tablet 0   Cholecalciferol (VITAMIN D) 50 MCG (2000 UT) CAPS Take 2,000 Units by mouth in the morning.     clotrimazole -betamethasone  (LOTRISONE ) cream Apply 1 Application topically daily. 30 g 0   Coenzyme Q10 (COQ-10) 100 MG capsule Take 100 mg by mouth at bedtime.     CONTOUR NEXT TEST test strip 2 (two) times daily.     GRAPE SEED EXTRACT PO Take 100 mg by mouth every evening.     L-METHYLFOLATE PO Take 1,000 mcg by mouth in the morning.     levocetirizine (XYZAL ) 5 MG tablet TAKE ONE TABLET BY MOUTH EVERY EVENING 90 tablet 1   meclizine  (ANTIVERT ) 25 MG tablet Take 1 tablet (25 mg total) by mouth 3 (three) times daily as needed for dizziness. 30 tablet 0   methotrexate (RHEUMATREX) 2.5 MG tablet Take 20 mg by mouth every  Thursday. In the morning.     Multiple Vitamin (MULTIVITAMIN WITH MINERALS) TABS tablet Take 1 tablet by mouth every evening.     nitroGLYCERIN  (NITROSTAT ) 0.4 MG SL tablet  Place 1 tablet (0.4 mg total) under the tongue every 5 (five) minutes as needed for chest pain. 25 tablet 3   NOVOLOG  100 UNIT/ML injection INJECT UP TO 100 UNITS DAILY MAXIMUM DOSE WITH INSULIN  PUMP DAILY  1   Omega-3 Fatty Acids (OMEGA III EPA+DHA PO) Take 1 capsule by mouth in the morning.     ondansetron  (ZOFRAN  ODT) 8 MG disintegrating tablet 8mg  ODT q6 hours prn nausea 8 tablet 0   Polyvinyl Alcohol-Povidone (REFRESH OP) Apply 1-2 drops to eye every 3 (three) hours as needed (dryness).     solifenacin  (VESICARE ) 10 MG tablet Take 1 tablet (10 mg total) by mouth daily. 30 tablet 11   telmisartan  (MICARDIS ) 40 MG tablet Take 1 tablet (40 mg total) by mouth in the morning. 30 tablet 0   Vitamins-Lipotropics (B-100 PO) Take 1 tablet by mouth in the morning.     No current facility-administered medications for this visit.   Facility-Administered Medications Ordered in Other Visits  Medication Dose Route Frequency Provider Last Rate Last Admin   water  for irrigation, sterile for irrigation SOLN    PRN Marco Severs, MD   500 mL at 12/18/22 0810     Review of Systems   He denies palpitations, pnd, orthopnea, n, v, dizziness, syncope, edema, weight gain, or early satiety. All other systems reviewed and are otherwise negative except as noted above.   Physical Exam    VS:  BP (!) 148/72   Ht 5' 8 (1.727 m)   Wt 168 lb (76.2 kg)   BMI 25.54 kg/m   GEN: Well nourished, well developed, in no acute distress. HEENT: normal. Neck: Supple, no JVD, carotid bruits, or masses. Cardiac: RRR, no murmurs, rubs, or gallops. No clubbing, cyanosis, edema.  Radials/DP/PT 2+ and equal bilaterally.  Respiratory:  Respirations regular and unlabored, clear to auscultation bilaterally. GI: Soft, nontender, nondistended, BS + x  4. MS: no deformity or atrophy. Skin: warm and dry, no rash. Neuro:  Strength and sensation are intact. Psych: Normal affect.  Accessory Clinical Findings    ECG personally reviewed by me today - EKG Interpretation Date/Time:  Wednesday September 29 2023 11:01:43 EDT Ventricular Rate:  56 PR Interval:  178 QRS Duration:  162 QT Interval:  462 QTC Calculation: 445 R Axis:   -26  Text Interpretation: Sinus bradycardia Left bundle branch block When compared with ECG of 23-Aug-2023 09:32, QRS axis Shifted left T wave inversion now evident in Lateral leads Confirmed by Marlana Silvan ([id number]) on 09/29/2023 11:23:51 AM  - no acute changes.   Lab Results  Component Value Date   WBC 16.5 (H) 07/15/2023   HGB 12.9 (L) 07/15/2023   HCT 39.7 07/15/2023   MCV 100 (H) 07/15/2023   PLT 324 07/15/2023   Lab Results  Component Value Date   CREATININE 0.71 (L) 09/07/2023   BUN 20 09/07/2023   NA 133 (L) 09/07/2023   K 4.9 09/07/2023   CL 97 09/07/2023   CO2 28 09/07/2023   Lab Results  Component Value Date   ALT 29 07/15/2023   AST 37 07/15/2023   ALKPHOS 78 07/15/2023   BILITOT 0.3 07/15/2023   No results found for: CHOL, HDL, LDLCALC, LDLDIRECT, TRIG, CHOLHDL  Lab Results  Component Value Date   HGBA1C 6.0 (H) 12/11/2022    Assessment & Plan    1. CAD:  Coronary CT angiogram revealed coronary calcium  score of 871 (8th percentile), extensive TPV, possible flow-limiting lesions in the mid  LAD FFR 0.65, and distal RCA, FFR 0.74.  He was advised to start aspirin  81 mg daily.  Possible cardiac catheterization was recommended based on symptoms. He reports mild intermittent fleeting chest discomfort, mild intermittent shortness of breath with activity. Overall, his symptoms have been mild and stable.  At our prior visit we had a detailed discussion about his test results, treatment options, including, further diagnostic testing with cardiac catheterization.  He returns today and is  interested in proceeding with cardiac catheterization.  Discussed with Dr. Addie Holstein, DOD, we will go ahead and schedule left heart cath.  He had an appointment with his endocrinologist today and had a CMET drawn, will request copy of labs.  Will LHC is scheduled for 10/05/2023 with Dr. [place]. He is on an insulin  pump. Continue aspirin , carvedilol , telmisartan , amlodipine , Lipitor .  Informed Consent   Shared Decision Making/Informed Consent The risks [stroke (1 in 1000), death (1 in 1000), kidney failure [usually temporary] (1 in 500), bleeding (1 in 200), allergic reaction [possibly serious] (1 in 200)], benefits (diagnostic support and management of coronary artery disease) and alternatives of a cardiac catheterization were discussed in detail with Mr. Messimer and he is willing to proceed.       2. LBBB: Stable, now with evidence of CAD as above.   3. Mitral valve regurgitation/aortic valve regurgitation: Echocardiogram in 08/2023 in the setting of cardiac murmur, dyspnea on exertion showed EF 60 to 65%, normal LV function, no RWMA, G1 DD, normal RV systolic function, mild to moderate left atrial dilation, mild right atrial dilation, moderate mitral valve regurgitation, mild to moderate aortic valve regurgitation. He has mild dependent nonpitting bilateral lower extremity edema. Euvolemic and well compensated on exam.  Continue to monitor for progressive symptoms.  Consider repeat echocardiogram as clinically indicated.   4. Hypertension: BP is mildly elevated in office today, generally well-controlled.  He has not yet taken his medication this morning.  Continue to monitor BP and report BP consistently 130/80.  For now, continue current antihypertensive regimen.    5. Hyperlipidemia: LDL was 59 in 01/2023. Continue Lipitor .    6. Disposition: Follow-up 2 weeks post cath. Follow-up as scheduled with Dr. Audery Blazing in 11/2023.  HYPERTENSION CONTROL Vitals:   09/29/23 1052 09/29/23 1157  BP: (!) 170/70  (!) 148/72    The patient's blood pressure is elevated above target today.  In order to address the patient's elevated BP: Blood pressure will be monitored at home to determine if medication changes need to be made.; Follow up with general cardiology has been recommended.      Jude Norton, NP 09/29/2023, 11:57 AM

## 2023-09-29 NOTE — Patient Instructions (Addendum)
 Medication Instructions:  Your physician recommends that you continue on your current medications as directed. Please refer to the Current Medication list given to you today.  *If you need a refill on your cardiac medications before your next appointment, please call your pharmacy*  Lab Work: CBC today  Testing/Procedures: Your physician has requested that you have a cardiac catheterization. Cardiac catheterization is used to diagnose and/or treat various heart conditions. Doctors may recommend this procedure for a number of different reasons. The most common reason is to evaluate chest pain. Chest pain can be a symptom of coronary artery disease (CAD), and cardiac catheterization can show whether plaque is narrowing or blocking your heart's arteries. This procedure is also used to evaluate the valves, as well as measure the blood flow and oxygen levels in different parts of your heart. For further information please visit [email or web address] Please follow instruction sheet, as given.   Follow-Up: At [facility], you and your health needs are our priority.  As part of our continuing mission to provide you with exceptional heart care, our providers are all part of one team.  This team includes your primary Cardiologist (physician) and Advanced Practice Providers or APPs (Physician Assistants and Nurse Practitioners) who all work together to provide you with the care you need, when you need it.  Your next appointment:   2-3 weeks post CATH  Provider:   Marlana Silvan NP or Any APP    We recommend signing up for the patient portal called MyChart.  Sign up information is provided on this After Visit Summary.  MyChart is used to connect with patients for Virtual Visits (Telemedicine).  Patients are able to view lab/test results, encounter notes, upcoming appointments, etc.  Non-urgent messages can be sent to your provider as well.   To learn more about what you can do with MyChart, go to  [email or web address].    [facility] A DEPT OF [place]  [facility] [address] A DEPT OF THE [place] [address] [place] [address] Dept: [phone] Loc: [phone]  KELII CHITTUM  09/29/2023  You are scheduled for a Cardiac Catheterization on Tuesday, June 17 with Dr. Peter Swaziland.  1. Please arrive at the [facility] (Main Entrance A) at [facility]: [address] [place], [place] [address] at 7:00 AM (This time is 2 hour(s) before your procedure to ensure your preparation).   Free valet parking service is available. You will check in at ADMITTING. The support person will be asked to wait in the waiting room.  It is OK to have someone drop you off and come back when you are ready to be discharged.    Special note: Every effort is made to have your procedure done on time. Please understand that emergencies sometimes delay scheduled procedures.  2. Diet: Do not eat solid foods after midnight.  The patient may have clear liquids until 5am upon the day of the procedure.  3. Labs: You will need to have blood drawn today    CBC  4. Medication instructions in preparation for your procedure:          On the morning of your procedure, take your Aspirin  81 mg and any morning medicines NOT listed above.  You may use sips of water .  5. Plan to go home the same day, you will only stay overnight if medically necessary. 6. Bring a current list of your medications and current insurance  cards. 7. You MUST have a responsible person to drive you home. 8. Someone MUST be with you the first 24 hours after you arrive home or your discharge will be delayed. 9. Please wear clothes that are easy to get on and off and wear slip-on shoes.  Thank you for allowing us  to care for you!   -- [facility]

## 2023-09-30 ENCOUNTER — Ambulatory Visit: Payer: Self-pay | Admitting: Nurse Practitioner

## 2023-09-30 LAB — CBC WITH DIFFERENTIAL/PLATELET
Basophils Absolute: 0.1 10*3/uL (ref 0.0–0.2)
Basos: 0 %
EOS (ABSOLUTE): 0.2 10*3/uL (ref 0.0–0.4)
Eos: 2 %
Hematocrit: 40.4 % (ref 37.5–51.0)
Hemoglobin: 13.7 g/dL (ref 13.0–17.7)
Immature Grans (Abs): 0 10*3/uL (ref 0.0–0.1)
Immature Granulocytes: 0 %
Lymphocytes Absolute: 10 10*3/uL — ABNORMAL HIGH (ref 0.7–3.1)
Lymphs: 70 %
MCH: 34.6 pg — ABNORMAL HIGH (ref 26.6–33.0)
MCHC: 33.9 g/dL (ref 31.5–35.7)
MCV: 102 fL — ABNORMAL HIGH (ref 79–97)
Monocytes Absolute: 0.5 10*3/uL (ref 0.1–0.9)
Monocytes: 3 %
Neutrophils Absolute: 3.6 10*3/uL (ref 1.4–7.0)
Neutrophils: 25 %
Platelets: 284 10*3/uL (ref 150–450)
RBC: 3.96 x10E6/uL — ABNORMAL LOW (ref 4.14–5.80)
RDW: 12.4 % (ref 11.6–15.4)
WBC: 14.3 10*3/uL — ABNORMAL HIGH (ref 3.4–10.8)

## 2023-10-01 ENCOUNTER — Encounter: Payer: Self-pay | Admitting: Nurse Practitioner

## 2023-10-01 ENCOUNTER — Telehealth: Payer: Self-pay

## 2023-10-01 NOTE — Telephone Encounter (Signed)
 Pt left a VM and came in checking on the status of his brace.

## 2023-10-04 ENCOUNTER — Telehealth: Payer: Self-pay | Admitting: *Deleted

## 2023-10-04 ENCOUNTER — Other Ambulatory Visit

## 2023-10-04 NOTE — Telephone Encounter (Signed)
 Cardiac Catheterization scheduled at [facility] for: Tuesday October 05, 2023 9 AM Arrival time [facility] Main Entrance A at: 7 AM  Nothing to eat after midnight prior to procedure, clear liquids until 5 AM day of procedure.  Medication instructions: -Hold:  Insulin  pump -pt will manage -Other usual morning medications can be taken with sips of water  including aspirin  81 mg.  Plan to go home the same day, you will only stay overnight if medically necessary.  You must have responsible adult to drive you home.  Someone must be with you the first 24 hours after you arrive home.  Reviewed procedure instructions with patient.

## 2023-10-05 ENCOUNTER — Ambulatory Visit (HOSPITAL_COMMUNITY)
Admission: RE | Admit: 2023-10-05 | Discharge: 2023-10-05 | Disposition: A | Discharge status: Home / Self Care | Attending: Cardiology | Admitting: Cardiology

## 2023-10-05 ENCOUNTER — Encounter (HOSPITAL_COMMUNITY)
Admission: RE | Disposition: A | Discharge status: Home / Self Care | Payer: Self-pay | Source: Home / Self Care | Attending: Cardiology

## 2023-10-05 ENCOUNTER — Other Ambulatory Visit: Payer: Self-pay

## 2023-10-05 DIAGNOSIS — I080 Rheumatic disorders of both mitral and aortic valves: Secondary | ICD-10-CM | POA: Diagnosis not present

## 2023-10-05 DIAGNOSIS — Z7982 Long term (current) use of aspirin: Secondary | ICD-10-CM | POA: Diagnosis not present

## 2023-10-05 DIAGNOSIS — E119 Type 2 diabetes mellitus without complications: Secondary | ICD-10-CM | POA: Insufficient documentation

## 2023-10-05 DIAGNOSIS — I2510 Atherosclerotic heart disease of native coronary artery without angina pectoris: Secondary | ICD-10-CM | POA: Diagnosis not present

## 2023-10-05 DIAGNOSIS — R600 Localized edema: Secondary | ICD-10-CM | POA: Insufficient documentation

## 2023-10-05 DIAGNOSIS — Z79899 Other long term (current) drug therapy: Secondary | ICD-10-CM | POA: Insufficient documentation

## 2023-10-05 DIAGNOSIS — Z794 Long term (current) use of insulin: Secondary | ICD-10-CM | POA: Insufficient documentation

## 2023-10-05 DIAGNOSIS — I10 Essential (primary) hypertension: Secondary | ICD-10-CM | POA: Diagnosis not present

## 2023-10-05 DIAGNOSIS — R0789 Other chest pain: Secondary | ICD-10-CM | POA: Diagnosis present

## 2023-10-05 DIAGNOSIS — E7800 Pure hypercholesterolemia, unspecified: Secondary | ICD-10-CM | POA: Insufficient documentation

## 2023-10-05 DIAGNOSIS — Z9641 Presence of insulin pump (external) (internal): Secondary | ICD-10-CM | POA: Insufficient documentation

## 2023-10-05 DIAGNOSIS — I447 Left bundle-branch block, unspecified: Secondary | ICD-10-CM | POA: Diagnosis not present

## 2023-10-05 LAB — GLUCOSE, CAPILLARY
Glucose-Capillary: 77 mg/dL (ref 70–99)
Glucose-Capillary: 95 mg/dL (ref 70–99)

## 2023-10-05 SURGERY — LEFT HEART CATH AND CORONARY ANGIOGRAPHY
Anesthesia: LOCAL

## 2023-10-05 MED ORDER — HEPARIN SODIUM (PORCINE) 1000 UNIT/ML IJ SOLN
INTRAMUSCULAR | Status: AC
  Filled 2023-10-05: qty 10

## 2023-10-05 MED ORDER — HYDRALAZINE HCL 20 MG/ML IJ SOLN: 10.0000 mg | INTRAMUSCULAR | Status: DC | PRN

## 2023-10-05 MED ORDER — MIDAZOLAM HCL 2 MG/2ML IJ SOLN
INTRAMUSCULAR | Status: AC
Start: 2023-10-05 — End: 2023-10-05
  Filled 2023-10-05: qty 2

## 2023-10-05 MED ORDER — LIDOCAINE HCL (PF) 1 % IJ SOLN
INTRAMUSCULAR | Status: DC | PRN
  Administered 2023-10-05: 2 mL

## 2023-10-05 MED ORDER — SODIUM CHLORIDE 0.9% FLUSH: 3.0000 mL | INTRAVENOUS | Status: DC | PRN

## 2023-10-05 MED ORDER — SODIUM CHLORIDE 0.9 % IV SOLN: 250.0000 mL | INTRAVENOUS | Status: DC | PRN

## 2023-10-05 MED ORDER — VERAPAMIL HCL 2.5 MG/ML IV SOLN
INTRAVENOUS | Status: DC | PRN
  Administered 2023-10-05: 10 mL via INTRA_ARTERIAL

## 2023-10-05 MED ORDER — LIDOCAINE HCL (PF) 1 % IJ SOLN
INTRAMUSCULAR | Status: AC
  Filled 2023-10-05: qty 30

## 2023-10-05 MED ORDER — ACETAMINOPHEN 325 MG PO TABS: 650.0000 mg | ORAL_TABLET | ORAL | Status: DC | PRN

## 2023-10-05 MED ORDER — HEPARIN SODIUM (PORCINE) 1000 UNIT/ML IJ SOLN
INTRAMUSCULAR | Status: DC | PRN
  Administered 2023-10-05: 4000 [IU] via INTRAVENOUS

## 2023-10-05 MED ORDER — MIDAZOLAM HCL 2 MG/2ML IJ SOLN
INTRAMUSCULAR | Status: DC | PRN
  Administered 2023-10-05: 2 mg via INTRAVENOUS

## 2023-10-05 MED ORDER — IOHEXOL 350 MG/ML SOLN
INTRAVENOUS | Status: DC | PRN
  Administered 2023-10-05: 40 mL

## 2023-10-05 MED ORDER — ONDANSETRON HCL 4 MG/2ML IJ SOLN: 4.0000 mg | Freq: Four times a day (QID) | INTRAMUSCULAR | Status: DC | PRN

## 2023-10-05 MED ORDER — VERAPAMIL HCL 2.5 MG/ML IV SOLN
INTRAVENOUS | Status: AC
  Filled 2023-10-05: qty 2

## 2023-10-05 MED ORDER — FENTANYL CITRATE (PF) 100 MCG/2ML IJ SOLN
INTRAMUSCULAR | Status: DC | PRN
  Administered 2023-10-05: 25 ug via INTRAVENOUS

## 2023-10-05 MED ORDER — ASPIRIN 81 MG PO CHEW: 81.0000 mg | CHEWABLE_TABLET | ORAL | Status: DC

## 2023-10-05 MED ORDER — SODIUM CHLORIDE 0.9 % WEIGHT BASED INFUSION: 3.0000 mL/kg/h | INTRAVENOUS | Status: AC

## 2023-10-05 MED ORDER — SODIUM CHLORIDE 0.9% FLUSH: 3.0000 mL | Freq: Two times a day (BID) | INTRAVENOUS | Status: DC

## 2023-10-05 MED ORDER — FENTANYL CITRATE (PF) 100 MCG/2ML IJ SOLN
INTRAMUSCULAR | Status: AC
Start: 2023-10-05 — End: 2023-10-05
  Filled 2023-10-05: qty 2

## 2023-10-05 MED ORDER — SODIUM CHLORIDE 0.9 % WEIGHT BASED INFUSION: 1.0000 mL/kg/h | INTRAVENOUS | Status: DC

## 2023-10-05 MED ORDER — HEPARIN (PORCINE) IN NACL 1000-0.9 UT/500ML-% IV SOLN
INTRAVENOUS | Status: DC | PRN
  Administered 2023-10-05 (×2): 500 mL

## 2023-10-05 SURGICAL SUPPLY — 8 items
CATH 5FR JL3.5 JR4 ANG PIG MP (CATHETERS) IMPLANT
DEVICE RAD COMP TR BAND LRG (VASCULAR PRODUCTS) IMPLANT
GLIDESHEATH SLEND SS 6F .021 (SHEATH) IMPLANT
GUIDEWIRE INQWIRE 1.5J.035X260 (WIRE) IMPLANT
GUIDEWIRE TIGER .035X300 (WIRE) IMPLANT
PACK CARDIAC CATHETERIZATION (CUSTOM PROCEDURE TRAY) ×1 IMPLANT
SET ATX-X65L (MISCELLANEOUS) IMPLANT
SHEATH PROBE COVER 6X72 (BAG) IMPLANT

## 2023-10-05 NOTE — Discharge Instructions (Signed)
 Radial Site Care  This sheet gives you information about how to care for yourself after your procedure. Your health care provider may also give you more specific instructions. If you have problems or questions, contact your health care provider. What can I expect after the procedure? After the procedure, it is common to have: Bruising and tenderness at the catheter insertion area. Follow these instructions at home: Medicines Take over-the-counter and prescription medicines only as told by your health care provider. Insertion site care Follow instructions from your health care provider about how to take care of your insertion site. Make sure you: Wash your hands with soap and water before you remove your bandage (dressing). If soap and water are not available, use hand sanitizer. May remove dressing in 24 hours. Check your insertion site every day for signs of infection. Check for: Redness, swelling, or pain. Fluid or blood. Pus or a bad smell. Warmth. Do no take baths, swim, or use a hot tub for 5 days. You may shower 24-48 hours after the procedure. Remove the dressing and gently wash the site with plain soap and water. Pat the area dry with a clean towel. Do not rub the site. That could cause bleeding. Do not apply powder or lotion to the site. Activity  For 24 hours after the procedure, or as directed by your health care provider: Do not flex or bend the affected arm. Do not push or pull heavy objects with the affected arm. Do not drive yourself home from the hospital or clinic. You may drive 24 hours after the procedure. Do not operate machinery or power tools. KEEP ARM ELEVATED THE REMAINDER OF THE DAY. Do not push, pull or lift anything that is heavier than 10 lb for 5 days. Ask your health care provider when it is okay to: Return to work or school. Resume usual physical activities or sports. Resume sexual activity. General instructions If the catheter site starts to  bleed, raise your arm and put firm pressure on the site. If the bleeding does not stop, get help right away. This is a medical emergency. DRINK PLENTY OF FLUIDS FOR THE NEXT 2-3 DAYS. No alcohol consumption for 24 hours after receiving sedation. If you went home on the same day as your procedure, a responsible adult should be with you for the first 24 hours after you arrive home. Keep all follow-up visits as told by your health care provider. This is important. Contact a health care provider if: You have a fever. You have redness, swelling, or yellow drainage around your insertion site. Get help right away if: You have unusual pain at the radial site. The catheter insertion area swells very fast. The insertion area is bleeding, and the bleeding does not stop when you hold steady pressure on the area. Your arm or hand becomes pale, cool, tingly, or numb. These symptoms may represent a serious problem that is an emergency. Do not wait to see if the symptoms will go away. Get medical help right away. Call your local emergency services (911 in the [place] Do not drive yourself to the hospital. Summary After the procedure, it is common to have bruising and tenderness at the site. Follow instructions from your health care provider about how to take care of your radial site wound. Check the wound every day for signs of infection.  This information is not intended to replace advice given to you by your health care provider. Make sure you discuss any questions you have with  your health care provider. Document Revised: 05/12/2017 Document Reviewed: 05/12/2017 Elsevier Patient Education  2020 [employer].

## 2023-10-05 NOTE — Interval H&P Note (Signed)
 History and Physical Interval Note:  10/05/2023 8:02 AM  Christopher Conner  has presented today for surgery, with the diagnosis of cp.  The various methods of treatment have been discussed with the patient and family. After consideration of risks, benefits and other options for treatment, the patient has consented to  Procedure(s): LEFT HEART CATH AND CORONARY ANGIOGRAPHY ([name]) as a surgical intervention.  The patient's history has been reviewed, patient examined, no change in status, stable for surgery.  I have reviewed the patient's chart and labs.  Questions were answered to the patient's satisfaction.   Cath Lab Visit (complete for each Cath Lab visit)  Clinical Evaluation Leading to the Procedure:   ACS: No.  Non-ACS:    Anginal Classification: CCS II  Anti-ischemic medical therapy: Maximal Therapy (2 or more classes of medications)  Non-Invasive Test Results: Intermediate-risk stress test findings: cardiac mortality 1-3%/year  Prior CABG: No previous CABG        Donata Fryer [facility] 10/05/2023 8:02 AM

## 2023-10-06 ENCOUNTER — Encounter (HOSPITAL_COMMUNITY): Payer: Self-pay | Admitting: Cardiology

## 2023-10-08 ENCOUNTER — Ambulatory Visit: Admitting: Nurse Practitioner

## 2023-10-15 NOTE — Telephone Encounter (Signed)
 Pt left a VM checking on the status of his brace.

## 2023-10-21 NOTE — Telephone Encounter (Signed)
 Sent patient a message today asking him to call and set appt for scanning of brace

## 2023-10-25 NOTE — Telephone Encounter (Signed)
 Requesting that you please call him to discuss the questions he has about the scanning.

## 2023-10-26 ENCOUNTER — Telehealth: Payer: Self-pay | Admitting: Podiatry

## 2023-10-26 NOTE — Telephone Encounter (Signed)
 Patient called back in regards to brace. He is wanting to know if the brace absolutely does not work for him is there a way to return it or would he essentially be stuck with something that is not useful to him. Please advise or contact patient back. Thanks!

## 2023-10-29 ENCOUNTER — Ambulatory Visit: Attending: Nurse Practitioner | Admitting: Nurse Practitioner

## 2023-10-29 ENCOUNTER — Encounter: Payer: Self-pay | Admitting: Nurse Practitioner

## 2023-10-29 VITALS — BP 127/67 | HR 64 | Ht 69.0 in | Wt 174.8 lb

## 2023-10-29 DIAGNOSIS — I447 Left bundle-branch block, unspecified: Secondary | ICD-10-CM | POA: Diagnosis not present

## 2023-10-29 DIAGNOSIS — I351 Nonrheumatic aortic (valve) insufficiency: Secondary | ICD-10-CM | POA: Insufficient documentation

## 2023-10-29 DIAGNOSIS — E785 Hyperlipidemia, unspecified: Secondary | ICD-10-CM | POA: Insufficient documentation

## 2023-10-29 DIAGNOSIS — I10 Essential (primary) hypertension: Secondary | ICD-10-CM | POA: Diagnosis not present

## 2023-10-29 DIAGNOSIS — I340 Nonrheumatic mitral (valve) insufficiency: Secondary | ICD-10-CM | POA: Insufficient documentation

## 2023-10-29 DIAGNOSIS — I2510 Atherosclerotic heart disease of native coronary artery without angina pectoris: Secondary | ICD-10-CM | POA: Insufficient documentation

## 2023-10-29 MED ORDER — ISOSORBIDE MONONITRATE ER 30 MG PO TB24: 30.0000 mg | ORAL_TABLET | Freq: Every day | ORAL | 3 refills | Status: DC

## 2023-10-29 NOTE — Patient Instructions (Signed)
 Medication Instructions:  Start Imdur  30 mg daily  *If you need a refill on your cardiac medications before your next appointment, please call your pharmacy*  Lab Work: NONE ordered at this time of appointment   Testing/Procedures: NONE ordered at this time of appointment   Follow-Up: At [facility], you and your health needs are our priority.  As part of our continuing mission to provide you with exceptional heart care, our providers are all part of one team.  This team includes your primary Cardiologist (physician) and Advanced Practice Providers or APPs (Physician Assistants and Nurse Practitioners) who all work together to provide you with the care you need, when you need it.  Your next appointment:    Keep appointment   Provider:   Redell Shallow, MD    We recommend signing up for the patient portal called MyChart.  Sign up information is provided on this After Visit Summary.  MyChart is used to connect with patients for Virtual Visits (Telemedicine).  Patients are able to view lab/test results, encounter notes, upcoming appointments, etc.  Non-urgent messages can be sent to your provider as well.   To learn more about what you can do with MyChart, go to [email or web address].

## 2023-10-29 NOTE — Progress Notes (Unsigned)
 Office Visit    Patient Name: Christopher Conner Date of Encounter: 10/29/2023  Primary Care Provider:  Oris Camie BRAVO, NP Primary Cardiologist:  Redell Shallow, MD  Chief Complaint    73 year old male with a history of CAD, LBBB, mitral valve regurgitation, aortic valve regurgitation, hypertension, hyperlipidemia, type 1 diabetes with diabetic neuropathy, BPH, normal pressure hydrocephalus and chronic lymphocytic leukemia who presents for follow-up related to CAD.   Past Medical History    Past Medical History:  Diagnosis Date   Anemia 07/15/2023   Arthritis    Bilateral foot-drop    after back surgery   BPH (benign prostatic hyperplasia)    Cataract    Chronic lymphocytic leukemia (HCC) early 2018   no current treatment   CLL (chronic lymphocytic leukemia) (HCC)    DM type 1 (diabetes mellitus, type 1) (HCC)    type 1   Goals of care, counseling/discussion 07/18/2023   High cholesterol    Hypertension    Murmur, cardiac 07/15/2023   Neuromuscular disorder (HCC)    diabetic neuropathy   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   Scattered rhonchi of right lung 11/22/2022   Past Surgical History:  Procedure Laterality Date   APPENDECTOMY  early 20's   BACK SURGERY     BRAIN SURGERY  10/30/04   brain shunt, [facility] Hakim   CATARACT EXTRACTION Bilateral    left eye prostheisi shell   CYST REMOVAL NECK  yrs ago   CYSTOSCOPY [name] 04/22/2023   Procedure: CYSTOSCOPY;  Surgeon: Sherrilee Belvie CROME, MD;  Location: [facility];  Service: Urology;  Laterality: [name];   CYSTOSCOPY WITH INJECTION [name] 12/17/2022   Procedure: CYSTOSCOPY WITH INJECTION-Botox ;  Surgeon: Sherrilee Belvie CROME, MD;  Location: [facility];  Service: Urology;  Laterality: [name];   CYSTOSCOPY WITH INSERTION OF UROLIFT  april/march 2020   done in office   CYSTOSCOPY WITH INSERTION OF UROLIFT [name] 10/12/2019   Procedure: CYSTOSCOPY WITH INSERTION OF UROLIFT;  Surgeon: Sherrilee Belvie CROME, MD;  Location:  [facility];  Service: Urology;  Laterality: [name];   EYE SURGERY     LEFT HEART CATH AND CORONARY ANGIOGRAPHY [name] 10/05/2023   Procedure: LEFT HEART CATH AND CORONARY ANGIOGRAPHY;  Surgeon: Swaziland, Peter M, MD;  Location: [facility];  Service: Cardiovascular;  Laterality: [name];   LUMBAR FUSION  1998 and 2000   l5 to s1 1998, then l4 and l5 in 2000   SPINE SURGERY     H. Elsner, L4-5 & L5-S1   TRANSURETHRAL RESECTION OF PROSTATE [name] 04/22/2023   Procedure: TRANSURETHRAL RESECTION OF THE PROSTATE (TURP);  Surgeon: Sherrilee Belvie CROME, MD;  Location: [facility];  Service: Urology;  Laterality: [name];   VENTRICULOPERITONEAL SHUNT  2006    Allergies  Allergies  Allergen Reactions   Lisinopril  Cough   Latex Itching and Rash     Labs/Other Studies Reviewed    The following studies were reviewed today:  Cardiac Studies & Procedures   ______________________________________________________________________________________________ CARDIAC CATHETERIZATION  CARDIAC CATHETERIZATION 10/05/2023  Conclusion   Mid LAD lesion is 60% stenosed.   Prox Cx lesion is 30% stenosed.   Prox RCA lesion is 30% stenosed.   Dist RCA lesion is 30% stenosed.   The left ventricular systolic function is normal.   LV end diastolic pressure is normal.   The left ventricular ejection fraction is 55-65% by visual estimate.  Moderate segmental stenosis in the mid LAD- 60% Otherwise minor nonobstructive  disease Normal LV function Normal LVEDP   Plan: recommend continued medical therapy. If cardiac cath is needed in the future I would not use right radial access.  Findings Coronary Findings Diagnostic  Dominance: Right  Left Main Vessel was injected. Vessel is large. Vessel is angiographically normal.  Left Anterior Descending Vessel is normal in caliber. Mid LAD lesion is 60% stenosed.  Left Circumflex Prox Cx lesion is 30% stenosed.  Right Coronary Artery Prox RCA lesion is 30%  stenosed. Dist RCA lesion is 30% stenosed.  Intervention  No interventions have been documented.     ECHOCARDIOGRAM  ECHOCARDIOGRAM COMPLETE 09/06/2023  Narrative ECHOCARDIOGRAM REPORT    Patient Name:   Christopher Conner Date of Exam: 09/06/2023 Medical Rec #:  [id number]      Height:       68.0 in Accession #:    [id number]     Weight:       171.0 lb Date of Birth:  [birth year]     BSA:          1.912 m Patient Age:    72 years       BP:           152/75 mmHg Patient Gender: M              HR:           57 bpm. Exam Location:  Outpatient  Procedure: 2D Echo, Cardiac Doppler and Color Doppler (Both Spectral and Color Flow Doppler were utilized during procedure).  Indications:    Murmur, cardiac [R01.1 (ICD-10-CM)]; Peripheral edema [R60.0 (ICD-10-CM)]  History:        Patient has prior history of Echocardiogram examinations, most recent 04/10/2014. Risk Factors:Hypertension, Diabetes and Non-Smoker.  Sonographer:    Rosaline Fujisawa MHA, RDMS, RVT, RDCS Referring Phys: [phone] SARA E EARLY   Sonographer Comments: Suboptimal subcostal window. Image acquisition challenging due to respiratory motion. IMPRESSIONS   1. Left ventricular ejection fraction, by estimation, is 60 to 65%. Left ventricular ejection fraction by 3D volume is 59 %. The left ventricle has normal function. The left ventricle has no regional wall motion abnormalities. Left ventricular diastolic parameters are consistent with Grade I diastolic dysfunction (impaired relaxation). The global longitudinal strain is normal. 2. Right ventricular systolic function is normal. The right ventricular size is normal. There is normal pulmonary artery systolic pressure. The estimated right ventricular systolic pressure is 24.2 mmHg. 3. Left atrial size was mild to moderately dilated. 4. Right atrial size was mildly dilated. 5. The mitral valve is degenerative. Moderate mitral valve regurgitation. 6. The aortic valve is  tricuspid. There is mild calcification of the aortic valve. There is mild thickening of the aortic valve. Aortic valve regurgitation is mild to moderate. Aortic valve sclerosis/calcification is present, without any evidence of aortic stenosis. Aortic regurgitation PHT measures 425 msec.  FINDINGS Left Ventricle: Left ventricular ejection fraction, by estimation, is 60 to 65%. Left ventricular ejection fraction by 3D volume is 59 %. The left ventricle has normal function. The left ventricle has no regional wall motion abnormalities. Strain was performed and the global longitudinal strain is normal. The left ventricular internal cavity size was normal in size. There is no left ventricular hypertrophy. Left ventricular diastolic parameters are consistent with Grade I diastolic dysfunction (impaired relaxation). Indeterminate filling pressures.  Right Ventricle: The right ventricular size is normal. No increase in right ventricular wall thickness. Right ventricular systolic function is normal. There is normal pulmonary artery systolic pressure.  The tricuspid regurgitant velocity is 2.30 m/s, and with an assumed right atrial pressure of 3 mmHg, the estimated right ventricular systolic pressure is 24.2 mmHg.  Left Atrium: Left atrial size was mild to moderately dilated.  Right Atrium: Right atrial size was mildly dilated.  Pericardium: There is no evidence of pericardial effusion.  Mitral Valve: The mitral valve is degenerative in appearance. There is mild thickening of the mitral valve leaflet(s). Mild to moderate mitral annular calcification. Moderate mitral valve regurgitation, with centrally-directed jet.  Tricuspid Valve: The tricuspid valve is normal in structure. Tricuspid valve regurgitation is trivial.  Aortic Valve: The aortic valve is tricuspid. There is mild calcification of the aortic valve. There is mild thickening of the aortic valve. Aortic valve regurgitation is mild to moderate.  Aortic regurgitation PHT measures 425 msec. Aortic valve sclerosis/calcification is present, without any evidence of aortic stenosis. Aortic valve mean gradient measures 3.0 mmHg. Aortic valve peak gradient measures 5.4 mmHg. Aortic valve area, by VTI measures 2.77 cm.  Pulmonic Valve: The pulmonic valve was not well visualized. Pulmonic valve regurgitation is not visualized.  Aorta: The aortic root and ascending aorta are structurally normal, with no evidence of dilitation.  Venous: The inferior vena cava was not well visualized.  IAS/Shunts: No atrial level shunt detected by color flow Doppler.  Additional Comments: 3D was performed not requiring image post processing on an independent workstation and was normal.   LEFT VENTRICLE PLAX 2D LVIDd:         4.38 cm         Diastology LVIDs:         2.96 cm         LV e' medial:    5.11 cm/s LV PW:         0.98 cm         LV E/e' medial:  17.8 LV IVS:        1.04 cm         LV e' lateral:   8.05 cm/s LVOT diam:     2.00 cm         LV E/e' lateral: 11.3 LV SV:         86 LV SV Index:   45 LVOT Area:     3.14 cm        3D Volume EF LV 3D EF:    Left ventricul ar ejection fraction by 3D volume is 59 %.  3D Volume EF: 3D EF:        59 % LV EDV:       166 ml LV ESV:       68 ml LV SV:        98 ml  RIGHT VENTRICLE             IVC RV Basal diam:  4.62 cm     IVC diam: 1.40 cm RV Mid diam:    3.88 cm RV S prime:     14.30 cm/s TAPSE (M-mode): 2.2 cm  LEFT ATRIUM           Index        RIGHT ATRIUM           Index LA diam:      3.30 cm 1.73 cm/m   RA Area:     16.30 cm LA Vol (A2C): 72.7 ml 38.02 ml/m  RA Volume:   40.10 ml  20.97 ml/m LA Vol (A4C): 65.9 ml 34.47 ml/m AORTIC VALVE AV Area (  Vmax):    2.58 cm AV Area (Vmean):   2.51 cm AV Area (VTI):     2.77 cm AV Vmax:           116.00 cm/s AV Vmean:          80.200 cm/s AV VTI:            0.312 m AV Peak Grad:      5.4 mmHg AV Mean Grad:      3.0 mmHg LVOT  Vmax:         95.20 cm/s LVOT Vmean:        64.000 cm/s LVOT VTI:          0.275 m LVOT/AV VTI ratio: 0.88 AI PHT:            425 msec  AORTA Ao Root diam: 3.30 cm Ao Asc diam:  2.80 cm  MITRAL VALVE                           TRICUSPID VALVE MV Area (PHT): 4.15 cm                TR Peak grad:   21.2 mmHg MV Area VTI:   0.96 cm                TR Vmax:        230.00 cm/s MV VTI:        0.90 m MV Decel Time: 183 msec                SHUNTS MR Peak grad:    53.0 mmHg             Systemic VTI:  0.28 m MR Vmax:         364.00 cm/s           Systemic Diam: 2.00 cm MR PISA Nyquist:             -0.3 m/s MR PISA:         2.11 cm MR PISA Radius:  0.58 cm MV E velocity: 90.90 cm/s MV A velocity: 116.00 cm/s MV E/A ratio:  0.78  Mihai Croitoru MD Electronically signed by Jerel Balding MD Signature Date/Time: 09/06/2023/5:12:04 PM    Final      CT SCANS  CT CORONARY MORPH W/CTA COR W/SCORE 09/08/2023  Addendum 09/24/2023  7:17 PM ADDENDUM REPORT: 09/24/2023 19:14  EXAM: OVER-READ INTERPRETATION  CT CHEST  The following report is an over-read performed by radiologist Dr. Oneil Devonshire of [facility] on 09/24/2023. This over-read does not include interpretation of cardiac or coronary anatomy or pathology. The coronary calcium  score/coronary CTA interpretation by the cardiologist is attached.  COMPARISON:  None.  FINDINGS: Cardiovascular: Atherosclerotic calcifications of the thoracic aorta are noted without aneurysmal dilatation. No dissection is seen.  Mediastinum/Nodes: There are no enlarged lymph nodes within the visualized mediastinum.  Lungs/Pleura: There is no pleural effusion. The visualized lungs appear clear.  Upper abdomen: No significant findings in the visualized upper abdomen.  Musculoskeletal/Chest wall: No chest wall mass or suspicious osseous findings within the visualized chest.  IMPRESSION: No significant extracardiac findings within the  visualized chest.   Electronically Signed By: Oneil Devonshire M.D. On: 09/24/2023 19:14  Narrative CLINICAL DATA:  Chest pain  EXAM: Cardiac/Coronary CTA  TECHNIQUE: A non-contrast, gated CT scan was obtained with axial slices of 3 mm through the heart for calcium  scoring. Calcium  scoring was  performed using the Agatston method. A 120 kV prospective, gated, contrast cardiac scan was obtained. Gantry rotation speed was 250 msecs and collimation was 0.6 mm. Two sublingual nitroglycerin  tablets (0.8 mg) were given. The 3D data set was reconstructed in 5% intervals of the 35-75% of the R-R cycle. Diastolic phases were analyzed on a dedicated workstation using MPR, MIP, and VRT modes. The patient received 95 cc of contrast.  FINDINGS: Image quality: Excellent.  Noise artifact is: Limited.  Coronary Arteries:  Normal coronary origin.  Right dominance.  Left main: The left main is a large caliber vessel with a normal take off from the left coronary cusp that trifurcates into a LAD, LCX, and ramus intermedius. There is minimal calcified plaque in the distal LM with associated stenosis of <25%.  Left anterior descending artery: The LAD gives off 2 patent diagonal branches. There is mild calcified plaque in the proximal LAD with associated stenosis of 25-49%. There is moderate calcified plaque in the mid LAD/proximal D2 with associated stenosis of 50-69%.  Ramus intermedius: There is mild to moderate calcified plaque in the proximal Ramus with associated stenosis of at least 25-49% and may be > 50%.  Left circumflex artery: The LCX is non-dominant and gives off 2 patent obtuse marginal branches. There is mild calcified plaque in the proximal and distal LCx with associated stenosis of 25-49%. There there is moderate calcified plaque in the mid LCx with associated stenosis of 50-69%.  Right coronary artery: The RCA is dominant with normal take off from the right coronary cusp.  There is mild calcified plaque in the proximal and mid RCA with associated stenosis of 25-49%. There is moderate calcified plaque in the mid to distal RCA with associated stenosis of 50-69%. The RCA terminates as a PDA and right posterolateral branch without evidence of plaque or stenosis.  Right Atrium: Right atrial size is within normal limits.  Right Ventricle: The right ventricular cavity is within normal limits.  Left Atrium: Left atrial size is normal in size with no left atrial appendage filling defect.  Left Ventricle: The ventricular cavity size is within normal limits.  Pulmonary arteries: Normal in size.  Pulmonary veins: Normal pulmonary venous drainage with 3 right sided pulmonary veins and 2 on the left.  Pericardium: Normal thickness without significant effusion or calcium  present.  Cardiac valves: The aortic valve is trileaflet without significant calcification. The mitral valve is normal without significant calcification.  Aorta: Normal caliber without significant disease.  Possible small PFO.  Extra-cardiac findings: See attached radiology report for non-cardiac structures.  IMPRESSION: 1. Coronary calcium  score of 871. This was 80th percentile for age-, sex, and race-matched controls.  2. Total plaque volume 962 mm3 which is 67th percentile for age- and sex-matched controls (calcified plaque 215 mm3; non-calcified plaque 747 mm3). TPV is extensive.  3. Normal coronary origin with right dominance.  4. Moderate atherosclerosis: 50-69% mid LCx/mid to distal RCA/mid LAD/Prox D2.  5. Consider symptom-guided anti-ischemic and preventive pharmacotherapy as well as risk factor modification per guideline-directed care.  6. This study has been submitted for FFR analysis.  RECOMMENDATIONS: 1. CAD-RADS 0: No evidence of CAD (0%). Consider non-atherosclerotic causes of chest pain.  2. CAD-RADS 1: Minimal non-obstructive CAD (0-24%).  Consider non-atherosclerotic causes of chest pain. Consider preventive therapy and risk factor modification.  3. CAD-RADS 2: Mild non-obstructive CAD (25-49%). Consider non-atherosclerotic causes of chest pain. Consider preventive therapy and risk factor modification.  4. CAD-RADS 3: Moderate stenosis. Consider symptom-guided anti-ischemic pharmacotherapy  as well as risk factor modification per guideline directed care. Additional analysis with CT FFR will be submitted.  5. CAD-RADS 4: Severe stenosis. (70-99% or > 50% left main). Cardiac catheterization or CT FFR is recommended. Consider symptom-guided anti-ischemic pharmacotherapy as well as risk factor modification per guideline directed care. Invasive coronary angiography recommended with revascularization per published guideline statements.  6. CAD-RADS 5: Total coronary occlusion (100%). Consider cardiac catheterization or viability assessment. Consider symptom-guided anti-ischemic pharmacotherapy as well as risk factor modification per guideline directed care.  7. CAD-RADS N: Non-diagnostic study. Obstructive CAD can't be excluded. Alternative evaluation is recommended.  Wilbert Bihari, MD  Electronically Signed: By: Wilbert Bihari M.D. On: 09/09/2023 22:22     ______________________________________________________________________________________________     Recent Labs: 06/25/2023: BNP 160.3 07/15/2023: ALT 29; TSH 1.890 09/07/2023: BUN 20; Creatinine, Ser 0.71; Potassium 4.9; Sodium 133 09/29/2023: Hemoglobin 13.7; Platelets 284  Recent Lipid Panel No results found for: CHOL, TRIG, HDL, CHOLHDL, VLDL, LDLCALC, LDLDIRECT  History of Present Illness    73 year old male with the above past medical history including CAD, LBBB, mitral valve regurgitation, aortic valve regurgitation, hypertension, hyperlipidemia, type 1 diabetes with diabetic neuropathy, BPH, normal pressure hydrocephalus and chronic  lymphocytic leukemia.   He was referred to cardiology in the setting of abnormal EKG, cardiac murmur, and dyspnea on exertion.  He established with Dr. Pietro and was last seen in the office on 08/23/2023.  EKG showed left bundle branch block.  He noted progressive dyspnea on exertion.  Echocardiogram in 08/2023 in the setting of cardiac murmur, dyspnea on exertion showed EF 60 to 65%, normal LV function, no RWMA, G1 DD, normal RV systolic function, mild to moderate left atrial dilation, mild right atrial dilation, moderate mitral valve regurgitation, mild to moderate aortic valve regurgitation.  Coronary CT angiogram revealed coronary calcium  score of 871 (80th percentile), extensive TPV, possible flow-limiting lesions in the mid LAD FFR 0.65, and distal RCA, FFR 0.74. He was advised to start aspirin  81 mg daily.  Possible cardiac catheterization was recommended based on symptoms.  He was last seen in the office on 09/29/2023 and was stable from a cardiac standpoint.  He noted intermittent fleeting chest discomfort, mild intermittent shortness of breath with activity, mild dependent nonpitting bilateral lower extremity edema.  Outpatient cardiac catheterization on 10/05/2023 revealed moderate segmental stenosis in the mid LAD, 60%, 30% proximal LCx stenosis, 30% proximal RCA stenosis, 30% distal RCA stenosis, normal LV systolic function, normal LVEDP, EF 55 to 60% by visual estimate. Ongoing medical therapy was recommended.  He presents today for follow-up.  Since his procedure From a cardiac standpoint.  He continues to note some dyspnea on exertion, intermittent fleeting chest discomfort, generalized fatigue.  Will trial Imdur  30 mg daily.  We had a detailed discussion regarding per symptoms, monitoring of symptoms.  Right radial cath site without bruising, bleeding, bruit or hematoma.  Encouraged activity as tolerated.  Follow-up as scheduled with Dr. Pietro in 11/2023. 1. CAD:  Coronary CT angiogram  revealed coronary calcium  score of 871 (8th percentile), extensive TPV, possible flow-limiting lesions in the mid LAD FFR 0.65, and distal RCA, FFR 0.74.  He was advised to start aspirin  81 mg daily.  Possible cardiac catheterization was recommended based on symptoms. He reports mild intermittent fleeting chest discomfort, mild intermittent shortness of breath with activity. Overall, his symptoms have been mild and stable.  At our prior visit we had a detailed discussion about his test results, treatment options, including, further diagnostic  testing with cardiac catheterization.  He returns today and is interested in proceeding with cardiac catheterization.  Discussed with Dr. Anner, DOD, we will go ahead and schedule left heart cath.  He had an appointment with his endocrinologist today and had a CMET drawn, will request copy of labs.  Will LHC is scheduled for 10/05/2023 with Dr. Swaziland. He is on an insulin  pump. Continue aspirin , carvedilol , telmisartan , amlodipine , Lipitor .    2. LBBB: Stable, now with evidence of CAD as above.   3. Mitral valve regurgitation/aortic valve regurgitation: Echocardiogram in 08/2023 in the setting of cardiac murmur, dyspnea on exertion showed EF 60 to 65%, normal LV function, no RWMA, G1 DD, normal RV systolic function, mild to moderate left atrial dilation, mild right atrial dilation, moderate mitral valve regurgitation, mild to moderate aortic valve regurgitation. He has mild dependent nonpitting bilateral lower extremity edema. Euvolemic and well compensated on exam.  Continue to monitor for progressive symptoms.  Consider repeat echocardiogram as clinically indicated.   4. Hypertension: BP is mildly elevated in office today, generally well-controlled.  He has not yet taken his medication this morning.  Continue to monitor BP and report BP consistently 130/80.  For now, continue current antihypertensive regimen.    5. Hyperlipidemia: LDL was 59 in 01/2023. Continue  Lipitor .    6. Disposition: Follow-up 2 weeks post cath. Follow-up as scheduled with Dr. Pietro in 11/2023.    Home Medications    Current Outpatient Medications  Medication Sig Dispense Refill   Acetylcarnitine HCl (ACETYL L-CARNITINE PO) Take 500 mg by mouth in the morning.     acidophilus (RISAQUAD) CAPS capsule Take 1 capsule by mouth every evening.     amLODipine  (NORVASC ) 10 MG tablet Take 1 tablet (10 mg total) by mouth in the morning. 30 tablet 0   aspirin  EC 81 MG tablet Take 1 tablet (81 mg total) by mouth daily. Swallow whole.     atorvastatin  (LIPITOR ) 80 MG tablet Take 1 tablet (80 mg total) by mouth every evening. 30 tablet 0   carvedilol  (COREG ) 3.125 MG tablet Take 1 tablet (3.125 mg total) by mouth 2 (two) times daily with a meal. 30 tablet 0   Cholecalciferol (VITAMIN D) 50 MCG (2000 UT) CAPS Take 2,000 Units by mouth in the morning.     Coenzyme Q10 (COQ-10) 100 MG CAPS Take 100 mg by mouth daily.     folic acid (FOLVITE) 1 MG tablet Take 1 mg by mouth daily.     methotrexate (RHEUMATREX) 2.5 MG tablet Take 20 mg by mouth every Thursday. In the morning.     Multiple Vitamin (MULTIVITAMIN WITH MINERALS) TABS tablet Take 1 tablet by mouth every evening.     nitroGLYCERIN  (NITROSTAT ) 0.4 MG SL tablet Place 1 tablet (0.4 mg total) under the tongue every 5 (five) minutes as needed for chest pain. 25 tablet 3   NOVOLOG  100 UNIT/ML injection INJECT UP TO 100 UNITS DAILY MAXIMUM DOSE WITH INSULIN  PUMP DAILY  1   Omega-3 Fatty Acids (OMEGA 3 PO) Take 1 capsule by mouth daily.     Polyvinyl Alcohol-Povidone (REFRESH OP) Place 1-2 drops into the right eye every 2 (two) hours as needed (dryness).     solifenacin  (VESICARE ) 10 MG tablet Take 1 tablet (10 mg total) by mouth daily. (Patient taking differently: Take 10 mg by mouth every evening.) 30 tablet 11   telmisartan  (MICARDIS ) 40 MG tablet Take 1 tablet (40 mg total) by mouth in the morning. 30 tablet  0   Vitamins-Lipotropics  (B-100 PO) Take 1 tablet by mouth in the morning.     No current facility-administered medications for this visit.   Facility-Administered Medications Ordered in Other Visits  Medication Dose Route Frequency Provider Last Rate Last Admin   water  for irrigation, sterile for irrigation SOLN    PRN Sherrilee Belvie CROME, MD   500 mL at 12/18/22 0810     Review of Systems    ***.  All other systems reviewed and are otherwise negative except as noted above.    Physical Exam    VS:  BP 127/67   Pulse 64   Ht 5' 9 (1.753 m)   Wt 174 lb 12.8 oz (79.3 kg)   SpO2 95%   BMI 25.81 kg/m  , BMI Body mass index is 25.81 kg/m.     GEN: Well nourished, well developed, in no acute distress. HEENT: normal. Neck: Supple, no JVD, carotid bruits, or masses. Cardiac: RRR, no murmurs, rubs, or gallops. No clubbing, cyanosis, edema.  Radials/DP/PT 2+ and equal bilaterally.  Respiratory:  Respirations regular and unlabored, clear to auscultation bilaterally. GI: Soft, nontender, nondistended, BS + x 4. MS: no deformity or atrophy. Skin: warm and dry, no rash. Neuro:  Strength and sensation are intact. Psych: Normal affect.  Accessory Clinical Findings    ECG personally reviewed by me today - EKG Interpretation Date/Time:  Friday October 29 2023 14:30:57 EDT Ventricular Rate:  64 PR Interval:  162 QRS Duration:  150 QT Interval:  432 QTC Calculation: 445 R Axis:   23  Text Interpretation: Normal sinus rhythm Left bundle branch block When compared with ECG of 29-Sep-2023 11:01, QRS axis Shifted right T wave inversion now evident in Inferior leads Confirmed by Daneen Perkins ([id number]) on 10/29/2023 2:40:12 PM  - no acute changes.   Lab Results  Component Value Date   WBC 14.3 (H) 09/29/2023   HGB 13.7 09/29/2023   HCT 40.4 09/29/2023   MCV 102 (H) 09/29/2023   PLT 284 09/29/2023   Lab Results  Component Value Date   CREATININE 0.71 (L) 09/07/2023   BUN 20 09/07/2023   NA 133 (L) 09/07/2023    K 4.9 09/07/2023   CL 97 09/07/2023   CO2 28 09/07/2023   Lab Results  Component Value Date   ALT 29 07/15/2023   AST 37 07/15/2023   ALKPHOS 78 07/15/2023   BILITOT 0.3 07/15/2023   No results found for: CHOL, HDL, LDLCALC, LDLDIRECT, TRIG, CHOLHDL  Lab Results  Component Value Date   HGBA1C 6.0 (H) 12/11/2022    Assessment & Plan    1.  ***      Perkins JAYSON Daneen, NP 10/29/2023, 2:40 PM

## 2023-11-01 ENCOUNTER — Encounter: Payer: Self-pay | Admitting: Nurse Practitioner

## 2023-11-03 ENCOUNTER — Ambulatory Visit

## 2023-11-03 NOTE — Progress Notes (Signed)
 Right foot dorsi flex 3/5 Left 0-1 dorsiflex  Patient was scanned today with reps from [employer] for BIL AFO When Items are in we will call patient for fitting  Lolita Schultze CPed, CFo, CFm

## 2023-11-09 DIAGNOSIS — Z85828 Personal history of other malignant neoplasm of skin: Secondary | ICD-10-CM | POA: Diagnosis not present

## 2023-11-09 DIAGNOSIS — L814 Other melanin hyperpigmentation: Secondary | ICD-10-CM | POA: Diagnosis not present

## 2023-11-09 DIAGNOSIS — L905 Scar conditions and fibrosis of skin: Secondary | ICD-10-CM | POA: Diagnosis not present

## 2023-11-09 DIAGNOSIS — Z08 Encounter for follow-up examination after completed treatment for malignant neoplasm: Secondary | ICD-10-CM | POA: Diagnosis not present

## 2023-11-09 DIAGNOSIS — D225 Melanocytic nevi of trunk: Secondary | ICD-10-CM | POA: Diagnosis not present

## 2023-11-09 DIAGNOSIS — L821 Other seborrheic keratosis: Secondary | ICD-10-CM | POA: Diagnosis not present

## 2023-11-09 DIAGNOSIS — L570 Actinic keratosis: Secondary | ICD-10-CM | POA: Diagnosis not present

## 2023-11-09 MED ORDER — ISOSORBIDE MONONITRATE ER 30 MG PO TB24: 15.0000 mg | ORAL_TABLET | Freq: Every day | ORAL | Status: DC

## 2023-11-12 ENCOUNTER — Ambulatory Visit (INDEPENDENT_AMBULATORY_CARE_PROVIDER_SITE_OTHER): Payer: Medicare Other | Admitting: Podiatry

## 2023-11-12 DIAGNOSIS — R2689 Other abnormalities of gait and mobility: Secondary | ICD-10-CM | POA: Diagnosis not present

## 2023-11-12 DIAGNOSIS — L853 Xerosis cutis: Secondary | ICD-10-CM

## 2023-11-12 DIAGNOSIS — M21372 Foot drop, left foot: Secondary | ICD-10-CM

## 2023-11-12 DIAGNOSIS — M21371 Foot drop, right foot: Secondary | ICD-10-CM | POA: Diagnosis not present

## 2023-11-12 MED ORDER — AMMONIUM LACTATE 12 % EX LOTN: 1.0000 | TOPICAL_LOTION | CUTANEOUS | 0 refills | Status: AC | PRN

## 2023-11-12 NOTE — Progress Notes (Signed)
 Subjective:  Patient ID: Christopher Conner, male    DOB: [birth year],  MRN: [id number]  Chief Complaint  Patient presents with   Bilateral foot-drop    Pt stated that his skin is very dry no pain or discomfort     73 y.o. male presents with the above complaint.  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  Patient states that he wanted to discuss treatment options for his dropfoot if any.  He is a diabetic with history of CLL.  He states been dealing with this for quite some time has been wearing the dropfoot bracing but is still causing him to trip because his foot is not clearing the ground.  He denies any other acute complaints.  He denies any other issues.   Review of Systems: Negative except as noted in the HPI. Denies N/V/F/Ch.  Past Medical History:  Diagnosis Date   Anemia 07/15/2023   Arthritis    Bilateral foot-drop    after back surgery   BPH (benign prostatic hyperplasia)    Cataract    Chronic lymphocytic leukemia (HCC) early 2018   no current treatment   CLL (chronic lymphocytic leukemia) (HCC)    DM type 1 (diabetes mellitus, type 1) (HCC)    type 1   Goals of care, counseling/discussion 07/18/2023   High cholesterol    Hypertension    Murmur, cardiac 07/15/2023   Neuromuscular disorder (HCC)    diabetic neuropathy   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   Scattered rhonchi of right lung 11/22/2022    Current Outpatient Medications:    Acetylcarnitine HCl (ACETYL L-CARNITINE PO), Take 500 mg by mouth in the morning., Disp: , Rfl:    acidophilus (RISAQUAD) CAPS capsule, Take 1 capsule by mouth every evening., Disp: , Rfl:    amLODipine  (NORVASC ) 10 MG tablet, Take 1 tablet (10 mg total) by mouth in the morning., Disp: 30 tablet, Rfl: 0   aspirin  EC 81 MG tablet, Take 1 tablet (81 mg total) by mouth daily. Swallow whole., Disp: , Rfl:    atorvastatin  (LIPITOR ) 80 MG tablet, Take 1 tablet  (80 mg total) by mouth every evening., Disp: 30 tablet, Rfl: 0   carvedilol  (COREG ) 3.125 MG tablet, Take 1 tablet (3.125 mg total) by mouth 2 (two) times daily with a meal., Disp: 30 tablet, Rfl: 0   Cholecalciferol (VITAMIN D) 50 MCG (2000 UT) CAPS, Take 2,000 Units by mouth in the morning., Disp: , Rfl:    Coenzyme Q10 (COQ-10) 100 MG CAPS, Take 100 mg by mouth daily., Disp: , Rfl:    folic acid (FOLVITE) 1 MG tablet, Take 1 mg by mouth daily., Disp: , Rfl:    isosorbide  mononitrate (IMDUR ) 30 MG 24 hr tablet, Take 0.5 tablets (15 mg total) by mouth daily., Disp: , Rfl:    methotrexate (RHEUMATREX) 2.5 MG tablet, Take 20 mg by mouth every Thursday. In the morning., Disp: , Rfl:    Multiple Vitamin (MULTIVITAMIN WITH MINERALS) TABS tablet, Take 1 tablet by mouth every evening., Disp: , Rfl:    nitroGLYCERIN  (NITROSTAT ) 0.4 MG SL tablet, Place 1 tablet (0.4 mg total) under the tongue every 5 (five) minutes as needed for chest pain., Disp: 25 tablet, Rfl: 3   NOVOLOG  100 UNIT/ML injection, INJECT UP TO 100 UNITS DAILY MAXIMUM DOSE WITH INSULIN  PUMP DAILY, Disp: , Rfl: 1   Omega-3 Fatty Acids (OMEGA 3 PO), Take 1 capsule by  mouth daily., Disp: , Rfl:    Polyvinyl Alcohol-Povidone (REFRESH OP), Place 1-2 drops into the right eye every 2 (two) hours as needed (dryness)., Disp: , Rfl:    solifenacin  (VESICARE ) 10 MG tablet, Take 1 tablet (10 mg total) by mouth daily. (Patient taking differently: Take 10 mg by mouth every evening.), Disp: 30 tablet, Rfl: 11   telmisartan  (MICARDIS ) 40 MG tablet, Take 1 tablet (40 mg total) by mouth in the morning., Disp: 30 tablet, Rfl: 0   Vitamins-Lipotropics (B-100 PO), Take 1 tablet by mouth in the morning., Disp: , Rfl:  No current facility-administered medications for this visit.  Facility-Administered Medications Ordered in Other Visits:    water  for irrigation, sterile for irrigation SOLN, , , PRN, McKenzie, Belvie CROME, MD, 500 mL at 12/18/22 9189  Social  History   Tobacco Use  Smoking Status Never  Smokeless Tobacco Never    Allergies  Allergen Reactions   Lisinopril  Cough   Latex Itching and Rash   Objective:  There were no vitals filed for this visit. There is no height or weight on file to calculate BMI. Constitutional Well developed. Well nourished.  Vascular Dorsalis pedis pulses palpable bilaterally. Posterior tibial pulses palpable bilaterally. Capillary refill normal to all digits.  No cyanosis or clubbing noted. Pedal hair growth normal.  Neurologic Normal speech. Oriented to person, place, and time. Epicritic sensation to light touch grossly present bilaterally.  Dermatologic Nails well groomed and normal in appearance. No open wounds. No skin lesions.  Orthopedic: Bilateral foot drop noted with weakness of the extensor tendon flexor tendons are 5 out of 5 bilaterally.  Bilateral dropfoot noted.   Radiographs: None Assessment:   No diagnosis found.  Plan:  Patient was evaluated and treated and all questions answered.  Bilateral foot drop likely from back surgery with history of multiple fall -All of the concerns were discussed with the patient in extensive detail -Given the amount of dropfoot that is present he will build he will benefit from possibly more than balance bracing as he is also having a history of fall and antalgic gait due to it.  Dropfoot bracing/AFO bracing's are not giving him enough stability. -I believe she will benefit from seeing the orthotics department for bracing to help with the balance and prevent falls such as Moore balance bracing. -At this time I discussed with the patient that he is not an ideal surgical candidate as there are not a good alternative procedures for rigid dropfoot.  I discussed with patient he states understanding would like to hold off on surgical procedure -Ammonium lactate was sent for some dry skin -Patient is awaiting Moore balance bracing

## 2023-11-16 NOTE — Progress Notes (Signed)
 HPI: Follow-up coronary artery disease.  Patient seen with dyspnea May 2025.  Abdominal ultrasound May 2025 showed no aneurysm.  Echocardiogram showed normal LV function, grade 1 diastolic dysfunction, mild to moderate left atrial enlargement, mild right atrial enlargement, moderate mitral regurgitation, mild to moderate aortic insufficiency.  Coronary CTA showed calcium  score 871 which is 80th percentile and moderate disease in the circumflex, right coronary artery, LAD and second diagonal.  FFR showed possible flow-limiting lesions in the LAD and right coronary artery.  Cardiac catheterization June 2025 showed 60% mid LAD and normal LV function.  Medical therapy recommended.  Since last seen patient has some dyspnea on exertion unchanged.  Occasional mild pedal edema controlled with compression hose.  No chest pain or syncope.  He did state that he was having some dizziness when his blood pressure was running low.  Current Outpatient Medications  Medication Sig Dispense Refill   Acetylcarnitine HCl (ACETYL L-CARNITINE PO) Take 500 mg by mouth in the morning.     acidophilus (RISAQUAD) CAPS capsule Take 1 capsule by mouth every evening.     amLODipine  (NORVASC ) 10 MG tablet Take 1 tablet (10 mg total) by mouth in the morning. 30 tablet 0   ammonium lactate  (AMLACTIN DAILY) 12 % lotion Apply 1 Application topically as needed. 400 g 0   aspirin  EC 81 MG tablet Take 1 tablet (81 mg total) by mouth daily. Swallow whole.     atorvastatin  (LIPITOR ) 80 MG tablet Take 1 tablet (80 mg total) by mouth every evening. 30 tablet 0   carvedilol  (COREG ) 3.125 MG tablet Take 1 tablet (3.125 mg total) by mouth 2 (two) times daily with a meal. 30 tablet 0   Cholecalciferol (VITAMIN D) 50 MCG (2000 UT) CAPS Take 2,000 Units by mouth in the morning.     Coenzyme Q10 (COQ-10) 100 MG CAPS Take 100 mg by mouth daily.     folic acid (FOLVITE) 1 MG tablet Take 1 mg by mouth daily.     methotrexate (RHEUMATREX) 2.5 MG  tablet Take 20 mg by mouth every Thursday. In the morning.     Multiple Vitamin (MULTIVITAMIN WITH MINERALS) TABS tablet Take 1 tablet by mouth every evening.     nitroGLYCERIN  (NITROSTAT ) 0.4 MG SL tablet Place 1 tablet (0.4 mg total) under the tongue every 5 (five) minutes as needed for chest pain. 25 tablet 3   NOVOLOG  100 UNIT/ML injection INJECT UP TO 100 UNITS DAILY MAXIMUM DOSE WITH INSULIN  PUMP DAILY  1   Omega-3 Fatty Acids (OMEGA 3 PO) Take 1 capsule by mouth daily.     Polyvinyl Alcohol-Povidone (REFRESH OP) Place 1-2 drops into the right eye every 2 (two) hours as needed (dryness).     solifenacin  (VESICARE ) 10 MG tablet Take 1 tablet (10 mg total) by mouth daily. (Patient taking differently: Take 10 mg by mouth every evening.) 30 tablet 11   telmisartan  (MICARDIS ) 40 MG tablet Take 1 tablet (40 mg total) by mouth in the morning. 30 tablet 0   Vitamins-Lipotropics (B-100 PO) Take 1 tablet by mouth in the morning.     No current facility-administered medications for this visit.   Facility-Administered Medications Ordered in Other Visits  Medication Dose Route Frequency Provider Last Rate Last Admin   water  for irrigation, sterile for irrigation SOLN    PRN Sherrilee Belvie CROME, MD   500 mL at 12/18/22 0810     Past Medical History:  Diagnosis Date   Anemia 07/15/2023  Arthritis    Bilateral foot-drop    after back surgery   BPH (benign prostatic hyperplasia)    Cataract    Chronic lymphocytic leukemia (HCC) early 2018   no current treatment   CLL (chronic lymphocytic leukemia) (HCC)    DM type 1 (diabetes mellitus, type 1) (HCC)    type 1   Goals of care, counseling/discussion 07/18/2023   High cholesterol    Hypertension    Murmur, cardiac 07/15/2023   Neuromuscular disorder (HCC)    diabetic neuropathy   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   Scattered rhonchi of right lung 11/22/2022    Past Surgical History:  Procedure Laterality Date    APPENDECTOMY  early 20's   BACK SURGERY     BRAIN SURGERY  10/30/04   brain shunt, [facility]   CATARACT EXTRACTION Bilateral    left eye prostheisi shell   CYST REMOVAL NECK  yrs ago   CYSTOSCOPY [name] 04/22/2023   Procedure: CYSTOSCOPY;  Surgeon: Sherrilee Belvie CROME, MD;  Location: [facility];  Service: Urology;  Laterality: [name];   CYSTOSCOPY WITH INJECTION [name] 12/17/2022   Procedure: CYSTOSCOPY WITH INJECTION-Botox ;  Surgeon: Sherrilee Belvie CROME, MD;  Location: [facility];  Service: Urology;  Laterality: [name];   CYSTOSCOPY WITH INSERTION OF UROLIFT  april/march 2020   done in office   CYSTOSCOPY WITH INSERTION OF UROLIFT [name] 10/12/2019   Procedure: CYSTOSCOPY WITH INSERTION OF UROLIFT;  Surgeon: Sherrilee Belvie CROME, MD;  Location: [facility];  Service: Urology;  Laterality: [name];   EYE SURGERY     LEFT HEART CATH AND CORONARY ANGIOGRAPHY [name] 10/05/2023   Procedure: LEFT HEART CATH AND CORONARY ANGIOGRAPHY;  Surgeon: [place], Peter M, MD;  Location: [facility];  Service: Cardiovascular;  Laterality: [name];   LUMBAR FUSION  1998 and 2000   l5 to s1 1998, then l4 and l5 in 2000   SPINE SURGERY     H. Elsner, L4-5 & L5-S1   TRANSURETHRAL RESECTION OF PROSTATE [name] 04/22/2023   Procedure: TRANSURETHRAL RESECTION OF THE PROSTATE (TURP);  Surgeon: Sherrilee Belvie CROME, MD;  Location: [facility];  Service: Urology;  Laterality: [name];   VENTRICULOPERITONEAL SHUNT  2006    Social History   Socioeconomic History   Marital status: Married    Spouse name: Not on file   Number of children: Not on file   Years of education: Not on file   Highest education level: Not on file  Occupational History   Occupation: Engineer, structural    Comment: Management consultant (employer)  Tobacco Use   Smoking status: Never   Smokeless tobacco: Never  Vaping Use   Vaping status: Never Used  Substance and Sexual Activity   Alcohol use: No   Drug use: No   Sexual activity: Not Currently  Other  Topics Concern   Not on file  Social History Narrative   Not on file   Social Drivers of Health   Financial Resource Strain: Low Risk  (07/06/2023)   Overall Financial Resource Strain (CARDIA)    Difficulty of Paying Living Expenses: Not hard at all  Food Insecurity: No Food Insecurity (07/06/2023)   Hunger Vital Sign    Worried About Running Out of Food in the Last Year: Never true    Ran Out of Food in the Last Year: Never true  Transportation Needs: No Transportation Needs (07/06/2023)   PRAPARE - Administrator, Civil Service (Medical):  No    Lack of Transportation (Non-Medical): No  Physical Activity: Inactive (07/06/2023)   Exercise Vital Sign    Days of Exercise per Week: 0 days    Minutes of Exercise per Session: 0 min  Stress: No Stress Concern Present (07/06/2023)   [employer] of Occupational Health - Occupational Stress Questionnaire    Feeling of Stress : Not at all  Social Connections: Socially Isolated (07/06/2023)   Social Connection and Isolation Panel    Frequency of Communication with Friends and Family: Once a week    Frequency of Social Gatherings with Friends and Family: Once a week    Attends Religious Services: Never    Database administrator or Organizations: No    Attends Banker Meetings: Never    Marital Status: Married  Catering manager Violence: Not At Risk (07/06/2023)   Humiliation, Afraid, Rape, and Kick questionnaire    Fear of Current or Ex-Partner: No    Emotionally Abused: No    Physically Abused: No    Sexually Abused: No    Family History  Problem Relation Age of Onset   Hypertension Mother    Hyperlipidemia Mother    Diabetes Mother    Diabetes Father     ROS: no fevers or chills, productive cough, hemoptysis, dysphasia, odynophagia, melena, hematochezia, dysuria, hematuria, rash, seizure activity, orthopnea, PND, claudication. Remaining systems are negative.  Physical Exam: Well-developed  well-nourished in no acute distress.  Skin is warm and dry.  HEENT is normal.  Neck is supple.  Chest is clear to auscultation with normal expansion.  Cardiovascular exam is regular rate and rhythm.  Abdominal exam nontender or distended. No masses palpated. Extremities show no edema. neuro grossly intact  A/P  1 coronary artery disease-moderate LAD stenosis on recent catheterization.  Continue medical therapy with aspirin  and statin.  2 hyperlipidemia-continue statin.  3 hypertension-patient's blood pressure is elevated; however he follows this at home and his systolic is in the 130-140 range.  He has had some dizziness when it is lower.  Will follow and advance medications if needed.  4 mitral regurgitation/aortic insufficiency-will plan follow-up echocardiogram May 2026.  5 left bundle branch block  Redell Shallow, MD

## 2023-11-30 ENCOUNTER — Encounter: Payer: Self-pay | Admitting: Cardiology

## 2023-11-30 ENCOUNTER — Ambulatory Visit: Attending: Cardiology | Admitting: Cardiology

## 2023-11-30 ENCOUNTER — Ambulatory Visit (INDEPENDENT_AMBULATORY_CARE_PROVIDER_SITE_OTHER)

## 2023-11-30 VITALS — BP 147/74 | HR 64 | Ht 69.0 in | Wt 174.0 lb

## 2023-11-30 DIAGNOSIS — Z9181 History of falling: Secondary | ICD-10-CM | POA: Diagnosis not present

## 2023-11-30 DIAGNOSIS — E785 Hyperlipidemia, unspecified: Secondary | ICD-10-CM | POA: Diagnosis not present

## 2023-11-30 DIAGNOSIS — I10 Essential (primary) hypertension: Secondary | ICD-10-CM | POA: Insufficient documentation

## 2023-11-30 DIAGNOSIS — I340 Nonrheumatic mitral (valve) insufficiency: Secondary | ICD-10-CM | POA: Insufficient documentation

## 2023-11-30 DIAGNOSIS — R2689 Other abnormalities of gait and mobility: Secondary | ICD-10-CM | POA: Diagnosis not present

## 2023-11-30 DIAGNOSIS — M21371 Foot drop, right foot: Secondary | ICD-10-CM

## 2023-11-30 DIAGNOSIS — M21372 Foot drop, left foot: Secondary | ICD-10-CM | POA: Diagnosis not present

## 2023-11-30 DIAGNOSIS — I447 Left bundle-branch block, unspecified: Secondary | ICD-10-CM | POA: Diagnosis not present

## 2023-11-30 DIAGNOSIS — I2510 Atherosclerotic heart disease of native coronary artery without angina pectoris: Secondary | ICD-10-CM | POA: Diagnosis not present

## 2023-11-30 DIAGNOSIS — I351 Nonrheumatic aortic (valve) insufficiency: Secondary | ICD-10-CM | POA: Diagnosis not present

## 2023-11-30 NOTE — Progress Notes (Signed)
 Patient presents today to pick up custom molded AFO's Bilateral, diagnosed with Bilateral Foot drop from Severe Neuropathy  Orthosis' were dispensed and fit was good patient was very happy with function. Brace adds support and stability to ankle in sagittal plane of motion Brace increases propulsion and toe off helping to assist patient in ambulation Patient reviewed with me instructions for break-in and wear. Written instructions given to patient.  Patient will follow up as needed.   Lolita Schultze Cped, CFo, CFm

## 2023-11-30 NOTE — Patient Instructions (Signed)
 Medication Instructions:  No Changes *If you need a refill on your cardiac medications before your next appointment, please call your pharmacy*  Lab Work: None  Follow-Up: At [facility], you and your health needs are our priority.  As part of our continuing mission to provide you with exceptional heart care, our providers are all part of one team.  This team includes your primary Cardiologist (physician) and Advanced Practice Providers or APPs (Physician Assistants and Nurse Practitioners) who all work together to provide you with the care you need, when you need it.  Your next appointment:   6 month(s)  Provider:   Redell Shallow, MD    Other Instructions Please call us  or send a MyChart message with any Cardiology related questions/concerns.  [phone].  Thank you!

## 2023-12-07 NOTE — Progress Notes (Signed)
 NA

## 2023-12-13 ENCOUNTER — Encounter: Payer: Self-pay | Admitting: Urology

## 2023-12-13 ENCOUNTER — Ambulatory Visit (INDEPENDENT_AMBULATORY_CARE_PROVIDER_SITE_OTHER): Admitting: Urology

## 2023-12-13 VITALS — BP 120/54 | HR 63

## 2023-12-13 DIAGNOSIS — R350 Frequency of micturition: Secondary | ICD-10-CM

## 2023-12-13 DIAGNOSIS — R351 Nocturia: Secondary | ICD-10-CM

## 2023-12-13 DIAGNOSIS — N400 Enlarged prostate without lower urinary tract symptoms: Secondary | ICD-10-CM

## 2023-12-13 LAB — URINALYSIS, ROUTINE W REFLEX MICROSCOPIC
Bilirubin, UA: NEGATIVE
Glucose, UA: NEGATIVE
Ketones, UA: NEGATIVE
Leukocytes,UA: NEGATIVE
Nitrite, UA: NEGATIVE
Protein,UA: NEGATIVE
RBC, UA: NEGATIVE
Specific Gravity, UA: 1.02 (ref 1.005–1.030)
Urobilinogen, Ur: 0.2 mg/dL (ref 0.2–1.0)
pH, UA: 6 (ref 5.0–7.5)

## 2023-12-13 LAB — BLADDER SCAN AMB NON-IMAGING: Scan Result: 5

## 2023-12-13 MED ORDER — FESOTERODINE FUMARATE ER 8 MG PO TB24: 8.0000 mg | ORAL_TABLET | Freq: Every day | ORAL | 4 refills | Status: DC

## 2023-12-13 MED ORDER — HYDROXYZINE HCL 10 MG PO TABS: 10.0000 mg | ORAL_TABLET | Freq: Every day | ORAL | 11 refills | Status: DC

## 2023-12-13 NOTE — Patient Instructions (Signed)

## 2023-12-13 NOTE — Progress Notes (Signed)
 Bladder Scan completed today.  Patient can void prior to the bladder scan. Bladder scan result: 5  Performed By: Lorrayne Crumble

## 2023-12-13 NOTE — Progress Notes (Signed)
 12/13/2023 2:23 PM   Christopher Conner [birth year] [id number]  Referring provider: Oris Camie BRAVO, NP [address] [place],  [place] [address]  Followup urinary frequency   HPI: Christopher Conner is a 72yo here for followup for urinary urgency and urinary frequency. IPSS 17 QOL 4 after TURP. His urinary frequency is unimproved with vesicare . He has urinary frequency every 1-2 hours and nocturia 3x. Urine stream is strong at the start of urination and then the stream weakens at the end of his urination. PVR 5cc. He has tried intravesical botox , PTNS and anticholinergivcs/beta3s   PMH: Past Medical History:  Diagnosis Date   Anemia 07/15/2023   Arthritis    Bilateral foot-drop    after back surgery   BPH (benign prostatic hyperplasia)    Cataract    Chronic lymphocytic leukemia (HCC) early 2018   no current treatment   CLL (chronic lymphocytic leukemia) (HCC)    DM type 1 (diabetes mellitus, type 1) (HCC)    type 1   Goals of care, counseling/discussion 07/18/2023   High cholesterol    Hypertension    Murmur, cardiac 07/15/2023   Neuromuscular disorder (HCC)    diabetic neuropathy   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   Scattered rhonchi of right lung 11/22/2022    Surgical History: Past Surgical History:  Procedure Laterality Date   APPENDECTOMY  early 20's   BACK SURGERY     BRAIN SURGERY  10/30/04   brain shunt, Codman Hakim   CATARACT EXTRACTION Bilateral    left eye prostheisi shell   CYST REMOVAL NECK  yrs ago   CYSTOSCOPY [name] 04/22/2023   Procedure: CYSTOSCOPY;  Surgeon: Sherrilee Belvie CROME, MD;  Location: [facility];  Service: Urology;  Laterality: [name];   CYSTOSCOPY WITH INJECTION [name] 12/17/2022   Procedure: CYSTOSCOPY WITH INJECTION-Botox ;  Surgeon: Sherrilee Belvie CROME, MD;  Location: [facility];  Service: Urology;  Laterality: [name];   CYSTOSCOPY WITH INSERTION OF UROLIFT  april/march 2020   done in office   CYSTOSCOPY WITH INSERTION OF  UROLIFT [name] 10/12/2019   Procedure: CYSTOSCOPY WITH INSERTION OF UROLIFT;  Surgeon: Sherrilee Belvie CROME, MD;  Location: [facility];  Service: Urology;  Laterality: [name];   EYE SURGERY     LEFT HEART CATH AND CORONARY ANGIOGRAPHY [name] 10/05/2023   Procedure: LEFT HEART CATH AND CORONARY ANGIOGRAPHY;  Surgeon: Swaziland, Peter M, MD;  Location: [facility] INVASIVE CV LAB;  Service: Cardiovascular;  Laterality: [name];   LUMBAR FUSION  1998 and 2000   l5 to s1 1998, then l4 and l5 in 2000   SPINE SURGERY     H. Elsner, L4-5 & L5-S1   TRANSURETHRAL RESECTION OF PROSTATE [name] 04/22/2023   Procedure: TRANSURETHRAL RESECTION OF THE PROSTATE (TURP);  Surgeon: Sherrilee Belvie CROME, MD;  Location: [facility];  Service: Urology;  Laterality: [name];   VENTRICULOPERITONEAL SHUNT  2006    Home Medications:  Allergies as of 12/13/2023       Reactions   Lisinopril  Cough   Latex Itching, Rash        Medication List        Accurate as of December 13, 2023  2:23 PM. If you have any questions, ask your nurse or doctor.          ACETYL L-CARNITINE PO Take 500 mg by mouth in the morning.   acidophilus Caps capsule Take 1 capsule by mouth every evening.   amLODipine  10 MG tablet Commonly  known as: NORVASC  Take 1 tablet (10 mg total) by mouth in the morning.   ammonium lactate  12 % lotion Commonly known as: Amlactin Daily Apply 1 Application topically as needed.   aspirin  EC 81 MG tablet Take 1 tablet (81 mg total) by mouth daily. Swallow whole.   atorvastatin  80 MG tablet Commonly known as: LIPITOR  Take 1 tablet (80 mg total) by mouth every evening.   B-100 PO Take 1 tablet by mouth in the morning.   carvedilol  3.125 MG tablet Commonly known as: COREG  Take 1 tablet (3.125 mg total) by mouth 2 (two) times daily with a meal.   CoQ-10 100 MG Caps Take 100 mg by mouth daily.   folic acid 1 MG tablet Commonly known as: FOLVITE Take 1 mg by mouth daily.   methotrexate 2.5 MG  tablet Commonly known as: RHEUMATREX Take 20 mg by mouth every Thursday. In the morning.   multivitamin with minerals Tabs tablet Take 1 tablet by mouth every evening.   nitroGLYCERIN  0.4 MG SL tablet Commonly known as: NITROSTAT  Place 1 tablet (0.4 mg total) under the tongue every 5 (five) minutes as needed for chest pain.   NovoLOG  100 UNIT/ML injection Generic drug: insulin  aspart INJECT UP TO 100 UNITS DAILY MAXIMUM DOSE WITH INSULIN  PUMP DAILY   OMEGA 3 PO Take 1 capsule by mouth daily.   REFRESH OP Place 1-2 drops into the right eye every 2 (two) hours as needed (dryness).   solifenacin  10 MG tablet Commonly known as: VESICARE  Take 1 tablet (10 mg total) by mouth daily. What changed: when to take this   telmisartan  40 MG tablet Commonly known as: MICARDIS  Take 1 tablet (40 mg total) by mouth in the morning.   Vitamin D 50 MCG (2000 UT) Caps Take 2,000 Units by mouth in the morning.        Allergies:  Allergies  Allergen Reactions   Lisinopril  Cough   Latex Itching and Rash    Family History: Family History  Problem Relation Age of Onset   Hypertension Mother    Hyperlipidemia Mother    Diabetes Mother    Diabetes Father     Social History:  reports that he has never smoked. He has never used smokeless tobacco. He reports that he does not drink alcohol and does not use drugs.  ROS: All other review of systems were reviewed and are negative except what is noted above in HPI  Physical Exam: BP (!) 120/54   Pulse 63   Constitutional:  Alert and oriented, No acute distress. HEENT: [place] AT, moist mucus membranes.  Trachea midline, no masses. Cardiovascular: No clubbing, cyanosis, or edema. Respiratory: Normal respiratory effort, no increased work of breathing. GI: Abdomen is soft, nontender, nondistended, no abdominal masses GU: No CVA tenderness.  Lymph: No cervical or inguinal lymphadenopathy. Skin: No rashes, bruises or suspicious  lesions. Neurologic: Grossly intact, no focal deficits, moving all 4 extremities. Psychiatric: Normal mood and affect.  Laboratory Data: Lab Results  Component Value Date   WBC 14.3 (H) 09/29/2023   HGB 13.7 09/29/2023   HCT 40.4 09/29/2023   MCV 102 (H) 09/29/2023   PLT 284 09/29/2023    Lab Results  Component Value Date   CREATININE 0.71 (L) 09/07/2023    No results found for: PSA  No results found for: TESTOSTERONE  Lab Results  Component Value Date   HGBA1C 6.0 (H) 12/11/2022    Urinalysis    Component Value Date/Time   COLORURINE  YELLOW 08/16/2015 0244   APPEARANCEUR Clear 09/01/2023 1419   LABSPEC 1.029 08/16/2015 0244   PHURINE 5.5 08/16/2015 0244   GLUCOSEU Negative 09/01/2023 1419   HGBUR NEGATIVE 08/16/2015 0244   BILIRUBINUR Negative 09/01/2023 1419   KETONESUR >80 (A) 08/16/2015 0244   PROTEINUR Negative 09/01/2023 1419   PROTEINUR NEGATIVE 08/16/2015 0244   UROBILINOGEN 0.2 04/09/2014 1947   NITRITE Negative 09/01/2023 1419   NITRITE NEGATIVE 08/16/2015 0244   LEUKOCYTESUR Negative 09/01/2023 1419    Lab Results  Component Value Date   LABMICR Comment 09/01/2023   WBCUA >30 (A) 06/02/2023   LABEPIT 0-10 06/02/2023   BACTERIA Few (A) 06/02/2023    Pertinent Imaging:  Results for orders placed during the hospital encounter of 05/23/10  DG Abd 1 View  Narrative *RADIOLOGY REPORT*  Clinical Data: Weakness with nausea and vomiting.  ABDOMEN - 1 VIEW  Comparison: 02/10/2010.  Findings: Ventriculoperitoneal shunt tubing extends into the right mid abdomen.  There is no shunt discontinuity.  Moderate stool is noted throughout the colon.  The bowel gas pattern is nonobstructive.  There are postsurgical changes of the lower lumbar spine status post fusion.  IMPRESSION:  1.  Moderate stool throughout the colon suggesting constipation. 2.  Intact ventricular peritoneal shunt.  Original Report Authenticated By: ELSIE WENDI PERONE,  M.D.  No results found for this or any previous visit.  No results found for this or any previous visit.  No results found for this or any previous visit.  No results found for this or any previous visit.  No results found for this or any previous visit.  No results found for this or any previous visit.  No results found for this or any previous visit.   Assessment & Plan:    1. Benign prostatic hyperplasia, unspecified whether lower urinary tract symptoms present (Primary) Improved after TURP - Urinalysis, Routine w reflex microscopic - BLADDER SCAN AMB NON-IMAGING  2. Urinary frequency We will trial hydroxyzine  10mg  qhs  3. Nocturia Toviaz  8mg  daily   No follow-ups on file.  [name], MD  [facility] [place]

## 2023-12-15 ENCOUNTER — Other Ambulatory Visit: Payer: Self-pay

## 2023-12-15 NOTE — Progress Notes (Signed)
 Medication prior authorization request received.  Completed PA request through cover my meds for drug Hydroxyzine . KEY: [id number]  Approved: Pending

## 2023-12-15 NOTE — Progress Notes (Signed)
 Medication prior authorization request received.  Completed PA request through cover my meds for drug Fesoterodine . KEY: [id number]  Approved: Pending

## 2023-12-28 ENCOUNTER — Encounter: Payer: Self-pay | Admitting: Urology

## 2024-01-04 DIAGNOSIS — L409 Psoriasis, unspecified: Secondary | ICD-10-CM | POA: Diagnosis not present

## 2024-01-04 DIAGNOSIS — C9190 Lymphoid leukemia, unspecified not having achieved remission: Secondary | ICD-10-CM | POA: Diagnosis not present

## 2024-01-04 DIAGNOSIS — M79643 Pain in unspecified hand: Secondary | ICD-10-CM | POA: Diagnosis not present

## 2024-01-04 DIAGNOSIS — E7800 Pure hypercholesterolemia, unspecified: Secondary | ICD-10-CM | POA: Diagnosis not present

## 2024-01-04 DIAGNOSIS — M1990 Unspecified osteoarthritis, unspecified site: Secondary | ICD-10-CM | POA: Diagnosis not present

## 2024-01-04 DIAGNOSIS — M79646 Pain in unspecified finger(s): Secondary | ICD-10-CM | POA: Diagnosis not present

## 2024-01-04 DIAGNOSIS — E1021 Type 1 diabetes mellitus with diabetic nephropathy: Secondary | ICD-10-CM | POA: Diagnosis not present

## 2024-01-04 DIAGNOSIS — M7989 Other specified soft tissue disorders: Secondary | ICD-10-CM | POA: Diagnosis not present

## 2024-01-04 DIAGNOSIS — R931 Abnormal findings on diagnostic imaging of heart and coronary circulation: Secondary | ICD-10-CM | POA: Diagnosis not present

## 2024-01-04 DIAGNOSIS — Z79899 Other long term (current) drug therapy: Secondary | ICD-10-CM | POA: Diagnosis not present

## 2024-01-04 DIAGNOSIS — I10 Essential (primary) hypertension: Secondary | ICD-10-CM | POA: Diagnosis not present

## 2024-01-04 DIAGNOSIS — E871 Hypo-osmolality and hyponatremia: Secondary | ICD-10-CM | POA: Diagnosis not present

## 2024-01-04 DIAGNOSIS — Z9641 Presence of insulin pump (external) (internal): Secondary | ICD-10-CM | POA: Diagnosis not present

## 2024-01-04 DIAGNOSIS — L4050 Arthropathic psoriasis, unspecified: Secondary | ICD-10-CM | POA: Diagnosis not present

## 2024-01-05 ENCOUNTER — Telehealth: Payer: Self-pay

## 2024-01-05 ENCOUNTER — Encounter: Payer: Self-pay | Admitting: Urology

## 2024-01-05 NOTE — Telephone Encounter (Signed)
 Pt called would like a copy of ABN. I placed abn at the front desk

## 2024-01-06 ENCOUNTER — Telehealth: Payer: Self-pay | Admitting: Podiatry

## 2024-01-06 NOTE — Telephone Encounter (Signed)
 Patient came by on 01/05/2024 too sighn ABN for orthotics and came by on 01/06/2024 to pick up copy of signed ABN

## 2024-01-06 NOTE — Telephone Encounter (Signed)
 Patient called I asking for a copy of telephone call note 01/06/2024 by Arland Frost. Sent to his new email address [email or web address]

## 2024-01-10 ENCOUNTER — Telehealth: Payer: Self-pay | Admitting: Urology

## 2024-01-10 ENCOUNTER — Other Ambulatory Visit (HOSPITAL_COMMUNITY): Payer: Self-pay | Admitting: Endocrinology

## 2024-01-10 DIAGNOSIS — E1021 Type 1 diabetes mellitus with diabetic nephropathy: Secondary | ICD-10-CM

## 2024-01-10 NOTE — Telephone Encounter (Signed)
 Patient called into the office today with general questions/concerns regarding procedure he needs done. Wanting to get it soon. He is having issues with urinary tract Patient may be reached at [phone] to discuss questions.

## 2024-01-10 NOTE — Telephone Encounter (Signed)
 Pt is made aware that he can have inters tim and Exso and he need something ASAP. Pt is aware a message will be sent to Md, McKenzie. Verbalized understanding.

## 2024-01-13 NOTE — Telephone Encounter (Signed)
 Patient agreed to surgery dates of 02/10/2024 and 02/24/2024.  Please provide surgical posting for each.  Thank you

## 2024-01-17 ENCOUNTER — Encounter (HOSPITAL_COMMUNITY)
Admission: RE | Admit: 2024-01-17 | Discharge: 2024-01-17 | Disposition: A | Discharge status: Home / Self Care | Source: Ambulatory Visit | Attending: Endocrinology | Admitting: Endocrinology

## 2024-01-17 DIAGNOSIS — R140 Abdominal distension (gaseous): Secondary | ICD-10-CM | POA: Diagnosis not present

## 2024-01-17 DIAGNOSIS — R634 Abnormal weight loss: Secondary | ICD-10-CM | POA: Diagnosis not present

## 2024-01-17 DIAGNOSIS — E1021 Type 1 diabetes mellitus with diabetic nephropathy: Secondary | ICD-10-CM | POA: Insufficient documentation

## 2024-01-17 DIAGNOSIS — E119 Type 2 diabetes mellitus without complications: Secondary | ICD-10-CM | POA: Diagnosis not present

## 2024-01-17 MED ORDER — TECHNETIUM TC 99M SULFUR COLLOID
2.0000 | Freq: Once | INTRAVENOUS | Status: AC | PRN
Start: 2024-01-17 — End: 2024-01-17
  Administered 2024-01-17: 2.1 via INTRAVENOUS

## 2024-01-21 DIAGNOSIS — Z23 Encounter for immunization: Secondary | ICD-10-CM | POA: Diagnosis not present

## 2024-01-24 NOTE — Telephone Encounter (Signed)
 Please add both surgical orders.  First surgery scheduled for 10/23

## 2024-01-25 ENCOUNTER — Other Ambulatory Visit: Payer: Self-pay | Admitting: Urology

## 2024-01-25 ENCOUNTER — Telehealth: Payer: Self-pay

## 2024-01-25 DIAGNOSIS — K5904 Chronic idiopathic constipation: Secondary | ICD-10-CM | POA: Diagnosis not present

## 2024-01-25 DIAGNOSIS — R350 Frequency of micturition: Secondary | ICD-10-CM

## 2024-01-25 DIAGNOSIS — R634 Abnormal weight loss: Secondary | ICD-10-CM | POA: Diagnosis not present

## 2024-01-25 DIAGNOSIS — R140 Abdominal distension (gaseous): Secondary | ICD-10-CM | POA: Diagnosis not present

## 2024-01-25 NOTE — Telephone Encounter (Signed)
ABN on file

## 2024-01-27 ENCOUNTER — Telehealth: Payer: Self-pay

## 2024-01-27 NOTE — Telephone Encounter (Signed)
     Primary Cardiologist: Redell Shallow, MD  Chart reviewed as part of pre-operative protocol coverage. Given past medical history and time since last visit, based on ACC/AHA guidelines, KANISHK STROEBEL would be at acceptable risk for the planned procedure without further cardiovascular testing.   Regarding ASA therapy, we recommend continuation of ASA throughout the perioperative period. However, if the surgeon feels that cessation of ASA is required in the perioperative period, it may be stopped 5-7 days prior to surgery with a plan to resume it as soon as felt to be feasible from a surgical standpoint in the post-operative period.    I will route this recommendation to the requesting party via Epic fax function and remove from pre-op pool.  Please call with questions.  Josefa HERO. Gawain Crombie NP-C     01/27/2024, 3:35 PM [facility] [address] [place], [place] [address] Office [phone]

## 2024-01-27 NOTE — Telephone Encounter (Signed)
   Pre-operative Risk Assessment    Patient Name: Christopher Conner  DOB: Jun 17, 1950 MRN: [id number]   Date of last office visit: 11/30/23 Christopher SHALLOW, MD Date of next office visit: NONE   Request for Surgical Clearance    Procedure:  EGD  Date of Surgery:  Clearance 01/31/24                                Surgeon:  DR KRISTIE Surgeon's Group or Practice Name:  [facility] [place] Phone number:  [phone] Fax number:  [phone]   Type of Clearance Requested:   - Medical  - Pharmacy:  Hold Aspirin      Type of Anesthesia:  PROPOFOL    Additional requests/questions:    SignedLucie DELENA Ku   01/27/2024, 2:23 PM

## 2024-01-31 DIAGNOSIS — K2100 Gastro-esophageal reflux disease with esophagitis, without bleeding: Secondary | ICD-10-CM | POA: Diagnosis not present

## 2024-01-31 DIAGNOSIS — K2960 Other gastritis without bleeding: Secondary | ICD-10-CM | POA: Diagnosis not present

## 2024-01-31 DIAGNOSIS — R634 Abnormal weight loss: Secondary | ICD-10-CM | POA: Diagnosis not present

## 2024-01-31 DIAGNOSIS — K2940 Chronic atrophic gastritis without bleeding: Secondary | ICD-10-CM | POA: Diagnosis not present

## 2024-01-31 DIAGNOSIS — K259 Gastric ulcer, unspecified as acute or chronic, without hemorrhage or perforation: Secondary | ICD-10-CM | POA: Diagnosis not present

## 2024-01-31 DIAGNOSIS — K2289 Other specified disease of esophagus: Secondary | ICD-10-CM | POA: Diagnosis not present

## 2024-02-02 ENCOUNTER — Encounter: Payer: Self-pay | Admitting: Urology

## 2024-02-02 ENCOUNTER — Ambulatory Visit (INDEPENDENT_AMBULATORY_CARE_PROVIDER_SITE_OTHER): Admitting: Urology

## 2024-02-02 VITALS — BP 146/71 | HR 59

## 2024-02-02 DIAGNOSIS — N400 Enlarged prostate without lower urinary tract symptoms: Secondary | ICD-10-CM

## 2024-02-02 DIAGNOSIS — R3915 Urgency of urination: Secondary | ICD-10-CM | POA: Diagnosis not present

## 2024-02-02 DIAGNOSIS — R350 Frequency of micturition: Secondary | ICD-10-CM | POA: Diagnosis not present

## 2024-02-02 NOTE — Progress Notes (Signed)
 02/02/2024 9:54 AM   Christopher Conner Dec 23, 1950 [id number]  Referring provider: Oris Camie BRAVO, NP [address] [place],  [place] [address]  Urinary frequency   HPI: [name] is a 73yo here for followup for urinary frequency. He failed multiple anticholinergics, beta3s, PTNS, and intravesical botox . He continues to have urinary frequency and nocturia. Nocturia 3-4x. He continues to have bothersome urinary urgency.  He has issues with constipation also.   PMH: Past Medical History:  Diagnosis Date   Anemia 07/15/2023   Arthritis    Bilateral foot-drop    after back surgery   BPH (benign prostatic hyperplasia)    Cataract    Chronic lymphocytic leukemia (HCC) early 2018   no current treatment   CLL (chronic lymphocytic leukemia) (HCC)    DM type 1 (diabetes mellitus, type 1) (HCC)    type 1   Goals of care, counseling/discussion 07/18/2023   High cholesterol    Hypertension    Murmur, cardiac 07/15/2023   Neuromuscular disorder (HCC)    diabetic neuropathy   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   Scattered rhonchi of right lung 11/22/2022    Surgical History: Past Surgical History:  Procedure Laterality Date   APPENDECTOMY  early 20's   BACK SURGERY     BRAIN SURGERY  10/30/04   brain shunt, Codman Hakim   CATARACT EXTRACTION Bilateral    left eye prostheisi shell   CYST REMOVAL NECK  yrs ago   CYSTOSCOPY [name] 04/22/2023   Procedure: CYSTOSCOPY;  Surgeon: Sherrilee Belvie CROME, MD;  Location: [facility];  Service: Urology;  Laterality: [name];   CYSTOSCOPY WITH INJECTION [name] 12/17/2022   Procedure: CYSTOSCOPY WITH INJECTION-Botox ;  Surgeon: Sherrilee Belvie CROME, MD;  Location: [facility];  Service: Urology;  Laterality: [name];   CYSTOSCOPY WITH INSERTION OF UROLIFT  april/march 2020   done in office   CYSTOSCOPY WITH INSERTION OF UROLIFT [name] 10/12/2019   Procedure: CYSTOSCOPY WITH INSERTION OF UROLIFT;  Surgeon: Sherrilee Belvie CROME, MD;   Location: [facility];  Service: Urology;  Laterality: [name];   EYE SURGERY     LEFT HEART CATH AND CORONARY ANGIOGRAPHY [name] 10/05/2023   Procedure: LEFT HEART CATH AND CORONARY ANGIOGRAPHY;  Surgeon: Swaziland, Peter M, MD;  Location: [facility] INVASIVE CV LAB;  Service: Cardiovascular;  Laterality: [name];   LUMBAR FUSION  1998 and 2000   l5 to s1 1998, then l4 and l5 in 2000   SPINE SURGERY     H. Elsner, L4-5 & L5-S1   TRANSURETHRAL RESECTION OF PROSTATE [name] 04/22/2023   Procedure: TRANSURETHRAL RESECTION OF THE PROSTATE (TURP);  Surgeon: Sherrilee Belvie CROME, MD;  Location: [facility];  Service: Urology;  Laterality: [name];   VENTRICULOPERITONEAL SHUNT  2006    Home Medications:  Allergies as of 02/02/2024       Reactions   Lisinopril  Cough   Latex Itching, Rash        Medication List        Accurate as of February 02, 2024  9:54 AM. If you have any questions, ask your nurse or doctor.          ACETYL L-CARNITINE PO Take 500 mg by mouth in the morning.   acidophilus Caps capsule Take 1 capsule by mouth every evening.   amLODipine  10 MG tablet Commonly known as: NORVASC  Take 1 tablet (10 mg total) by mouth in the morning.   ammonium lactate  12 % lotion Commonly known as:  Amlactin Daily Apply 1 Application topically as needed.   aspirin  EC 81 MG tablet Take 1 tablet (81 mg total) by mouth daily. Swallow whole.   atorvastatin  80 MG tablet Commonly known as: LIPITOR  Take 1 tablet (80 mg total) by mouth every evening.   B-100 PO Take 1 tablet by mouth in the morning.   carvedilol  3.125 MG tablet Commonly known as: COREG  Take 1 tablet (3.125 mg total) by mouth 2 (two) times daily with a meal.   CoQ-10 100 MG Caps Take 100 mg by mouth daily.   fesoterodine  8 MG Tb24 tablet Commonly known as: TOVIAZ  Take 1 tablet (8 mg total) by mouth daily.   folic acid 1 MG tablet Commonly known as: FOLVITE Take 1 mg by mouth daily.   hydrOXYzine  10 MG tablet Commonly  known as: ATARAX  Take 1 tablet (10 mg total) by mouth at bedtime.   methotrexate 2.5 MG tablet Commonly known as: RHEUMATREX Take 20 mg by mouth every Thursday. In the morning.   multivitamin with minerals Tabs tablet Take 1 tablet by mouth every evening.   nitroGLYCERIN  0.4 MG SL tablet Commonly known as: NITROSTAT  Place 1 tablet (0.4 mg total) under the tongue every 5 (five) minutes as needed for chest pain.   NovoLOG  100 UNIT/ML injection Generic drug: insulin  aspart INJECT UP TO 100 UNITS DAILY MAXIMUM DOSE WITH INSULIN  PUMP DAILY   OMEGA 3 PO Take 1 capsule by mouth daily.   REFRESH OP Place 1-2 drops into the right eye every 2 (two) hours as needed (dryness).   telmisartan  40 MG tablet Commonly known as: MICARDIS  Take 1 tablet (40 mg total) by mouth in the morning.   Vitamin D 50 MCG (2000 UT) Caps Take 2,000 Units by mouth in the morning.        Allergies:  Allergies  Allergen Reactions   Lisinopril  Cough   Latex Itching and Rash    Family History: Family History  Problem Relation Age of Onset   Hypertension Mother    Hyperlipidemia Mother    Diabetes Mother    Diabetes Father     Social History:  reports that he has never smoked. He has never used smokeless tobacco. He reports that he does not drink alcohol and does not use drugs.  ROS: All other review of systems were reviewed and are negative except what is noted above in HPI  Physical Exam: BP (!) 146/71   Pulse (!) 59   Constitutional:  Alert and oriented, No acute distress. HEENT: [place] AT, moist mucus membranes.  Trachea midline, no masses. Cardiovascular: No clubbing, cyanosis, or edema. Respiratory: Normal respiratory effort, no increased work of breathing. GI: Abdomen is soft, nontender, nondistended, no abdominal masses GU: No CVA tenderness.  Lymph: No cervical or inguinal lymphadenopathy. Skin: No rashes, bruises or suspicious lesions. Neurologic: Grossly intact, no focal deficits,  moving all 4 extremities. Psychiatric: Normal mood and affect.  Laboratory Data: Lab Results  Component Value Date   WBC 14.3 (H) 09/29/2023   HGB 13.7 09/29/2023   HCT 40.4 09/29/2023   MCV 102 (H) 09/29/2023   PLT 284 09/29/2023    Lab Results  Component Value Date   CREATININE 0.71 (L) 09/07/2023    No results found for: PSA  No results found for: TESTOSTERONE  Lab Results  Component Value Date   HGBA1C 6.0 (H) 12/11/2022    Urinalysis    Component Value Date/Time   COLORURINE YELLOW 08/16/2015 0244   APPEARANCEUR Clear 12/13/2023  1400   LABSPEC 1.029 08/16/2015 0244   PHURINE 5.5 08/16/2015 0244   GLUCOSEU Negative 12/13/2023 1400   HGBUR NEGATIVE 08/16/2015 0244   BILIRUBINUR Negative 12/13/2023 1400   KETONESUR >80 (A) 08/16/2015 0244   PROTEINUR Negative 12/13/2023 1400   PROTEINUR NEGATIVE 08/16/2015 0244   UROBILINOGEN 0.2 04/09/2014 1947   NITRITE Negative 12/13/2023 1400   NITRITE NEGATIVE 08/16/2015 0244   LEUKOCYTESUR Negative 12/13/2023 1400    Lab Results  Component Value Date   LABMICR Comment 12/13/2023   WBCUA >30 (A) 06/02/2023   LABEPIT 0-10 06/02/2023   BACTERIA Few (A) 06/02/2023    Pertinent Imaging:  Results for orders placed during the hospital encounter of 05/23/10  DG Abd 1 View  Narrative *RADIOLOGY REPORT*  Clinical Data: Weakness with nausea and vomiting.  ABDOMEN - 1 VIEW  Comparison: 02/10/2010.  Findings: Ventriculoperitoneal shunt tubing extends into the right mid abdomen.  There is no shunt discontinuity.  Moderate stool is noted throughout the colon.  The bowel gas pattern is nonobstructive.  There are postsurgical changes of the lower lumbar spine status post fusion.  IMPRESSION:  1.  Moderate stool throughout the colon suggesting constipation. 2.  Intact ventricular peritoneal shunt.  Original Report Authenticated By: ELSIE WENDI PERONE, M.D.  No results found for this or any previous  visit.  No results found for this or any previous visit.  No results found for this or any previous visit.  No results found for this or any previous visit.  No results found for this or any previous visit.  No results found for this or any previous visit.  No results found for this or any previous visit.   Assessment & Plan:    1. Urinary frequency and urgency -The risks/benefits/alternatives to sacral neuromodulation was explained to the patient and he understands and wishes to proceed with surgery.   No follow-ups on file.  [name], MD  [facility] [place]

## 2024-02-02 NOTE — H&P (View-Only) (Signed)
 02/02/2024 9:54 AM   Christopher Conner Dec 23, 1950 [id number]  Referring provider: Oris Camie BRAVO, NP [address] [place],  [place] [address]  Urinary frequency   HPI: [name] is a 73yo here for followup for urinary frequency. He failed multiple anticholinergics, beta3s, PTNS, and intravesical botox . He continues to have urinary frequency and nocturia. Nocturia 3-4x. He continues to have bothersome urinary urgency.  He has issues with constipation also.   PMH: Past Medical History:  Diagnosis Date   Anemia 07/15/2023   Arthritis    Bilateral foot-drop    after back surgery   BPH (benign prostatic hyperplasia)    Cataract    Chronic lymphocytic leukemia (HCC) early 2018   no current treatment   CLL (chronic lymphocytic leukemia) (HCC)    DM type 1 (diabetes mellitus, type 1) (HCC)    type 1   Goals of care, counseling/discussion 07/18/2023   High cholesterol    Hypertension    Murmur, cardiac 07/15/2023   Neuromuscular disorder (HCC)    diabetic neuropathy   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   Scattered rhonchi of right lung 11/22/2022    Surgical History: Past Surgical History:  Procedure Laterality Date   APPENDECTOMY  early 20's   BACK SURGERY     BRAIN SURGERY  10/30/04   brain shunt, [facility]   CATARACT EXTRACTION Bilateral    left eye prostheisi shell   CYST REMOVAL NECK  yrs ago   CYSTOSCOPY [name] 04/22/2023   Procedure: CYSTOSCOPY;  Surgeon: Sherrilee Belvie CROME, MD;  Location: [facility];  Service: Urology;  Laterality: [name];   CYSTOSCOPY WITH INJECTION [name] 12/17/2022   Procedure: CYSTOSCOPY WITH INJECTION-Botox ;  Surgeon: Sherrilee Belvie CROME, MD;  Location: [facility];  Service: Urology;  Laterality: [name];   CYSTOSCOPY WITH INSERTION OF UROLIFT  april/march 2020   done in office   CYSTOSCOPY WITH INSERTION OF UROLIFT [name] 10/12/2019   Procedure: CYSTOSCOPY WITH INSERTION OF UROLIFT;  Surgeon: Sherrilee Belvie CROME, MD;   Location: [facility];  Service: Urology;  Laterality: [name];   EYE SURGERY     LEFT HEART CATH AND CORONARY ANGIOGRAPHY [name] 10/05/2023   Procedure: LEFT HEART CATH AND CORONARY ANGIOGRAPHY;  Surgeon: [place], Peter M, MD;  Location: [facility] INVASIVE CV [facility];  Service: Cardiovascular;  Laterality: [name];   LUMBAR FUSION  1998 and 2000   l5 to s1 1998, then l4 and l5 in 2000   SPINE SURGERY     H. Elsner, L4-5 & L5-S1   TRANSURETHRAL RESECTION OF PROSTATE [name] 04/22/2023   Procedure: TRANSURETHRAL RESECTION OF THE PROSTATE (TURP);  Surgeon: Sherrilee Belvie CROME, MD;  Location: [facility];  Service: Urology;  Laterality: [name];   VENTRICULOPERITONEAL SHUNT  2006    Home Medications:  Allergies as of 02/02/2024       Reactions   Lisinopril  Cough   Latex Itching, Rash        Medication List        Accurate as of February 02, 2024  9:54 AM. If you have any questions, ask your nurse or doctor.          ACETYL L-CARNITINE PO Take 500 mg by mouth in the morning.   acidophilus Caps capsule Take 1 capsule by mouth every evening.   amLODipine  10 MG tablet Commonly known as: NORVASC  Take 1 tablet (10 mg total) by mouth in the morning.   ammonium lactate  12 % lotion Commonly known as:  Amlactin Daily Apply 1 Application topically as needed.   aspirin  EC 81 MG tablet Take 1 tablet (81 mg total) by mouth daily. Swallow whole.   atorvastatin  80 MG tablet Commonly known as: LIPITOR  Take 1 tablet (80 mg total) by mouth every evening.   B-100 PO Take 1 tablet by mouth in the morning.   carvedilol  3.125 MG tablet Commonly known as: COREG  Take 1 tablet (3.125 mg total) by mouth 2 (two) times daily with a meal.   CoQ-10 100 MG Caps Take 100 mg by mouth daily.   fesoterodine  8 MG Tb24 tablet Commonly known as: TOVIAZ  Take 1 tablet (8 mg total) by mouth daily.   folic acid 1 MG tablet Commonly known as: FOLVITE Take 1 mg by mouth daily.   hydrOXYzine  10 MG tablet Commonly  known as: ATARAX  Take 1 tablet (10 mg total) by mouth at bedtime.   methotrexate 2.5 MG tablet Commonly known as: RHEUMATREX Take 20 mg by mouth every Thursday. In the morning.   multivitamin with minerals Tabs tablet Take 1 tablet by mouth every evening.   nitroGLYCERIN  0.4 MG SL tablet Commonly known as: NITROSTAT  Place 1 tablet (0.4 mg total) under the tongue every 5 (five) minutes as needed for chest pain.   NovoLOG  100 UNIT/ML injection Generic drug: insulin  aspart INJECT UP TO 100 UNITS DAILY MAXIMUM DOSE WITH INSULIN  PUMP DAILY   OMEGA 3 PO Take 1 capsule by mouth daily.   REFRESH OP Place 1-2 drops into the right eye every 2 (two) hours as needed (dryness).   telmisartan  40 MG tablet Commonly known as: MICARDIS  Take 1 tablet (40 mg total) by mouth in the morning.   Vitamin D 50 MCG (2000 UT) Caps Take 2,000 Units by mouth in the morning.        Allergies:  Allergies  Allergen Reactions   Lisinopril  Cough   Latex Itching and Rash    Family History: Family History  Problem Relation Age of Onset   Hypertension Mother    Hyperlipidemia Mother    Diabetes Mother    Diabetes Father     Social History:  reports that he has never smoked. He has never used smokeless tobacco. He reports that he does not drink alcohol and does not use drugs.  ROS: All other review of systems were reviewed and are negative except what is noted above in HPI  Physical Exam: BP (!) 146/71   Pulse (!) 59   Constitutional:  Alert and oriented, No acute distress. HEENT: [place] AT, moist mucus membranes.  Trachea midline, no masses. Cardiovascular: No clubbing, cyanosis, or edema. Respiratory: Normal respiratory effort, no increased work of breathing. GI: Abdomen is soft, nontender, nondistended, no abdominal masses GU: No CVA tenderness.  Lymph: No cervical or inguinal lymphadenopathy. Skin: No rashes, bruises or suspicious lesions. Neurologic: Grossly intact, no focal deficits,  moving all 4 extremities. Psychiatric: Normal mood and affect.  Laboratory Data: Lab Results  Component Value Date   WBC 14.3 (H) 09/29/2023   HGB 13.7 09/29/2023   HCT 40.4 09/29/2023   MCV 102 (H) 09/29/2023   PLT 284 09/29/2023    Lab Results  Component Value Date   CREATININE 0.71 (L) 09/07/2023    No results found for: PSA  No results found for: TESTOSTERONE  Lab Results  Component Value Date   HGBA1C 6.0 (H) 12/11/2022    Urinalysis    Component Value Date/Time   COLORURINE YELLOW 08/16/2015 0244   APPEARANCEUR Clear 12/13/2023  1400   LABSPEC 1.029 08/16/2015 0244   PHURINE 5.5 08/16/2015 0244   GLUCOSEU Negative 12/13/2023 1400   HGBUR NEGATIVE 08/16/2015 0244   BILIRUBINUR Negative 12/13/2023 1400   KETONESUR >80 (A) 08/16/2015 0244   PROTEINUR Negative 12/13/2023 1400   PROTEINUR NEGATIVE 08/16/2015 0244   UROBILINOGEN 0.2 04/09/2014 1947   NITRITE Negative 12/13/2023 1400   NITRITE NEGATIVE 08/16/2015 0244   LEUKOCYTESUR Negative 12/13/2023 1400    Lab Results  Component Value Date   LABMICR Comment 12/13/2023   WBCUA >30 (A) 06/02/2023   LABEPIT 0-10 06/02/2023   BACTERIA Few (A) 06/02/2023    Pertinent Imaging:  Results for orders placed during the hospital encounter of 05/23/10  DG Abd 1 View  Narrative *RADIOLOGY REPORT*  Clinical Data: Weakness with nausea and vomiting.  ABDOMEN - 1 VIEW  Comparison: 02/10/2010.  Findings: Ventriculoperitoneal shunt tubing extends into the right mid abdomen.  There is no shunt discontinuity.  Moderate stool is noted throughout the colon.  The bowel gas pattern is nonobstructive.  There are postsurgical changes of the lower lumbar spine status post fusion.  IMPRESSION:  1.  Moderate stool throughout the colon suggesting constipation. 2.  Intact ventricular peritoneal shunt.  Original Report Authenticated By: ELSIE WENDI PERONE, M.D.  No results found for this or any previous  visit.  No results found for this or any previous visit.  No results found for this or any previous visit.  No results found for this or any previous visit.  No results found for this or any previous visit.  No results found for this or any previous visit.  No results found for this or any previous visit.   Assessment & Plan:    1. Urinary frequency and urgency -The risks/benefits/alternatives to sacral neuromodulation was explained to the patient and he understands and wishes to proceed with surgery.   No follow-ups on file.  [name], MD  [facility] [place]

## 2024-02-02 NOTE — Patient Instructions (Signed)
 Nerve Test Before Sacral Nerve Stimulator Surgery: What to Expect  Before having a sacral nerve stimulator placed, you'll have a trial test known as a nerve test. This test helps find out if sacral nerve stimulation (SNS) will help your problem. For the test, a temporary wire, called a lead, will be placed in your body. Electric pulses will be sent from a temporary nerve stimulator device that's outside your body. The pulses will move through the lead to an area close to a sacral nerve. The test will take 1-2 weeks to see if this treatment works. You'll keep a journal of your bladder and bowel symptoms before and after the test. If your symptoms improve during the test, you may have surgery to place the permanent sacral nerve stimulator in your body. Tell a health care provider about: Any allergies you have. All medicines you take. These include vitamins, herbs, eye drops, and creams. Any problems you or family members have had with anesthesia. Any bleeding problems you have. Any surgeries you've had. Any medical problems you have. Whether you're pregnant or may be pregnant. Any trouble you have lying on your belly. What are the risks? Your health care provider will talk with you about risks. These may include: Movement of the lead away from where it was placed. Infection. Bleeding. Damage to nearby structures like nerves near the spine. Pain. Allergic reactions to medicines. What happens before? Medicines Ask about changing or stopping: Any medicines you take. Any vitamins, herbs, or supplements you take. Do not take aspirin or ibuprofen unless you're told to. General instructions Eat and drink only as you've been told. For your safety, you may: Need to wash your skin with a soap that kills germs. Get antibiotics. Have your procedure site marked. Have hair removed at the procedure site. Plan to have a responsible adult drive you home from the hospital or clinic. You may not be  allowed to drive. What happens during the procedure to place the leads? You'll be asked to lie on your belly or side. You'll be given anesthesia to numb the area and keep you from feeling pain. A long needle will be put into your lower back and guided to the place where the sacral nerves leave the spine. X-rays will be used to guide the needle to the right spot. The position of the needle will be tested. If the needle is in the right spot, your toes or feet may move. You may feel a tingling in your legs. Or you may get a pulsing and tapping feeling in the genital and rectal areas. The lead will be put through the needle and into your body. The lead will be anchored in place close to your sacral nerves. The end of the lead that's outside your body will be attached to the nerve stimulator device. A bandage will be placed to cover the lead. Your provider will program the rate at which the nerve stimulator will send the electric pulses. These steps may vary. Ask what you can expect. What happens after? You'll be watched closely until you leave. This includes checking your pain level, blood pressure, heart rate, and breathing rate. You'll be taught how to use the nerve stimulator device for the nerve test. This information is not intended to replace advice given to you by your health care provider. Make sure you discuss any questions you have with your health care provider. Document Revised: 12/16/2022 Document Reviewed: 12/16/2022 Elsevier Patient Education  2024 [employer].

## 2024-02-04 NOTE — Patient Instructions (Signed)
 Your procedure is scheduled on: 02/10/2024  Report to [facility] Main Entrance at    10:00 AM.  Call this number if you have problems the morning of surgery: [phone]   Remember:   Do not Eat after midnight, You may have CLEAR liquids until 8 am, water , tea, soft drink, and/or black coffee, NO CREAM OR MILK  Set Insulin  pump at basal rate morning of procedure   Bring extra pump supplies        No Smoking the morning of surgery  :  Take these medicines the morning of surgery with A SIP OF WATER : Amlodipine  and Carvedilol    Do not wear jewelry, make-up or nail polish.  Do not wear lotions, powders, or perfumes. You may wear deodorant.  Do not shave 48 hours prior to surgery. Men may shave face and neck.  Do not bring valuables to the hospital.  Contacts, dentures or bridgework may not be worn into surgery.  Leave suitcase in the car. After surgery it may be brought to your room.  For patients admitted to the hospital, checkout time is 11:00 AM the day of discharge.   Patients discharged the day of surgery will not be allowed to drive home.    Special Instructions: Shower using CHG night before surgery and shower the day of surgery use CHG.  Use special wash - you have one bottle of CHG for all showers.  You should use approximately 1/2 of the bottle for each shower. How to Use Chlorhexidine  at Home in the Shower Chlorhexidine  gluconate (CHG) is a germ-killing (antiseptic) wash that's used to clean the skin. It can get rid of the germs that normally live on the skin and can keep them away for about 24 hours. If you're having surgery, you may be told to shower with CHG at home the night before surgery. This can help lower your risk for infection. To use CHG wash in the shower, follow the steps below. Supplies needed: CHG body wash. Clean washcloth. Clean towel. How to use CHG in the shower Follow these steps unless you're told to use CHG in a different way: Start the  shower. Use your normal soap and shampoo to wash your face and hair. Turn off the shower or move out of the shower stream. Pour CHG onto a clean washcloth. Do not use any type of brush or rough sponge. Start at your neck, washing your body down to your toes. Make sure you: Wash the part of your body where the surgery will be done for at least 1 minute. Do not scrub. Do not use CHG on your head or face unless your health care provider tells you to. If it gets into your ears or eyes, rinse them well with water . Do not wash your genitals with CHG. Wash your back and under your arms. Make sure to wash skin folds. Let the CHG sit on your skin for 1-2 minutes or as long as told. Rinse your entire body in the shower, including all body creases and folds. Turn off the shower. Dry off with a clean towel. Do not put anything on your skin afterward, such as powder, lotion, or perfume. Put on clean clothes or pajamas. If it's the night before surgery, sleep in clean sheets. General tips Use CHG only as told, and follow the instructions on the label. Use the full amount of CHG as told. This is often one bottle. Do not smoke and stay away from flames after using CHG.  Your skin may feel sticky after using CHG. This is normal. The sticky feeling will go away as the CHG dries. Do not use CHG: If you have a chlorhexidine  allergy or have reacted to chlorhexidine  in the past. On open wounds or areas of skin that have broken skin, cuts, or scrapes. On babies younger than 69 months of age. Contact a health care provider if: You have questions about using CHG. Your skin gets irritated or itchy. You have a rash after using CHG. You swallow any CHG. Call your local poison control center [phone] in the [place] Your eyes itch badly, or they become very red or swollen. Your hearing changes. You have trouble seeing. If you can't reach your provider, go to an urgent care or emergency room. Do not drive  yourself. Get help right away if: You have swelling or tingling in your mouth or throat. You make high-pitched whistling sounds when you breathe, most often when you breathe out (wheeze). You have trouble breathing. These symptoms may be an emergency. Call 911 right away. Do not wait to see if the symptoms will go away. Do not drive yourself to the hospital. This information is not intended to replace advice given to you by your health care provider. Make sure you discuss any questions you have with your health care provider. Document Revised: 10/20/2022 Document Reviewed: 10/16/2021 Elsevier Patient Education  2024 [employer] Surgery to Place a Sacral Nerve Stimulator: What to Know After After surgery to place a sacral nerve stimulator, it's common to have soreness or mild pain at the site where the cut was made during surgery. You may also have a pulsing or tapping feeling in the genital and rectal areas. This shouldn't be painful. Follow these instructions at home: Medicines Take your medicines only as told. If you were given antibiotics, take them as told. Do not stop taking them even if you start to feel better. Ask your health care provider if it's safe to drive or use machines while taking your medicine. Caring for your cuts from surgery  Take care of your cut areas as told. Make sure you: Wash your hands with soap and water  for at least 20 seconds before and after you change your bandage. If you can't use soap and water , use hand sanitizer. Change your bandage. Leave stitches or skin glue alone. Leave tape strips alone unless you're told to take them off. You may trim the edges of the tape strips if they curl up. Check the cut areas every day for signs of infection. Check for: Redness, swelling, or pain. Fluid or blood. Warmth. Pus or a bad smell. Activity Avoid exercise and activities that take a lot of effort. Also avoid doing things that involve sudden twisting,  stretching, or bouncing. Ask if it's OK for you to lift. Ask what things are safe for you to do at home. Ask when you can go back to work or school. Bathing Do not take baths, swim, or use a hot tub until you're told it's OK. Ask if you can shower. Keep the bandage dry until your provider says it can be removed. Using the stimulator You'll be given a handheld remote-control device. This remote will allow you to turn your sacral nerve stimulator on and off. Follow instructions from your provider about how to use this remote. Keep your handheld remote with you at all times. Managing pain, stiffness, and swelling Use ice or an ice pack as told. Place a towel between your skin  and the ice. Leave the ice on for 20 minutes, 2-3 times a day. If your skin turns red, take off the ice right away to prevent skin damage. The risk of damage is higher if you can't feel pain, heat, or cold. General instructions  If you were given a sedative, do not drive or use machines until you're told it's safe. A sedative can make you sleepy. Tell all your providers and any health care team members about your device. Remind them before having any procedures or tests, including: MRI or ultrasound. Lithotripsy. Radiation therapy. Do not have a treatment called diathermy near your surgery site. This is a treatment that provides heat to your deep tissues. Tell your dentists about your device. Wear a medical alert bracelet that shows you have this device. Carry a device ID card at all times. Show your device ID card to bypass security gates. Keep all follow-up visits. Your provider may need to adjust the stimulator over several visits until it works well for you. Your provider may give you more instructions. Make sure you know what you can and can't do. Contact a health care provider if: Your device stops working. The device isn't helping your symptoms. You have pain at the site of the implant. You have a fever or  chills. You have any signs of infection. This information is not intended to replace advice given to you by your health care provider. Make sure you discuss any questions you have with your health care provider. Document Revised: 12/16/2022 Document Reviewed: 12/16/2022 Elsevier Patient Education  2024 Else[employer] Inc. General Anesthesia, Adult, Care After The following information offers guidance on how to care for yourself after your procedure. Your health care provider may also give you more specific instructions. If you have problems or questions, contact your health care provider. What can I expect after the procedure? After the procedure, it is common for people to: Have pain or discomfort at the IV site. Have nausea or vomiting. Have a sore throat or hoarseness. Have trouble concentrating. Feel cold or chills. Feel weak, sleepy, or tired (fatigue). Have soreness and body aches. These can affect parts of the body that were not involved in surgery. Follow these instructions at home: For the time period you were told by your health care provider:  Rest. Do not participate in activities where you could fall or become injured. Do not drive or use machinery. Do not drink alcohol. Do not take sleeping pills or medicines that cause drowsiness. Do not make important decisions or sign legal documents. Do not take care of children on your own. General instructions Drink enough fluid to keep your urine pale yellow. If you have sleep apnea, surgery and certain medicines can increase your risk for breathing problems. Follow instructions from your health care provider about wearing your sleep device: Anytime you are sleeping, including during daytime naps. While taking prescription pain medicines, sleeping medicines, or medicines that make you drowsy. Return to your normal activities as told by your health care provider. Ask your health care provider what activities are safe for you. Take  over-the-counter and prescription medicines only as told by your health care provider. Do not use any products that contain nicotine or tobacco. These products include cigarettes, chewing tobacco, and vaping devices, such as e-cigarettes. These can delay incision healing after surgery. If you need help quitting, ask your health care provider. Contact a health care provider if: You have nausea or vomiting that does not get better with medicine.  You vomit every time you eat or drink. You have pain that does not get better with medicine. You cannot urinate or have bloody urine. You develop a skin rash. You have a fever. Get help right away if: You have trouble breathing. You have chest pain. You vomit blood. These symptoms may be an emergency. Get help right away. Call 911. Do not wait to see if the symptoms will go away. Do not drive yourself to the hospital. Summary After the procedure, it is common to have a sore throat, hoarseness, nausea, vomiting, or to feel weak, sleepy, or fatigue. For the time period you were told by your health care provider, do not drive or use machinery. Get help right away if you have difficulty breathing, have chest pain, or vomit blood. These symptoms may be an emergency. This information is not intended to replace advice given to you by your health care provider. Make sure you discuss any questions you have with your health care provider. Document Revised: 07/04/2021 Document Reviewed: 07/04/2021 Elsevier Patient Education  2024 [employer].

## 2024-02-07 ENCOUNTER — Encounter (HOSPITAL_COMMUNITY): Payer: Self-pay

## 2024-02-07 ENCOUNTER — Encounter (HOSPITAL_COMMUNITY)
Admission: RE | Admit: 2024-02-07 | Discharge: 2024-02-07 | Disposition: A | Discharge status: Home / Self Care | Source: Ambulatory Visit | Attending: Urology | Admitting: Urology

## 2024-02-07 VITALS — BP 146/71 | HR 59 | Resp 18 | Ht 69.0 in | Wt 173.9 lb

## 2024-02-07 DIAGNOSIS — E109 Type 1 diabetes mellitus without complications: Secondary | ICD-10-CM | POA: Insufficient documentation

## 2024-02-07 DIAGNOSIS — C9110 Chronic lymphocytic leukemia of B-cell type not having achieved remission: Secondary | ICD-10-CM | POA: Diagnosis not present

## 2024-02-07 DIAGNOSIS — D649 Anemia, unspecified: Secondary | ICD-10-CM | POA: Insufficient documentation

## 2024-02-07 DIAGNOSIS — Z01812 Encounter for preprocedural laboratory examination: Secondary | ICD-10-CM | POA: Insufficient documentation

## 2024-02-07 DIAGNOSIS — Z01818 Encounter for other preprocedural examination: Secondary | ICD-10-CM | POA: Diagnosis present

## 2024-02-07 LAB — CBC WITH DIFFERENTIAL/PLATELET
Abs Immature Granulocytes: 0.03 K/uL (ref 0.00–0.07)
Basophils Absolute: 0.1 K/uL (ref 0.0–0.1)
Basophils Relative: 0 %
Eosinophils Absolute: 0.2 K/uL (ref 0.0–0.5)
Eosinophils Relative: 1 %
HCT: 37.5 % — ABNORMAL LOW (ref 39.0–52.0)
Hemoglobin: 12.8 g/dL — ABNORMAL LOW (ref 13.0–17.0)
Immature Granulocytes: 0 %
Lymphocytes Relative: 73 %
Lymphs Abs: 12.3 K/uL — ABNORMAL HIGH (ref 0.7–4.0)
MCH: 34 pg (ref 26.0–34.0)
MCHC: 34.1 g/dL (ref 30.0–36.0)
MCV: 99.7 fL (ref 80.0–100.0)
Monocytes Absolute: 0.5 K/uL (ref 0.1–1.0)
Monocytes Relative: 3 %
Neutro Abs: 3.9 K/uL (ref 1.7–7.7)
Neutrophils Relative %: 23 %
Platelets: 280 K/uL (ref 150–400)
RBC: 3.76 MIL/uL — ABNORMAL LOW (ref 4.22–5.81)
RDW: 12.4 % (ref 11.5–15.5)
Smear Review: NORMAL
WBC: 17 K/uL — ABNORMAL HIGH (ref 4.0–10.5)
nRBC: 0 % (ref 0.0–0.2)

## 2024-02-07 LAB — BASIC METABOLIC PANEL WITH GFR
Anion gap: 8 (ref 5–15)
BUN: 17 mg/dL (ref 8–23)
CO2: 26 mmol/L (ref 22–32)
Calcium: 8.7 mg/dL — ABNORMAL LOW (ref 8.9–10.3)
Chloride: 94 mmol/L — ABNORMAL LOW (ref 98–111)
Creatinine, Ser: 0.66 mg/dL (ref 0.61–1.24)
GFR, Estimated: >60 mL/min (ref >60)
Glucose, Bld: 147 mg/dL — ABNORMAL HIGH (ref 70–99)
Potassium: 4.9 mmol/L (ref 3.5–5.1)
Sodium: 127 mmol/L — ABNORMAL LOW (ref 135–145)

## 2024-02-07 LAB — HEMOGLOBIN A1C
Hgb A1c MFr Bld: 5.8 % — ABNORMAL HIGH (ref 4.8–5.6)
Mean Plasma Glucose: 119.76 mg/dL

## 2024-02-10 ENCOUNTER — Encounter (HOSPITAL_COMMUNITY)
Admission: RE | Disposition: A | Discharge status: Home / Self Care | Payer: Self-pay | Source: Home / Self Care | Attending: Urology

## 2024-02-10 ENCOUNTER — Other Ambulatory Visit: Payer: Self-pay

## 2024-02-10 ENCOUNTER — Encounter (HOSPITAL_COMMUNITY): Payer: Self-pay | Admitting: Urology

## 2024-02-10 ENCOUNTER — Ambulatory Visit (HOSPITAL_COMMUNITY)
Admission: RE | Admit: 2024-02-10 | Discharge: 2024-02-10 | Disposition: A | Discharge status: Home / Self Care | Attending: Urology | Admitting: Urology

## 2024-02-10 ENCOUNTER — Ambulatory Visit (HOSPITAL_COMMUNITY)

## 2024-02-10 ENCOUNTER — Ambulatory Visit (HOSPITAL_BASED_OUTPATIENT_CLINIC_OR_DEPARTMENT_OTHER): Payer: Self-pay | Admitting: Anesthesiology

## 2024-02-10 ENCOUNTER — Encounter (HOSPITAL_COMMUNITY): Payer: Self-pay | Admitting: Anesthesiology

## 2024-02-10 DIAGNOSIS — Z79899 Other long term (current) drug therapy: Secondary | ICD-10-CM | POA: Diagnosis not present

## 2024-02-10 DIAGNOSIS — I10 Essential (primary) hypertension: Secondary | ICD-10-CM | POA: Insufficient documentation

## 2024-02-10 DIAGNOSIS — N3281 Overactive bladder: Secondary | ICD-10-CM | POA: Diagnosis not present

## 2024-02-10 DIAGNOSIS — R3915 Urgency of urination: Secondary | ICD-10-CM | POA: Insufficient documentation

## 2024-02-10 DIAGNOSIS — D649 Anemia, unspecified: Secondary | ICD-10-CM | POA: Diagnosis not present

## 2024-02-10 DIAGNOSIS — Z982 Presence of cerebrospinal fluid drainage device: Secondary | ICD-10-CM | POA: Insufficient documentation

## 2024-02-10 DIAGNOSIS — C9110 Chronic lymphocytic leukemia of B-cell type not having achieved remission: Secondary | ICD-10-CM | POA: Insufficient documentation

## 2024-02-10 DIAGNOSIS — Z794 Long term (current) use of insulin: Secondary | ICD-10-CM | POA: Insufficient documentation

## 2024-02-10 DIAGNOSIS — E871 Hypo-osmolality and hyponatremia: Secondary | ICD-10-CM

## 2024-02-10 DIAGNOSIS — R351 Nocturia: Secondary | ICD-10-CM | POA: Diagnosis not present

## 2024-02-10 DIAGNOSIS — E119 Type 2 diabetes mellitus without complications: Secondary | ICD-10-CM

## 2024-02-10 DIAGNOSIS — E1040 Type 1 diabetes mellitus with diabetic neuropathy, unspecified: Secondary | ICD-10-CM | POA: Diagnosis not present

## 2024-02-10 DIAGNOSIS — R350 Frequency of micturition: Secondary | ICD-10-CM | POA: Insufficient documentation

## 2024-02-10 LAB — GLUCOSE, CAPILLARY
Glucose-Capillary: 79 mg/dL (ref 70–99)
Glucose-Capillary: 116 mg/dL — ABNORMAL HIGH (ref 70–99)

## 2024-02-10 SURGERY — INSERTION, SACRAL NERVE STIMULATOR, INTERSTIM, STAGE 1
Anesthesia: General

## 2024-02-10 MED ORDER — LACTATED RINGERS IV SOLN: INTRAVENOUS | Status: DC | PRN

## 2024-02-10 MED ORDER — EPHEDRINE SULFATE (PRESSORS) 25 MG/5ML IV SOSY
PREFILLED_SYRINGE | INTRAVENOUS | Status: DC | PRN
  Administered 2024-02-10 (×2): 5 mg via INTRAVENOUS

## 2024-02-10 MED ORDER — FENTANYL CITRATE (PF) 50 MCG/ML IJ SOSY: 25.0000 ug | PREFILLED_SYRINGE | INTRAMUSCULAR | Status: DC | PRN

## 2024-02-10 MED ORDER — FENTANYL CITRATE (PF) 100 MCG/2ML IJ SOLN
INTRAMUSCULAR | Status: DC | PRN
  Administered 2024-02-10 (×2): 50 ug via INTRAVENOUS

## 2024-02-10 MED ORDER — PROPOFOL 500 MG/50ML IV EMUL
INTRAVENOUS | Status: AC
  Filled 2024-02-10: qty 50

## 2024-02-10 MED ORDER — OXYCODONE HCL 5 MG/5ML PO SOLN: 5.0000 mg | Freq: Once | ORAL | Status: DC | PRN

## 2024-02-10 MED ORDER — CEFAZOLIN SODIUM-DEXTROSE 2-4 GM/100ML-% IV SOLN
2.0000 g | INTRAVENOUS | Status: AC
  Administered 2024-02-10: 2 g via INTRAVENOUS
  Filled 2024-02-10: qty 100

## 2024-02-10 MED ORDER — ONDANSETRON HCL 4 MG/2ML IJ SOLN: 4.0000 mg | Freq: Once | INTRAMUSCULAR | Status: DC | PRN

## 2024-02-10 MED ORDER — BUPIVACAINE HCL (PF) 0.25 % IJ SOLN
INTRAMUSCULAR | Status: DC | PRN
  Administered 2024-02-10: 5 mL
  Administered 2024-02-10: 4 mL
  Administered 2024-02-10: 11 mL

## 2024-02-10 MED ORDER — WATER FOR IRRIGATION, STERILE IR SOLN
Status: DC | PRN
  Administered 2024-02-10: 1000 mL

## 2024-02-10 MED ORDER — LIDOCAINE HCL (PF) 1 % IJ SOLN
INTRAMUSCULAR | Status: AC
  Filled 2024-02-10: qty 30

## 2024-02-10 MED ORDER — BUPIVACAINE HCL (PF) 0.25 % IJ SOLN
INTRAMUSCULAR | Status: AC
  Filled 2024-02-10: qty 30

## 2024-02-10 MED ORDER — FENTANYL CITRATE (PF) 100 MCG/2ML IJ SOLN
INTRAMUSCULAR | Status: AC
  Filled 2024-02-10: qty 2

## 2024-02-10 MED ORDER — ONDANSETRON HCL 4 MG/2ML IJ SOLN
INTRAMUSCULAR | Status: AC
  Filled 2024-02-10: qty 2

## 2024-02-10 MED ORDER — HYDROCODONE-ACETAMINOPHEN 5-325 MG PO TABS: 1.0000 | ORAL_TABLET | Freq: Four times a day (QID) | ORAL | 0 refills | Status: AC | PRN

## 2024-02-10 MED ORDER — PROPOFOL 10 MG/ML IV BOLUS
INTRAVENOUS | Status: DC | PRN
  Administered 2024-02-10: 50 ug/kg/min via INTRAVENOUS

## 2024-02-10 MED ORDER — MIDAZOLAM HCL (PF) 2 MG/2ML IJ SOLN
INTRAMUSCULAR | Status: DC | PRN
  Administered 2024-02-10: 2 mg via INTRAVENOUS

## 2024-02-10 MED ORDER — ORAL CARE MOUTH RINSE: 15.0000 mL | Freq: Once | OROMUCOSAL | Status: DC

## 2024-02-10 MED ORDER — PROPOFOL 10 MG/ML IV BOLUS
INTRAVENOUS | Status: AC
  Filled 2024-02-10: qty 20

## 2024-02-10 MED ORDER — LACTATED RINGERS IV SOLN: INTRAVENOUS | Status: DC

## 2024-02-10 MED ORDER — OXYCODONE HCL 5 MG PO TABS: 5.0000 mg | ORAL_TABLET | Freq: Once | ORAL | Status: DC | PRN

## 2024-02-10 MED ORDER — DEXAMETHASONE SOD PHOSPHATE PF 10 MG/ML IJ SOLN
INTRAMUSCULAR | Status: DC | PRN
  Administered 2024-02-10: 4 mg via INTRAVENOUS

## 2024-02-10 MED ORDER — MIDAZOLAM HCL 2 MG/2ML IJ SOLN
INTRAMUSCULAR | Status: AC
  Filled 2024-02-10: qty 2

## 2024-02-10 MED ORDER — ONDANSETRON HCL 4 MG/2ML IJ SOLN
INTRAMUSCULAR | Status: DC | PRN
  Administered 2024-02-10: 4 mg via INTRAVENOUS

## 2024-02-10 MED ORDER — ROCURONIUM BROMIDE 10 MG/ML (PF) SYRINGE
PREFILLED_SYRINGE | INTRAVENOUS | Status: AC
  Filled 2024-02-10: qty 10

## 2024-02-10 MED ORDER — SULFAMETHOXAZOLE-TRIMETHOPRIM 800-160 MG PO TABS: 1.0000 | ORAL_TABLET | Freq: Two times a day (BID) | ORAL | 0 refills | Status: DC

## 2024-02-10 MED ORDER — CHLORHEXIDINE GLUCONATE 0.12 % MT SOLN: 15.0000 mL | Freq: Once | OROMUCOSAL | Status: DC

## 2024-02-10 SURGICAL SUPPLY — 36 items
BLADE SURG SZ11 CARB STEEL (BLADE) ×1 IMPLANT
CHLORAPREP W/TINT 26 (MISCELLANEOUS) ×1 IMPLANT
COVER LIGHT HANDLE STERIS (MISCELLANEOUS) ×2 IMPLANT
DERMABOND ADVANCED .7 DNX12 (GAUZE/BANDAGES/DRESSINGS) ×1 IMPLANT
DRAPE C-ARM 35X43 STRL (DRAPES) ×1 IMPLANT
DRAPE C-ARM FOLDED MOBILE STRL (DRAPES) ×1 IMPLANT
DRAPE C-ARMOR (DRAPES) ×1 IMPLANT
DRAPE INCISE IOBAN 66X45 STRL (DRAPES) ×1 IMPLANT
DRAPE LAPAROSCOPIC ABDOMINAL (DRAPES) ×1 IMPLANT
DRSG TEGADERM 4X4.75 (GAUZE/BANDAGES/DRESSINGS) ×2 IMPLANT
ELECTRODE REM PT RTRN 9FT ADLT (ELECTROSURGICAL) ×1 IMPLANT
EXTENSION NEURO PERCUTANEOUS (NEUROSURGERY SUPPLIES) IMPLANT
GAUZE SPONGE 4X4 12PLY STRL (GAUZE/BANDAGES/DRESSINGS) ×1 IMPLANT
GLOVE BIO SURGEON STRL SZ8 (GLOVE) ×1 IMPLANT
GLOVE BIOGEL PI IND STRL 7.0 (GLOVE) ×2 IMPLANT
GLOVE BIOGEL PI IND STRL 8 (GLOVE) ×1 IMPLANT
GOWN STRL REUS W/TWL LRG LVL3 (GOWN DISPOSABLE) ×1 IMPLANT
GOWN STRL REUS W/TWL XL LVL3 (GOWN DISPOSABLE) ×1 IMPLANT
KIT LEAD TINED W/2 NDL GUIDE (NEUROSURGERY SUPPLIES) IMPLANT
KIT LEAD TINED W/STYLET (Lead) IMPLANT
KIT TURNOVER CYSTO (KITS) ×1 IMPLANT
NDL HYPO 21X1.5 SAFETY (NEEDLE) ×1 IMPLANT
NEEDLE HYPO 21X1.5 SAFETY (NEEDLE) ×1 IMPLANT
NEUROSTIMULATOR 1.7X2X.06 (UROLOGICAL SUPPLIES) ×1 IMPLANT
PACK MINOR (CUSTOM PROCEDURE TRAY) ×1 IMPLANT
PAD ARMBOARD POSITIONER FOAM (MISCELLANEOUS) ×1 IMPLANT
POSITIONER HEAD 8X9X4 ADT (SOFTGOODS) ×1 IMPLANT
REMOTE CONTROL NEURO PATIENT (NEUROSURGERY SUPPLIES) IMPLANT
SET BASIN LINEN APH (SET/KITS/TRAYS/PACK) ×1 IMPLANT
STIMULATOR EXTERNAL TRIAL (NEUROSURGERY SUPPLIES) IMPLANT
STRIP CLOSURE SKIN 1/4X3 (GAUZE/BANDAGES/DRESSINGS) IMPLANT
SUT MNCRL AB 4-0 PS2 18 (SUTURE) ×1 IMPLANT
SUT VIC AB 3-0 SH 27X BRD (SUTURE) ×1 IMPLANT
SYR BULB IRRIG 60ML STRL (SYRINGE) ×1 IMPLANT
SYR CONTROL 10ML LL (SYRINGE) ×1 IMPLANT
WATER STERILE IRR 500ML POUR (IV SOLUTION) ×1 IMPLANT

## 2024-02-10 NOTE — Interval H&P Note (Signed)
 History and Physical Interval Note:  02/10/2024 11:33 AM  Christopher Conner  has presented today for surgery, with the diagnosis of Overactive bladder.  The various methods of treatment have been discussed with the patient and family. After consideration of risks, benefits and other options for treatment, the patient has consented to  Procedure(s): INSERTION, SACRAL NERVE STIMULATOR, INTERSTIM, STAGE 1 ([name]) as a surgical intervention.  The patient's history has been reviewed, patient examined, no change in status, stable for surgery.  I have reviewed the patient's chart and labs.  Questions were answered to the patient's satisfaction.     [name]

## 2024-02-10 NOTE — Anesthesia Preprocedure Evaluation (Signed)
 Anesthesia Evaluation  Patient identified by MRN, date of birth, ID band Patient awake    Reviewed: Allergy & Precautions, H&P , NPO status , Patient's Chart, lab work & pertinent test results, reviewed documented beta blocker date and time   Airway Mallampati: II  TM Distance: >3 FB Neck ROM: full    Dental no notable dental hx.    Pulmonary neg pulmonary ROS   Pulmonary exam normal breath sounds clear to auscultation       Cardiovascular Exercise Tolerance: Good hypertension, + Valvular Problems/Murmurs  Rhythm:regular Rate:Normal     Neuro/Psych  Neuromuscular disease  negative psych ROS   GI/Hepatic negative GI ROS, Neg liver ROS,,,  Endo/Other  diabetes    Renal/GU negative Renal ROS  negative genitourinary   Musculoskeletal   Abdominal   Peds  Hematology  (+) Blood dyscrasia, anemia   Anesthesia Other Findings   Reproductive/Obstetrics negative OB ROS                              Anesthesia Physical Anesthesia Plan  ASA: 3  Anesthesia Plan: General   Post-op Pain Management:    Induction:   PONV Risk Score and Plan: Propofol  infusion  Airway Management Planned:   Additional Equipment:   Intra-op Plan:   Post-operative Plan:   Informed Consent: I have reviewed the patients History and Physical, chart, labs and discussed the procedure including the risks, benefits and alternatives for the proposed anesthesia with the patient or authorized representative who has indicated his/her understanding and acceptance.     Dental Advisory Given  Plan Discussed with: CRNA  Anesthesia Plan Comments:         Anesthesia Quick Evaluation

## 2024-02-10 NOTE — Transfer of Care (Signed)
 Immediate Anesthesia Transfer of Care Note  Patient: Christopher Conner  Procedure(s) Performed: INSERTION, SACRAL NERVE STIMULATOR, INTERSTIM, STAGE 1  Patient Location: PACU  Anesthesia Type:MAC  Level of Consciousness: awake, alert , oriented, and patient cooperative  Airway & Oxygen Therapy: Patient Spontanous Breathing  Post-op Assessment: Report given to RN and Post -op Vital signs reviewed and stable  Post vital signs: Reviewed and stable  Last Vitals:  Vitals Value Taken Time  BP 174/62 02/10/24 13:22  Temp 36.5 C 02/10/24 13:23  Pulse 68 02/10/24 13:29  Resp 17 02/10/24 13:29  SpO2 99 % 02/10/24 13:29  Vitals shown include unfiled device data.  Last Pain:  Vitals:   02/10/24 1026  TempSrc: Oral  PainSc: 0-No pain      Patients Stated Pain Goal: 4 (02/10/24 1026)  Complications: No notable events documented.

## 2024-02-10 NOTE — Op Note (Addendum)
 Preoperative diagnosis: overactive bladder   Postoperative diagnosis: Same   Procedure: Placement of Axonics stage I and impedance check   Surgeon: Dr. [name]   Assistant: None   Antibiotics: Ancef    Drains: None   Indications: The patient is a 73yo with a hx of overactive bladder refractory to medical therapy. After discussing treatment options he has elected to proceed with Axonics Stage 1 implantation   Procedure in detail: Prior to the procedure consent was obtained. The patient was brought to the operating room and a brief time out was completed to ensure correct patient, correct procedure and correct site. Preoperative antibiotics were given. Extra care was taken positioning the patient in a prone position. Usual skin preparation was utilized.   Using lateral and AP fluoroscopy I marked S3. After several minutes of testing I was in the S4 foramina with a bellows response and the foramina was below the bone knuckle noted on x-ray. Eventually I was in S3 on the patient's left side above the knuckle.   Approximate 12 mL of a lidocaine  epinephrine mixture was utilized in the midline. Bony table was anesthetized. The 5 inch foramen needle was introduced into the S3 foramina as noted above. At very low setting she had an excellent bellows and toe response   The inner aspect of the foramen needle was removed and the guide was placed to the appropriate depth removing the framing needle. Under fluoroscopic guidance after making a 1 cm incision the white trocar was passed to the appropriate depth. The lead with a hockey stick bend was placed to the appropriate depth just bridging the bone medially. She had excellent bellows and toe response at all 4 settings.   Under fluoroscopic guidance the inner aspect of the lead was removed. X-rays were taken.   A 2 cm right upper mid buttock incision was made. 10 mL of a lidocaine  epinephrine mixture were utilized. It was carried down to  appropriate depth and a made an appropriate sized pocket.   With the passer I passed the lead from medial to lateral and connected. We then passed the lead across the midline and to the right upper buttock. The leas was then brought through this incision. The incisions were then closed with running 3-0 vicryl.   Impedance check was done utilizing sterile technique and was normal in all 4 positions We then placed dermabond on the incisions and this concluded the procedure which was well tolerated by the patient.   Complications: none   Condition: stable, transferred to PACU   Plan: The patient is to be discharged home after he voids in the PACU.

## 2024-02-11 ENCOUNTER — Telehealth: Payer: Self-pay

## 2024-02-11 ENCOUNTER — Encounter (HOSPITAL_COMMUNITY): Payer: Self-pay | Admitting: Urology

## 2024-02-11 NOTE — Anesthesia Postprocedure Evaluation (Signed)
 Anesthesia Post Note  Patient: Christopher Conner  Procedure(s) Performed: INSERTION, SACRAL NERVE STIMULATOR, INTERSTIM, STAGE 1  Patient location during evaluation: Phase II Anesthesia Type: General Level of consciousness: awake Pain management: pain level controlled Vital Signs Assessment: post-procedure vital signs reviewed and stable Respiratory status: spontaneous breathing and respiratory function stable Cardiovascular status: blood pressure returned to baseline and stable Postop Assessment: no headache and no apparent nausea or vomiting Anesthetic complications: no Comments: Late entry   No notable events documented.   Last Vitals:  Vitals:   02/10/24 1350 02/10/24 1351  BP:  (!) 173/58  Pulse: 67 66  Resp: 15 18  Temp:  36.4 C  SpO2: 99% 97%    Last Pain:  Vitals:   02/11/24 1243  TempSrc:   PainSc: 0-No pain                 [name]

## 2024-02-11 NOTE — Telephone Encounter (Signed)
 Pt called with questions and concerns regarding his incision pt states the gauze is saturated in blood where the battery is on his sns per verbal from MD McKenzie tell pt as long as incision is not leaking okay to leave dressing on pt voiced his understanding

## 2024-02-15 ENCOUNTER — Other Ambulatory Visit: Payer: Self-pay | Admitting: Urology

## 2024-02-15 ENCOUNTER — Other Ambulatory Visit: Payer: Self-pay

## 2024-02-15 DIAGNOSIS — R350 Frequency of micturition: Secondary | ICD-10-CM

## 2024-02-15 DIAGNOSIS — R351 Nocturia: Secondary | ICD-10-CM

## 2024-02-16 ENCOUNTER — Encounter: Payer: Self-pay | Admitting: Urology

## 2024-02-16 ENCOUNTER — Ambulatory Visit (INDEPENDENT_AMBULATORY_CARE_PROVIDER_SITE_OTHER): Admitting: Urology

## 2024-02-16 VITALS — BP 130/64 | HR 58

## 2024-02-16 DIAGNOSIS — N3281 Overactive bladder: Secondary | ICD-10-CM

## 2024-02-16 DIAGNOSIS — R350 Frequency of micturition: Secondary | ICD-10-CM

## 2024-02-16 NOTE — Progress Notes (Signed)
 02/16/2024 11:58 AM   Christopher Conner 1951/02/25 [id number]  Referring provider: Oris Camie BRAVO, NP [address] [place],  [place] [address]  No chief complaint on file.   HPI: [name] is a 73yo here for followup after Stage 1 Axonics. He notes his baseline urinary urgency has slightly improved. He continues to have urinary frequency but no urge incontinence. Nocturia every 2 hours which is improved from every 60-75 minutes.    PMH: Past Medical History:  Diagnosis Date   Anemia 07/15/2023   Arthritis    Bilateral foot-drop    after back surgery   BPH (benign prostatic hyperplasia)    Cataract    Chronic lymphocytic leukemia (HCC) early 2018   no current treatment   CLL (chronic lymphocytic leukemia) (HCC)    DM type 1 (diabetes mellitus, type 1) (HCC)    type 1   Goals of care, counseling/discussion 07/18/2023   High cholesterol    Hypertension    Murmur, cardiac 07/15/2023   Neuromuscular disorder (HCC)    diabetic neuropathy   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   Scattered rhonchi of right lung 11/22/2022    Surgical History: Past Surgical History:  Procedure Laterality Date   APPENDECTOMY  early 20's   BACK SURGERY     BRAIN SURGERY  10/30/04   brain shunt, Codman Hakim   CATARACT EXTRACTION Bilateral    left eye prostheisi shell   CYST REMOVAL NECK  yrs ago   CYSTOSCOPY [name] 04/22/2023   Procedure: CYSTOSCOPY;  Surgeon: Sherrilee Belvie CROME, MD;  Location: [facility];  Service: Urology;  Laterality: [name];   CYSTOSCOPY WITH INJECTION [name] 12/17/2022   Procedure: CYSTOSCOPY WITH INJECTION-Botox ;  Surgeon: Sherrilee Belvie CROME, MD;  Location: [facility];  Service: Urology;  Laterality: [name];   CYSTOSCOPY WITH INSERTION OF UROLIFT  april/march 2020   done in office   CYSTOSCOPY WITH INSERTION OF UROLIFT [name] 10/12/2019   Procedure: CYSTOSCOPY WITH INSERTION OF UROLIFT;  Surgeon: Sherrilee Belvie CROME, MD;  Location: [facility];  Service: Urology;  Laterality: [name];   EYE SURGERY     INTERSTIM IMPLANT PLACEMENT [name] 02/10/2024   Procedure: INSERTION, SACRAL NERVE STIMULATOR, INTERSTIM, STAGE 1;  Surgeon: Sherrilee Belvie CROME, MD;  Location: [facility];  Service: Urology;  Laterality: [name];   LEFT HEART CATH AND CORONARY ANGIOGRAPHY [name] 10/05/2023   Procedure: LEFT HEART CATH AND CORONARY ANGIOGRAPHY;  Surgeon: Jordan, Peter M, MD;  Location: [facility] INVASIVE CV LAB;  Service: Cardiovascular;  Laterality: [name];   LUMBAR FUSION  1998 and 2000   l5 to s1 1998, then l4 and l5 in 2000   SPINE SURGERY     H. Elsner, L4-5 & L5-S1   TRANSURETHRAL RESECTION OF PROSTATE [name] 04/22/2023   Procedure: TRANSURETHRAL RESECTION OF THE PROSTATE (TURP);  Surgeon: Sherrilee Belvie CROME, MD;  Location: [facility];  Service: Urology;  Laterality: [name];   VENTRICULOPERITONEAL SHUNT  2006    Home Medications:  Allergies as of 02/16/2024       Reactions   Lisinopril  Cough   Latex Itching, Rash        Medication List        Accurate as of February 16, 2024 11:58 AM. If you have any questions, ask your nurse or doctor.          ACETYL L-CARNITINE PO Take 500 mg by mouth in the morning.   acidophilus Caps capsule Take 1 capsule by  mouth every evening.   amLODipine  10 MG tablet Commonly known as: NORVASC  Take 1 tablet (10 mg total) by mouth in the morning.   ammonium lactate  12 % lotion Commonly known as: Amlactin Daily Apply 1 Application topically as needed.   aspirin  EC 81 MG tablet Take 1 tablet (81 mg total) by mouth daily. Swallow whole.   atorvastatin  80 MG tablet Commonly known as: LIPITOR  Take 1 tablet (80 mg total) by mouth every evening.   B-100 PO Take 1 tablet by mouth in the morning.   carvedilol  3.125 MG tablet Commonly known as: COREG  Take 1 tablet (3.125 mg total) by mouth 2 (two) times daily with a meal.   CoQ-10 100 MG Caps Take 100 mg by mouth daily.   fesoterodine  8 MG Tb24 tablet Commonly known  as: TOVIAZ  Take 1 tablet (8 mg total) by mouth daily.   folic acid 1 MG tablet Commonly known as: FOLVITE Take 1 mg by mouth daily.   hydrOXYzine  10 MG tablet Commonly known as: ATARAX  Take 1 tablet (10 mg total) by mouth at bedtime.   methotrexate 2.5 MG tablet Commonly known as: RHEUMATREX Take 20 mg by mouth every Saturday.   multivitamin with minerals Tabs tablet Take 1 tablet by mouth every evening.   nitroGLYCERIN  0.4 MG SL tablet Commonly known as: NITROSTAT  Place 1 tablet (0.4 mg total) under the tongue every 5 (five) minutes as needed for chest pain.   NovoLOG  100 UNIT/ML injection Generic drug: insulin  aspart INJECT UP TO 100 UNITS DAILY MAXIMUM DOSE WITH INSULIN  PUMP DAILY   OMEGA 3 PO Take 1 capsule by mouth daily.   pantoprazole  40 MG tablet Commonly known as: PROTONIX  Take 40 mg by mouth daily.   REFRESH OP Place 1-2 drops into the right eye every 2 (two) hours as needed (dryness).   sulfamethoxazole-trimethoprim 800-160 MG tablet Commonly known as: BACTRIM DS Take 1 tablet by mouth 2 (two) times daily.   telmisartan  40 MG tablet Commonly known as: MICARDIS  Take 1 tablet (40 mg total) by mouth in the morning.   Vitamin D 50 MCG (2000 UT) Caps Take 2,000 Units by mouth in the morning.        Allergies:  Allergies  Allergen Reactions   Lisinopril  Cough   Latex Itching and Rash    Family History: Family History  Problem Relation Age of Onset   Hypertension Mother    Hyperlipidemia Mother    Diabetes Mother    Diabetes Father     Social History:  reports that he has never smoked. He has never used smokeless tobacco. He reports that he does not drink alcohol and does not use drugs.  ROS: All other review of systems were reviewed and are negative except what is noted above in HPI  Physical Exam: BP 130/64   Pulse (!) 58   Constitutional:  Alert and oriented, No acute distress. HEENT: [place] AT, moist mucus membranes.  Trachea midline, no  masses. Cardiovascular: No clubbing, cyanosis, or edema. Respiratory: Normal respiratory effort, no increased work of breathing. GI: Abdomen is soft, nontender, nondistended, no abdominal masses GU: No CVA tenderness.  Lymph: No cervical or inguinal lymphadenopathy. Skin: No rashes, bruises or suspicious lesions. Neurologic: Grossly intact, no focal deficits, moving all 4 extremities. Psychiatric: Normal mood and affect.  Laboratory Data: Lab Results  Component Value Date   WBC 17.0 (H) 02/07/2024   HGB 12.8 (L) 02/07/2024   HCT 37.5 (L) 02/07/2024   MCV 99.7 02/07/2024  PLT 280 02/07/2024    Lab Results  Component Value Date   CREATININE 0.66 02/07/2024    No results found for: PSA  No results found for: TESTOSTERONE  Lab Results  Component Value Date   HGBA1C 5.8 (H) 02/07/2024    Urinalysis    Component Value Date/Time   COLORURINE YELLOW 08/16/2015 0244   APPEARANCEUR Clear 12/13/2023 1400   LABSPEC 1.029 08/16/2015 0244   PHURINE 5.5 08/16/2015 0244   GLUCOSEU Negative 12/13/2023 1400   HGBUR NEGATIVE 08/16/2015 0244   BILIRUBINUR Negative 12/13/2023 1400   KETONESUR >80 (A) 08/16/2015 0244   PROTEINUR Negative 12/13/2023 1400   PROTEINUR NEGATIVE 08/16/2015 0244   UROBILINOGEN 0.2 04/09/2014 1947   NITRITE Negative 12/13/2023 1400   NITRITE NEGATIVE 08/16/2015 0244   LEUKOCYTESUR Negative 12/13/2023 1400    Lab Results  Component Value Date   LABMICR Comment 12/13/2023   WBCUA >30 (A) 06/02/2023   LABEPIT 0-10 06/02/2023   BACTERIA Few (A) 06/02/2023    Pertinent Imaging:  Results for orders placed during the hospital encounter of 05/23/10  DG Abd 1 View  Narrative *RADIOLOGY REPORT*  Clinical Data: Weakness with nausea and vomiting.  ABDOMEN - 1 VIEW  Comparison: 02/10/2010.  Findings: Ventriculoperitoneal shunt tubing extends into the right mid abdomen.  There is no shunt discontinuity.  Moderate stool is noted throughout the  colon.  The bowel gas pattern is nonobstructive.  There are postsurgical changes of the lower lumbar spine status post fusion.  IMPRESSION:  1.  Moderate stool throughout the colon suggesting constipation. 2.  Intact ventricular peritoneal shunt.  Original Report Authenticated By: ELSIE WENDI PERONE, M.D.  No results found for this or any previous visit.  No results found for this or any previous visit.  No results found for this or any previous visit.  No results found for this or any previous visit.  No results found for this or any previous visit.  No results found for this or any previous visit.  No results found for this or any previous visit.   Assessment & Plan:    1. Urinary frequency (Primary) Continue with stage 2 axonics.    No follow-ups on file.  [name], MD  [facility]

## 2024-02-16 NOTE — Patient Instructions (Signed)

## 2024-02-16 NOTE — H&P (View-Only) (Signed)
 02/16/2024 11:58 AM   Christopher Conner 1951/02/25 [id number]  Referring provider: Oris Camie BRAVO, NP [address] [place],  [place] [address]  No chief complaint on file.   HPI: Christopher Conner is a 73yo here for followup after Stage 1 Axonics. He notes his baseline urinary urgency has slightly improved. He continues to have urinary frequency but no urge incontinence. Nocturia every 2 hours which is improved from every 60-75 minutes.    PMH: Past Medical History:  Diagnosis Date   Anemia 07/15/2023   Arthritis    Bilateral foot-drop    after back surgery   BPH (benign prostatic hyperplasia)    Cataract    Chronic lymphocytic leukemia (HCC) early 2018   no current treatment   CLL (chronic lymphocytic leukemia) (HCC)    DM type 1 (diabetes mellitus, type 1) (HCC)    type 1   Goals of care, counseling/discussion 07/18/2023   High cholesterol    Hypertension    Murmur, cardiac 07/15/2023   Neuromuscular disorder (HCC)    diabetic neuropathy   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   Scattered rhonchi of right lung 11/22/2022    Surgical History: Past Surgical History:  Procedure Laterality Date   APPENDECTOMY  early 20's   BACK SURGERY     BRAIN SURGERY  10/30/04   brain shunt, Codman Hakim   CATARACT EXTRACTION Bilateral    left eye prostheisi shell   CYST REMOVAL NECK  yrs ago   CYSTOSCOPY [name] 04/22/2023   Procedure: CYSTOSCOPY;  Surgeon: Sherrilee Belvie CROME, MD;  Location: [facility];  Service: Urology;  Laterality: [name];   CYSTOSCOPY WITH INJECTION [name] 12/17/2022   Procedure: CYSTOSCOPY WITH INJECTION-Botox ;  Surgeon: Sherrilee Belvie CROME, MD;  Location: [facility];  Service: Urology;  Laterality: [name];   CYSTOSCOPY WITH INSERTION OF UROLIFT  april/march 2020   done in office   CYSTOSCOPY WITH INSERTION OF UROLIFT [name] 10/12/2019   Procedure: CYSTOSCOPY WITH INSERTION OF UROLIFT;  Surgeon: Sherrilee Belvie CROME, MD;  Location: [facility];  Service: Urology;  Laterality: [name];   EYE SURGERY     INTERSTIM IMPLANT PLACEMENT [name] 02/10/2024   Procedure: INSERTION, SACRAL NERVE STIMULATOR, INTERSTIM, STAGE 1;  Surgeon: Sherrilee Belvie CROME, MD;  Location: [facility];  Service: Urology;  Laterality: [name];   LEFT HEART CATH AND CORONARY ANGIOGRAPHY [name] 10/05/2023   Procedure: LEFT HEART CATH AND CORONARY ANGIOGRAPHY;  Surgeon: Jordan, Peter M, MD;  Location: [facility] IN[facility] CV LAB;  Service: Cardiovascular;  Laterality: [name];   LUMBAR FUSION  1998 and 2000   l5 to s1 1998, then l4 and l5 in 2000   SPINE SURGERY     H. Elsner, L4-5 & L5-S1   TRANSURETHRAL RESECTION OF PROSTATE [name] 04/22/2023   Procedure: TRANSURETHRAL RESECTION OF THE PROSTATE (TURP);  Surgeon: Sherrilee Belvie CROME, MD;  Location: [facility];  Service: Urology;  Laterality: [name];   VENTRICULOPERITONEAL SHUNT  2006    Home Medications:  Allergies as of 02/16/2024       Reactions   Lisinopril  Cough   Latex Itching, Rash        Medication List        Accurate as of February 16, 2024 11:58 AM. If you have any questions, ask your nurse or doctor.          ACETYL L-CARNITINE PO Take 500 mg by mouth in the morning.   acidophilus Caps capsule Take 1 capsule by  mouth every evening.   amLODipine  10 MG tablet Commonly known as: NORVASC  Take 1 tablet (10 mg total) by mouth in the morning.   ammonium lactate  12 % lotion Commonly known as: Amlactin Daily Apply 1 Application topically as needed.   aspirin  EC 81 MG tablet Take 1 tablet (81 mg total) by mouth daily. Swallow whole.   atorvastatin  80 MG tablet Commonly known as: LIPITOR  Take 1 tablet (80 mg total) by mouth every evening.   B-100 PO Take 1 tablet by mouth in the morning.   carvedilol  3.125 MG tablet Commonly known as: COREG  Take 1 tablet (3.125 mg total) by mouth 2 (two) times daily with a meal.   CoQ-10 100 MG Caps Take 100 mg by mouth daily.   fesoterodine  8 MG Tb24 tablet Commonly known  as: TOVIAZ  Take 1 tablet (8 mg total) by mouth daily.   folic acid 1 MG tablet Commonly known as: FOLVITE Take 1 mg by mouth daily.   hydrOXYzine  10 MG tablet Commonly known as: ATARAX  Take 1 tablet (10 mg total) by mouth at bedtime.   methotrexate 2.5 MG tablet Commonly known as: RHEUMATREX Take 20 mg by mouth every Saturday.   multivitamin with minerals Tabs tablet Take 1 tablet by mouth every evening.   nitroGLYCERIN  0.4 MG SL tablet Commonly known as: NITROSTAT  Place 1 tablet (0.4 mg total) under the tongue every 5 (five) minutes as needed for chest pain.   NovoLOG  100 UNIT/ML injection Generic drug: insulin  aspart INJECT UP TO 100 UNITS DAILY MAXIMUM DOSE WITH INSULIN  PUMP DAILY   OMEGA 3 PO Take 1 capsule by mouth daily.   pantoprazole  40 MG tablet Commonly known as: PROTONIX  Take 40 mg by mouth daily.   REFRESH OP Place 1-2 drops into the right eye every 2 (two) hours as needed (dryness).   sulfamethoxazole-trimethoprim 800-160 MG tablet Commonly known as: BACTRIM DS Take 1 tablet by mouth 2 (two) times daily.   telmisartan  40 MG tablet Commonly known as: MICARDIS  Take 1 tablet (40 mg total) by mouth in the morning.   Vitamin D 50 MCG (2000 UT) Caps Take 2,000 Units by mouth in the morning.        Allergies:  Allergies  Allergen Reactions   Lisinopril  Cough   Latex Itching and Rash    Family History: Family History  Problem Relation Age of Onset   Hypertension Mother    Hyperlipidemia Mother    Diabetes Mother    Diabetes Father     Social History:  reports that he has never smoked. He has never used smokeless tobacco. He reports that he does not drink alcohol and does not use drugs.  ROS: All other review of systems were reviewed and are negative except what is noted above in HPI  Physical Exam: BP 130/64   Pulse (!) 58   Constitutional:  Alert and oriented, No acute distress. HEENT: [place] AT, moist mucus membranes.  Trachea midline, no  masses. Cardiovascular: No clubbing, cyanosis, or edema. Respiratory: Normal respiratory effort, no increased work of breathing. GI: Abdomen is soft, nontender, nondistended, no abdominal masses GU: No CVA tenderness.  Lymph: No cervical or inguinal lymphadenopathy. Skin: No rashes, bruises or suspicious lesions. Neurologic: Grossly intact, no focal deficits, moving all 4 extremities. Psychiatric: Normal mood and affect.  Laboratory Data: Lab Results  Component Value Date   WBC 17.0 (H) 02/07/2024   HGB 12.8 (L) 02/07/2024   HCT 37.5 (L) 02/07/2024   MCV 99.7 02/07/2024  PLT 280 02/07/2024    Lab Results  Component Value Date   CREATININE 0.66 02/07/2024    No results found for: PSA  No results found for: TESTOSTERONE  Lab Results  Component Value Date   HGBA1C 5.8 (H) 02/07/2024    Urinalysis    Component Value Date/Time   COLORURINE YELLOW 08/16/2015 0244   APPEARANCEUR Clear 12/13/2023 1400   LABSPEC 1.029 08/16/2015 0244   PHURINE 5.5 08/16/2015 0244   GLUCOSEU Negative 12/13/2023 1400   HGBUR NEGATIVE 08/16/2015 0244   BILIRUBINUR Negative 12/13/2023 1400   KETONESUR >80 (A) 08/16/2015 0244   PROTEINUR Negative 12/13/2023 1400   PROTEINUR NEGATIVE 08/16/2015 0244   UROBILINOGEN 0.2 04/09/2014 1947   NITRITE Negative 12/13/2023 1400   NITRITE NEGATIVE 08/16/2015 0244   LEUKOCYTESUR Negative 12/13/2023 1400    Lab Results  Component Value Date   LABMICR Comment 12/13/2023   WBCUA >30 (A) 06/02/2023   LABEPIT 0-10 06/02/2023   BACTERIA Few (A) 06/02/2023    Pertinent Imaging:  Results for orders placed during the hospital encounter of 05/23/10  DG Abd 1 View  Narrative *RADIOLOGY REPORT*  Clinical Data: Weakness with nausea and vomiting.  ABDOMEN - 1 VIEW  Comparison: 02/10/2010.  Findings: Ventriculoperitoneal shunt tubing extends into the right mid abdomen.  There is no shunt discontinuity.  Moderate stool is noted throughout the  colon.  The bowel gas pattern is nonobstructive.  There are postsurgical changes of the lower lumbar spine status post fusion.  IMPRESSION:  1.  Moderate stool throughout the colon suggesting constipation. 2.  Intact ventricular peritoneal shunt.  Original Report Authenticated By: ELSIE WENDI PERONE, M.D.  No results found for this or any previous visit.  No results found for this or any previous visit.  No results found for this or any previous visit.  No results found for this or any previous visit.  No results found for this or any previous visit.  No results found for this or any previous visit.  No results found for this or any previous visit.   Assessment & Plan:    1. Urinary frequency (Primary) Continue with stage 2 axonics.    No follow-ups on file.  [name], MD  [facility]

## 2024-02-22 ENCOUNTER — Encounter (HOSPITAL_COMMUNITY)
Admission: RE | Admit: 2024-02-22 | Discharge: 2024-02-22 | Disposition: A | Discharge status: Home / Self Care | Source: Ambulatory Visit | Attending: Urology | Admitting: Urology

## 2024-02-22 ENCOUNTER — Encounter (HOSPITAL_COMMUNITY): Payer: Self-pay

## 2024-02-24 ENCOUNTER — Ambulatory Visit (HOSPITAL_COMMUNITY)
Admission: RE | Admit: 2024-02-24 | Discharge: 2024-02-24 | Disposition: A | Discharge status: Home / Self Care | Attending: Urology | Admitting: Urology

## 2024-02-24 ENCOUNTER — Other Ambulatory Visit: Payer: Self-pay

## 2024-02-24 ENCOUNTER — Encounter: Admission: RE | Disposition: A | Payer: Self-pay | Source: Home / Self Care | Attending: Urology

## 2024-02-24 ENCOUNTER — Ambulatory Visit (HOSPITAL_COMMUNITY): Admitting: Anesthesiology

## 2024-02-24 ENCOUNTER — Encounter (HOSPITAL_COMMUNITY): Payer: Self-pay | Admitting: Urology

## 2024-02-24 DIAGNOSIS — I10 Essential (primary) hypertension: Secondary | ICD-10-CM

## 2024-02-24 DIAGNOSIS — N3281 Overactive bladder: Secondary | ICD-10-CM | POA: Insufficient documentation

## 2024-02-24 DIAGNOSIS — M1990 Unspecified osteoarthritis, unspecified site: Secondary | ICD-10-CM | POA: Diagnosis not present

## 2024-02-24 DIAGNOSIS — D649 Anemia, unspecified: Secondary | ICD-10-CM | POA: Diagnosis not present

## 2024-02-24 DIAGNOSIS — E119 Type 2 diabetes mellitus without complications: Secondary | ICD-10-CM | POA: Insufficient documentation

## 2024-02-24 LAB — GLUCOSE, CAPILLARY
Glucose-Capillary: 94 mg/dL (ref 70–99)
Glucose-Capillary: 113 mg/dL — ABNORMAL HIGH (ref 70–99)

## 2024-02-24 SURGERY — INSERTION, SACRAL NERVE STIMULATOR, INTERSTIM, STAGE 2
Anesthesia: General | Site: Back

## 2024-02-24 MED ORDER — CHLORHEXIDINE GLUCONATE 0.12 % MT SOLN
OROMUCOSAL | Status: DC
Start: 2024-02-24 — End: 2024-02-24
  Filled 2024-02-24: qty 15

## 2024-02-24 MED ORDER — PROPOFOL 500 MG/50ML IV EMUL
INTRAVENOUS | Status: DC | PRN
Start: 2024-02-24 — End: 2024-02-24
  Administered 2024-02-24: 100 ug/kg/min via INTRAVENOUS

## 2024-02-24 MED ORDER — ACETAMINOPHEN 160 MG/5ML PO SOLN
960.0000 mg | Freq: Once | ORAL | Status: AC
  Filled 2024-02-24: qty 30

## 2024-02-24 MED ORDER — LACTATED RINGERS IV SOLN: INTRAVENOUS | Status: DC

## 2024-02-24 MED ORDER — CEFAZOLIN SODIUM-DEXTROSE 2-4 GM/100ML-% IV SOLN
2.0000 g | INTRAVENOUS | Status: AC
  Administered 2024-02-24: 2 g via INTRAVENOUS
  Filled 2024-02-24: qty 100

## 2024-02-24 MED ORDER — LIDOCAINE 2% (20 MG/ML) 5 ML SYRINGE
INTRAMUSCULAR | Status: AC
  Filled 2024-02-24: qty 5

## 2024-02-24 MED ORDER — PROPOFOL 500 MG/50ML IV EMUL
INTRAVENOUS | Status: AC
Start: 2024-02-24 — End: 2024-02-24
  Filled 2024-02-24: qty 50

## 2024-02-24 MED ORDER — ROCURONIUM BROMIDE 10 MG/ML (PF) SYRINGE
PREFILLED_SYRINGE | INTRAVENOUS | Status: AC
  Filled 2024-02-24: qty 10

## 2024-02-24 MED ORDER — SULFAMETHOXAZOLE-TRIMETHOPRIM 800-160 MG PO TABS: 1.0000 | ORAL_TABLET | Freq: Two times a day (BID) | ORAL | 0 refills | Status: DC

## 2024-02-24 MED ORDER — OXYCODONE HCL 5 MG/5ML PO SOLN: 5.0000 mg | Freq: Once | ORAL | Status: DC | PRN

## 2024-02-24 MED ORDER — ORAL CARE MOUTH RINSE: 15.0000 mL | Freq: Once | OROMUCOSAL | Status: DC

## 2024-02-24 MED ORDER — LIDOCAINE HCL 1 % IJ SOLN
INTRAMUSCULAR | Status: DC | PRN
  Administered 2024-02-24: 7 mL via INTRADERMAL

## 2024-02-24 MED ORDER — STERILE WATER FOR IRRIGATION IR SOLN
Status: DC | PRN
  Administered 2024-02-24: 500 mL

## 2024-02-24 MED ORDER — ONDANSETRON HCL 4 MG/2ML IJ SOLN
INTRAMUSCULAR | Status: AC
  Filled 2024-02-24: qty 2

## 2024-02-24 MED ORDER — OXYCODONE HCL 5 MG PO TABS: 5.0000 mg | ORAL_TABLET | Freq: Once | ORAL | Status: DC | PRN

## 2024-02-24 MED ORDER — ACETAMINOPHEN 500 MG PO TABS
1000.0000 mg | ORAL_TABLET | Freq: Once | ORAL | Status: AC
  Administered 2024-02-24: 1000 mg via ORAL
  Filled 2024-02-24: qty 2

## 2024-02-24 MED ORDER — LIDOCAINE HCL 1 % IJ SOLN
INTRAMUSCULAR | Status: DC | PRN
Start: 2024-02-24 — End: 2024-02-24
  Administered 2024-02-24: 50 mg via INTRADERMAL

## 2024-02-24 MED ORDER — FENTANYL CITRATE (PF) 50 MCG/ML IJ SOSY: 25.0000 ug | PREFILLED_SYRINGE | INTRAMUSCULAR | Status: DC | PRN

## 2024-02-24 MED ORDER — LIDOCAINE HCL (PF) 1 % IJ SOLN
INTRAMUSCULAR | Status: AC
  Filled 2024-02-24: qty 30

## 2024-02-24 MED ORDER — FENTANYL CITRATE (PF) 100 MCG/2ML IJ SOLN
INTRAMUSCULAR | Status: DC | PRN
  Administered 2024-02-24: 25 ug via INTRAVENOUS

## 2024-02-24 MED ORDER — CHLORHEXIDINE GLUCONATE 0.12 % MT SOLN: 15.0000 mL | Freq: Once | OROMUCOSAL | Status: DC

## 2024-02-24 MED ORDER — FENTANYL CITRATE (PF) 100 MCG/2ML IJ SOLN
INTRAMUSCULAR | Status: AC
  Filled 2024-02-24: qty 2

## 2024-02-24 MED ORDER — HYDROCODONE-ACETAMINOPHEN 5-325 MG PO TABS: 1.0000 | ORAL_TABLET | Freq: Four times a day (QID) | ORAL | 0 refills | Status: AC | PRN

## 2024-02-24 SURGICAL SUPPLY — 24 items
AXONICS CHARGING SYSTEM ×1 IMPLANT
CHARGER BATTERY NEUROSTIM 1401 (NEUROSURGERY SUPPLIES) IMPLANT
COVER LIGHT HANDLE (MISCELLANEOUS) IMPLANT
DERMABOND ADVANCED .7 DNX12 (GAUZE/BANDAGES/DRESSINGS) ×1 IMPLANT
ELECTRODE REM PT RTRN 9FT ADLT (ELECTROSURGICAL) IMPLANT
GAUZE 4X4 16PLY ~~LOC~~+RFID DBL (SPONGE) ×1 IMPLANT
GAUZE SPONGE 4X4 12PLY STRL (GAUZE/BANDAGES/DRESSINGS) ×1 IMPLANT
GLOVE BIO SURGEON STRL SZ8 (GLOVE) ×1 IMPLANT
GLOVE BIOGEL PI IND STRL 7.0 (GLOVE) ×2 IMPLANT
GOWN STRL REUS W/TWL LRG LVL3 (GOWN DISPOSABLE) ×1 IMPLANT
GOWN STRL REUS W/TWL XL LVL3 (GOWN DISPOSABLE) ×1 IMPLANT
KIT HANDSET INTERSTIM COMM (NEUROSURGERY SUPPLIES) ×1 IMPLANT
KIT TURNOVER CYSTO (KITS) ×1 IMPLANT
NDL HYPO 21X1.5 SAFETY (NEEDLE) IMPLANT
NEEDLE HYPO 21X1.5 SAFETY (NEEDLE) ×1 IMPLANT
NEUROSTIM RECHARGE 5101 (Neurostimulator) IMPLANT
PACK MINOR (CUSTOM PROCEDURE TRAY) IMPLANT
PAD ARMBOARD POSITIONER FOAM (MISCELLANEOUS) IMPLANT
POSITIONER HEAD 8X9X4 ADT (SOFTGOODS) ×1 IMPLANT
SET BASIN LINEN APH (SET/KITS/TRAYS/PACK) ×1 IMPLANT
SOLN STERILE WATER BTL 1000 ML (IV SOLUTION) ×1 IMPLANT
SUT MNCRL AB 4-0 PS2 18 (SUTURE) ×1 IMPLANT
SUT VIC AB 3-0 SH 27X BRD (SUTURE) ×1 IMPLANT
SYR CONTROL 10ML LL (SYRINGE) IMPLANT

## 2024-02-24 NOTE — Op Note (Signed)
 Preoperative diagnosis: overactive bladder   Postoperative diagnosis: Same   Procedure: Placement of Axonics stage 2 and impedance check   Surgeon: Dr. [name]   Assistant: None   Antibiotics: Ancef    Drains: None   Indications: The patient is a 73yo with a hx of overactive bladder. He has Stage 1 Axonics 2 weeks ago and has been able to void normally since placement. After discussing treatment options he has elected to proceed with Axonics Stage 2 implantation   Procedure in detail: Prior to the procedure consent was obtained. The patient was brought to the operating room and a brief time out was completed to ensure correct patient, correct procedure and correct site. Preoperative antibiotics were given. Extra care was taken positioning the patient in a prone position. Usual skin preparation was utilized. The previous incision in the left upper buttock was opend and previous vicryl suture removed. We then brought the lead connector into the operative field. We then removed the silk suture exposing the screws holding the temporary lead. We then unscrewed the lead and removed it. We then attached the generator and battery to the lead and tightened the screws. We then placed the generator in the previous pocket in the left upper buttock. We closed the deep subcutaneous tissue with 2-0 vicryl in a running fashion. The incisions were then closed with running 4-0 monocryl.   Impedance check was done utilizing sterile technique and was normal in all 4 positions We then placed dermabond on the incisions and this concluded the procedure which was well tolerated by the patient.   Complications: none   Condition: stable, transferred to PACU   Plan: The patient is to be discharged home after he voids in the PACU. He is to followup in 4 weeks for wound check

## 2024-02-24 NOTE — Anesthesia Postprocedure Evaluation (Signed)
 Anesthesia Post Note  Patient: Christopher Conner  Procedure(s) Performed: INSERTION, SACRAL NERVE STIMULATOR, INTERSTIM, STAGE 2 (Back)  Patient location during evaluation: PACU Anesthesia Type: General Level of consciousness: awake and alert Pain management: pain level controlled Vital Signs Assessment: post-procedure vital signs reviewed and stable Respiratory status: spontaneous breathing, nonlabored ventilation and respiratory function stable Cardiovascular status: blood pressure returned to baseline and stable Postop Assessment: no apparent nausea or vomiting Anesthetic complications: no   There were no known notable events for this encounter.   Last Vitals:  Vitals:   02/24/24 1433 02/24/24 1449  BP: (!) 156/56 (!) 160/60  Pulse: (!) 59 64  Resp: 15 14  Temp:  36.5 C  SpO2: 99% 100%    Last Pain:  Vitals:   02/24/24 1449  TempSrc: Oral  PainSc: 0-No pain                 [name]

## 2024-02-24 NOTE — Interval H&P Note (Signed)
 History and Physical Interval Note:  02/24/2024 12:49 PM  Christopher Conner  has presented today for surgery, with the diagnosis of overactive bladder.  The various methods of treatment have been discussed with the patient and family. After consideration of risks, benefits and other options for treatment, the patient has consented to  Procedure(s) with comments: INSERTION, SACRAL NERVE STIMULATOR, INTERSTIM, STAGE 2 ([name]) - pt needs afternoon surgery time as a surgical intervention.  The patient's history has been reviewed, patient examined, no change in status, stable for surgery.  I have reviewed the patient's chart and labs.  Questions were answered to the patient's satisfaction.     [name]

## 2024-02-24 NOTE — Transfer of Care (Signed)
 Immediate Anesthesia Transfer of Care Note  Patient: Christopher Conner  Procedure(s) Performed: INSERTION, SACRAL NERVE STIMULATOR, INTERSTIM, STAGE 2 (Back)  Patient Location: PACU  Anesthesia Type:General  Level of Consciousness: awake  Airway & Oxygen Therapy: Patient Spontanous Breathing  Post-op Assessment: Report given to RN  Post vital signs: Reviewed and stable  Last Vitals:  Vitals Value Taken Time  BP 131/52 02/24/24 14:01  Temp    Pulse 58 02/24/24 14:04  Resp 17 02/24/24 14:04  SpO2 97 % 02/24/24 14:04  Vitals shown include unfiled device data.  Last Pain:  Vitals:   02/24/24 1255  TempSrc:   PainSc: 0-No pain      Patients Stated Pain Goal: 3 (02/24/24 1249)  Complications: No notable events documented.

## 2024-02-24 NOTE — Anesthesia Preprocedure Evaluation (Addendum)
 Anesthesia Evaluation  Patient identified by MRN, date of birth, ID band Patient awake    Reviewed: Allergy & Precautions, H&P , NPO status , Patient's Chart, lab work & pertinent test results, reviewed documented beta blocker date and time   Airway Mallampati: II  TM Distance: >3 FB Neck ROM: full    Dental no notable dental hx. (+) Dental Advisory Given, Teeth Intact   Pulmonary neg pulmonary ROS   Pulmonary exam normal breath sounds clear to auscultation       Cardiovascular Exercise Tolerance: Good hypertension, Normal cardiovascular exam+ Valvular Problems/Murmurs  Rhythm:regular Rate:Normal     Neuro/Psych Normal pressure hydrocephalus. Bilateral foot drop  Neuromuscular disease  negative psych ROS   GI/Hepatic negative GI ROS, Neg liver ROS,,,  Endo/Other  diabetes, Type 2    Renal/GU negative Renal ROS  negative genitourinary   Musculoskeletal  (+) Arthritis , Osteoarthritis,    Abdominal   Peds  Hematology  (+) Blood dyscrasia, anemia   Anesthesia Other Findings CLL  Reproductive/Obstetrics negative OB ROS                              Anesthesia Physical Anesthesia Plan  ASA: 3  Anesthesia Plan: General   Post-op Pain Management: Minimal or no pain anticipated   Induction: Intravenous  PONV Risk Score and Plan: Propofol  infusion  Airway Management Planned: Nasal Cannula and Natural Airway  Additional Equipment: None  Intra-op Plan:   Post-operative Plan:   Informed Consent: I have reviewed the patients History and Physical, chart, labs and discussed the procedure including the risks, benefits and alternatives for the proposed anesthesia with the patient or authorized representative who has indicated his/her understanding and acceptance.     Dental Advisory Given  Plan Discussed with: CRNA  Anesthesia Plan Comments:          Anesthesia Quick  Evaluation

## 2024-02-25 ENCOUNTER — Encounter (HOSPITAL_COMMUNITY): Payer: Self-pay | Admitting: Urology

## 2024-02-25 DIAGNOSIS — Z23 Encounter for immunization: Secondary | ICD-10-CM | POA: Diagnosis not present

## 2024-02-28 ENCOUNTER — Ambulatory Visit: Admitting: Urology

## 2024-02-28 ENCOUNTER — Encounter: Payer: Self-pay | Admitting: Urology

## 2024-03-01 ENCOUNTER — Encounter (HOSPITAL_COMMUNITY): Payer: Self-pay | Admitting: Urology

## 2024-03-04 ENCOUNTER — Other Ambulatory Visit: Payer: Self-pay | Admitting: Nurse Practitioner

## 2024-03-15 ENCOUNTER — Other Ambulatory Visit: Payer: Self-pay | Admitting: Nurse Practitioner

## 2024-03-21 ENCOUNTER — Encounter: Payer: Self-pay | Admitting: Nurse Practitioner

## 2024-03-21 ENCOUNTER — Other Ambulatory Visit: Payer: Self-pay

## 2024-03-21 DIAGNOSIS — Z5320 Procedure and treatment not carried out because of patient's decision for unspecified reasons: Secondary | ICD-10-CM

## 2024-03-22 ENCOUNTER — Encounter: Payer: Self-pay | Admitting: Urology

## 2024-03-22 ENCOUNTER — Ambulatory Visit: Admitting: Urology

## 2024-03-22 VITALS — BP 157/62 | HR 66

## 2024-03-22 DIAGNOSIS — R350 Frequency of micturition: Secondary | ICD-10-CM

## 2024-03-22 NOTE — Progress Notes (Unsigned)
 03/22/2024 1:59 PM   [name] 13-Jul-1950 [id number]  Referring provider: Oris Camie BRAVO, NP [address] [place],  [place] [address]  Followup nocturia and urinary frequency   HPI: [name] is a 73yo here for followup for urinary frequency and nocturia. Nocturia is 2-3x. He has large volume voids at night. His urinary urgency is unchanged. Urinary frequency is 2-4 hours during the day. No straining to urinate. Urine stream strong. He notes slight improvement in his bowel movements since having Axonics placed.    PMH: Past Medical History:  Diagnosis Date   Anemia 07/15/2023   Arthritis    Bilateral foot-drop    after back surgery   BPH (benign prostatic hyperplasia)    Cataract    Chronic lymphocytic leukemia (HCC) early 2018   no current treatment   CLL (chronic lymphocytic leukemia) (HCC)    DM type 1 (diabetes mellitus, type 1) (HCC)    type 1   Goals of care, counseling/discussion 07/18/2023   High cholesterol    Hypertension    Murmur, cardiac 07/15/2023   Neuromuscular disorder (HCC)    diabetic neuropathy   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   Scattered rhonchi of right lung 11/22/2022    Surgical History: Past Surgical History:  Procedure Laterality Date   APPENDECTOMY  early 20's   BACK SURGERY     BRAIN SURGERY  10/30/04   brain shunt, [facility]   CATARACT EXTRACTION Bilateral    left eye prostheisi shell   CYST REMOVAL NECK  yrs ago   CYSTOSCOPY [name] 04/22/2023   Procedure: CYSTOSCOPY;  Surgeon: Sherrilee Belvie CROME, MD;  Location: [facility];  Service: Urology;  Laterality: [name];   CYSTOSCOPY WITH INJECTION [name] 12/17/2022   Procedure: CYSTOSCOPY WITH INJECTION-Botox ;  Surgeon: Sherrilee Belvie CROME, MD;  Location: [facility];  Service: Urology;  Laterality: [name];   CYSTOSCOPY WITH INSERTION OF UROLIFT  april/march 2020   done in office   CYSTOSCOPY WITH INSERTION OF UROLIFT [name] 10/12/2019   Procedure: CYSTOSCOPY  WITH INSERTION OF UROLIFT;  Surgeon: Sherrilee Belvie CROME, MD;  Location: [facility];  Service: Urology;  Laterality: [name];   EYE SURGERY     INTERSTIM IMPLANT PLACEMENT [name] 02/10/2024   Procedure: INSERTION, SACRAL NERVE STIMULATOR, INTERSTIM, STAGE 1;  Surgeon: Sherrilee Belvie CROME, MD;  Location: [facility];  Service: Urology;  Laterality: [name];   INTERSTIM IMPLANT PLACEMENT [name] 02/24/2024   Procedure: INSERTION, SACRAL NERVE STIMULATOR, INTERSTIM, STAGE 2;  Surgeon: Sherrilee Belvie CROME, MD;  Location: [facility];  Service: Urology;  Laterality: [name];   LEFT HEART CATH AND CORONARY ANGIOGRAPHY [name] 10/05/2023   Procedure: LEFT HEART CATH AND CORONARY ANGIOGRAPHY;  Surgeon: Jordan, Peter M, MD;  Location: [facility];  Service: Cardiovascular;  Laterality: [name];   LUMBAR FUSION  1998 and 2000   l5 to s1 1998, then l4 and l5 in 2000   SPINE SURGERY     H. Elsner, L4-5 & L5-S1   TRANSURETHRAL RESECTION OF PROSTATE [name] 04/22/2023   Procedure: TRANSURETHRAL RESECTION OF THE PROSTATE (TURP);  Surgeon: Sherrilee Belvie CROME, MD;  Location: [facility];  Service: Urology;  Laterality: [name];   VENTRICULOPERITONEAL SHUNT  2006    Home Medications:  Allergies as of 03/22/2024       Reactions   Lisinopril  Cough   Latex Itching, Rash        Medication List        Accurate  as of March 22, 2024  1:59 PM. If you have any questions, ask your nurse or doctor.          ACETYL L-CARNITINE PO Take 500 mg by mouth in the morning.   acidophilus Caps capsule Take 1 capsule by mouth every evening.   amLODipine  10 MG tablet Commonly known as: NORVASC  TAKE 1 TABLET BY MOUTH EVERY MORNING   ammonium lactate  12 % lotion Commonly known as: Amlactin Daily Apply 1 Application topically as needed.   aspirin  EC 81 MG tablet Take 1 tablet (81 mg total) by mouth daily. Swallow whole.   atorvastatin  80 MG tablet Commonly known as: LIPITOR  Take 1 tablet (80 mg total) by mouth every evening.    B-100 PO Take 1 tablet by mouth in the morning.   carvedilol  3.125 MG tablet Commonly known as: COREG  TAKE 1 TABLET BY MOUTH 2 TIMES A DAY WITH A MEAL   CoQ-10 100 MG Caps Take 100 mg by mouth daily.   folic acid 1 MG tablet Commonly known as: FOLVITE Take 1 mg by mouth daily.   hydrOXYzine  10 MG tablet Commonly known as: ATARAX  Take 1 tablet (10 mg total) by mouth at bedtime.   methotrexate 2.5 MG tablet Commonly known as: RHEUMATREX Take 20 mg by mouth every Saturday.   multivitamin with minerals Tabs tablet Take 1 tablet by mouth every evening.   nitroGLYCERIN  0.4 MG SL tablet Commonly known as: NITROSTAT  Place 1 tablet (0.4 mg total) under the tongue every 5 (five) minutes as needed for chest pain.   NovoLOG  100 UNIT/ML injection Generic drug: insulin  aspart INJECT UP TO 100 UNITS DAILY MAXIMUM DOSE WITH INSULIN  PUMP DAILY   OMEGA 3 PO Take 1 capsule by mouth daily.   pantoprazole  40 MG tablet Commonly known as: PROTONIX  Take 40 mg by mouth daily.   REFRESH OP Place 1-2 drops into the right eye every 2 (two) hours as needed (dryness).   sulfamethoxazole -trimethoprim  800-160 MG tablet Commonly known as: BACTRIM  DS Take 1 tablet by mouth 2 (two) times daily.   telmisartan  40 MG tablet Commonly known as: MICARDIS  Take 1 tablet (40 mg total) by mouth in the morning.   Vitamin D 50 MCG (2000 UT) Caps Take 2,000 Units by mouth in the morning.        Allergies:  Allergies  Allergen Reactions   Lisinopril  Cough   Latex Itching and Rash    Family History: Family History  Problem Relation Age of Onset   Hypertension Mother    Hyperlipidemia Mother    Diabetes Mother    Diabetes Father     Social History:  reports that he has never smoked. He has never used smokeless tobacco. He reports that he does not drink alcohol and does not use drugs.  ROS: All other review of systems were reviewed and are negative except what is noted above in  HPI  Physical Exam: BP (!) 157/62   Pulse 66   Constitutional:  Alert and oriented, No acute distress. HEENT: Rosalie AT, moist mucus membranes.  Trachea midline, no masses. Cardiovascular: No clubbing, cyanosis, or edema. Respiratory: Normal respiratory effort, no increased work of breathing. GI: Abdomen is soft, nontender, nondistended, no abdominal masses GU: No CVA tenderness.  Lymph: No cervical or inguinal lymphadenopathy. Skin: No rashes, bruises or suspicious lesions. Neurologic: Grossly intact, no focal deficits, moving all 4 extremities. Psychiatric: Normal mood and affect.  Laboratory Data: Lab Results  Component Value Date   WBC 17.0 (  H) 02/07/2024   HGB 12.8 (L) 02/07/2024   HCT 37.5 (L) 02/07/2024   MCV 99.7 02/07/2024   PLT 280 02/07/2024    Lab Results  Component Value Date   CREATININE 0.66 02/07/2024    No results found for: PSA  No results found for: TESTOSTERONE  Lab Results  Component Value Date   HGBA1C 5.8 (H) 02/07/2024    Urinalysis    Component Value Date/Time   COLORURINE YELLOW 08/16/2015 0244   APPEARANCEUR Clear 12/13/2023 1400   LABSPEC 1.029 08/16/2015 0244   PHURINE 5.5 08/16/2015 0244   GLUCOSEU Negative 12/13/2023 1400   HGBUR NEGATIVE 08/16/2015 0244   BILIRUBINUR Negative 12/13/2023 1400   KETONESUR >80 (A) 08/16/2015 0244   PROTEINUR Negative 12/13/2023 1400   PROTEINUR NEGATIVE 08/16/2015 0244   UROBILINOGEN 0.2 04/09/2014 1947   NITRITE Negative 12/13/2023 1400   NITRITE NEGATIVE 08/16/2015 0244   LEUKOCYTESUR Negative 12/13/2023 1400    Lab Results  Component Value Date   LABMICR Comment 12/13/2023   WBCUA >30 (A) 06/02/2023   LABEPIT 0-10 06/02/2023   BACTERIA Few (A) 06/02/2023    Pertinent Imaging:  Results for orders placed during the hospital encounter of 05/23/10  DG Abd 1 View  Narrative *RADIOLOGY REPORT*  Clinical Data: Weakness with nausea and vomiting.  ABDOMEN - 1 VIEW  Comparison:  02/10/2010.  Findings: Ventriculoperitoneal shunt tubing extends into the right mid abdomen.  There is no shunt discontinuity.  Moderate stool is noted throughout the colon.  The bowel gas pattern is nonobstructive.  There are postsurgical changes of the lower lumbar spine status post fusion.  IMPRESSION:  1.  Moderate stool throughout the colon suggesting constipation. 2.  Intact ventricular peritoneal shunt.  Original Report Authenticated By: ELSIE WENDI PERONE, M.D.  No results found for this or any previous visit.  No results found for this or any previous visit.  No results found for this or any previous visit.  No results found for this or any previous visit.  No results found for this or any previous visit.  No results found for this or any previous visit.  No results found for this or any previous visit.   Assessment & Plan:    1. Urinary frequency (Primary) Followup 3-4 months   No follow-ups on file.  [name], MD  [facility] [place]

## 2024-03-22 NOTE — Patient Instructions (Signed)

## 2024-03-29 DIAGNOSIS — L218 Other seborrheic dermatitis: Secondary | ICD-10-CM | POA: Diagnosis not present

## 2024-03-29 DIAGNOSIS — Z85828 Personal history of other malignant neoplasm of skin: Secondary | ICD-10-CM | POA: Diagnosis not present

## 2024-03-29 DIAGNOSIS — L570 Actinic keratosis: Secondary | ICD-10-CM | POA: Diagnosis not present

## 2024-03-29 DIAGNOSIS — Z08 Encounter for follow-up examination after completed treatment for malignant neoplasm: Secondary | ICD-10-CM | POA: Diagnosis not present

## 2024-04-09 ENCOUNTER — Other Ambulatory Visit: Payer: Self-pay | Admitting: Nurse Practitioner

## 2024-05-15 ENCOUNTER — Inpatient Hospital Stay

## 2024-05-15 ENCOUNTER — Inpatient Hospital Stay: Admitting: Hematology

## 2024-05-17 ENCOUNTER — Other Ambulatory Visit: Payer: Self-pay

## 2024-05-17 DIAGNOSIS — C9110 Chronic lymphocytic leukemia of B-cell type not having achieved remission: Secondary | ICD-10-CM

## 2024-05-18 ENCOUNTER — Inpatient Hospital Stay (HOSPITAL_BASED_OUTPATIENT_CLINIC_OR_DEPARTMENT_OTHER): Admitting: Hematology

## 2024-05-18 ENCOUNTER — Inpatient Hospital Stay: Attending: Hematology

## 2024-05-18 VITALS — BP 129/51 | HR 55 | Temp 97.7°F | Resp 18 | Wt 178.7 lb

## 2024-05-18 DIAGNOSIS — C9110 Chronic lymphocytic leukemia of B-cell type not having achieved remission: Secondary | ICD-10-CM | POA: Diagnosis present

## 2024-05-18 LAB — CMP (CANCER CENTER ONLY)
ALT: 44 U/L (ref 0–44)
AST: 49 U/L — ABNORMAL HIGH (ref 15–41)
Albumin: 4.2 g/dL (ref 3.5–5.0)
Alkaline Phosphatase: 72 U/L (ref 38–126)
Anion gap: 10 (ref 5–15)
BUN: 18 mg/dL (ref 8–23)
CO2: 25 mmol/L (ref 22–32)
Calcium: 8.9 mg/dL (ref 8.9–10.3)
Chloride: 90 mmol/L — ABNORMAL LOW (ref 98–111)
Creatinine: 0.68 mg/dL (ref 0.61–1.24)
GFR, Estimated: >60 mL/min
Glucose, Bld: 59 mg/dL — ABNORMAL LOW (ref 70–99)
Potassium: 4.6 mmol/L (ref 3.5–5.1)
Sodium: 126 mmol/L — ABNORMAL LOW (ref 135–145)
Total Bilirubin: 0.4 mg/dL (ref 0.0–1.2)
Total Protein: 6.4 g/dL — ABNORMAL LOW (ref 6.5–8.1)

## 2024-05-18 LAB — CBC WITH DIFFERENTIAL (CANCER CENTER ONLY)
Abs Immature Granulocytes: 0.02 10*3/uL (ref 0.00–0.07)
Basophils Absolute: 0 10*3/uL (ref 0.0–0.1)
Basophils Relative: 0 %
Eosinophils Absolute: 0.1 10*3/uL (ref 0.0–0.5)
Eosinophils Relative: 1 %
HCT: 34 % — ABNORMAL LOW (ref 39.0–52.0)
Hemoglobin: 11.9 g/dL — ABNORMAL LOW (ref 13.0–17.0)
Immature Granulocytes: 0 %
Lymphocytes Relative: 64 %
Lymphs Abs: 10.3 10*3/uL — ABNORMAL HIGH (ref 0.7–4.0)
MCH: 33.8 pg (ref 26.0–34.0)
MCHC: 35 g/dL (ref 30.0–36.0)
MCV: 96.6 fL (ref 80.0–100.0)
Monocytes Absolute: 0.5 10*3/uL (ref 0.1–1.0)
Monocytes Relative: 3 %
Neutro Abs: 5.2 10*3/uL (ref 1.7–7.7)
Neutrophils Relative %: 32 %
Platelet Count: 283 10*3/uL (ref 150–400)
RBC: 3.52 MIL/uL — ABNORMAL LOW (ref 4.22–5.81)
RDW: 12.3 % (ref 11.5–15.5)
Smear Review: NORMAL
WBC Count: 16.1 10*3/uL — ABNORMAL HIGH (ref 4.0–10.5)
nRBC: 0 % (ref 0.0–0.2)

## 2024-05-18 LAB — LACTATE DEHYDROGENASE: LDH: 294 U/L — ABNORMAL HIGH (ref 105–235)

## 2024-05-19 ENCOUNTER — Telehealth: Payer: Self-pay | Admitting: Cardiology

## 2024-05-19 NOTE — Telephone Encounter (Signed)
" °  Per MyChart scheduling message:  significant swelling & retention in lower & upper legs, right arm has a slight numbing feeling almost every night that feels like it has been laid upon but it is sometimes before I'm in bed  "

## 2024-05-19 NOTE — Telephone Encounter (Signed)
 Pt returning nurses call

## 2024-05-19 NOTE — Telephone Encounter (Signed)
 Patient reports swelling in both legs up to his waist. No improvement with compression socks. He reports he is also dealing with urinary issues, up at night urinating 3-5 times.   Swelling does improve some overnight with increased urination.  SOB with activity, mainly when walking up and down driveway, or up to [facility].  He is not taking any diuretics. Swelling has been present over the last couple of months.  Scheduled appt with Dr. Pietro for 05/22/24. Patient will call to reschedule if weather impedes his ability to make it to appt Monday. Advised on ED precautions and informed him of on-call provider availability for the weekend if symptoms worsen.  Will forward to Dr. Pietro to review for further advisement.

## 2024-05-19 NOTE — Telephone Encounter (Signed)
Left message for patient to call back, also sent MyChart message

## 2024-05-22 ENCOUNTER — Encounter: Payer: Self-pay | Admitting: Cardiology

## 2024-05-22 ENCOUNTER — Ambulatory Visit: Admitting: Cardiology

## 2024-05-22 VITALS — BP 142/62 | HR 57 | Ht 69.0 in | Wt 177.8 lb

## 2024-05-22 DIAGNOSIS — I10 Essential (primary) hypertension: Secondary | ICD-10-CM

## 2024-05-22 DIAGNOSIS — I5033 Acute on chronic diastolic (congestive) heart failure: Secondary | ICD-10-CM

## 2024-05-22 DIAGNOSIS — I340 Nonrheumatic mitral (valve) insufficiency: Secondary | ICD-10-CM | POA: Diagnosis not present

## 2024-05-22 DIAGNOSIS — I351 Nonrheumatic aortic (valve) insufficiency: Secondary | ICD-10-CM | POA: Diagnosis not present

## 2024-05-22 DIAGNOSIS — E785 Hyperlipidemia, unspecified: Secondary | ICD-10-CM | POA: Diagnosis not present

## 2024-05-22 DIAGNOSIS — I2510 Atherosclerotic heart disease of native coronary artery without angina pectoris: Secondary | ICD-10-CM

## 2024-05-22 MED ORDER — FUROSEMIDE 40 MG PO TABS: 40.0000 mg | ORAL_TABLET | Freq: Every day | ORAL | 3 refills | Status: AC

## 2024-05-22 NOTE — Telephone Encounter (Signed)
See office visit notes from today.

## 2024-05-22 NOTE — Patient Instructions (Signed)
 Medication Instructions:   Start furosemide  40 mg once daily  *If you need a refill on your cardiac medications before your next appointment, please call your pharmacy*  Lab Work:  Your physician recommends that you return for lab work in: 3 days  If you have labs (blood work) drawn today and your tests are completely normal, you will receive your results only by: MyChart Message (if you have MyChart) OR A paper copy in the mail If you have any lab test that is abnormal or we need to change your treatment, we will call you to review the results.  Testing/Procedures:  Your physician has requested that you have an echocardiogram. Echocardiography is a painless test that uses sound waves to create images of your heart. It provides your doctor with information about the size and shape of your heart and how well your hearts chambers and valves are working. This procedure takes approximately one hour. There are no restrictions for this procedure. Please do NOT wear cologne, perfume, aftershave, or lotions (deodorant is allowed). Please arrive 15 minutes prior to your appointment time.  Please note: We ask at that you not bring children with you during ultrasound (echo/ vascular) testing. Due to room size and safety concerns, children are not allowed in the ultrasound rooms during exams. Our front office staff cannot provide observation of children in our lobby area while testing is being conducted. An adult accompanying a patient to their appointment will only be allowed in the ultrasound room at the discretion of the ultrasound technician under special circumstances. We apologize for any inconvenience. [address]  Follow-Up: At [facility], you and your health needs are our priority.  As part of our continuing mission to provide you with exceptional heart care, our providers are all part of one team.  This team includes your primary Cardiologist (physician) and Advanced Practice  Providers or APPs (Physician Assistants and Nurse Practitioners) who all work together to provide you with the care you need, when you need it.  Your next appointment:   3 week(s)  Provider:   Jon Hails, PA-C, Dayna Dunn, PA-C, Callie Goodrich, PA-C, Kathleen Johnson, PA-C, Michele Lenze, PA-C, Hao Meng, PA-C, Damien Braver, NP, Glendia Ferrier, PA-C, or Katlyn West, NP         Then, Redell Shallow, MD will plan to see you again in 3 month(s).

## 2024-05-23 ENCOUNTER — Telehealth: Payer: Self-pay | Admitting: Cardiology

## 2024-05-23 NOTE — Telephone Encounter (Signed)
 Pt took furosemide  today at 8:45a and is concerned he has not had any increase in urine. He also has some mild diarrhea. Please advise.

## 2024-05-26 LAB — BASIC METABOLIC PANEL WITH GFR
Chloride: 93 mmol/L — ABNORMAL LOW (ref 96–106)
Sodium: 127 mmol/L — ABNORMAL LOW (ref 134–144)

## 2024-05-26 LAB — PRO B NATRIURETIC PEPTIDE

## 2024-06-06 ENCOUNTER — Encounter (HOSPITAL_COMMUNITY)

## 2024-06-09 ENCOUNTER — Inpatient Hospital Stay: Admitting: Hematology

## 2024-06-19 ENCOUNTER — Ambulatory Visit (HOSPITAL_COMMUNITY)

## 2024-06-21 ENCOUNTER — Inpatient Hospital Stay: Payer: Medicare Other

## 2024-06-21 ENCOUNTER — Inpatient Hospital Stay: Payer: Medicare Other | Admitting: Hematology

## 2024-06-26 ENCOUNTER — Ambulatory Visit: Admitting: Urology

## 2024-07-11 ENCOUNTER — Ambulatory Visit: Payer: Self-pay

## 2024-09-15 ENCOUNTER — Ambulatory Visit: Admitting: Cardiology
